# Patient Record
Sex: Male | Born: 1960
Health system: Southern US, Community
[De-identification: ages and names within clinical notes are randomized; demographics above are authoritative.]

## PROBLEM LIST (undated history)

## (undated) ENCOUNTER — Emergency Department (HOSPITAL_BASED_OUTPATIENT_CLINIC_OR_DEPARTMENT_OTHER): Disposition: A | Discharge status: Home / Self Care | Payer: No Typology Code available for payment source

## (undated) DIAGNOSIS — E119 Type 2 diabetes mellitus without complications: Secondary | ICD-10-CM

## (undated) DIAGNOSIS — M1990 Unspecified osteoarthritis, unspecified site: Secondary | ICD-10-CM

## (undated) DIAGNOSIS — K0889 Other specified disorders of teeth and supporting structures: Secondary | ICD-10-CM

## (undated) DIAGNOSIS — R51 Headache: Secondary | ICD-10-CM

## (undated) DIAGNOSIS — K219 Gastro-esophageal reflux disease without esophagitis: Secondary | ICD-10-CM

## (undated) DIAGNOSIS — I639 Cerebral infarction, unspecified: Secondary | ICD-10-CM

## (undated) DIAGNOSIS — D689 Coagulation defect, unspecified: Secondary | ICD-10-CM

## (undated) DIAGNOSIS — R519 Headache, unspecified: Secondary | ICD-10-CM

## (undated) DIAGNOSIS — I10 Essential (primary) hypertension: Secondary | ICD-10-CM

## (undated) HISTORY — DX: Type 2 diabetes mellitus without complications: E11.9

## (undated) HISTORY — DX: Coagulation defect, unspecified: D68.9

---

## 2012-11-11 DIAGNOSIS — I639 Cerebral infarction, unspecified: Secondary | ICD-10-CM

## 2012-11-11 HISTORY — DX: Cerebral infarction, unspecified: I63.9

## 2012-12-03 ENCOUNTER — Encounter (HOSPITAL_COMMUNITY): Payer: Self-pay | Admitting: Critical Care Medicine

## 2012-12-03 ENCOUNTER — Encounter (HOSPITAL_COMMUNITY): Payer: Self-pay | Admitting: *Deleted

## 2012-12-03 ENCOUNTER — Inpatient Hospital Stay (HOSPITAL_COMMUNITY): Payer: No Typology Code available for payment source

## 2012-12-03 ENCOUNTER — Inpatient Hospital Stay (HOSPITAL_COMMUNITY)
Admission: EM | Admit: 2012-12-03 | Discharge: 2012-12-17 | DRG: 020 | Disposition: A | Discharge status: Rehabilitation Facility | Payer: No Typology Code available for payment source | Attending: Neurosurgery | Admitting: Neurosurgery

## 2012-12-03 ENCOUNTER — Encounter (HOSPITAL_COMMUNITY)
Admission: EM | Disposition: A | Discharge status: Rehabilitation Facility | Payer: Self-pay | Source: Home / Self Care | Attending: Neurosurgery

## 2012-12-03 ENCOUNTER — Encounter (HOSPITAL_COMMUNITY): Payer: Self-pay | Admitting: Anesthesiology

## 2012-12-03 ENCOUNTER — Emergency Department (HOSPITAL_COMMUNITY): Payer: No Typology Code available for payment source

## 2012-12-03 ENCOUNTER — Inpatient Hospital Stay (HOSPITAL_COMMUNITY): Payer: No Typology Code available for payment source | Admitting: Critical Care Medicine

## 2012-12-03 DIAGNOSIS — J9600 Acute respiratory failure, unspecified whether with hypoxia or hypercapnia: Secondary | ICD-10-CM | POA: Diagnosis present

## 2012-12-03 DIAGNOSIS — J95821 Acute postprocedural respiratory failure: Secondary | ICD-10-CM | POA: Diagnosis not present

## 2012-12-03 DIAGNOSIS — E876 Hypokalemia: Secondary | ICD-10-CM | POA: Diagnosis not present

## 2012-12-03 DIAGNOSIS — F17200 Nicotine dependence, unspecified, uncomplicated: Secondary | ICD-10-CM | POA: Diagnosis present

## 2012-12-03 DIAGNOSIS — I619 Nontraumatic intracerebral hemorrhage, unspecified: Secondary | ICD-10-CM | POA: Diagnosis present

## 2012-12-03 DIAGNOSIS — D696 Thrombocytopenia, unspecified: Secondary | ICD-10-CM | POA: Diagnosis not present

## 2012-12-03 DIAGNOSIS — I10 Essential (primary) hypertension: Secondary | ICD-10-CM | POA: Diagnosis present

## 2012-12-03 DIAGNOSIS — I671 Cerebral aneurysm, nonruptured: Secondary | ICD-10-CM | POA: Diagnosis present

## 2012-12-03 DIAGNOSIS — R739 Hyperglycemia, unspecified: Secondary | ICD-10-CM | POA: Diagnosis not present

## 2012-12-03 DIAGNOSIS — I602 Nontraumatic subarachnoid hemorrhage from anterior communicating artery: Secondary | ICD-10-CM | POA: Diagnosis present

## 2012-12-03 DIAGNOSIS — I609 Nontraumatic subarachnoid hemorrhage, unspecified: Principal | ICD-10-CM | POA: Diagnosis present

## 2012-12-03 DIAGNOSIS — R4182 Altered mental status, unspecified: Secondary | ICD-10-CM | POA: Diagnosis not present

## 2012-12-03 DIAGNOSIS — R7309 Other abnormal glucose: Secondary | ICD-10-CM | POA: Diagnosis not present

## 2012-12-03 DIAGNOSIS — G910 Communicating hydrocephalus: Secondary | ICD-10-CM | POA: Diagnosis present

## 2012-12-03 DIAGNOSIS — E871 Hypo-osmolality and hyponatremia: Secondary | ICD-10-CM | POA: Diagnosis not present

## 2012-12-03 HISTORY — PX: RADIOLOGY WITH ANESTHESIA: SHX6223

## 2012-12-03 HISTORY — DX: Essential (primary) hypertension: I10

## 2012-12-03 LAB — COMPREHENSIVE METABOLIC PANEL
ALT: 18 U/L (ref 0–53)
AST: 21 U/L (ref 0–37)
Albumin: 3.9 g/dL (ref 3.5–5.2)
Alkaline Phosphatase: 91 U/L (ref 39–117)
BUN: 23 mg/dL (ref 6–23)
CO2: 23 meq/L (ref 19–32)
Calcium: 9.2 mg/dL (ref 8.4–10.5)
Chloride: 106 meq/L (ref 96–112)
Creatinine, Ser: 0.68 mg/dL (ref 0.50–1.35)
GFR calc Af Amer: >90 mL/min (ref >90)
GFR calc non Af Amer: >90 mL/min (ref >90)
Glucose, Bld: 157 mg/dL — ABNORMAL HIGH (ref 70–99)
Potassium: 3.9 meq/L (ref 3.5–5.1)
Sodium: 142 mEq/L (ref 135–145)
Total Bilirubin: 0.7 mg/dL (ref 0.3–1.2)
Total Protein: 6.8 g/dL (ref 6.0–8.3)

## 2012-12-03 LAB — CBC WITH DIFFERENTIAL/PLATELET
Basophils Absolute: 0.1 10*3/uL (ref 0.0–0.1)
Basophils Relative: 1 % (ref 0–1)
Eosinophils Absolute: 0.3 10*3/uL (ref 0.0–0.7)
Eosinophils Relative: 4 % (ref 0–5)
HCT: 53.6 % — ABNORMAL HIGH (ref 39.0–52.0)
Hemoglobin: 19.1 g/dL — ABNORMAL HIGH (ref 13.0–17.0)
Lymphocytes Relative: 26 % (ref 12–46)
Lymphs Abs: 2.4 10*3/uL (ref 0.7–4.0)
MCH: 33.3 pg (ref 26.0–34.0)
MCHC: 35.6 g/dL (ref 30.0–36.0)
MCV: 93.5 fL (ref 78.0–100.0)
Monocytes Absolute: 0.9 10*3/uL (ref 0.1–1.0)
Monocytes Relative: 10 % (ref 3–12)
Neutro Abs: 5.5 10*3/uL (ref 1.7–7.7)
Neutrophils Relative %: 59 % (ref 43–77)
Platelets: 164 10*3/uL (ref 150–400)
RBC: 5.73 MIL/uL (ref 4.22–5.81)
RDW: 13 % (ref 11.5–15.5)
WBC: 9.2 10*3/uL (ref 4.0–10.5)

## 2012-12-03 LAB — BLOOD GAS, ARTERIAL
Acid-Base Excess: 0.8 mmol/L (ref 0.0–2.0)
Bicarbonate: 26.9 mEq/L — ABNORMAL HIGH (ref 20.0–24.0)
Drawn by: 331001
FIO2: 1 %
MECHVT: 640 mL
O2 Saturation: 100 %
PEEP: 5 cmH2O
Patient temperature: 95
RATE: 14 {breaths}/min
TCO2: 28.7 mmol/L (ref 0–100)
pCO2 arterial: 54.4 mm[Hg] — ABNORMAL HIGH (ref 35.0–45.0)
pH, Arterial: 7.302 — ABNORMAL LOW (ref 7.350–7.450)
pO2, Arterial: 455 mm[Hg] — ABNORMAL HIGH (ref 80.0–100.0)

## 2012-12-03 LAB — PROTIME-INR
INR: 0.97 (ref 0.00–1.49)
Prothrombin Time: 12.8 s (ref 11.6–15.2)

## 2012-12-03 LAB — RAPID URINE DRUG SCREEN, HOSP PERFORMED
Amphetamines: NOT DETECTED
Barbiturates: NOT DETECTED
Benzodiazepines: NOT DETECTED
Cocaine: NOT DETECTED
Opiates: POSITIVE — AB
Tetrahydrocannabinol: NOT DETECTED

## 2012-12-03 LAB — URINE MICROSCOPIC-ADD ON

## 2012-12-03 LAB — POCT I-STAT, CHEM 8
BUN: 23 mg/dL (ref 6–23)
Calcium, Ion: 1.11 mmol/L — ABNORMAL LOW (ref 1.12–1.23)
Chloride: 108 mEq/L (ref 96–112)
Creatinine, Ser: 0.9 mg/dL (ref 0.50–1.35)
Glucose, Bld: 146 mg/dL — ABNORMAL HIGH (ref 70–99)
HCT: 57 % — ABNORMAL HIGH (ref 39.0–52.0)
Hemoglobin: 19.4 g/dL — ABNORMAL HIGH (ref 13.0–17.0)
Potassium: 3.9 meq/L (ref 3.5–5.1)
Sodium: 142 meq/L (ref 135–145)
TCO2: 29 mmol/L (ref 0–100)

## 2012-12-03 LAB — URINALYSIS, ROUTINE W REFLEX MICROSCOPIC
Glucose, UA: NEGATIVE mg/dL
Hgb urine dipstick: NEGATIVE
Ketones, ur: 15 mg/dL — AB
Nitrite: NEGATIVE
Protein, ur: 100 mg/dL — AB
Specific Gravity, Urine: 1.028 (ref 1.005–1.030)
Urobilinogen, UA: 1 mg/dL (ref 0.0–1.0)
pH: 7 (ref 5.0–8.0)

## 2012-12-03 LAB — MRSA PCR SCREENING: MRSA by PCR: NEGATIVE

## 2012-12-03 LAB — POCT I-STAT TROPONIN I: Troponin i, poc: 0 ng/mL (ref 0.00–0.08)

## 2012-12-03 LAB — APTT: aPTT: 25 seconds (ref 24–37)

## 2012-12-03 LAB — ETHANOL: Alcohol, Ethyl (B): <11 mg/dL (ref 0–11)

## 2012-12-03 LAB — GLUCOSE, CAPILLARY: Glucose-Capillary: 167 mg/dL — ABNORMAL HIGH (ref 70–99)

## 2012-12-03 SURGERY — RADIOLOGY WITH ANESTHESIA
Anesthesia: General

## 2012-12-03 MED ORDER — MIDAZOLAM HCL 2 MG/2ML IJ SOLN
INTRAMUSCULAR | Status: AC
  Filled 2012-12-03: qty 2

## 2012-12-03 MED ORDER — LABETALOL HCL 5 MG/ML IV SOLN
10.0000 mg | INTRAVENOUS | Status: DC | PRN
  Administered 2012-12-04 – 2012-12-06 (×3): 20 mg via INTRAVENOUS
  Filled 2012-12-03 (×3): qty 4

## 2012-12-03 MED ORDER — ACETAMINOPHEN 325 MG PO TABS: 650.0000 mg | ORAL_TABLET | ORAL | Status: DC | PRN

## 2012-12-03 MED ORDER — FENTANYL BOLUS VIA INFUSION
25.0000 ug | Freq: Four times a day (QID) | INTRAVENOUS | Status: DC | PRN
  Filled 2012-12-03: qty 100

## 2012-12-03 MED ORDER — ACETAMINOPHEN 650 MG RE SUPP
650.0000 mg | RECTAL | Status: DC | PRN
  Filled 2012-12-03: qty 1

## 2012-12-03 MED ORDER — ONDANSETRON HCL 4 MG/2ML IJ SOLN: 4.0000 mg | Freq: Four times a day (QID) | INTRAMUSCULAR | Status: DC | PRN

## 2012-12-03 MED ORDER — MORPHINE SULFATE 2 MG/ML IJ SOLN
1.0000 mg | INTRAMUSCULAR | Status: DC | PRN
  Administered 2012-12-04 – 2012-12-05 (×2): 2 mg via INTRAVENOUS
  Administered 2012-12-05: 4 mg via INTRAVENOUS
  Administered 2012-12-05: 2 mg via INTRAVENOUS
  Administered 2012-12-05 (×2): 4 mg via INTRAVENOUS
  Administered 2012-12-05: 2 mg via INTRAVENOUS
  Administered 2012-12-05: 4 mg via INTRAVENOUS
  Administered 2012-12-06 (×5): 2 mg via INTRAVENOUS
  Administered 2012-12-06: 4 mg via INTRAVENOUS
  Administered 2012-12-06: 2 mg via INTRAVENOUS
  Administered 2012-12-06: 4 mg via INTRAVENOUS
  Administered 2012-12-07 (×3): 2 mg via INTRAVENOUS
  Administered 2012-12-07: 4 mg via INTRAVENOUS
  Administered 2012-12-07: 2 mg via INTRAVENOUS
  Administered 2012-12-07: 4 mg via INTRAVENOUS
  Administered 2012-12-07 – 2012-12-08 (×3): 2 mg via INTRAVENOUS
  Administered 2012-12-08 (×2): 4 mg via INTRAVENOUS
  Administered 2012-12-09: 2 mg via INTRAVENOUS
  Administered 2012-12-09 (×2): 4 mg via INTRAVENOUS
  Administered 2012-12-09: 2 mg via INTRAVENOUS
  Filled 2012-12-03: qty 1
  Filled 2012-12-03: qty 2
  Filled 2012-12-03: qty 1
  Filled 2012-12-03: qty 2
  Filled 2012-12-03: qty 1
  Filled 2012-12-03 (×3): qty 2
  Filled 2012-12-03: qty 1
  Filled 2012-12-03: qty 2
  Filled 2012-12-03 (×10): qty 1
  Filled 2012-12-03: qty 2
  Filled 2012-12-03: qty 1
  Filled 2012-12-03: qty 2
  Filled 2012-12-03 (×2): qty 1
  Filled 2012-12-03 (×3): qty 2
  Filled 2012-12-03 (×2): qty 1
  Filled 2012-12-03 (×2): qty 2
  Filled 2012-12-03: qty 1

## 2012-12-03 MED ORDER — FENTANYL CITRATE 0.05 MG/ML IJ SOLN
INTRAMUSCULAR | Status: AC
  Administered 2012-12-03: 100 ug
  Filled 2012-12-03: qty 2

## 2012-12-03 MED ORDER — CHLORHEXIDINE GLUCONATE 0.12 % MT SOLN
15.0000 mL | Freq: Two times a day (BID) | OROMUCOSAL | Status: DC
  Administered 2012-12-03 – 2012-12-05 (×5): 15 mL via OROMUCOSAL
  Filled 2012-12-03 (×5): qty 15

## 2012-12-03 MED ORDER — SODIUM CHLORIDE 0.9 % IV SOLN
INTRAVENOUS | Status: DC | PRN
  Administered 2012-12-03 (×2): via INTRAVENOUS

## 2012-12-03 MED ORDER — LIDOCAINE HCL (CARDIAC) 20 MG/ML IV SOLN
INTRAVENOUS | Status: AC
  Filled 2012-12-03: qty 5

## 2012-12-03 MED ORDER — SODIUM CHLORIDE 0.9 % IV SOLN: INTRAVENOUS | Status: DC

## 2012-12-03 MED ORDER — ONDANSETRON HCL 4 MG/2ML IJ SOLN
4.0000 mg | Freq: Once | INTRAMUSCULAR | Status: AC
  Administered 2012-12-03: 4 mg via INTRAVENOUS
  Filled 2012-12-03: qty 2

## 2012-12-03 MED ORDER — LABETALOL HCL 5 MG/ML IV SOLN: 10.0000 mg | INTRAVENOUS | Status: DC | PRN

## 2012-12-03 MED ORDER — HEPARIN SODIUM (PORCINE) 1000 UNIT/ML IJ SOLN
INTRAMUSCULAR | Status: DC | PRN
  Administered 2012-12-03 (×2): 1000 [IU] via INTRAVENOUS

## 2012-12-03 MED ORDER — NIMODIPINE 30 MG PO CAPS
60.0000 mg | ORAL_CAPSULE | ORAL | Status: DC
  Administered 2012-12-04: 60 mg via ORAL
  Filled 2012-12-03 (×11): qty 2

## 2012-12-03 MED ORDER — MORPHINE SULFATE 4 MG/ML IJ SOLN
4.0000 mg | Freq: Once | INTRAMUSCULAR | Status: AC
  Administered 2012-12-03: 4 mg via INTRAVENOUS
  Filled 2012-12-03: qty 1

## 2012-12-03 MED ORDER — NITROGLYCERIN 1 MG/10 ML FOR IR/CATH LAB
INTRA_ARTERIAL | Status: AC
  Filled 2012-12-03: qty 10

## 2012-12-03 MED ORDER — ACETAMINOPHEN 650 MG RE SUPP: 650.0000 mg | Freq: Four times a day (QID) | RECTAL | Status: DC | PRN

## 2012-12-03 MED ORDER — PROPOFOL 10 MG/ML IV EMUL
5.0000 ug/kg/min | INTRAVENOUS | Status: DC
  Filled 2012-12-03: qty 100

## 2012-12-03 MED ORDER — SODIUM CHLORIDE 0.9 % IV SOLN
25.0000 ug/h | INTRAVENOUS | Status: DC
  Administered 2012-12-03: 25 ug/h via INTRAVENOUS
  Filled 2012-12-03: qty 50

## 2012-12-03 MED ORDER — PANTOPRAZOLE SODIUM 40 MG IV SOLR: 40.0000 mg | Freq: Every day | INTRAVENOUS | Status: DC

## 2012-12-03 MED ORDER — ACETAMINOPHEN 650 MG RE SUPP: 650.0000 mg | RECTAL | Status: DC | PRN

## 2012-12-03 MED ORDER — PROPOFOL 10 MG/ML IV EMUL
5.0000 ug/kg/min | INTRAVENOUS | Status: DC
  Administered 2012-12-03 – 2012-12-04 (×4): 35 ug/kg/min via INTRAVENOUS
  Filled 2012-12-03 (×4): qty 100

## 2012-12-03 MED ORDER — ACETAMINOPHEN 500 MG PO TABS: 1000.0000 mg | ORAL_TABLET | Freq: Four times a day (QID) | ORAL | Status: DC | PRN

## 2012-12-03 MED ORDER — SODIUM CHLORIDE 0.9 % IV SOLN
INTRAVENOUS | Status: DC
  Administered 2012-12-04: 20:00:00 via INTRAVENOUS
  Administered 2012-12-05: 1000 mL via INTRAVENOUS
  Administered 2012-12-05 – 2012-12-06 (×3): via INTRAVENOUS
  Administered 2012-12-06: 1000 mL via INTRAVENOUS
  Administered 2012-12-06 – 2012-12-11 (×8): via INTRAVENOUS
  Administered 2012-12-11: 1000 mL via INTRAVENOUS

## 2012-12-03 MED ORDER — SUCCINYLCHOLINE CHLORIDE 20 MG/ML IJ SOLN
INTRAMUSCULAR | Status: AC
  Administered 2012-12-03: 100 mg
  Filled 2012-12-03: qty 1

## 2012-12-03 MED ORDER — PROPOFOL 10 MG/ML IV EMUL
INTRAVENOUS | Status: AC
  Administered 2012-12-03: 10 ug/kg/min via INTRAVENOUS
  Filled 2012-12-03: qty 100

## 2012-12-03 MED ORDER — NITROGLYCERIN IN D5W 200-5 MCG/ML-% IV SOLN
INTRAVENOUS | Status: DC | PRN
  Administered 2012-12-03: 15 ug/min via INTRAVENOUS

## 2012-12-03 MED ORDER — NICARDIPINE HCL IN NACL 20-0.86 MG/200ML-% IV SOLN
5.0000 mg/h | INTRAVENOUS | Status: DC
  Administered 2012-12-03: 5 mg/h via INTRAVENOUS
  Administered 2012-12-03 (×2): 7.5 mg/h via INTRAVENOUS
  Administered 2012-12-04 (×3): 6 mg/h via INTRAVENOUS
  Administered 2012-12-04 (×3): 15 mg/h via INTRAVENOUS
  Filled 2012-12-03 (×10): qty 200

## 2012-12-03 MED ORDER — ROCURONIUM BROMIDE 50 MG/5ML IV SOLN
INTRAVENOUS | Status: AC
  Filled 2012-12-03: qty 2

## 2012-12-03 MED ORDER — PHENYLEPHRINE HCL 10 MG/ML IJ SOLN
INTRAMUSCULAR | Status: DC | PRN
  Administered 2012-12-03: 80 ug via INTRAVENOUS

## 2012-12-03 MED ORDER — SENNOSIDES-DOCUSATE SODIUM 8.6-50 MG PO TABS
1.0000 | ORAL_TABLET | Freq: Two times a day (BID) | ORAL | Status: DC
  Filled 2012-12-03: qty 1

## 2012-12-03 MED ORDER — NIMODIPINE 30 MG/ML ORAL SOLUTION
60.0000 mg | ORAL | Status: DC
  Administered 2012-12-03 – 2012-12-04 (×4): 60 mg
  Filled 2012-12-03 (×11): qty 2

## 2012-12-03 MED ORDER — ROCURONIUM BROMIDE 100 MG/10ML IV SOLN
INTRAVENOUS | Status: DC | PRN
  Administered 2012-12-03: 20 mg via INTRAVENOUS
  Administered 2012-12-03 (×2): 30 mg via INTRAVENOUS
  Administered 2012-12-03: 20 mg via INTRAVENOUS

## 2012-12-03 MED ORDER — BIOTENE DRY MOUTH MT LIQD
15.0000 mL | Freq: Four times a day (QID) | OROMUCOSAL | Status: DC
  Administered 2012-12-03 – 2012-12-06 (×10): 15 mL via OROMUCOSAL

## 2012-12-03 MED ORDER — PANTOPRAZOLE SODIUM 40 MG IV SOLR
40.0000 mg | Freq: Every day | INTRAVENOUS | Status: DC
  Administered 2012-12-03 – 2012-12-04 (×2): 40 mg via INTRAVENOUS
  Filled 2012-12-03 (×3): qty 40

## 2012-12-03 MED ORDER — MANNITOL 25 % IV SOLN
12.5000 g | INTRAVENOUS | Status: AC
  Filled 2012-12-03 (×2): qty 100

## 2012-12-03 MED ORDER — DEXAMETHASONE SODIUM PHOSPHATE 4 MG/ML IJ SOLN
4.0000 mg | Freq: Four times a day (QID) | INTRAMUSCULAR | Status: AC
  Administered 2012-12-03 – 2012-12-04 (×4): 4 mg via INTRAVENOUS
  Filled 2012-12-03 (×4): qty 1

## 2012-12-03 MED ORDER — SODIUM CHLORIDE 0.9 % IV SOLN
500.0000 mg | Freq: Two times a day (BID) | INTRAVENOUS | Status: DC
  Administered 2012-12-03 – 2012-12-07 (×9): 500 mg via INTRAVENOUS
  Filled 2012-12-03 (×10): qty 5

## 2012-12-03 MED ORDER — SODIUM CHLORIDE 0.9 % IV SOLN: 25.0000 ug/h | INTRAVENOUS | Status: DC

## 2012-12-03 MED ORDER — FENTANYL CITRATE 0.05 MG/ML IJ SOLN
INTRAMUSCULAR | Status: AC
  Filled 2012-12-03: qty 2

## 2012-12-03 MED ORDER — SENNOSIDES-DOCUSATE SODIUM 8.6-50 MG PO TABS
1.0000 | ORAL_TABLET | Freq: Two times a day (BID) | ORAL | Status: DC
  Administered 2012-12-03 – 2012-12-17 (×18): 1 via ORAL
  Filled 2012-12-03 (×29): qty 1

## 2012-12-03 MED ORDER — FENTANYL CITRATE 0.05 MG/ML IJ SOLN
INTRAMUSCULAR | Status: DC | PRN
  Administered 2012-12-03 (×3): 50 ug via INTRAVENOUS
  Administered 2012-12-03: 150 ug via INTRAVENOUS
  Administered 2012-12-03: 100 ug via INTRAVENOUS
  Administered 2012-12-03 (×2): 50 ug via INTRAVENOUS

## 2012-12-03 MED ORDER — ETOMIDATE 2 MG/ML IV SOLN
INTRAVENOUS | Status: AC
  Administered 2012-12-03: 20 mg
  Filled 2012-12-03: qty 20

## 2012-12-03 MED ORDER — HYDROMORPHONE HCL PF 1 MG/ML IJ SOLN: 0.2500 mg | INTRAMUSCULAR | Status: DC | PRN

## 2012-12-03 MED ORDER — ONDANSETRON HCL 4 MG/2ML IJ SOLN
4.0000 mg | Freq: Four times a day (QID) | INTRAMUSCULAR | Status: DC | PRN
  Administered 2012-12-04 – 2012-12-05 (×2): 4 mg via INTRAVENOUS
  Filled 2012-12-03 (×2): qty 2

## 2012-12-03 MED ORDER — IOHEXOL 300 MG/ML  SOLN
150.0000 mL | Freq: Once | INTRAMUSCULAR | Status: AC | PRN
  Administered 2012-12-03: 180 mL via INTRA_ARTERIAL

## 2012-12-03 NOTE — Progress Notes (Addendum)
VASCULAR LAB PRELIMINARY  PRELIMINARY  PRELIMINARY  PRELIMINARY  Transcranial Doppler  Date POD PCO2 HCT BP  MCA ACA PCA OPHT SIPH VERT Basilar  5-23- 14 SB     Right  Left   49  47   -64  -47   31  31   20  19   15  25    -23  --     --    5-26- 14 SB     Right  Left   74  87   -96  -84   57/45  35   32  23   32  42   -21  -33     -60         Right  Left                                             Right  Left                                             Right  Left                                            Right  Left                                            Right  Left                                        MCA = Middle Cerebral Artery      OPHT = Opthalmic Artery     BASILAR = Basilar Artery   ACA = Anterior Cerebral Artery     SIPH = Carotid Siphon PCA = Posterior Cerebral Artery   VERT = Verterbral Artery                   Normal MCA = 62+\-12 ACA = 50+\-12 PCA = 42+\-23         Camaya [employer], RVT 12/03/2012, 3:56 PM

## 2012-12-03 NOTE — Progress Notes (Signed)
Nursing 1730 Dr. Marchelle Gearing made aware that patient's OG tube is coiled.  Order to remove and replace.  OG tube pulled out and replaced.  RN auscultated OG tube and ordered KUB to confirm per Dr. Marchelle Gearing.  Will wait for results.

## 2012-12-03 NOTE — ED Notes (Signed)
2nd call to IR- per RN awaiting input from neuro doc before pt arrival to IR.  They will call me when ready.

## 2012-12-03 NOTE — Progress Notes (Signed)
Unit CM UR Completed by [facility] ED CM  W. Kristiann Noyce RN  

## 2012-12-03 NOTE — ED Notes (Signed)
Patient presents by EMS for syncopal episode.  Was very diaphoretic with c/o right leg numbness at the scene but none at this time.  BS 132

## 2012-12-03 NOTE — ED Notes (Signed)
Arrive IR.

## 2012-12-03 NOTE — Progress Notes (Signed)
Nursing 1700 Dr. Franky Macho made aware of patient's current neuro assessment, order to keep patient sedated overnight.

## 2012-12-03 NOTE — Progress Notes (Signed)
eLink Physician-Brief Progress Note Patient Name: Dennis Wyatt DOB: May 26, 1961 MRN: [id number]  Date of Service  12/03/2012   HPI/Events of Note   kub shows ng tube in fundus of stomach  eICU Interventions  Ok to use new ng tube   Intervention Category Minor Interventions: Other:  Erikah Thumm 12/03/2012, 7:39 PM

## 2012-12-03 NOTE — ED Notes (Signed)
Pt brother is Giovany Cosby- [phone].  This rn advised brother that pt was in hospital.  Brother stated he was inbound from [facility] [place].

## 2012-12-03 NOTE — H&P (Signed)
Referring Physician:Dr. Marikay Alar HPI: Dennis Wyatt is an 52 y.o. male who was at work today with sudden onset of headache. Details of the history are limited as he is now intubated and unresponsive. He was brought to ER by EMS and no other witnesses or family are present. CT finds SAH with concern for ruptured aneurysm Neurosurgery has seen pt and has asked IR to perform urgent cerebral angiogram and possible intervention. PMHx notable for uncontrolled HTN with BP on arrival of 204/123   Past Medical History:  Past Medical History  Diagnosis Date  . Hypertension     Past Surgical History: History reviewed. No pertinent past surgical history.  Family History: History reviewed. No pertinent family history.  Social History:  reports that he has been smoking.  He does not have any smokeless tobacco history on file. He reports that  drinks alcohol. He reports that he does not use illicit drugs.  Allergies: No Known Allergies  Medications: Unknown  Please HPI for pertinent positives, otherwise complete 10 system ROS negative.  Physical Exam: BP 170/88  Pulse 70  Temp(Src) 98.4 F (36.9 C) (Oral)  Resp 20  Ht 6\' 1"  (1.854 m)  Wt 300 lb (136.079 kg)  BMI 39.59 kg/m2  SpO2 94% Body mass index is 39.59 kg/(m^2).   General Appearance:  Alert, cooperative, no distress, appears stated age  Head:  Normocephalic, without obvious abnormality, atraumatic  ENT: Unremarkable  Neck: Supple, symmetrical, trachea midline  Lungs:   Clear to auscultation bilaterally, diminished at bases  Chest Wall:  No tenderness or deformity  Heart:  Bradycardic but regular  Pulses: 2+ and symmetric femoral and pedal  Neurologic: Unresponsive on vent   Results for orders placed during the hospital encounter of 12/03/12 (from the past 48 hour(s))  CBC WITH DIFFERENTIAL     Status: Abnormal   Collection Time    12/03/12  7:10 AM      Result Value Range   WBC 9.2  4.0 - 10.5 K/uL   RBC 5.73  4.22 -  5.81 MIL/uL   Hemoglobin 19.1 (*) 13.0 - 17.0 g/dL   HCT 16.1 (*) 09.6 - 04.5 %   MCV 93.5  78.0 - 100.0 fL   MCH 33.3  26.0 - 34.0 pg   MCHC 35.6  30.0 - 36.0 g/dL   RDW 40.9  81.1 - 91.4 %   Platelets 164  150 - 400 K/uL   Neutrophils Relative % 59  43 - 77 %   Neutro Abs 5.5  1.7 - 7.7 K/uL   Lymphocytes Relative 26  12 - 46 %   Lymphs Abs 2.4  0.7 - 4.0 K/uL   Monocytes Relative 10  3 - 12 %   Monocytes Absolute 0.9  0.1 - 1.0 K/uL   Eosinophils Relative 4  0 - 5 %   Eosinophils Absolute 0.3  0.0 - 0.7 K/uL   Basophils Relative 1  0 - 1 %   Basophils Absolute 0.1  0.0 - 0.1 K/uL  COMPREHENSIVE METABOLIC PANEL     Status: Abnormal   Collection Time    12/03/12  7:10 AM      Result Value Range   Sodium 142  135 - 145 mEq/L   Potassium 3.9  3.5 - 5.1 mEq/L   Chloride 106  96 - 112 mEq/L   CO2 23  19 - 32 mEq/L   Glucose, Bld 157 (*) 70 - 99 mg/dL   BUN 23  6 -  23 mg/dL   Creatinine, Ser 1.47  0.50 - 1.35 mg/dL   Calcium 9.2  8.4 - 82.9 mg/dL   Total Protein 6.8  6.0 - 8.3 g/dL   Albumin 3.9  3.5 - 5.2 g/dL   AST 21  0 - 37 U/L   ALT 18  0 - 53 U/L   Alkaline Phosphatase 91  39 - 117 U/L   Total Bilirubin 0.7  0.3 - 1.2 mg/dL   GFR calc non Af Amer >90  >90 mL/min   GFR calc Af Amer >90  >90 mL/min   Comment:            The eGFR has been calculated     using the CKD EPI equation.     This calculation has not been     validated in all clinical     situations.     eGFR's persistently     <90 mL/min signify     possible Chronic Kidney Disease.  ETHANOL     Status: None   Collection Time    12/03/12  7:10 AM      Result Value Range   Alcohol, Ethyl (B) <11  0 - 11 mg/dL   Comment:            LOWEST DETECTABLE LIMIT FOR     SERUM ALCOHOL IS 11 mg/dL     FOR MEDICAL PURPOSES ONLY  POCT I-STAT TROPONIN I     Status: None   Collection Time    12/03/12  7:32 AM      Result Value Range   Troponin i, poc 0.00  0.00 - 0.08 ng/mL   Comment 3            Comment: Due to  the release kinetics of cTnI,     a negative result within the first hours     of the onset of symptoms does not rule out     myocardial infarction with certainty.     If myocardial infarction is still suspected,     repeat the test at appropriate intervals.  POCT I-STAT, CHEM 8     Status: Abnormal   Collection Time    12/03/12  7:33 AM      Result Value Range   Sodium 142  135 - 145 mEq/L   Potassium 3.9  3.5 - 5.1 mEq/L   Chloride 108  96 - 112 mEq/L   BUN 23  6 - 23 mg/dL   Creatinine, Ser 5.62  0.50 - 1.35 mg/dL   Glucose, Bld 130 (*) 70 - 99 mg/dL   Calcium, Ion 8.65 (*) 1.12 - 1.23 mmol/L   TCO2 29  0 - 100 mmol/L   Hemoglobin 19.4 (*) 13.0 - 17.0 g/dL   HCT 78.4 (*) 69.6 - 29.5 %   Ct Head Wo Contrast  12/03/2012   *RADIOLOGY REPORT*  Clinical Data: Severe headache.  Hypertension.  CT HEAD WITHOUT CONTRAST  Technique:  Contiguous axial images were obtained from the base of the skull through the vertex without contrast.  Comparison: None.  Findings: Large subarachnoid hemorrhage.  There is extensive subarachnoid hemorrhage around the basilar cisterns and into the sylvian fissure bilaterally.  There is subarachnoid blood over the convexity.  There is a large clot in the anterior septum pellucidum extending into the right inferior frontal lobe. There is also intraventricular hemorrhage.  There is  developing hydrocephalus with mild enlargement the ventricles and effacement of the subarachnoid space.  Fourth  ventricle appears compressed due to increased intracranial pressure.  No acute infarct identified.  IMPRESSION: Large subarachnoid hemorrhage.  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 There is a parenchymal component to the hemorrhage in the right inferior frontal lobe.  There is blood in the anterior interventricular septum with extension into the ventricles.  There is developing hydrocephalus and increased intracranial pressure.   Critical Value/emergent results were called by telephone at the time of interpretation on 12/03/2012 at 0735 hours to Dr. Renae Gloss, who verbally acknowledged these results.   Original Report Authenticated By: Janeece Riggers, M.D.    Assessment/Plan Acute SAH Possible ruptured aneursym To IR for angiogram and possible coil embo vs need for OR clipping Coags pending No family or contact info available, needs emergent Angiogram, Consent signed as emergent by myself Reporting to Dr. Harlene Ramus, [facility] PA-C 12/03/2012, 8:39 AM

## 2012-12-03 NOTE — ED Notes (Signed)
Dr Franky Macho at bedside to place ventric. CRNA at bedside to assumed care of pt

## 2012-12-03 NOTE — Anesthesia Preprocedure Evaluation (Deleted)
Anesthesia Evaluation  Patient identified by MRN, date of birth, ID band  Reviewed: Unable to perform ROS - Chart review only  Airway       Dental   Pulmonary    - wheezing (Pt already intubated on arrival.)      Cardiovascular     Neuro/Psych    GI/Hepatic   Endo/Other    Renal/GU      Musculoskeletal   Abdominal   Peds  Hematology   Anesthesia Other Findings   Reproductive/Obstetrics                           Anesthesia Physical Anesthesia Plan  ASA: III and emergent  Anesthesia Plan: General   Post-op Pain Management:    Induction: Intravenous  Airway Management Planned: Oral ETT  Additional Equipment: Arterial line  Intra-op Plan:   Post-operative Plan: Possible Post-op intubation/ventilation  Informed Consent:   Plan Discussed with: CRNA, Surgeon and Anesthesiologist  Anesthesia Plan Comments:         Anesthesia Quick Evaluation

## 2012-12-03 NOTE — Anesthesia Preprocedure Evaluation (Signed)
Anesthesia Evaluation  Patient identified by MRN, date of birth, ID band  Airway Mallampati: II      Dental   Pulmonary          Cardiovascular     Neuro/Psych    GI/Hepatic   Endo/Other    Renal/GU      Musculoskeletal   Abdominal   Peds  Hematology   Anesthesia Other Findings   Reproductive/Obstetrics                           Anesthesia Physical Anesthesia Plan  ASA: IV  Anesthesia Plan:    Post-op Pain Management:    Induction:   Airway Management Planned:   Additional Equipment:   Intra-op Plan:   Post-operative Plan:   Informed Consent:   Plan Discussed with:   Anesthesia Plan Comments:         Anesthesia Quick Evaluation

## 2012-12-03 NOTE — ED Notes (Signed)
While talking with the patient he became very diaphoretic, did not respond or follow directions.

## 2012-12-03 NOTE — ED Notes (Signed)
Brought to trauma A pt aa and states his head hurts 0813 pt given 20mg   Etomidate, 0814 100 succ 0816  Pt intubated w/ # 8 et tube , 24 at the lip, dr Bernette Mayers and resp 98 % sat 0816 sat 98 % good color change has good bilateral BS cxray called for  Pt dropped heart rate to 36 given 1/2 amp atropine per v/o dr Bernette Mayers, propofal drip started at 5 after bolus of 50,

## 2012-12-03 NOTE — Procedures (Signed)
S/P 4 vessel cerebral arteriogram. RT CFA approach  Findings 1.Approx 11mm x 7 mm irregular RT ACA A1- Acom aneurysm. S/P primary coiling .

## 2012-12-03 NOTE — ED Notes (Signed)
Pt intubated and sedated. Propofol infusing at 30.3 mcg/kg/minute and Fentanyl infusing at 142mcg/hr.

## 2012-12-03 NOTE — ED Provider Notes (Signed)
History     CSN: [id number]  Arrival date & time 12/03/12  7829   None     Chief Complaint  Patient presents with  . Loss of Consciousness    (Consider location/radiation/quality/duration/timing/severity/associated sxs/prior treatment) HPI Comments: Pt with h/o HTN, not on meds for several years. 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 States he was doing precheck and felt dizzy. Unsure if he hit his head when he fell. Does not remember many details surrounding the incident. Supervisor called EMS. He was sweating heavily and had numbness in R leg that is now resolved. Also c/o HA. Denies CP, shortness of breath, weakness in arms/legs. Hypertensive on EMS arrival. CBG was 132. Level 5 caveat due to altered LOC upon arrival. H/o alcohol use, smoking.  Patient is a 52 y.o. male presenting with syncope. The history is provided by the patient.  Loss of Consciousness Associated symptoms: diaphoresis and headaches   Associated symptoms: no weakness     Past Medical History  Diagnosis Date  . Hypertension     History reviewed. No pertinent past surgical history.  History reviewed. No pertinent family history.  History  Substance Use Topics  . Smoking status: Current Every Day Smoker -- 1.50 packs/day  . Smokeless tobacco: Not on file  . Alcohol Use: Yes      Review of Systems  Unable to perform ROS: Mental status change  Constitutional: Positive for diaphoresis.  Cardiovascular: Positive for syncope.  Neurological: Positive for syncope, light-headedness, numbness and headaches. Negative for weakness.    Allergies  Review of patient's allergies indicates no known allergies.  Home Medications  No current outpatient prescriptions on file.  BP 204/123  Pulse 82  Temp(Src) 98.4 F (36.9 C) (Oral)  Resp 14  Ht 6\' 1"  (1.854 m)  Wt 300 lb (136.079 kg)  BMI 39.59 kg/m2  SpO2 98%  Physical Exam  Nursing note and vitals reviewed. Constitutional: He is  oriented to person, place, and time. He appears well-developed and well-nourished.  HENT:  Head: Normocephalic and atraumatic.  Right Ear: Tympanic membrane, external ear and ear canal normal.  Left Ear: Tympanic membrane, external ear and ear canal normal.  Nose: Nose normal.  Mouth/Throat: Uvula is midline, oropharynx is clear and moist and mucous membranes are normal.  Eyes: Conjunctivae, EOM and lids are normal. Pupils are equal, round, and reactive to light.  Neck: Normal range of motion. Neck supple.  Cardiovascular: Normal rate and regular rhythm.   Occasional ectopy  Pulmonary/Chest: Effort normal and breath sounds normal.  Abdominal: Soft. Bowel sounds are normal. There is no tenderness. There is no rebound and no guarding.  Musculoskeletal: Normal range of motion.       Cervical back: He exhibits normal range of motion, no tenderness and no bony tenderness.  Neurological: He is alert and oriented to person, place, and time. He has normal strength. No cranial nerve deficit or sensory deficit. He exhibits normal muscle tone. GCS eye subscore is 4. GCS verbal subscore is 5. GCS motor subscore is 6.  AAOx3 when baseline. CN III-XII grossly intact. Moves extremities purposefully when asked.   Skin: Skin is warm and dry.  Psychiatric: He has a normal mood and affect.    ED Course  Procedures (including critical care time)  Labs Reviewed  CBC WITH DIFFERENTIAL - Abnormal; Notable for the following:    Hemoglobin 19.1 (*)    HCT 53.6 (*)    All other components within normal limits  COMPREHENSIVE METABOLIC PANEL - Abnormal; Notable for the following:    Glucose, Bld 157 (*)    All other components within normal limits  POCT I-STAT, CHEM 8 - Abnormal; Notable for the following:    Glucose, Bld 146 (*)    Calcium, Ion 1.11 (*)    Hemoglobin 19.4 (*)    HCT 57.0 (*)    All other components within normal limits  ETHANOL  URINALYSIS, ROUTINE W REFLEX MICROSCOPIC  URINE RAPID DRUG  SCREEN (HOSP PERFORMED)  PROTIME-INR  APTT  POCT I-STAT TROPONIN I   Ct Head Wo Contrast  12/03/2012   *RADIOLOGY REPORT*  Clinical Data: Severe headache.  Hypertension.  CT HEAD WITHOUT CONTRAST  Technique:  Contiguous axial images were obtained from the base of the skull through the vertex without contrast.  Comparison: None.  Findings: Large subarachnoid hemorrhage.  There is extensive subarachnoid hemorrhage around the basilar cisterns and into the sylvian fissure bilaterally.  There is subarachnoid blood over the convexity.  There is a large clot in the anterior septum pellucidum extending into the right inferior frontal lobe. There is also intraventricular hemorrhage.  There is  developing hydrocephalus with mild enlargement the ventricles and effacement of the subarachnoid space.  Fourth ventricle appears compressed due to increased intracranial pressure.  No acute infarct identified.  IMPRESSION: Large subarachnoid hemorrhage.  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 There is a parenchymal component to the hemorrhage in the right inferior frontal lobe.  There is blood in the anterior interventricular septum with extension into the ventricles.  There is developing hydrocephalus and increased intracranial pressure.  Critical Value/emergent results were called by telephone at the time of interpretation on 12/03/2012 at 0735 hours to Dr. Renae Gloss, who verbally acknowledged these results.   Original Report Authenticated By: Janeece Riggers, M.D.     1. Subarachnoid hemorrhage     7:02 AM Was called to room by nurse. Patient totally responsive on arrival and became diaphoretic and minimally responsive for approx 5 minutes. Then back to baseline. Only complaint is headache. Patient seen and examined. Work-up initiated. Medications ordered.   Vital signs reviewed and are as follows: Filed Vitals:   12/03/12 0659  BP: 204/123  Pulse: 82  Temp:  98.4 F (36.9 C)  Resp: 14    Date: 12/03/2012  Rate: 90  Rhythm: normal sinus rhythm  QRS Axis: right  Intervals: normal  ST/T Wave abnormalities: normal  Conduction Disutrbances:right bundle branch block and left anterior fascicular block  Narrative Interpretation:   Old EKG Reviewed: none available  BP down from 204/123 to 170/88. Will hold antihypertensives at this time.   7:17 AM Exam stable on recheck. Asked nurse to expedite head CT. D/w Dr. Bernette Mayers.   7:20 AM Patient to CT.   7:43 AM Exam stable. Patient seen with Dr. Bernette Mayers. I have spoken with Dr. Yetta Barre of neurosurgery who will see in ED.   7:56 AM Neurosurgery at bedside.    BP 170/88  Pulse 70  Temp(Src) 98.4 F (36.9 C) (Oral)  Resp 20  Ht 6\' 1"  (1.854 m)  Wt 300 lb (136.079 kg)  BMI 39.59 kg/m2  SpO2 94%  CRITICAL CARE Performed by: Carolee Rota Total critical care time: 50 Critical care time was exclusive of separately billable procedures and treating other patients. Critical care was necessary to treat or prevent imminent or life-threatening deterioration. Critical care was time spent personally by me on the following activities: development of  treatment plan with patient and/or surrogate as well as nursing, discussions with consultants, evaluation of patient's response to treatment, examination of patient, obtaining history from patient or surrogate, ordering and performing treatments and interventions, ordering and review of laboratory studies, ordering and review of radiographic studies, pulse oximetry and re-evaluation of patient's condition.     MDM  Subarachnoid hemorrhage.         Renne Crigler, PA-C 12/03/12 0813  Renne Crigler, PA-C 12/03/12 4098

## 2012-12-03 NOTE — H&P (Signed)
Reason for Consult:SAH Referring Physician: EDP  Dennis Wyatt is an 52 y.o. male.   HPI:  52 year old gentleman who was at work today with sudden onset of headache. Details of the history are sketchy as he is awake but unable to remember the details of the history. He was brought to the emergency department where head CT showed a large subarachnoid hemorrhage with some intercerebral hemorrhage and neurosurgical evaluation was requested. Complains of headache. He denies double vision. He's had no nausea vomiting. He has been awake but is starting to repeat some of this questions according to nursing.  Past Medical History  Diagnosis Date  . Hypertension     History reviewed. No pertinent past surgical history.  No Known Allergies  History  Substance Use Topics  . Smoking status: Current Every Day Smoker -- 1.50 packs/day  . Smokeless tobacco: Not on file  . Alcohol Use: Yes    History reviewed. No pertinent family history.   Review of Systems  Positive ROS: Unable to obtain  All other systems have been reviewed and were otherwise negative with the exception of those mentioned in the HPI and as above.  Objective: Vital signs in last 24 hours: Temp:  [98.4 F (36.9 C)] 98.4 F (36.9 C) (05/23 0659) Pulse Rate:  [70-82] 70 (05/23 0700) Resp:  [14-20] 20 (05/23 0700) BP: (170-204)/(88-123) 170/88 mmHg (05/23 0700) SpO2:  [94 %-98 %] 94 % (05/23 0700) Weight:  [136.079 kg (300 lb)] 136.079 kg (300 lb) (05/23 0659)  General Appearance: Mildly lethargic, opens eyes to voice and follows commands, appears stated age Head: Normocephalic, without obvious abnormality, atraumatic Eyes: PERRL, conjunctiva/corneas mildly injected, EOM's intact     Neck: Somewhat rigid, trachea midline Lungs:respirations unlabored Heart: Regular rate and rhythm Abdomen: Soft Extremities: Extremities normal, atraumatic, no cyanosis or edema Pulses: 2+ and symmetric all extremities Skin: Skin color,  texture, turgor normal, no rashes or lesions  NEUROLOGIC:   Mental status: A&O x4, no aphasia, fair attention span, Memory and fund of knowledge somewhat limited Motor Exam - grossly normal, normal tone and bulk Sensory Exam - grossly normal Reflexes: symmetric, no pathologic reflexes, No Hoffman's, No clonus Coordination - unable to test adequately Gait - unable to test Balance - unable to determine Cranial Nerves: I: smell Not tested  II: visual acuity  OS: na    OD: na  II: visual fields Full to confrontation  II: pupils Equal, round, reactive to light  III,VII: ptosis None  III,IV,VI: extraocular muscles  Full ROM  V: mastication Normal  V: facial light touch sensation  Normal  V,VII: corneal reflex  Present  VII: facial muscle function - upper  Normal  VII: facial muscle function - lower Normal  VIII: hearing Not tested  IX: soft palate elevation  Normal  IX,X: gag reflex Present  XI: trapezius strength  5/5  XI: sternocleidomastoid strength 5/5  XI: neck flexion strength  5/5  XII: tongue strength  Normal    Data Review Lab Results  Component Value Date   WBC 9.2 12/03/2012   HGB 19.4* 12/03/2012   HCT 57.0* 12/03/2012   MCV 93.5 12/03/2012   PLT 164 12/03/2012   Lab Results  Component Value Date   NA 142 12/03/2012   K 3.9 12/03/2012   CL 108 12/03/2012   CO2 23 12/03/2012   BUN 23 12/03/2012   CREATININE 0.90 12/03/2012   GLUCOSE 146* 12/03/2012   No results found for this basename: INR, PROTIME  Radiology: Ct Head Wo Contrast  12/03/2012   *RADIOLOGY REPORT*  Clinical Data: Severe headache.  Hypertension.  CT HEAD WITHOUT CONTRAST  Technique:  Contiguous axial images were obtained from the base of the skull through the vertex without contrast.  Comparison: None.  Findings: Large subarachnoid hemorrhage.  There is extensive subarachnoid hemorrhage around the basilar cisterns and into the sylvian fissure bilaterally.  There is subarachnoid blood over the convexity.   There is a large clot in the anterior septum pellucidum extending into the right inferior frontal lobe. There is also intraventricular hemorrhage.  There is  developing hydrocephalus with mild enlargement the ventricles and effacement of the subarachnoid space.  Fourth ventricle appears compressed due to increased intracranial pressure.  No acute infarct identified.  IMPRESSION: Large subarachnoid hemorrhage.  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 There is a parenchymal component to the hemorrhage in the right inferior frontal lobe.  There is blood in the anterior interventricular septum with extension into the ventricles.  There is developing hydrocephalus and increased intracranial pressure.  Critical Value/emergent results were called by telephone at the time of interpretation on 12/03/2012 at 0735 hours to Dr. Renae Gloss, who verbally acknowledged these results.   Original Report Authenticated By: Janeece Riggers, M.D.   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 He will go to interventional radiology for four-vessel cerebral angiogram. We'll need to be intubated prior to this because this is either going to need to be coiled or clipped. I have spoken to our neurovascular surgeon, Dr. Franky Macho, and he is agreed to take care of this patient. He has already seen the films. He will discuss coiling versus clipping once the angiogram is performed. I have tried to discuss this with the patient but I'm not sure that he understands all of the implications involved with this decision making.    Dennis Wyatt S 12/03/2012 8:00 AM

## 2012-12-03 NOTE — Transfer of Care (Signed)
Immediate Anesthesia Transfer of Care Note  Patient: Dennis Wyatt  Procedure(s) Performed: * No procedures listed *  Patient Location: ICU  Anesthesia Type:General  Level of Consciousness: Patient remains intubated per anesthesia plan  Airway & Oxygen Therapy: Patient remains intubated per anesthesia plan and Patient placed on Ventilator (see vital sign flow sheet for setting)  Post-op Assessment: Report given to PACU RN and Post -op Vital signs reviewed and stable  Post vital signs: Reviewed and stable  Complications: No apparent anesthesia complications

## 2012-12-03 NOTE — Consult Note (Signed)
PULMONARY  / CRITICAL CARE MEDICINE  Name: Dennis Wyatt MRN: [id number] DOB: 09/08/1960    ADMISSION DATE:  12/03/2012 CONSULTATION DATE:  12/03/2012  REFERRING MD :  Dr. Yetta Barre PRIMARY SERVICE: Neuro surg  CHIEF COMPLAINT:  VDRF  BRIEF PATIENT DESCRIPTION: 52 year old male who was at work when noticed headache and co-workers noted change in his mental status.  Patient was taken to the ED where he was noted to have an aneurysm with SAH.  Patient was taken to IR, intubated and aneurysm was coiled.  Patient was brought to the ICU post coiling intubated and PCCM was asked to see on consultation.  SIGNIFICANT EVENTS / STUDIES:  5/23 S/P coiling of aneurysm.  LINES / TUBES: PIV ET tube 5/23>>>  CULTURES: None  ANTIBIOTICS: Surgical prophylaxis only.  PAST MEDICAL HISTORY :  Past Medical History  Diagnosis Date  . Hypertension    History reviewed. No pertinent past surgical history. Prior to Admission medications   Medication Sig Start Date End Date Taking? Authorizing Provider  PRESCRIPTION MEDICATION Take 1 tablet by mouth daily. Blood Pressure   Yes Historical Provider, MD   No Known Allergies  FAMILY HISTORY:  History reviewed. No pertinent family history. SOCIAL HISTORY:  reports that he has been smoking.  He does not have any smokeless tobacco history on file. He reports that  drinks alcohol. He reports that he does not use illicit drugs.  REVIEW OF SYSTEMS:  Unattainable, patient is sedated and intubated.  SUBJECTIVE: None, sedated and intubated.  VITAL SIGNS: Temp:  [93 F (33.9 C)-98.4 F (36.9 C)] 95 F (35 C) (05/23 1500) Pulse Rate:  [45-82] 56 (05/23 1500) Resp:  [13-20] 14 (05/23 1500) BP: (94-211)/(42-123) 94/42 mmHg (05/23 1500) SpO2:  [94 %-100 %] 100 % (05/23 1500) Arterial Line BP: (129)/(60) 129/60 mmHg (05/23 1500) FiO2 (%):  [100 %] 100 % (05/23 1500) Weight:  [115.1 kg (253 lb 12 oz)-136.079 kg (300 lb)] 115.1 kg (253 lb 12 oz) (05/23  1500) HEMODYNAMICS:   VENTILATOR SETTINGS: Vent Mode:  [-] PRVC FiO2 (%):  [100 %] 100 % Set Rate:  [14 bmp] 14 bmp Vt Set:  [640 mL] 640 mL PEEP:  [5 cmH20] 5 cmH20 Plateau Pressure:  [16 cmH20] 16 cmH20 INTAKE / OUTPUT: Intake/Output     05/22 0701 - 05/23 0700 05/23 0701 - 05/24 0700   I.V. (mL/kg)  1320.8 (11.5)   Total Intake(mL/kg)  1320.8 (11.5)   Urine (mL/kg/hr)  520 (0.5)   Blood  100 (0.1)   Total Output   620   Net   +700.8          PHYSICAL EXAMINATION: General:  Well appearing, sedated and vented. Neuro:  Sedated but withdraws all ext to pain. HEENT:  [place]/AT, PERRL, EOM-I and -LAN/Thyromegally. Cardiovascular:  RRR, Nl S1/S2, -M/R/G. Lungs:  CTA bilaterally. Abdomen:  Soft, NT, ND and +BS. Musculoskeletal:  -edema and -tenderness. Skin:  Intact.  LABS:  Recent Labs Lab 12/03/12 0710 12/03/12 0733  HGB 19.1* 19.4*  WBC 9.2  --   PLT 164  --   NA 142 142  K 3.9 3.9  CL 106 108  CO2 23  --   GLUCOSE 157* 146*  BUN 23 23  CREATININE 0.68 0.90  CALCIUM 9.2  --   AST 21  --   ALT 18  --   ALKPHOS 91  --   BILITOT 0.7  --   PROT 6.8  --   ALBUMIN 3.9  --  APTT 25  --   INR 0.97  --     Recent Labs Lab 12/03/12 0901  GLUCAP 167*    CXR: Pending  ASSESSMENT / PLAN:  PULMONARY A: VDRF post SAH and aneurysm coiling. P:   - CXR and ABG now. - Adjust vent pending results. - AM CXR and ABG. - Wean once stable by NS.  CARDIOVASCULAR A: HTN history, BP varies widely at this time. P:  - Nimodipine oral solution as ordered. - Propofol. - If remains hypertensive may need cardene drip for BP control.  RENAL A:  No active issues. P:   - BMET in AM and replace electrolytes as needed.  GASTROINTESTINAL A:  No active issues. P:   - If remains intubated by AM will consult nutrition for TF.  HEMATOLOGIC A:  Elevated Hg, likely related to dehydration.  No history of polycythemia. P:  - IV hydration. - Recheck in  AM.  INFECTIOUS A:  No active issues. P:   - Surgical prophylaxis only.  ENDOCRINE A:  No diabetes history.   P:   - TF in AM if remains intubated. - Monitor.  NEUROLOGIC A:  SAH and aneurysmal coiling. P:   - Dexamethasone. - Keppra. - Propofol/fentanyl drips for sedation. - Further recs per neurosurg.  I have personally obtained a history, examined the patient, evaluated laboratory and imaging results, formulated the assessment and plan and placed orders.  CRITICAL CARE: The patient is critically ill with multiple organ systems failure and requires high complexity decision making for assessment and support, frequent evaluation and titration of therapies, application of advanced monitoring technologies and extensive interpretation of multiple databases. Critical Care Time devoted to patient care services described in this note is 45 minutes.   Alyson Reedy, M.D. Pulmonary and Critical Care Medicine [facility] Pager: [phone]  12/03/2012, 3:57 PM

## 2012-12-03 NOTE — Progress Notes (Signed)
eLink Physician-Brief Progress Note Patient Name: Dennis Wyatt DOB: Apr 20, 1961 MRN: [id number]  Date of Service  12/03/2012   HPI/Events of Note   cxr shows colikled ng tube in esophagus  eICU Interventions  Instructed RN to pull it out and reinsert a new one   Intervention Category Minor Interventions: Other:  Avelyn Touch 12/03/2012, 5:39 PM

## 2012-12-03 NOTE — Progress Notes (Signed)
INITIAL NUTRITION ASSESSMENT  DOCUMENTATION CODES Per approved criteria  -Obesity Unspecified   INTERVENTION: If pt remains intubated recommend: Initiate Promote @ 15 ml/hr via OG tube.  60 ml Prostat TID.  At goal rate, tube feeding regimen will provide 960 kcal, 129 grams of protein, and 302 ml of H2O.   At current Propofol rate, TF regimen and Propofol will provide 2037 kcal (24 kcal/kg IBW), this would be inadequate in protein while on high rate of Propofol.   NUTRITION DIAGNOSIS: Inadequate oral intake related to inability to eat as evidenced by NPO status.  Goal: Enteral nutrition to provide 60-70% of estimated calorie needs (22-25 kcals/kg ideal body weight) and 100% of estimated protein needs, based on ASPEN guidelines for permissive underfeeding in critically ill obese individuals.  Monitor:  Vent status, TF initiation, plan of care, weight trend, labs  Reason for Assessment: Ventilator  52 y.o. male  Admitting Dx: Large SAH  ASSESSMENT: Pt admitted this am with sudden onset of headache. CT shows large SAH with ICH. Pt s/p coiling. Pt just admitted to 3100. Patient is currently intubated on ventilator support.  MV: 9 Temp:Temp (24hrs), Avg:95.5 F (35.3 C), Min:93 F (33.9 C), Max:98.4 F (36.9 C)  Propofol: 40.8 ml/hr providing 1077 kcal from lipid   Height: Ht Readings from Last 1 Encounters:  12/03/12 6\' 1"  (1.854 m)    Weight: Wt Readings from Last 1 Encounters:  12/03/12 300 lb (136.079 kg)    Ideal Body Weight: 83.6 kg  % Ideal Body Weight: 163%  Wt Readings from Last 10 Encounters:  12/03/12 300 lb (136.079 kg)  12/03/12 300 lb (136.079 kg)    Usual Body Weight: unknown  % Usual Body Weight: -  BMI:  Body mass index is 39.59 kg/(m^2). Obesity Class II  Estimated Nutritional Needs: Kcal: 2400 Protein: >167 grams Fluid: > 2 L/day  Skin: no issues noted  Diet Order:   NPO  EDUCATION NEEDS: -No education needs identified at this  time   Intake/Output Summary (Last 24 hours) at 12/03/12 1503 Last data filed at 12/03/12 1340  Gross per 24 hour  Intake   1000 ml  Output    350 ml  Net    650 ml    Last BM: PTA   Labs:   Recent Labs Lab 12/03/12 0710 12/03/12 0733  NA 142 142  K 3.9 3.9  CL 106 108  CO2 23  --   BUN 23 23  CREATININE 0.68 0.90  CALCIUM 9.2  --   GLUCOSE 157* 146*    CBG (last 3)   Recent Labs  12/03/12 0901  GLUCAP 167*   No results found for this basename: HGBA1C    Scheduled Meds: . fentaNYL      . lidocaine (cardiac) 100 mg/64ml      . mannitol  12.5-25 g Intravenous to XRAY  . nitroGLYCERIN      . rocuronium        Continuous Infusions: . fentaNYL infusion INTRAVENOUS 25 mcg/hr (12/03/12 0841)  . propofol 50 mcg/kg/min (12/03/12 1357)    Past Medical History  Diagnosis Date  . Hypertension     History reviewed. No pertinent past surgical history.  Kendell Bane RD, LDN, CNSC [phone] Pager [phone] After Hours Pager

## 2012-12-03 NOTE — ED Provider Notes (Signed)
Medical screening examination/treatment/procedure(s) were conducted as a shared visit with non-physician practitioner(s) and myself.  I personally evaluated the patient during the encounter  Pt with syncopal episode, then in the ED an episode of transient mental status change and HTN. CT shows SAH, likely aneurysmal. Drs. Yetta Barre and [place] at the bedside to evaluate and plan IR then OR. They asked that the patient be intubated prior to leaving the ED for airway protection. Pt informed of this plan and gives verbal consent.   INTUBATION Performed by: Pollyann Savoy  Required items: required blood products, implants, devices, and special equipment available Patient identity confirmed: provided demographic data and hospital-assigned identification number Time out: Immediately prior to procedure a "time out" was called to verify the correct patient, procedure, equipment, support staff and site/side marked as required.  Indications: airway protection, SAH  Intubation method: Glidescope Laryngoscopy   Preoxygenation: BVM  Sedatives: Etomidate Paralytic: Succinylcholine  Tube Size: 8.0 cuffed  Post-procedure assessment: chest rise and ETCO2 monitor Breath sounds: equal and absent over the epigastrium Tube secured with: ETT holder Chest x-ray interpreted by radiologist and me.  Chest x-ray findings: endotracheal tube in appropriate position  Patient tolerated the procedure well with no immediate complications however he became bradycardic about post intubation. Given 0.5mg  atropine with some improvement. Dr. Yetta Barre informed of this episode. IR is evaluating the patient now to be taken over for angiogram. Pt given Propofol and Fentanyl for sedation. BP unchanged during and after intubation at about 175/90. Head of the bed to 30 degrees.   CRITICAL CARE Performed by: Pollyann Savoy Total critical care time: 45 Critical care time was exclusive of separately billable procedures and  treating other patients. Critical care was necessary to treat or prevent imminent or life-threatening deterioration. Critical care was time spent personally by me on the following activities: development of treatment plan with patient and/or surrogate as well as nursing, discussions with consultants, evaluation of patient's response to treatment, examination of patient, obtaining history from patient or surrogate, ordering and performing treatments and interventions, ordering and review of laboratory studies, ordering and review of radiographic studies, pulse oximetry and re-evaluation of patient's condition.      Charles B. Bernette Mayers, MD 12/03/12 1136

## 2012-12-03 NOTE — Progress Notes (Signed)
Nursing 1600 RN requested orders for the IVC level from Dr. Franky Macho, order to place at 10cm of H2O. Will continue to assess.

## 2012-12-03 NOTE — Progress Notes (Signed)
Nursing 1600 Dr. Molli Knock made aware that patient needs sedation/analgesia reordered and that patient is currently on Fentanyl and Propofol gtts that were continued s/p interventional.  MD also made aware of patient's temp, will continue to use warm blankets at this time.

## 2012-12-04 ENCOUNTER — Inpatient Hospital Stay (HOSPITAL_COMMUNITY): Payer: No Typology Code available for payment source

## 2012-12-04 LAB — BLOOD GAS, ARTERIAL
Acid-Base Excess: 0.1 mmol/L (ref 0.0–2.0)
Bicarbonate: 24.5 meq/L — ABNORMAL HIGH (ref 20.0–24.0)
FIO2: 0.4 %
MECHVT: 640 mL
O2 Saturation: 96.3 %
PEEP: 5 cmH2O
Patient temperature: 98.6
RATE: 16 resp/min
TCO2: 25.8 mmol/L (ref 0–100)
pCO2 arterial: 42.4 mmHg (ref 35.0–45.0)
pH, Arterial: 7.381 (ref 7.350–7.450)
pO2, Arterial: 81.8 mmHg (ref 80.0–100.0)

## 2012-12-04 LAB — BASIC METABOLIC PANEL
BUN: 17 mg/dL (ref 6–23)
CO2: 22 meq/L (ref 19–32)
Calcium: 8.1 mg/dL — ABNORMAL LOW (ref 8.4–10.5)
Chloride: 110 meq/L (ref 96–112)
Creatinine, Ser: 0.66 mg/dL (ref 0.50–1.35)
GFR calc Af Amer: >90 mL/min (ref >90)
GFR calc non Af Amer: >90 mL/min (ref >90)
Glucose, Bld: 153 mg/dL — ABNORMAL HIGH (ref 70–99)
Potassium: 4.1 meq/L (ref 3.5–5.1)
Sodium: 142 meq/L (ref 135–145)

## 2012-12-04 LAB — CBC WITH DIFFERENTIAL/PLATELET
Basophils Absolute: 0 10*3/uL (ref 0.0–0.1)
Basophils Relative: 0 % (ref 0–1)
Eosinophils Absolute: 0 10*3/uL (ref 0.0–0.7)
Eosinophils Relative: 0 % (ref 0–5)
HCT: 48 % (ref 39.0–52.0)
Hemoglobin: 16.8 g/dL (ref 13.0–17.0)
Lymphocytes Relative: 4 % — ABNORMAL LOW (ref 12–46)
Lymphs Abs: 0.5 10*3/uL — ABNORMAL LOW (ref 0.7–4.0)
MCH: 33.1 pg (ref 26.0–34.0)
MCHC: 35 g/dL (ref 30.0–36.0)
MCV: 94.5 fL (ref 78.0–100.0)
Monocytes Absolute: 0.4 10*3/uL (ref 0.1–1.0)
Monocytes Relative: 4 % (ref 3–12)
Neutro Abs: 9.8 10*3/uL — ABNORMAL HIGH (ref 1.7–7.7)
Neutrophils Relative %: 92 % — ABNORMAL HIGH (ref 43–77)
Platelets: 137 10*3/uL — ABNORMAL LOW (ref 150–400)
RBC: 5.08 MIL/uL (ref 4.22–5.81)
RDW: 12.9 % (ref 11.5–15.5)
WBC: 10.6 10*3/uL — ABNORMAL HIGH (ref 4.0–10.5)

## 2012-12-04 LAB — MAGNESIUM: Magnesium: 2.1 mg/dL (ref 1.5–2.5)

## 2012-12-04 LAB — GLUCOSE, CAPILLARY: Glucose-Capillary: 134 mg/dL — ABNORMAL HIGH (ref 70–99)

## 2012-12-04 LAB — PHOSPHORUS: Phosphorus: 2.7 mg/dL (ref 2.3–4.6)

## 2012-12-04 MED ORDER — NIMODIPINE 30 MG PO CAPS
30.0000 mg | ORAL_CAPSULE | ORAL | Status: DC
  Administered 2012-12-04 – 2012-12-07 (×34): 30 mg via ORAL
  Filled 2012-12-04 (×44): qty 1

## 2012-12-04 MED ORDER — HYDRALAZINE HCL 20 MG/ML IJ SOLN
20.0000 mg | Freq: Four times a day (QID) | INTRAMUSCULAR | Status: DC
  Administered 2012-12-04 – 2012-12-05 (×3): 20 mg via INTRAVENOUS
  Filled 2012-12-04 (×6): qty 1

## 2012-12-04 MED ORDER — NIMODIPINE 60 MG/20ML PO SOLN
60.0000 mg | ORAL | Status: DC
  Administered 2012-12-04: 60 mg via ORAL
  Filled 2012-12-04 (×5): qty 20

## 2012-12-04 MED ORDER — HYDRALAZINE HCL 20 MG/ML IJ SOLN
20.0000 mg | Freq: Four times a day (QID) | INTRAMUSCULAR | Status: DC
  Filled 2012-12-04: qty 1

## 2012-12-04 MED ORDER — ACETAMINOPHEN 160 MG/5ML PO SOLN
650.0000 mg | ORAL | Status: DC | PRN
  Administered 2012-12-04: 650 mg via ORAL
  Filled 2012-12-04: qty 20.3

## 2012-12-04 MED ORDER — NIMODIPINE 30 MG PO CAPS
60.0000 mg | ORAL_CAPSULE | ORAL | Status: DC
  Filled 2012-12-04 (×5): qty 2

## 2012-12-04 MED ORDER — NIMODIPINE 60 MG/20ML PO SOLN
30.0000 mg | ORAL | Status: DC
  Filled 2012-12-04 (×19): qty 10

## 2012-12-04 NOTE — Progress Notes (Signed)
While performing mouthcare, patient's tooth was notably loose. Upon OG/ET tube movement, tooth became dislodged. Tooth was placed in a biohazard bag and put with the patient's belongings.

## 2012-12-04 NOTE — Progress Notes (Signed)
Following 1200 meds, pt vomited. Pt stated he wasn't nauseated and did not vomit again until Nimotop was given at 1745. 4 mg Zofran given. Will continue to monitor pt and update as needed.

## 2012-12-04 NOTE — Evaluation (Addendum)
Physical Therapy Evaluation Patient Details Name: Dennis Wyatt MRN: [id number] DOB: 05-Jul-1961 Today's Date: 12/04/2012 Time: 6578-4696 PT Time Calculation (min): 18 min  PT Assessment / Plan / Recommendation Clinical Impression    Pt admitted with rt. SAH due to aneurysm. Underwent coiling of aneurysm. Pt currently with functional limitations due to the deficits listed below (PT Problem List). Pt will benefit from skilled PT to increase their independence and safety with mobility to allow discharge to CIR.      PT Assessment  Patient needs continued PT services    Follow Up Recommendations  CIR    Does the patient have the potential to tolerate intense rehabilitation      Barriers to Discharge Decreased caregiver support      Equipment Recommendations  Rolling walker with 5" wheels    Recommendations for Other Services Rehab consult   Frequency Min 3X/week    Precautions / Restrictions Precautions Precautions: Fall Precaution Comments: Ventriculostomy drain must be clamped with mobility   Pertinent Vitals/Pain VSS      Mobility  Bed Mobility Bed Mobility: Supine to Sit;Sitting - Scoot to [place] of Bed;Sit to Supine Supine to Sit: 1: +2 Total assist;HOB elevated Supine to Sit: Patient Percentage: 70% Sitting - Scoot to Edge of Bed: 4: Min assist Sit to Supine: 1: +2 Total assist Sit to Supine: Patient Percentage: 80% Details for Bed Mobility Assistance: Assist to bring trunk up and to manage lines/tubes Transfers Transfers: Sit to Stand;Stand to Sit Sit to Stand: 1: +2 Total assist;With upper extremity assist;From bed Sit to Stand: Patient Percentage: 60% Stand to Sit: 1: +2 Total assist;With upper extremity assist;To bed Stand to Sit: Patient Percentage: 60% Details for Transfer Assistance: Assist to bring hips up. Ambulation/Gait Ambulation/Gait Assistance: 1: +2 Total assist Ambulation Distance (Feet): 2 Feet (steps laterally toward [facility]) Assistive device: 2  person hand held assist    Exercises     PT Diagnosis: Difficulty walking;Generalized weakness;Altered mental status  PT Problem List: Decreased strength;Decreased activity tolerance;Decreased balance;Decreased mobility;Decreased cognition;Decreased knowledge of precautions;Decreased safety awareness;Decreased knowledge of use of DME PT Treatment Interventions:     PT Goals Acute Rehab PT Goals PT Goal Formulation: Patient unable to participate in goal setting Time For Goal Achievement: 12/11/12 Potential to Achieve Goals: Good Pt will go Supine/Side to Sit: with supervision PT Goal: Supine/Side to Sit - Progress: Goal set today Pt will go Sit to Supine/Side: with supervision PT Goal: Sit to Supine/Side - Progress: Goal set today Pt will go Sit to Stand: with min assist PT Goal: Sit to Stand - Progress: Goal set today Pt will go Stand to Sit: with min assist PT Goal: Stand to Sit - Progress: Goal set today Pt will Ambulate: 51 - 150 feet;with min assist PT Goal: Ambulate - Progress: Goal set today  Visit Information  Last PT Received On: 12/04/12 Assistance Needed: +2    Subjective Data  Subjective: Pt requesting to pee.  Explained he has catheter in. 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    Copywriter, advertising Arousal/Alertness: Awake/alert Behavior During Therapy: Impulsive Overall Cognitive Status: Impaired/Different from baseline Area of Impairment:  Orientation;Attention;Memory;Safety/judgement;Problem solving Orientation Level:  Place;Time;Situation Current Attention Level: Sustained Memory: Decreased short-term memory Safety/Judgement: Decreased awareness of safety;Decreased awareness of deficits Problem Solving: Slow processing    Extremity/Trunk Assessment Right Lower Extremity Assessment RLE ROM/Strength/Tone: Deficits RLE ROM/Strength/Tone Deficits: ankle 3+/5, knee 4/5, hip 4/5 Left Lower Extremity Assessment LLE ROM/Strength/Tone: Deficits LLE ROM/Strength/Tone Deficits: grossly 4/5   Balance Balance Balance Assessed: Yes Static Sitting Balance Static Sitting - Balance Support: Bilateral upper extremity supported Static Sitting - Level of Assistance: 5: Stand by assistance Static Sitting - Comment/# of Minutes: Sat EOB x 5 minutes Static Standing Balance Static Standing - Balance Support: Left upper extremity supported Static Standing - Level of Assistance: 1: +2 Total assist (Pt = 70%)  End of Session PT - End of Session Equipment Utilized During Treatment: Oxygen Activity Tolerance: Patient tolerated treatment well Patient left: in bed;with call bell/phone within reach;with nursing in room Nurse Communication: Mobility status  GP     [facility] 12/04/2012, 2:27 PM  [employer] PT [phone]

## 2012-12-04 NOTE — Progress Notes (Signed)
Per conversation with Dr. Yetta Barre, changed Nimodipine to 30 mg q2 hours to see how patient tolerates lower dose. If patient still has nausea and vomiting at lower dose, hold medication and notify physician.

## 2012-12-04 NOTE — Consult Note (Signed)
PULMONARY  / CRITICAL CARE MEDICINE  Name: Dennis Wyatt MRN: [id number] DOB: [birth year]    ADMISSION DATE:  12/03/2012 CONSULTATION DATE:  12/03/2012  REFERRING MD :  Dr. Yetta Barre PRIMARY SERVICE: Neuro surg  CHIEF COMPLAINT:  VDRF  BRIEF PATIENT DESCRIPTION: 52 year old male who was at work when noticed headache and co-workers noted change in his mental status.  Patient was taken to the ED where he was noted to have an aneurysm with SAH.  Patient was taken to IR, intubated and aneurysm was coiled.  Patient was brought to the ICU post coiling intubated and PCCM was asked to see on consultation.  SIGNIFICANT EVENTS / STUDIES:  5/23 S/P coiling of aneurysm.  LINES / TUBES: PIV ET tube 5/23>>>5-24  CULTURES: None  ANTIBIOTICS: Surgical prophylaxis only.   SUBJECTIVE:  Alert after extubation  VITAL SIGNS: Temp:  [93 F (33.9 C)-99.9 F (37.7 C)] 99.9 F (37.7 C) (05/24 0700) Pulse Rate:  [45-89] 89 (05/24 0802) Resp:  [8-19] 15 (05/24 0802) BP: (94-211)/(42-93) 175/76 mmHg (05/24 0802) SpO2:  [95 %-100 %] 97 % (05/24 0738) Arterial Line BP: (114-158)/(52-68) 124/56 mmHg (05/24 0700) FiO2 (%):  [40 %-100 %] 40 % (05/24 0802) Weight:  [115.1 kg (253 lb 12 oz)] 115.1 kg (253 lb 12 oz) (05/23 1500) HEMODYNAMICS:   VENTILATOR SETTINGS: Vent Mode:  [-] CPAP FiO2 (%):  [40 %-100 %] 40 % Set Rate:  [14 bmp-16 bmp] 16 bmp Vt Set:  [640 mL] 640 mL PEEP:  [5 cmH20] 5 cmH20 Pressure Support:  [5 cmH20] 5 cmH20 Plateau Pressure:  [16 cmH20-18 cmH20] 18 cmH20 INTAKE / OUTPUT: Intake/Output     05/23 0701 - 05/24 0700 05/24 0701 - 05/25 0700   I.V. (mL/kg) 3499.7 (30.4) 13.2 (0.1)   IV Piggyback 210    Total Intake(mL/kg) 3709.7 (32.2) 13.2 (0.1)   Urine (mL/kg/hr) 1603 (0.6)    Drains 164 (0.1)    Blood 100 (0)    Total Output 1867     Net +1842.7 +13.2          PHYSICAL EXAMINATION: General:  Well appearing, awake on vent Neuro:  Follows commands and maex 4 HEENT:   [place]/AT, PERRL, EOM-I and -LAN/Thyromegally. Cardiovascular:  RRR, Nl S1/S2, -M/R/G. Lungs:  CTA bilaterally. Abdomen:  Soft, NT, ND and +BS. Musculoskeletal:  -edema and -tenderness. Skin:  Intact.  LABS:  Recent Labs Lab 12/03/12 0710 12/03/12 0733 12/03/12 1619 12/04/12 0400 12/04/12 0500  HGB 19.1* 19.4*  --  16.8  --   WBC 9.2  --   --  10.6*  --   PLT 164  --   --  137*  --   NA 142 142  --  142  --   K 3.9 3.9  --  4.1  --   CL 106 108  --  110  --   CO2 23  --   --  22  --   GLUCOSE 157* 146*  --  153*  --   BUN 23 23  --  17  --   CREATININE 0.68 0.90  --  0.66  --   CALCIUM 9.2  --   --  8.1*  --   MG  --   --   --  2.1  --   PHOS  --   --   --  2.7  --   AST 21  --   --   --   --   ALT 18  --   --   --   --  ALKPHOS 91  --   --   --   --   BILITOT 0.7  --   --   --   --   PROT 6.8  --   --   --   --   ALBUMIN 3.9  --   --   --   --   APTT 25  --   --   --   --   INR 0.97  --   --   --   --   PHART  --   --  7.302*  --  7.381  PCO2ART  --   --  54.4*  --  42.4  PO2ART  --   --  455.0*  --  81.8    Recent Labs Lab 12/03/12 0901 12/04/12 0753  GLUCAP 167* 134*    Imaging: Ct Head Wo Contrast  12/03/2012   *RADIOLOGY REPORT*  Clinical Data: Post aneurysm coiling for subarachnoid hemorrhage  CT HEAD WITHOUT CONTRAST  Technique:  Contiguous axial images were obtained from the base of the skull through the vertex without contrast.  Comparison: CT 12/03/2012  Findings: Interval coiling of anterior communicating artery aneurysm.  Large coil pack in satisfactory position and configuration.  Interval placement of ventricular catheter from   right frontal approach with the tip in the third ventricle.  Interval decompression of ventricles.  Severe subarachnoid hemorrhage.   There is increase in intraventricular blood compared with earlier CT.  There remains blood in the interventricular septum, extending into the inferior frontal lobe on the right which is similar.   IMPRESSION: Interval coiling of aneurysm in the region of the anterior communicating artery with a satisfactory appearance.  Interval placement of ventricular catheter with decompression of the ventricles.  Severe diffuse subarachnoid hemorrhage.  There is increase in ventricular blood which may be related to re bleeding since the prior CT.   Original Report Authenticated By: Janeece Riggers, M.D.   Ct Head Wo Contrast  12/03/2012   *RADIOLOGY REPORT*  Clinical Data: Severe headache.  Hypertension.  CT HEAD WITHOUT CONTRAST  Technique:  Contiguous axial images were obtained from the base of the skull through the vertex without contrast.  Comparison: None.  Findings: Large subarachnoid hemorrhage.  There is extensive subarachnoid hemorrhage around the basilar cisterns and into the sylvian fissure bilaterally.  There is subarachnoid blood over the convexity.  There is a large clot in the anterior septum pellucidum extending into the right inferior frontal lobe. There is also intraventricular hemorrhage.  There is  developing hydrocephalus with mild enlargement the ventricles and effacement of the subarachnoid space.  Fourth ventricle appears compressed due to increased intracranial pressure.  No acute infarct identified.  IMPRESSION: Large subarachnoid hemorrhage.  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 There is a parenchymal component to the hemorrhage in the right inferior frontal lobe.  There is blood in the anterior interventricular septum with extension into the ventricles.  There is developing hydrocephalus and increased intracranial pressure.  Critical Value/emergent results were called by telephone at the time of interpretation on 12/03/2012 at 0735 hours to Dr. Renae Gloss, who verbally acknowledged these results.   Original Report Authenticated By: Janeece Riggers, M.D.   Dg Chest Port 1 View  12/04/2012   *RADIOLOGY REPORT*  Clinical Data: Ventilator  dependent respiratory failure.  Follow up basilar atelectasis.  PORTABLE CHEST - 1 VIEW 12/04/2012 0550 hours:  Comparison: Portable chest x-ray yesterday.  Findings: Endotracheal tube tip  in satisfactory position projecting approximately 6 cm above the carina.  Nasogastric courses below the diaphragm into the stomach.  Cardiac silhouette upper normal in size for technique.  Suboptimal inspiration with mild atelectasis in the bases, though aeration has improved slightly since yesterday.  Mild pulmonary venous hypertension without overt edema. No new pulmonary parenchymal abnormalities.  IMPRESSION: Support apparatus satisfactory.  Suboptimal inspiration accounts for mild bibasilar atelectasis, though aeration has improved slightly since yesterday.  No new abnormalities.   Original Report Authenticated By: Hulan Saas, M.D.   Dg Chest Port 1 View  12/03/2012   *RADIOLOGY REPORT*  Clinical Data: Endotracheal tube placement  PORTABLE CHEST - 1 VIEW  Comparison: 12/03/2012  Findings: Endotracheal tube is appropriately positioned. Nasogastric tube is coiled upon itself with tip not well visualized by approximately oriented back upon itself over the distal esophagus.  Heart size is upper limits of normal with central vascular congestion and overall hypoaeration.  No new focal pulmonary opacity.  IMPRESSION: Interval placement of nasogastric tube, coiled upon itself, tip oriented over the approximate location of the distal esophagus. Repositioning is recommended.  Low lung volumes with curvilinear bibasilar probable atelectasis.   Original Report Authenticated By: Christiana Pellant, M.D.   Dg Chest Portable 1 View  12/03/2012   *RADIOLOGY REPORT*  Clinical Data: GI bleed.  Intubation.  PORTABLE CHEST - 1 VIEW  Comparison: None.  Findings: 0835 hours.  Endotracheal tube tip is in the mid trachea. The heart size and mediastinal contours are normal.  There are low lung volumes with patchy bibasilar opacities, probably  atelectasis. No consolidation, pleural effusion or pneumothorax is identified. Osseous structures appear unremarkable.  IMPRESSION: Satisfactorily positioned endotracheal tube.  Patchy bibasilar opacities, likely atelectasis secondary to suboptimal inspiration.   Original Report Authenticated By: Carey Bullocks, M.D.   Dg Abd Portable 1v  12/03/2012   *RADIOLOGY REPORT*  Clinical Data: NG tube placement  PORTABLE ABDOMEN - 1 VIEW  Comparison: None.  Findings: The tip of the NG tube is in the fundus of the stomach. No disproportionate dilatation of bowel.  No obvious free intraperitoneal gas.  IMPRESSION: NG tube tip is in the fundus of the stomach.   Original Report Authenticated By: Jolaine Click, M.D.     ASSESSMENT / PLAN:  PULMONARY A: VDRF post SAH and aneurysm coiling. P:    - Extubated 5-24 -titrate oxygen  CARDIOVASCULAR A: HTN history, BP varies widely at this time. Bp goal 120-140 P:  - Nimodipine  -cardene drip for BP control.max out Will add hydralazine  RENAL A:  No active issues. P:   - BMET in AM and replace electrolytes as needed.  GASTROINTESTINAL A:  No active issues. P:   - NPO now that pt is extub  HEMATOLOGIC  Recent Labs  12/03/12 0733 12/04/12 0400  HGB 19.4* 16.8    A:  Elevated Hg, likely related to dehydration.  No history of polycythemia Resolved 5/24. P:  - IV hydration. - Resolved 5-24  INFECTIOUS A:  No active issues. P:   - Surgical prophylaxis only.  ENDOCRINE A:  No diabetes history.   P:   - - Monitor.  NEUROLOGIC A:  SAH and aneurysmal coiling. P:   - Dexamethasone. - Keppra. - Further recs per neurosurg.  CC Dorcas Carrow Beeper  [phone]  Cell  [phone]  If no response or cell goes to voicemail, call beeper [phone]   12/04/2012, 8:08 AM

## 2012-12-04 NOTE — Progress Notes (Signed)
1 Day Post-Op  Subjective: A COM Aneurysm coiling 5/23 On vent Responding some to voice  Objective: Vital signs in last 24 hours: Temp:  [95 F (35 C)-99.9 F (37.7 C)] 99.5 F (37.5 C) (05/24 0900) Pulse Rate:  [50-89] 76 (05/24 0900) Resp:  [8-19] 17 (05/24 0900) BP: (94-175)/(42-76) 135/58 mmHg (05/24 0900) SpO2:  [95 %-100 %] 98 % (05/24 0900) Arterial Line BP: (114-170)/(52-82) 136/59 mmHg (05/24 0900) FiO2 (%):  [39.6 %-100 %] 40.6 % (05/24 0900) Weight:  [253 lb 12 oz (115.1 kg)] 253 lb 12 oz (115.1 kg) (05/23 1500)    Intake/Output from previous day: 05/23 0701 - 05/24 0700 In: 3709.7 [I.V.:3499.7; IV Piggyback:210] Out: 1867 [Urine:1603; Drains:164; Blood:100] Intake/Output this shift: Total I/O In: 280.6 [I.V.:280.6] Out: 200 [Urine:170; Drains:30]  PE:  Low grade temp BP: 150s to 130s On Vent Moves left side well to command - can squeeze and move foot approp Rt hand some movement to command None on Rt leg/foot Rt froin NT no bleeding; no hematoma Rt foot 2+ pulses  Lab Results:   Recent Labs  12/03/12 0710 12/03/12 0733 12/04/12 0400  WBC 9.2  --  10.6*  HGB 19.1* 19.4* 16.8  HCT 53.6* 57.0* 48.0  PLT 164  --  137*   BMET  Recent Labs  12/03/12 0710 12/03/12 0733 12/04/12 0400  NA 142 142 142  K 3.9 3.9 4.1  CL 106 108 110  CO2 23  --  22  GLUCOSE 157* 146* 153*  BUN 23 23 17   CREATININE 0.68 0.90 0.66  CALCIUM 9.2  --  8.1*   PT/INR  Recent Labs  12/03/12 0710  LABPROT 12.8  INR 0.97   ABG  Recent Labs  12/03/12 1619 12/04/12 0500  PHART 7.302* 7.381  HCO3 26.9* 24.5*    Studies/Results: Ct Head Wo Contrast  12/03/2012   *RADIOLOGY REPORT*  Clinical Data: Post aneurysm coiling for subarachnoid hemorrhage  CT HEAD WITHOUT CONTRAST  Technique:  Contiguous axial images were obtained from the base of the skull through the vertex without contrast.  Comparison: CT 12/03/2012  Findings: Interval coiling of anterior  communicating artery aneurysm.  Large coil pack in satisfactory position and configuration.  Interval placement of ventricular catheter from   right frontal approach with the tip in the third ventricle.  Interval decompression of ventricles.  Severe subarachnoid hemorrhage.   There is increase in intraventricular blood compared with earlier CT.  There remains blood in the interventricular septum, extending into the inferior frontal lobe on the right which is similar.  IMPRESSION: Interval coiling of aneurysm in the region of the anterior communicating artery with a satisfactory appearance.  Interval placement of ventricular catheter with decompression of the ventricles.  Severe diffuse subarachnoid hemorrhage.  There is increase in ventricular blood which may be related to re bleeding since the prior CT.   Original Report Authenticated By: Janeece Riggers, M.D.   Ct Head Wo Contrast  12/03/2012   *RADIOLOGY REPORT*  Clinical Data: Severe headache.  Hypertension.  CT HEAD WITHOUT CONTRAST  Technique:  Contiguous axial images were obtained from the base of the skull through the vertex without contrast.  Comparison: None.  Findings: Large subarachnoid hemorrhage.  There is extensive subarachnoid hemorrhage around the basilar cisterns and into the sylvian fissure bilaterally.  There is subarachnoid blood over the convexity.  There is a large clot in the anterior septum pellucidum extending into the right inferior frontal lobe. There is also intraventricular hemorrhage.  There is  developing hydrocephalus with mild enlargement the ventricles and effacement of the subarachnoid space.  Fourth ventricle appears compressed due to increased intracranial pressure.  No acute infarct identified.  IMPRESSION: Large subarachnoid hemorrhage.  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 There is a parenchymal component to the hemorrhage in the right inferior frontal lobe.  There is  blood in the anterior interventricular septum with extension into the ventricles.  There is developing hydrocephalus and increased intracranial pressure.  Critical Value/emergent results were called by telephone at the time of interpretation on 12/03/2012 at 0735 hours to Dr. Renae Gloss, who verbally acknowledged these results.   Original Report Authenticated By: Janeece Riggers, M.D.   Dg Chest Port 1 View  12/04/2012   *RADIOLOGY REPORT*  Clinical Data: Ventilator dependent respiratory failure.  Follow up basilar atelectasis.  PORTABLE CHEST - 1 VIEW 12/04/2012 0550 hours:  Comparison: Portable chest x-ray yesterday.  Findings: Endotracheal tube tip in satisfactory position projecting approximately 6 cm above the carina.  Nasogastric courses below the diaphragm into the stomach.  Cardiac silhouette upper normal in size for technique.  Suboptimal inspiration with mild atelectasis in the bases, though aeration has improved slightly since yesterday.  Mild pulmonary venous hypertension without overt edema. No new pulmonary parenchymal abnormalities.  IMPRESSION: Support apparatus satisfactory.  Suboptimal inspiration accounts for mild bibasilar atelectasis, though aeration has improved slightly since yesterday.  No new abnormalities.   Original Report Authenticated By: Hulan Saas, M.D.   Dg Chest Port 1 View  12/03/2012   *RADIOLOGY REPORT*  Clinical Data: Endotracheal tube placement  PORTABLE CHEST - 1 VIEW  Comparison: 12/03/2012  Findings: Endotracheal tube is appropriately positioned. Nasogastric tube is coiled upon itself with tip not well visualized by approximately oriented back upon itself over the distal esophagus.  Heart size is upper limits of normal with central vascular congestion and overall hypoaeration.  No new focal pulmonary opacity.  IMPRESSION: Interval placement of nasogastric tube, coiled upon itself, tip oriented over the approximate location of the distal esophagus. Repositioning is  recommended.  Low lung volumes with curvilinear bibasilar probable atelectasis.   Original Report Authenticated By: Christiana Pellant, M.D.   Dg Chest Portable 1 View  12/03/2012   *RADIOLOGY REPORT*  Clinical Data: GI bleed.  Intubation.  PORTABLE CHEST - 1 VIEW  Comparison: None.  Findings: 0835 hours.  Endotracheal tube tip is in the mid trachea. The heart size and mediastinal contours are normal.  There are low lung volumes with patchy bibasilar opacities, probably atelectasis. No consolidation, pleural effusion or pneumothorax is identified. Osseous structures appear unremarkable.  IMPRESSION: Satisfactorily positioned endotracheal tube.  Patchy bibasilar opacities, likely atelectasis secondary to suboptimal inspiration.   Original Report Authenticated By: Carey Bullocks, M.D.   Dg Abd Portable 1v  12/03/2012   *RADIOLOGY REPORT*  Clinical Data: NG tube placement  PORTABLE ABDOMEN - 1 VIEW  Comparison: None.  Findings: The tip of the NG tube is in the fundus of the stomach. No disproportionate dilatation of bowel.  No obvious free intraperitoneal gas.  IMPRESSION: NG tube tip is in the fundus of the stomach.   Original Report Authenticated By: Jolaine Click, M.D.    Anti-infectives: Anti-infectives   None      Assessment/Plan: s/p Procedure(s): RADIOLOGY WITH ANESTHESIA (N/A)  A comm aneurysm coil 5/23 On vent Will follow Report yo Dr Corliss Skains  LOS: 1 day    Paris Hohn A 12/04/2012

## 2012-12-04 NOTE — Progress Notes (Addendum)
RT was called at 1002 while on 3300 finishing a treatment by RN to state Brett Canales Minor, NP had given the verbal to extubate. Upon walking in unit doors to extubate, vent alarms were going off and RN had just walked into room and pt had self-extubated with family at bedside and mitt on right hand.pt placed on 4L [place] and has strong adequate cough. Brett Canales Minor, NP made aware at 1020.  RT will continue to monitor.

## 2012-12-04 NOTE — Progress Notes (Signed)
Patient ID: Dennis Wyatt, male   DOB: October 23, 1960, 52 y.o.   MRN: [id number] Subjective: Patient is sedated and intubated.  Objective: Vital signs in last 24 hours: Temp:  [95 F (35 C)-99.9 F (37.7 C)] 99.9 F (37.7 C) (05/24 0800) Pulse Rate:  [50-89] 89 (05/24 0802) Resp:  [8-19] 15 (05/24 0802) BP: (94-175)/(42-76) 175/76 mmHg (05/24 0802) SpO2:  [95 %-100 %] 100 % (05/24 0800) Arterial Line BP: (114-170)/(52-82) 170/82 mmHg (05/24 0800) FiO2 (%):  [39.6 %-100 %] 40 % (05/24 0802) Weight:  [115.1 kg (253 lb 12 oz)] 115.1 kg (253 lb 12 oz) (05/23 1500)  Intake/Output from previous day: 05/23 0701 - 05/24 0700 In: 3709.7 [I.V.:3499.7; IV Piggyback:210] Out: 4098 [Urine:1603; Drains:164; Blood:100] Intake/Output this shift: Total I/O In: 111.1 [I.V.:111.1] Out: 187 [Urine:170; Drains:17]  Patient sedated and intubated and therefore there is no exam. His pupils are pinpoint. His ventriculostomy is patent with bloody CSF.  Lab Results: Lab Results  Component Value Date   WBC 10.6* 12/04/2012   HGB 16.8 12/04/2012   HCT 48.0 12/04/2012   MCV 94.5 12/04/2012   PLT 137* 12/04/2012   Lab Results  Component Value Date   INR 0.97 12/03/2012   BMET Lab Results  Component Value Date   NA 142 12/04/2012   K 4.1 12/04/2012   CL 110 12/04/2012   CO2 22 12/04/2012   GLUCOSE 153* 12/04/2012   BUN 17 12/04/2012   CREATININE 0.66 12/04/2012   CALCIUM 8.1* 12/04/2012    Studies/Results: Ct Head Wo Contrast  12/03/2012   *RADIOLOGY REPORT*  Clinical Data: Post aneurysm coiling for subarachnoid hemorrhage  CT HEAD WITHOUT CONTRAST  Technique:  Contiguous axial images were obtained from the base of the skull through the vertex without contrast.  Comparison: CT 12/03/2012  Findings: Interval coiling of anterior communicating artery aneurysm.  Large coil pack in satisfactory position and configuration.  Interval placement of ventricular catheter from   right frontal approach with the tip in the  third ventricle.  Interval decompression of ventricles.  Severe subarachnoid hemorrhage.   There is increase in intraventricular blood compared with earlier CT.  There remains blood in the interventricular septum, extending into the inferior frontal lobe on the right which is similar.  IMPRESSION: Interval coiling of aneurysm in the region of the anterior communicating artery with a satisfactory appearance.  Interval placement of ventricular catheter with decompression of the ventricles.  Severe diffuse subarachnoid hemorrhage.  There is increase in ventricular blood which may be related to re bleeding since the prior CT.   Original Report Authenticated By: Janeece Riggers, M.D.   Ct Head Wo Contrast  12/03/2012   *RADIOLOGY REPORT*  Clinical Data: Severe headache.  Hypertension.  CT HEAD WITHOUT CONTRAST  Technique:  Contiguous axial images were obtained from the base of the skull through the vertex without contrast.  Comparison: None.  Findings: Large subarachnoid hemorrhage.  There is extensive subarachnoid hemorrhage around the basilar cisterns and into the sylvian fissure bilaterally.  There is subarachnoid blood over the convexity.  There is a large clot in the anterior septum pellucidum extending into the right inferior frontal lobe. There is also intraventricular hemorrhage.  There is  developing hydrocephalus with mild enlargement the ventricles and effacement of the subarachnoid space.  Fourth ventricle appears compressed due to increased intracranial pressure.  No acute infarct identified.  IMPRESSION: Large subarachnoid hemorrhage.  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 There is a parenchymal  component to the hemorrhage in the right inferior frontal lobe.  There is blood in the anterior interventricular septum with extension into the ventricles.  There is developing hydrocephalus and increased intracranial pressure.  Critical Value/emergent results  were called by telephone at the time of interpretation on 12/03/2012 at 0735 hours to Dr. Renae Gloss, who verbally acknowledged these results.   Original Report Authenticated By: Janeece Riggers, M.D.   Dg Chest Port 1 View  12/04/2012   *RADIOLOGY REPORT*  Clinical Data: Ventilator dependent respiratory failure.  Follow up basilar atelectasis.  PORTABLE CHEST - 1 VIEW 12/04/2012 0550 hours:  Comparison: Portable chest x-ray yesterday.  Findings: Endotracheal tube tip in satisfactory position projecting approximately 6 cm above the carina.  Nasogastric courses below the diaphragm into the stomach.  Cardiac silhouette upper normal in size for technique.  Suboptimal inspiration with mild atelectasis in the bases, though aeration has improved slightly since yesterday.  Mild pulmonary venous hypertension without overt edema. No new pulmonary parenchymal abnormalities.  IMPRESSION: Support apparatus satisfactory.  Suboptimal inspiration accounts for mild bibasilar atelectasis, though aeration has improved slightly since yesterday.  No new abnormalities.   Original Report Authenticated By: Hulan Saas, M.D.   Dg Chest Port 1 View  12/03/2012   *RADIOLOGY REPORT*  Clinical Data: Endotracheal tube placement  PORTABLE CHEST - 1 VIEW  Comparison: 12/03/2012  Findings: Endotracheal tube is appropriately positioned. Nasogastric tube is coiled upon itself with tip not well visualized by approximately oriented back upon itself over the distal esophagus.  Heart size is upper limits of normal with central vascular congestion and overall hypoaeration.  No new focal pulmonary opacity.  IMPRESSION: Interval placement of nasogastric tube, coiled upon itself, tip oriented over the approximate location of the distal esophagus. Repositioning is recommended.  Low lung volumes with curvilinear bibasilar probable atelectasis.   Original Report Authenticated By: Christiana Pellant, M.D.   Dg Chest Portable 1 View  12/03/2012   *RADIOLOGY  REPORT*  Clinical Data: GI bleed.  Intubation.  PORTABLE CHEST - 1 VIEW  Comparison: None.  Findings: 0835 hours.  Endotracheal tube tip is in the mid trachea. The heart size and mediastinal contours are normal.  There are low lung volumes with patchy bibasilar opacities, probably atelectasis. No consolidation, pleural effusion or pneumothorax is identified. Osseous structures appear unremarkable.  IMPRESSION: Satisfactorily positioned endotracheal tube.  Patchy bibasilar opacities, likely atelectasis secondary to suboptimal inspiration.   Original Report Authenticated By: Carey Bullocks, M.D.   Dg Abd Portable 1v  12/03/2012   *RADIOLOGY REPORT*  Clinical Data: NG tube placement  PORTABLE ABDOMEN - 1 VIEW  Comparison: None.  Findings: The tip of the NG tube is in the fundus of the stomach. No disproportionate dilatation of bowel.  No obvious free intraperitoneal gas.  IMPRESSION: NG tube tip is in the fundus of the stomach.   Original Report Authenticated By: Jolaine Click, M.D.    Assessment/Plan: Patient sedated intubated status post coiling of a Acomm aneurysm. Continue medical management with [facility].   LOS: 1 day    Elyna Pangilinan S 12/04/2012, 9:18 AM

## 2012-12-05 ENCOUNTER — Inpatient Hospital Stay (HOSPITAL_COMMUNITY): Payer: No Typology Code available for payment source

## 2012-12-05 DIAGNOSIS — I602 Nontraumatic subarachnoid hemorrhage from anterior communicating artery: Secondary | ICD-10-CM | POA: Diagnosis present

## 2012-12-05 DIAGNOSIS — I671 Cerebral aneurysm, nonruptured: Secondary | ICD-10-CM | POA: Diagnosis present

## 2012-12-05 LAB — BASIC METABOLIC PANEL
BUN: 23 mg/dL (ref 6–23)
CO2: 25 meq/L (ref 19–32)
Calcium: 8.4 mg/dL (ref 8.4–10.5)
Chloride: 109 meq/L (ref 96–112)
Creatinine, Ser: 0.49 mg/dL — ABNORMAL LOW (ref 0.50–1.35)
GFR calc Af Amer: >90 mL/min (ref >90)
GFR calc non Af Amer: >90 mL/min (ref >90)
Glucose, Bld: 137 mg/dL — ABNORMAL HIGH (ref 70–99)
Potassium: 3.8 mEq/L (ref 3.5–5.1)
Sodium: 140 mEq/L (ref 135–145)

## 2012-12-05 LAB — CBC
HCT: 45.4 % (ref 39.0–52.0)
Hemoglobin: 15.9 g/dL (ref 13.0–17.0)
MCH: 33.1 pg (ref 26.0–34.0)
MCHC: 35 g/dL (ref 30.0–36.0)
MCV: 94.4 fL (ref 78.0–100.0)
Platelets: 134 10*3/uL — ABNORMAL LOW (ref 150–400)
RBC: 4.81 MIL/uL (ref 4.22–5.81)
RDW: 12.8 % (ref 11.5–15.5)
WBC: 17.4 10*3/uL — ABNORMAL HIGH (ref 4.0–10.5)

## 2012-12-05 MED ORDER — HYDRALAZINE HCL 25 MG PO TABS
25.0000 mg | ORAL_TABLET | Freq: Four times a day (QID) | ORAL | Status: DC
  Administered 2012-12-05 – 2012-12-07 (×8): 25 mg via ORAL
  Filled 2012-12-05 (×13): qty 1

## 2012-12-05 MED ORDER — PANTOPRAZOLE SODIUM 40 MG PO TBEC
40.0000 mg | DELAYED_RELEASE_TABLET | Freq: Every day | ORAL | Status: DC
  Administered 2012-12-05 – 2012-12-14 (×9): 40 mg via ORAL
  Filled 2012-12-05 (×9): qty 1

## 2012-12-05 NOTE — Progress Notes (Signed)
Patient ID: Dennis Wyatt, male   DOB: [birth year], 52 y.o.   MRN: [id number] Subjective:  The patient is alert and pleasant. He has no complaints.  Objective: Vital signs in last 24 hours: Temp:  [97.4 F (36.3 C)-99.9 F (37.7 C)] 97.4 F (36.3 C) (05/25 0351) Pulse Rate:  [56-99] 65 (05/25 0500) Resp:  [11-22] 16 (05/25 0500) BP: (100-175)/(46-93) 128/80 mmHg (05/25 0500) SpO2:  [93 %-100 %] 97 % (05/25 0500) Arterial Line BP: (104-170)/(48-82) 143/59 mmHg (05/25 0500) FiO2 (%):  [40 %-40.6 %] 40.5 % (05/24 1000)  Intake/Output from previous day: 05/24 0701 - 05/25 0700 In: 3014.6 [P.O.:260; I.V.:2544.6; IV Piggyback:210] Out: 2017 [Urine:1755; Drains:262] Intake/Output this shift:    Physical exam the patient is alert and oriented x2 (person in a hospital) his pupils are equal. He is moving all 4 extremities well with a no obvious weakness his speech is normal. His ventriculostomy is patent.  Lab Results:  Recent Labs  12/04/12 0400 12/05/12 0300  WBC 10.6* 17.4*  HGB 16.8 15.9  HCT 48.0 45.4  PLT 137* 134*   BMET  Recent Labs  12/04/12 0400 12/05/12 0300  NA 142 140  K 4.1 3.8  CL 110 109  CO2 22 25  GLUCOSE 153* 137*  BUN 17 23  CREATININE 0.66 0.49*  CALCIUM 8.1* 8.4    Studies/Results: Ct Head Wo Contrast  12/03/2012   *RADIOLOGY REPORT*  Clinical Data: Post aneurysm coiling for subarachnoid hemorrhage  CT HEAD WITHOUT CONTRAST  Technique:  Contiguous axial images were obtained from the base of the skull through the vertex without contrast.  Comparison: CT 12/03/2012  Findings: Interval coiling of anterior communicating artery aneurysm.  Large coil pack in satisfactory position and configuration.  Interval placement of ventricular catheter from   right frontal approach with the tip in the third ventricle.  Interval decompression of ventricles.  Severe subarachnoid hemorrhage.   There is increase in intraventricular blood compared with earlier CT.  There  remains blood in the interventricular septum, extending into the inferior frontal lobe on the right which is similar.  IMPRESSION: Interval coiling of aneurysm in the region of the anterior communicating artery with a satisfactory appearance.  Interval placement of ventricular catheter with decompression of the ventricles.  Severe diffuse subarachnoid hemorrhage.  There is increase in ventricular blood which may be related to re bleeding since the prior CT.   Original Report Authenticated By: Janeece Riggers, M.D.   Dg Chest Port 1 View  12/04/2012   *RADIOLOGY REPORT*  Clinical Data: Ventilator dependent respiratory failure.  Follow up basilar atelectasis.  PORTABLE CHEST - 1 VIEW 12/04/2012 0550 hours:  Comparison: Portable chest x-ray yesterday.  Findings: Endotracheal tube tip in satisfactory position projecting approximately 6 cm above the carina.  Nasogastric courses below the diaphragm into the stomach.  Cardiac silhouette upper normal in size for technique.  Suboptimal inspiration with mild atelectasis in the bases, though aeration has improved slightly since yesterday.  Mild pulmonary venous hypertension without overt edema. No new pulmonary parenchymal abnormalities.  IMPRESSION: Support apparatus satisfactory.  Suboptimal inspiration accounts for mild bibasilar atelectasis, though aeration has improved slightly since yesterday.  No new abnormalities.   Original Report Authenticated By: Hulan Saas, M.D.   Dg Chest Port 1 View  12/03/2012   *RADIOLOGY REPORT*  Clinical Data: Endotracheal tube placement  PORTABLE CHEST - 1 VIEW  Comparison: 12/03/2012  Findings: Endotracheal tube is appropriately positioned. Nasogastric tube is coiled upon itself with tip not well  visualized by approximately oriented back upon itself over the distal esophagus.  Heart size is upper limits of normal with central vascular congestion and overall hypoaeration.  No new focal pulmonary opacity.  IMPRESSION: Interval  placement of nasogastric tube, coiled upon itself, tip oriented over the approximate location of the distal esophagus. Repositioning is recommended.  Low lung volumes with curvilinear bibasilar probable atelectasis.   Original Report Authenticated By: Christiana Pellant, M.D.   Dg Chest Portable 1 View  12/03/2012   *RADIOLOGY REPORT*  Clinical Data: GI bleed.  Intubation.  PORTABLE CHEST - 1 VIEW  Comparison: None.  Findings: 0835 hours.  Endotracheal tube tip is in the mid trachea. The heart size and mediastinal contours are normal.  There are low lung volumes with patchy bibasilar opacities, probably atelectasis. No consolidation, pleural effusion or pneumothorax is identified. Osseous structures appear unremarkable.  IMPRESSION: Satisfactorily positioned endotracheal tube.  Patchy bibasilar opacities, likely atelectasis secondary to suboptimal inspiration.   Original Report Authenticated By: Carey Bullocks, M.D.   Dg Abd Portable 1v  12/03/2012   *RADIOLOGY REPORT*  Clinical Data: NG tube placement  PORTABLE ABDOMEN - 1 VIEW  Comparison: None.  Findings: The tip of the NG tube is in the fundus of the stomach. No disproportionate dilatation of bowel.  No obvious free intraperitoneal gas.  IMPRESSION: NG tube tip is in the fundus of the stomach.   Original Report Authenticated By: Jolaine Click, M.D.    Assessment/Plan: Status post coiling of aneurysm: The patient is doing well clinically. There is no clinical signs of vasospasm. I will increase his IV fluid a bit.  LOS: 2 days     Shaindy Reader D 12/05/2012, 7:56 AM

## 2012-12-05 NOTE — Evaluation (Signed)
Speech Language Pathology Evaluation Patient Details Name: Dennis Wyatt MRN: [id number] DOB: August 28, 1960 Today's Date: 12/05/2012 Time: 1300-1320 SLP Time Calculation (min): 20 min  Problem List:  Patient Active Problem List   Diagnosis Date Noted  . Subarachnoid hemorrhage from anterior communicating artery aneurysm 12/05/2012  . Anterior communicating artery aneurysm 12/05/2012  . Acute respiratory failure 12/03/2012  . Malignant hypertension 12/03/2012  . Altered mental status 12/03/2012   Past Medical History:  Past Medical History  Diagnosis Date  . Hypertension    Past Surgical History: History reviewed. No pertinent past surgical history. HPI:  Dennis Wyatt is an 52 y.o. male who was at work today with sudden onset of headache.  He was brought to ER by EMS and no other witnesses or family are present.  CT finds SAH with concern for ruptured aneurysm  Neurosurgery has seen pt and has asked IR to perform urgent cerebral angiogram and possible intervention.  PMHx notable for uncontrolled HTN with BP on arrival of 204/123.  Cognitive Linguistic Evaluation indicated.    Assessment / Plan / Recommendation Clinical Impression  Moderate to Severe cognitive deficits in areas of problem solving, attention, safety awareness, delayed recall of new information,  sequencing, and basic orientation.  ST to follow in acute care setting to address cognitive deficits to improve safety.  Recommend CIR consult .     SLP Assessment  Patient needs continued Speech Lanaguage Pathology Services    Follow Up Recommendations  Inpatient Rehab    Frequency and Duration min 2x/week  2 weeks      SLP Goals  SLP Goals Potential to Achieve Goals: Good Potential Considerations: Previous level of function;Cooperation/participation level;Family/community support Progress/Goals/Alternative treatment plan discussed with pt/caregiver and they: Agree SLP Goal #1: Maintain sustained attention to  complete 3-4 step basic ADL with min verbal cues to initiate task to completion SLP Goal #2: Improve intellectual  and emergent awarenes by correctly stating current physical deficits and possible problems that could happen during completion of ADL's in current environment SLP Goal #3: Improve orientation to time, place, and situation with min assist to refer to memory aids SLP Goal #4: Complete target problem solving tasks with moderate assist (i.e. use of call light, remote, phone) SLP Goal #5: Recall 5/5 am events with max verbal cues  SLP Evaluation Prior Functioning  Cognitive/Linguistic Baseline: Within functional limits Type of Home: House Lives With: Alone Available Help at Discharge: Family;Available 24 hours/day Education: Highschool education Vocation: Full time employment   Cognition  Overall Cognitive Status: Impaired/Different from baseline Arousal/Alertness: Awake/alert Orientation Level: Oriented to person Attention: Focused;Sustained Focused Attention: Impaired Focused Attention Impairment: Verbal basic;Functional basic Sustained Attention: Impaired Sustained Attention Impairment: Verbal basic;Functional basic Memory: Impaired Memory Impairment: Storage deficit;Retrieval deficit;Decreased recall of new information;Decreased long term memory;Decreased short term memory Decreased Long Term Memory: Verbal basic;Verbal complex;Functional basic Decreased Short Term Memory: Verbal basic;Verbal complex;Functional basic Awareness: Impaired Awareness Impairment: Intellectual impairment;Emergent impairment;Anticipatory impairment Problem Solving: Impaired Problem Solving Impairment: Verbal basic;Functional basic Executive Function: Reasoning;Sequencing;Organizing;Decision Making;Self Monitoring;Initiating Reasoning: Impaired Reasoning Impairment: Verbal basic;Functional basic Sequencing: Impaired Sequencing Impairment: Verbal basic;Functional basic Organizing:  Impaired Organizing Impairment: Verbal basic;Functional basic Decision Making: Impaired Decision Making Impairment: Verbal basic;Functional basic Initiating: Impaired Initiating Impairment: Verbal basic;Functional basic Self Monitoring: Appears intact Behaviors: Impulsive Safety/Judgment: Impaired    Comprehension  Auditory Comprehension Overall Auditory Comprehension: Appears within functional limits for tasks assessed Yes/No Questions: Within Functional Limits Commands: Within Functional Limits Visual Recognition/Discrimination Discrimination: Not tested Reading Comprehension Reading Status: Impaired  Sentence Level: Impaired Paragraph Level: Impaired Functional Environmental (signs, name badge): Impaired Interfering Components: Eye glasses not available    Expression Expression Primary Mode of Expression: Verbal Verbal Expression Overall Verbal Expression: Appears within functional limits for tasks assessed Written Expression Dominant Hand: Right Written Expression: Not tested   Oral / Motor Oral Motor/Sensory Function Overall Oral Motor/Sensory Function: Appears within functional limits for tasks assessed Motor Speech Overall Motor Speech: Appears within functional limits for tasks assessed   GO    Moreen Fowler MS, CCC-SLP [phone] [facility] 12/05/2012, 1:58 PM

## 2012-12-05 NOTE — Progress Notes (Signed)
2 Days Post-Op  Subjective: A comm artery aneurysm coiling 5/23 Doing well  Objective: Vital signs in last 24 hours: Temp:  [97.4 F (36.3 C)-98.8 F (37.1 C)] 97.6 F (36.4 C) (05/25 0800) Pulse Rate:  [56-99] 58 (05/25 1000) Resp:  [11-22] 18 (05/25 1000) BP: (100-149)/(46-80) 116/55 mmHg (05/25 1000) SpO2:  [93 %-100 %] 97 % (05/25 1000) Arterial Line BP: (104-157)/(48-70) 137/59 mmHg (05/25 1000)    Intake/Output from previous day: 05/24 0701 - 05/25 0700 In: 3114.6 [P.O.:260; I.V.:2644.6; IV Piggyback:210] Out: 2027 [Urine:1755; Drains:272] Intake/Output this shift: Total I/O In: 470 [P.O.:220; I.V.:250] Out: 199 [Urine:175; Drains:24]  PE:  afeb VSS Appropriate; A/O Face sym; tongue midline Puffs cheeks = UOP good Off vent Moving all 4s; Rt foot slower Rt groin no hematoma Rt foot 2+ pulses   Lab Results:   Recent Labs  12/04/12 0400 12/05/12 0300  WBC 10.6* 17.4*  HGB 16.8 15.9  HCT 48.0 45.4  PLT 137* 134*   BMET  Recent Labs  12/04/12 0400 12/05/12 0300  NA 142 140  K 4.1 3.8  CL 110 109  CO2 22 25  GLUCOSE 153* 137*  BUN 17 23  CREATININE 0.66 0.49*  CALCIUM 8.1* 8.4   PT/INR  Recent Labs  12/03/12 0710  LABPROT 12.8  INR 0.97   ABG  Recent Labs  12/03/12 1619 12/04/12 0500  PHART 7.302* 7.381  HCO3 26.9* 24.5*    Studies/Results: Ct Head Wo Contrast  12/03/2012   *RADIOLOGY REPORT*  Clinical Data: Post aneurysm coiling for subarachnoid hemorrhage  CT HEAD WITHOUT CONTRAST  Technique:  Contiguous axial images were obtained from the base of the skull through the vertex without contrast.  Comparison: CT 12/03/2012  Findings: Interval coiling of anterior communicating artery aneurysm.  Large coil pack in satisfactory position and configuration.  Interval placement of ventricular catheter from   right frontal approach with the tip in the third ventricle.  Interval decompression of ventricles.  Severe subarachnoid hemorrhage.    There is increase in intraventricular blood compared with earlier CT.  There remains blood in the interventricular septum, extending into the inferior frontal lobe on the right which is similar.  IMPRESSION: Interval coiling of aneurysm in the region of the anterior communicating artery with a satisfactory appearance.  Interval placement of ventricular catheter with decompression of the ventricles.  Severe diffuse subarachnoid hemorrhage.  There is increase in ventricular blood which may be related to re bleeding since the prior CT.   Original Report Authenticated By: Janeece Riggers, M.D.   Dg Chest Port 1 View  12/04/2012   *RADIOLOGY REPORT*  Clinical Data: Ventilator dependent respiratory failure.  Follow up basilar atelectasis.  PORTABLE CHEST - 1 VIEW 12/04/2012 0550 hours:  Comparison: Portable chest x-ray yesterday.  Findings: Endotracheal tube tip in satisfactory position projecting approximately 6 cm above the carina.  Nasogastric courses below the diaphragm into the stomach.  Cardiac silhouette upper normal in size for technique.  Suboptimal inspiration with mild atelectasis in the bases, though aeration has improved slightly since yesterday.  Mild pulmonary venous hypertension without overt edema. No new pulmonary parenchymal abnormalities.  IMPRESSION: Support apparatus satisfactory.  Suboptimal inspiration accounts for mild bibasilar atelectasis, though aeration has improved slightly since yesterday.  No new abnormalities.   Original Report Authenticated By: Hulan Saas, M.D.   Dg Chest Port 1 View  12/03/2012   *RADIOLOGY REPORT*  Clinical Data: Endotracheal tube placement  PORTABLE CHEST - 1 VIEW  Comparison: 12/03/2012  Findings: Endotracheal tube is appropriately positioned. Nasogastric tube is coiled upon itself with tip not well visualized by approximately oriented back upon itself over the distal esophagus.  Heart size is upper limits of normal with central vascular congestion and overall  hypoaeration.  No new focal pulmonary opacity.  IMPRESSION: Interval placement of nasogastric tube, coiled upon itself, tip oriented over the approximate location of the distal esophagus. Repositioning is recommended.  Low lung volumes with curvilinear bibasilar probable atelectasis.   Original Report Authenticated By: Christiana Pellant, M.D.   Dg Abd Portable 1v  12/03/2012   *RADIOLOGY REPORT*  Clinical Data: NG tube placement  PORTABLE ABDOMEN - 1 VIEW  Comparison: None.  Findings: The tip of the NG tube is in the fundus of the stomach. No disproportionate dilatation of bowel.  No obvious free intraperitoneal gas.  IMPRESSION: NG tube tip is in the fundus of the stomach.   Original Report Authenticated By: Jolaine Click, M.D.    Anti-infectives: Anti-infectives   None      Assessment/Plan: s/p Procedure(s): RADIOLOGY WITH ANESTHESIA (N/A)  A com aneurysm coiling 5/23 Will follow Report to Der [facility]esh[facility]   LOS: 2 days    Dennis Wyatt A 12/05/2012

## 2012-12-05 NOTE — Progress Notes (Signed)
PULMONARY  / CRITICAL CARE MEDICINE  Name: Dennis Wyatt MRN: [id number] DOB: 1961/07/01    ADMISSION DATE:  12/03/2012 CONSULTATION DATE:  12/03/2012  REFERRING MD :  Dr. Yetta Barre PRIMARY SERVICE: Neuro surg  CHIEF COMPLAINT:  VDRF  BRIEF PATIENT DESCRIPTION: 52 year old male who was at work when noticed headache and co-workers noted change in his mental status.  Patient was taken to the ED where he was noted to have an aneurysm with SAH.  Patient was taken to IR, intubated and aneurysm was coiled.  Patient was brought to the ICU post coiling intubated and PCCM was asked to see on consultation.  SIGNIFICANT EVENTS / STUDIES:  5/23 S/P coiling of aneurysm.  LINES / TUBES: PIV ET tube 5/23>>>5-24  CULTURES: None  ANTIBIOTICS: Surgical prophylaxis only.   SUBJECTIVE:  Alert after extubation  VITAL SIGNS: Temp:  [97.4 F (36.3 C)-99.9 F (37.7 C)] 97.4 F (36.3 C) (05/25 0351) Pulse Rate:  [56-99] 65 (05/25 0500) Resp:  [11-22] 16 (05/25 0500) BP: (100-175)/(46-93) 128/80 mmHg (05/25 0500) SpO2:  [93 %-100 %] 97 % (05/25 0500) Arterial Line BP: (104-170)/(48-82) 143/59 mmHg (05/25 0500) FiO2 (%):  [39.6 %-40.9 %] 40.5 % (05/24 1000) HEMODYNAMICS:   VENTILATOR SETTINGS: Vent Mode:  [-] CPAP FiO2 (%):  [39.6 %-40.9 %] 40.5 % Set Rate:  [16 bmp] 16 bmp Vt Set:  [640 mL] 640 mL PEEP:  [5 cmH20] 5 cmH20 Pressure Support:  [5 cmH20] 5 cmH20 Plateau Pressure:  [18 cmH20] 18 cmH20 INTAKE / OUTPUT: Intake/Output     05/24 0701 - 05/25 0700 05/25 0701 - 05/26 0700   P.O. 260    I.V. (mL/kg) 2544.6 (22.1)    IV Piggyback 210    Total Intake(mL/kg) 3014.6 (26.2)    Urine (mL/kg/hr) 1755 (0.6)    Drains 262 (0.1)    Blood     Total Output 2017     Net +997.6          Emesis Occurrence 2 x      PHYSICAL EXAMINATION: General:  Well appearing, awake , follows commands Neuro:  Follows commands and maex 4. Mild confusion HEENT:  [place]/AT, PERRL, EOM-I and  -LAN/Thyromegally.IVC drain in place Cardiovascular:  RRR, Nl S1/S2, -M/R/G. Lungs:  CTA bilaterally. Abdomen:  Soft, NT, ND and +BS. Musculoskeletal:  -edema and -tenderness. Skin:  Intact.  LABS:  Recent Labs Lab 12/03/12 0710 12/03/12 0733 12/03/12 1619 12/04/12 0400 12/04/12 0500 12/05/12 0300  HGB 19.1* 19.4*  --  16.8  --  15.9  WBC 9.2  --   --  10.6*  --  17.4*  PLT 164  --   --  137*  --  134*  NA 142 142  --  142  --  140  K 3.9 3.9  --  4.1  --  3.8  CL 106 108  --  110  --  109  CO2 23  --   --  22  --  25  GLUCOSE 157* 146*  --  153*  --  137*  BUN 23 23  --  17  --  23  CREATININE 0.68 0.90  --  0.66  --  0.49*  CALCIUM 9.2  --   --  8.1*  --  8.4  MG  --   --   --  2.1  --   --   PHOS  --   --   --  2.7  --   --  AST 21  --   --   --   --   --   ALT 18  --   --   --   --   --   ALKPHOS 91  --   --   --   --   --   BILITOT 0.7  --   --   --   --   --   PROT 6.8  --   --   --   --   --   ALBUMIN 3.9  --   --   --   --   --   APTT 25  --   --   --   --   --   INR 0.97  --   --   --   --   --   PHART  --   --  7.302*  --  7.381  --   PCO2ART  --   --  54.4*  --  42.4  --   PO2ART  --   --  455.0*  --  81.8  --     Recent Labs Lab 12/03/12 0901 12/04/12 0753  GLUCAP 167* 134*    Imaging: All imaging studies reviewed   ASSESSMENT / PLAN:  PULMONARY A: VDRF post SAH and aneurysm coiling. P:    - Extubated 5-24 -titrate oxygen  CARDIOVASCULAR A: HTN history, BP varies widely at this time. Bp goal 120-140 P:  - Nimodipine  -cardene drip off 5-25 -added hydralazine 5-24 and change to po 5-25. Add ARB when off Nimotop. Gradual control of bp with ultimate goal <140 systolic and <90 diasytolic  RENAL A:  No active issues. P:   - BMET in AM and replace electrolytes as needed.  GASTROINTESTINAL A:  No active issues. P:   - Low na diet now extubated  HEMATOLOGIC  Recent Labs  12/04/12 0400 12/05/12 0300  HGB 16.8 15.9    A:   Elevated Hg, likely related to dehydration.  No history of polycythemia Resolved 5/24. P:  - IV hydration. - Resolved 5-24  INFECTIOUS A:  No active issues. P:   - Surgical prophylaxis only.  ENDOCRINE A:  No diabetes history.   P:   - - Monitor.  NEUROLOGIC A:  SAH and aneurysmal coiling of ACOM 5/23 by IR IVC drain still bloody P:   - Dexamethasone. - Keppra. - Further recs per neurosurg. -cont ivc drain  CC30 Dorcas Carrow Beeper  [phone]  Cell  [phone]  If no response or cell goes to voicemail, call beeper [phone]   12/05/2012, 7:25 AM

## 2012-12-06 DIAGNOSIS — I671 Cerebral aneurysm, nonruptured: Secondary | ICD-10-CM

## 2012-12-06 DIAGNOSIS — R739 Hyperglycemia, unspecified: Secondary | ICD-10-CM | POA: Diagnosis not present

## 2012-12-06 DIAGNOSIS — R7309 Other abnormal glucose: Secondary | ICD-10-CM

## 2012-12-06 LAB — BASIC METABOLIC PANEL
BUN: 25 mg/dL — ABNORMAL HIGH (ref 6–23)
CO2: 21 meq/L (ref 19–32)
Calcium: 8.2 mg/dL — ABNORMAL LOW (ref 8.4–10.5)
Chloride: 112 meq/L (ref 96–112)
Creatinine, Ser: 0.54 mg/dL (ref 0.50–1.35)
GFR calc Af Amer: >90 mL/min (ref >90)
GFR calc non Af Amer: >90 mL/min (ref >90)
Glucose, Bld: 119 mg/dL — ABNORMAL HIGH (ref 70–99)
Potassium: 3.5 mEq/L (ref 3.5–5.1)
Sodium: 143 mEq/L (ref 135–145)

## 2012-12-06 LAB — CBC
HCT: 46.7 % (ref 39.0–52.0)
Hemoglobin: 15.5 g/dL (ref 13.0–17.0)
MCH: 32 pg (ref 26.0–34.0)
MCHC: 33.2 g/dL (ref 30.0–36.0)
MCV: 96.3 fL (ref 78.0–100.0)
Platelets: 126 10*3/uL — ABNORMAL LOW (ref 150–400)
RBC: 4.85 MIL/uL (ref 4.22–5.81)
RDW: 13.5 % (ref 11.5–15.5)
WBC: 15.2 10*3/uL — ABNORMAL HIGH (ref 4.0–10.5)

## 2012-12-06 LAB — TRIGLYCERIDES: Triglycerides: 143 mg/dL (ref <150)

## 2012-12-06 MED ORDER — OXYCODONE HCL 5 MG PO TABS
5.0000 mg | ORAL_TABLET | ORAL | Status: DC | PRN
  Administered 2012-12-06 – 2012-12-08 (×3): 5 mg via ORAL
  Filled 2012-12-06 (×3): qty 1

## 2012-12-06 MED ORDER — POTASSIUM CHLORIDE CRYS ER 20 MEQ PO TBCR
20.0000 meq | EXTENDED_RELEASE_TABLET | Freq: Once | ORAL | Status: AC
  Administered 2012-12-06: 20 meq via ORAL
  Filled 2012-12-06: qty 1

## 2012-12-06 NOTE — Progress Notes (Signed)
Physical Therapy Treatment Patient Details Name: Dennis Wyatt MRN: [id number] DOB: 06/22/1961 Today's Date: 12/06/2012 Time: 1914-7829 PT Time Calculation (min): 28 min  PT Assessment / Plan / Recommendation Comments on Treatment Session  Patient progressing to tolerate out of bed activity today.  Still very confused and unable to recall short term re-orientation efforts.  Feel will need continued rehab at [facility] when ready to transition out of acute care.  Daughter present, supportive during treatment and will be able to provide 24 hour care upon d/c.    Follow Up Recommendations  CIR     Does the patient have the potential to tolerate intense rehabilitation   Yes  Barriers to Discharge  None      Equipment Recommendations  Rolling walker with 5" wheels       Frequency Min 4X/week   Plan Discharge plan remains appropriate;Frequency needs to be updated    Precautions / Restrictions Precautions Precautions: Fall Precaution Comments: Ventriculostomy drain must be clamped with mobility; wrist restraints and mitts   Pertinent Vitals/Pain C/o mild headache after up to chair    Mobility  Bed Mobility Supine to Sit: 4: Min assist Sitting - Scoot to Edge of Bed: 5: Supervision Transfers Sit to Stand: 1: +2 Total assist;From bed Sit to Stand: Patient Percentage: 70% Stand to Sit: To chair/3-in-1;1: +2 Total assist Stand to Sit: Patient Percentage: 70% Details for Transfer Assistance: cues for safety, assist and cues to lower into chair due to pt stating  "I'm just gonna flop down right here." Ambulation/Gait Ambulation/Gait Assistance: 1: +2 Total assist Ambulation/Gait: Patient Percentage: 70% Ambulation Distance (Feet): 1 Feet Assistive device: 2 person hand held assist Ambulation/Gait Assistance Details: pivoting steps to chair     PT Goals Acute Rehab PT Goals Pt will go Supine/Side to Sit: with supervision PT Goal: Supine/Side to Sit - Progress: Progressing toward  goal Pt will go Sit to Stand: with min assist PT Goal: Sit to Stand - Progress: Progressing toward goal Pt will go Stand to Sit: with min assist PT Goal: Stand to Sit - Progress: Progressing toward goal Pt will Ambulate: 51 - 150 feet;with min assist PT Goal: Ambulate - Progress: Progressing toward goal  Visit Information  Last PT Received On: 12/06/12 Assistance Needed: +2    Subjective Data  Subjective: I feel like ya'll got me drunk last night   Cognition  Cognition Arousal/Alertness: Awake/alert Behavior During Therapy: Impulsive Overall Cognitive Status: Impaired/Different from baseline Area of Impairment: Orientation;Attention;Memory;Safety/judgement;Problem solving Orientation Level: Place;Time;Situation Current Attention Level: Sustained Memory: Decreased short-term memory Safety/Judgement: Decreased awareness of safety;Decreased awareness of deficits Problem Solving: Slow processing    Balance  Static Sitting Balance Static Sitting - Balance Support: Feet supported;No upper extremity supported;Bilateral upper extremity supported Static Sitting - Level of Assistance: 5: Stand by assistance Static Sitting - Comment/# of Minutes: sat edge of bed 10 minutes; initially c/o dizziness sitting at edge of bed with needing UE support, then improved to be able to use hand for gestures during conversation  End of Session PT - End of Session Equipment Utilized During Treatment: Gait belt Activity Tolerance: Patient tolerated treatment well Patient left: in chair;with family/visitor present;with restraints reapplied;with nursing in room   GP     [facility] 12/06/2012, 10:58 AM Sheran Lawless, PT [phone] 12/06/2012

## 2012-12-06 NOTE — Progress Notes (Signed)
Physical Therapy Treatment Patient Details Name: Dennis Wyatt MRN: [id number] DOB: 1960-12-02 Today's Date: 12/06/2012 Time: 1030-1040 PT Time Calculation (min): 10 min  PT Assessment / Plan / Recommendation Comments on Treatment Session  Assisted nursing to get pt back to bed due to c/o headache with vetntricular drain clamped.  Will need more help to mobilize until free of critical lines.    Follow Up Recommendations  CIR     Does the patient have the potential to tolerate intense rehabilitation   Yes  Barriers to Discharge  None      Equipment Recommendations  Rolling walker with 5" wheels       Frequency Min 4X/week   Plan Discharge plan remains appropriate;Frequency remains appropriate    Precautions / Restrictions Precautions Precautions: Fall Precaution Comments: Ventriculostomy drain must be clamped with mobility; wrist restraints and mitts   Pertinent Vitals/Pain Increased c/o headache RN aware    Mobility  Bed Mobility SSit to Supine: 4: Min assist Details for Bed Mobility Assistance: extra hands in room to help keep arterial line and ventricular drain safe Transfers Sit to Stand: 1: +2 Total assist;From chair/3-in-1 Sit to Stand: Patient Percentage: 70% Stand to Sit: To bed;1: +2 Total assist Stand to Sit: Patient Percentage: 70% Details for Transfer Assistance: for safety, needed two attempts to stand to clear hips from chair due to low surface, pushes up from chair with hands on armrest Ambulation/Gait Ambulation/Gait Assistance: 1: +2 Total assist Ambulation/Gait: Patient Percentage: 70% Ambulation Distance (Feet): 2 Feet Assistive device: 2 person hand held assist Ambulation/Gait Assistance Details: from chair to bed     PT Goals Acute Rehab PT Goals Pt will go Sit to Supine/Side: with supervision PT Goal: Sit to Supine/Side - Progress: Progressing toward goal Pt will go Sit to Stand: with min assist PT Goal: Sit to Stand - Progress: Progressing  toward goal Pt will go Stand to Sit: with min assist PT Goal: Stand to Sit - Progress: Progressing toward goal Pt will Ambulate: 51 - 150 feet;with min assist PT Goal: Ambulate - Progress: Progressing toward goal  Visit Information  Last PT Received On: 12/06/12 Assistance Needed: +2    Subjective Data  Subjective: I feel like ya'll got me drunk last night   Cognition  Cognition Arousal/Alertness: Awake/alert Behavior During Therapy: Impulsive Overall Cognitive Status: Impaired/Different from baseline Area of Impairment: Orientation;Attention;Memory;Safety/judgement;Problem solving Orientation Level: Place;Time;Situation Current Attention Level: Sustained Memory: Decreased short-term memory Safety/Judgement: Decreased awareness of safety;Decreased awareness of deficits Problem Solving: Slow processing    Balance  Static Sitting Balance Static Sitting - Balance Support: Feet supported;No upper extremity supported;Bilateral upper extremity supported Static Sitting - Level of Assistance: 5: Stand by assistance Static Sitting - Comment/# of Minutes: sat edge of bed 10 minutes; initially c/o dizziness sitting at edge of bed with needing UE support, then improved to be able to use hand for gestures during conversation  End of Session PT - End of Session Equipment Utilized During Treatment: Gait belt Activity Tolerance: Patient limited by pain Patient left: in bed;with call bell/phone within reach;with nursing in room;with family/visitor present Nurse Communication: Patient requests pain meds   GP     [facility] 12/06/2012, 11:05 AM Sheran Lawless, PT [phone] 12/06/2012

## 2012-12-06 NOTE — Progress Notes (Signed)
Rehab Admissions Coordinator Note:  Patient was screened by Trish Mage for appropriateness for an Inpatient Acute Rehab Consult.  At this time, we are recommending Inpatient Rehab consult.  Dennis Wyatt M 12/06/2012, 8:24 AM  I can be reached at [phone].

## 2012-12-06 NOTE — Progress Notes (Signed)
Patient ID: Lindberg Zenon, male   DOB: Feb 17, 1961, 52 y.o.   MRN: [id number] Subjective:  The patient is alert and pleasant. He was confused last night but seems better this morning. He has no complaints.  Objective: Vital signs in last 24 hours: Temp:  [97.4 F (36.3 C)-98.7 F (37.1 C)] 98.7 F (37.1 C) (05/26 0400) Pulse Rate:  [51-101] 54 (05/26 0600) Resp:  [11-20] 13 (05/26 0600) BP: (109-145)/(38-106) 126/53 mmHg (05/26 0600) SpO2:  [91 %-98 %] 98 % (05/26 0600) Arterial Line BP: (129-166)/(51-69) 163/65 mmHg (05/26 0600)  Intake/Output from previous day: 05/25 0701 - 05/26 0700 In: 5310 [P.O.:1700; I.V.:3400; IV Piggyback:210] Out: 2319 [Urine:2015; Drains:304] Intake/Output this shift: Total I/O In: -  Out: 200 [Urine:200]  Physical exam the patient is alert and oriented x2 (person, a hospital) he is moving all 4 extremities well. His speech is normal. His ventriculostomy is patent and draining.  Lab Results:  Recent Labs  12/05/12 0300 12/06/12 0610  WBC 17.4* 15.2*  HGB 15.9 15.5  HCT 45.4 46.7  PLT 134* 126*   BMET  Recent Labs  12/05/12 0300 12/06/12 0610  NA 140 143  K 3.8 3.5  CL 109 112  CO2 25 21  GLUCOSE 137* 119*  BUN 23 25*  CREATININE 0.49* 0.54  CALCIUM 8.4 8.2*    Studies/Results: No results found.  Assessment/Plan: Aneurysmal subarachnoid hemorrhage/hydrocephalus: The patient is doing well neurologically. We will continue his ventriculostomy.  LOS: 3 days     Eduard Penkala D 12/06/2012, 7:50 AM

## 2012-12-06 NOTE — Progress Notes (Signed)
PULMONARY  / CRITICAL CARE MEDICINE  Name: Dennis Wyatt MRN: [id number] DOB: December 31, 1960    ADMISSION DATE:  12/03/2012 CONSULTATION DATE:  12/03/2012  REFERRING MD :  Dr. Yetta Barre PRIMARY SERVICE: Neuro surg  CHIEF COMPLAINT:  VDRF  BRIEF PATIENT DESCRIPTION:  52 yo male smoker admitted with headache and mental status change 2nd to [facility] with aneurysm.  Had neuro-IR intervention, and remained on vent post-procedure.  PCCM consulted for vent/medical management.  SIGNIFICANT EVENTS:  5/23 S/P coiling of aneurysm, IVC drain  STUDIES: 5/23 CT head >> Large SAH  LINES / TUBES: ET tube 5/23>>>5/24 IVC drain 5/23 >> Lt radial aline 5/23 >> 5/26  SUBJECTIVE:  C/o headache.  Denies blurred vision, nausea, chest pain, dyspnea, abd pain.  VITAL SIGNS: Temp:  [97.4 F (36.3 C)-98.7 F (37.1 C)] 98.7 F (37.1 C) (05/26 0400) Pulse Rate:  [51-101] 56 (05/26 0800) Resp:  [11-20] 16 (05/26 0800) BP: (109-145)/(38-106) 136/58 mmHg (05/26 0800) SpO2:  [91 %-98 %] 97 % (05/26 0800) Arterial Line BP: (118-166)/(51-111) 118/111 mmHg (05/26 0800) 2 liter [place]  INTAKE / OUTPUT: Intake/Output     05/25 0701 - 05/26 0700 05/26 0701 - 05/27 0700   P.O. 1700    I.V. (mL/kg) 3400 (29.5) 300 (2.6)   IV Piggyback 210    Total Intake(mL/kg) 5310 (46.1) 300 (2.6)   Urine (mL/kg/hr) 2015 (0.7) 200 (1.3)   Drains 304 (0.1) 6 (0)   Total Output 2319 206   Net +2991 +94        Urine Occurrence 1 x      PHYSICAL EXAMINATION: General: No distress Neuro: Alert, follows commands, oriented to person/place, slight weakness noted Rt lower extremity >> moves all other extremities HEENT: Pupils reactive, IVC in place Cardiovascular: regular, no murmur Lungs: no wheeze Abdomen:  Soft, non tender Musculoskeletal: no edema Skin: no rashes  LABS:  Recent Labs Lab 12/03/12 0710 12/03/12 0733 12/03/12 1619 12/04/12 0400 12/04/12 0500 12/05/12 0300 12/06/12 0610  HGB 19.1* 19.4*  --  16.8  --  15.9  15.5  WBC 9.2  --   --  10.6*  --  17.4* 15.2*  PLT 164  --   --  137*  --  134* 126*  NA 142 142  --  142  --  140 143  K 3.9 3.9  --  4.1  --  3.8 3.5  CL 106 108  --  110  --  109 112  CO2 23  --   --  22  --  25 21  GLUCOSE 157* 146*  --  153*  --  137* 119*  BUN 23 23  --  17  --  23 25*  CREATININE 0.68 0.90  --  0.66  --  0.49* 0.54  CALCIUM 9.2  --   --  8.1*  --  8.4 8.2*  MG  --   --   --  2.1  --   --   --   PHOS  --   --   --  2.7  --   --   --   AST 21  --   --   --   --   --   --   ALT 18  --   --   --   --   --   --   ALKPHOS 91  --   --   --   --   --   --   BILITOT 0.7  --   --   --   --   --   --  PROT 6.8  --   --   --   --   --   --   ALBUMIN 3.9  --   --   --   --   --   --   APTT 25  --   --   --   --   --   --   INR 0.97  --   --   --   --   --   --   PHART  --   --  7.302*  --  7.381  --   --   PCO2ART  --   --  54.4*  --  42.4  --   --   PO2ART  --   --  455.0*  --  81.8  --   --     Recent Labs Lab 12/03/12 0901 12/04/12 0753  GLUCAP 167* 134*    Imaging: No results found.    ASSESSMENT / PLAN:   NEUROLOGIC A: SAH 2nd to ACOM aneurysm s/p coiling 5/23 by IR, and IVC drain. P:   -continue keppra per neurosurgery -IVC drain per neurosurgery -continue nimotop -continue HHH therapy >> BP goal 120 to 140 -prn tylenol, oxycodone, morphine for headache  CARDIOVASCULAR A: Hx of HTN. P:  -d/c aline -continue hydralazine -prn labetalol  GASTROINTESTINAL A: Nutrition. P:   -heart healthy diet  HEMATOLOGIC A: Thrombocytopenia. P:  -f/u CBC  ENDOCRINE A: Hyperglycemia >> no hx of DM. P:   -check HbA1C -monitor blood sugar on BMET  Resolved problems >> Post-procedure VDRF, 2nd polycythemia.  Brett Canales Minor ACNP [facility] PCCM Pager [phone] till 3 pm If no answer page [phone] 12/06/2012, 8:22 AM  Dennis Helling, MD [facility] Pulmonary/Critical Care 12/06/2012, 1:46 PM Pager:  [phone] After 3pm call: [phone]

## 2012-12-06 NOTE — Evaluation (Signed)
Occupational Therapy Evaluation Patient Details Name: Dennis Wyatt MRN: [id number] DOB: 1960-10-20 Today's Date: 12/06/2012 Time: 0272-5366 OT Time Calculation (min): 27 min  OT Assessment / Plan / Recommendation Clinical Impression  Pt admitted with [facility] from anterior communicating artery aneurysm, hydrocephalus with ventriculostomy in place.  Evaluation limited by multiple lines.  Pt presents with significant cognitive impairment and HA.  Requires +2 assist for OOB mobility.  Will follow acutely.    OT Assessment  Patient needs continued OT Services    Follow Up Recommendations  CIR    Barriers to Discharge      Equipment Recommendations   (TBD)    Recommendations for Other Services Rehab consult  Frequency  Min 3X/week    Precautions / Restrictions Precautions Precautions: Fall Precaution Comments: Ventriculostomy drain must be clamped with mobility; wrist restraints and mitts   Pertinent Vitals/Pain HA, RN notified, repositioned    ADL  Eating/Feeding: Set up Where Assessed - Eating/Feeding: Chair Upper Body Dressing: Maximal assistance Where Assessed - Upper Body Dressing: Unsupported sitting Transfers/Ambulation Related to ADLs: +2 total assist pt 70% bed <> chair ADL Comments: Evaluation limited due to multiple line and ventriculostomy.  Focus of session on early mobility.    OT Diagnosis: Generalized weakness;Cognitive deficits;Acute pain  OT Problem List: Decreased strength;Decreased activity tolerance;Impaired balance (sitting and/or standing);Decreased cognition;Decreased safety awareness;Decreased knowledge of use of DME or AE;Obesity;Pain OT Treatment Interventions: Self-care/ADL training;DME and/or AE instruction;Therapeutic activities;Cognitive remediation/compensation;Patient/family education;Balance training   OT Goals Acute Rehab OT Goals OT Goal Formulation: With patient Time For Goal Achievement: 12/20/12 Potential to Achieve Goals: Good ADL  Goals Pt Will Perform Grooming: with supervision;Standing at sink ADL Goal: Grooming - Progress: Goal set today Pt Will Perform Upper Body Bathing: with supervision;Sitting at sink ADL Goal: Upper Body Bathing - Progress: Goal set today Pt Will Perform Lower Body Bathing: with min assist;Sit to stand from chair;Sitting at sink ADL Goal: Lower Body Bathing - Progress: Goal set today Pt Will Perform Upper Body Dressing: with supervision;Sitting, bed ADL Goal: Upper Body Dressing - Progress: Goal set today Pt Will Perform Lower Body Dressing: with min assist;Sit to stand from bed ADL Goal: Lower Body Dressing - Progress: Goal set today Pt Will Transfer to Toilet: with supervision;Ambulation ADL Goal: Toilet Transfer - Progress: Goal set today Pt Will Perform Toileting - Clothing Manipulation: with supervision;Standing ADL Goal: Toileting - Clothing Manipulation - Progress: Goal set today Pt Will Perform Toileting - Hygiene: with supervision;Sit to stand from 3-in-1/toilet ADL Goal: Toileting - Hygiene - Progress: Goal set today Miscellaneous OT Goals Miscellaneous OT Goal #1: Pt will use environmental cues for orientation to place and time. OT Goal: Miscellaneous Goal #1 - Progress: Goal set today Miscellaneous OT Goal #2: Pt will request assist for OOB/OOC mobility with minimal reminders. 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, prison and probation services Communication: No difficulties Dominant Hand: Right         Vision/Perception Vision - History Baseline Vision:  Wears glasses all the time Patient Visual Report: No change from baseline   Cognition  Cognition Arousal/Alertness: Awake/alert Behavior During Therapy: Impulsive Overall Cognitive Status: Impaired/Different from baseline Area of Impairment: Orientation;Attention;Memory;Safety/judgement;Problem solving Orientation Level: Place;Time;Situation Current Attention Level: Sustained Memory: Decreased short-term memory Safety/Judgement: Decreased awareness of safety;Decreased awareness of deficits Problem Solving: Slow processing    Extremity/Trunk Assessment Right Upper Extremity Assessment RUE ROM/Strength/Tone: WFL for tasks assessed;Unable to fully assess (due to lines) Left Upper Extremity Assessment LUE ROM/Strength/Tone: [facility] for tasks assessed;Unable to fully assess (due to lines) Trunk Assessment Trunk Assessment: Normal     Mobility Bed Mobility Supine to Sit: 4: Min assist Sitting - Scoot to Edge of Bed: 5: Supervision Sit to Supine: 4: Min assist Details for Bed Mobility Assistance: extra hands in room to help keep arterial line and ventricular drain safe Transfers Sit to Stand: 1: +2 Total assist;From chair/3-in-1 Sit to Stand: Patient Percentage: 70% Stand to Sit: To bed;1: +2 Total assist Stand to Sit: Patient Percentage: 70% Details for Transfer Assistance: for safety, needed two attempts to stand to clear hips from chair due to low surface, pushes up from chair with hands on armrest     Exercise     Balance Static Sitting Balance Static Sitting - Balance Support: Feet supported;No upper extremity supported;Bilateral upper extremity supported Static Sitting - Level of Assistance: 5: Stand by assistance Static Sitting - Comment/# of Minutes: sat edge of bed 10 minutes; initially c/o dizziness  sitting at edge of bed with needing UE support, then improved to be able to use hand for gestures during conversation   End of Session OT - End of Session Activity Tolerance: Patient limited by pain Patient left: in chair;with call bell/phone within reach;with family/visitor present Nurse Communication: Mobility status;Precautions  GO     Evern Bio 12/06/2012, 11:40 AM [phone]

## 2012-12-07 LAB — BASIC METABOLIC PANEL
BUN: 18 mg/dL (ref 6–23)
CO2: 23 mEq/L (ref 19–32)
Calcium: 8.4 mg/dL (ref 8.4–10.5)
Chloride: 110 meq/L (ref 96–112)
Creatinine, Ser: 0.51 mg/dL (ref 0.50–1.35)
GFR calc Af Amer: >90 mL/min (ref >90)
GFR calc non Af Amer: >90 mL/min (ref >90)
Glucose, Bld: 105 mg/dL — ABNORMAL HIGH (ref 70–99)
Potassium: 4.7 meq/L (ref 3.5–5.1)
Sodium: 142 mEq/L (ref 135–145)

## 2012-12-07 LAB — CBC
HCT: 46.3 % (ref 39.0–52.0)
Hemoglobin: 15.8 g/dL (ref 13.0–17.0)
MCH: 32.4 pg (ref 26.0–34.0)
MCHC: 34.1 g/dL (ref 30.0–36.0)
MCV: 94.9 fL (ref 78.0–100.0)
Platelets: 131 10*3/uL — ABNORMAL LOW (ref 150–400)
RBC: 4.88 MIL/uL (ref 4.22–5.81)
RDW: 13.6 % (ref 11.5–15.5)
WBC: 11.3 10*3/uL — ABNORMAL HIGH (ref 4.0–10.5)

## 2012-12-07 LAB — MAGNESIUM: Magnesium: 2 mg/dL (ref 1.5–2.5)

## 2012-12-07 LAB — HEMOGLOBIN A1C
Hgb A1c MFr Bld: 5.4 % (ref <5.7)
Mean Plasma Glucose: 108 mg/dL (ref <117)

## 2012-12-07 LAB — PHOSPHORUS: Phosphorus: 2.4 mg/dL (ref 2.3–4.6)

## 2012-12-07 MED ORDER — NIMODIPINE 30 MG PO CAPS
60.0000 mg | ORAL_CAPSULE | ORAL | Status: DC
  Administered 2012-12-07 – 2012-12-13 (×35): 60 mg via ORAL
  Filled 2012-12-07 (×40): qty 2

## 2012-12-07 MED ORDER — ENALAPRILAT 1.25 MG/ML IV SOLN
0.6250 mg | Freq: Four times a day (QID) | INTRAVENOUS | Status: DC | PRN
  Filled 2012-12-07: qty 0.5

## 2012-12-07 MED FILL — Medication: Qty: 1 | Status: AC

## 2012-12-07 NOTE — Progress Notes (Signed)
4 Days Post-Op  Subjective: A com aneurysm coiled 5/23 Pt better daily headache  Objective: Vital signs in last 24 hours: Temp:  [97.5 F (36.4 C)-98 F (36.7 C)] 98 F (36.7 C) (05/27 0735) Pulse Rate:  [48-65] 54 (05/27 0700) Resp:  [11-18] 16 (05/27 0700) BP: (123-159)/(48-76) 156/75 mmHg (05/27 0700) SpO2:  [93 %-96 %] 96 % (05/27 0700) Arterial Line BP: (117-168)/(57-112) 134/88 mmHg (05/26 1400) Weight:  [274 lb 0.5 oz (124.3 kg)] 274 lb 0.5 oz (124.3 kg) (05/27 0400)    Intake/Output from previous day: 05/26 0701 - 05/27 0700 In: 5340 [P.O.:1380; I.V.:3750; IV Piggyback:210] Out: 3409 [Urine:3120; Drains:289] Intake/Output this shift: Total I/O In: 150 [I.V.:150] Out: 10 [Drains:10]  PE: afeb; vss A/O Face sym; tongue midline Smile = Appropriate Moves all 4s; Rt leg/foot weaker IVC in place; bloody but lighter  Lab Results:   Recent Labs  12/06/12 0610 12/07/12 0540  WBC 15.2* 11.3*  HGB 15.5 15.8  HCT 46.7 46.3  PLT 126* 131*   BMET  Recent Labs  12/06/12 0610 12/07/12 0540  NA 143 142  K 3.5 4.7  CL 112 110  CO2 21 23  GLUCOSE 119* 105*  BUN 25* 18  CREATININE 0.54 0.51  CALCIUM 8.2* 8.4   PT/INR No results found for this basename: LABPROT, INR,  in the last 72 hours ABG No results found for this basename: PHART, PCO2, PO2, HCO3,  in the last 72 hours  Studies/Results: No results found.  Anti-infectives: Anti-infectives   None      Assessment/Plan: s/p Procedure(s): RADIOLOGY WITH ANESTHESIA (N/A)  A COM aneurysm coiling 5/23 Better daily  LOS: 4 days    Dennis Wyatt A 12/07/2012

## 2012-12-07 NOTE — Progress Notes (Signed)
Patient ID: Dennis Wyatt, male   DOB: [birth year], 52 y.o.   MRN: [id number] BP 172/57  Pulse 56  Temp(Src) 98.4 F (36.9 C) (Oral)  Resp 14  Ht 6\' 1"  (1.854 m)  Wt 124.3 kg (274 lb 0.5 oz)  BMI 36.16 kg/m2  SpO2 95% Alert and oriented to person, confused, does not know place, time, situation Following commands, perrl, fulleom Moving all extremities Will discontinue blood pressure parameters as they are currently. Would like bp to be higher (<180 systolic) to prevent vasospasm. Aneurysm is now controlled.

## 2012-12-07 NOTE — Progress Notes (Signed)
PULMONARY  / CRITICAL CARE MEDICINE  Name: Roddy Bellamy MRN: [id number] DOB: 1960-11-30    ADMISSION DATE:  12/03/2012 CONSULTATION DATE:  12/03/2012  REFERRING MD :  Dr. Yetta Barre PRIMARY SERVICE: Neuro surg  CHIEF COMPLAINT:  VDRF  BRIEF PATIENT DESCRIPTION:  52 yo male smoker admitted with headache and mental status change 2nd to [facility] with aneurysm.  Had neuro-IR intervention, and remained on vent post-procedure.  PCCM consulted for vent/medical management.  SIGNIFICANT EVENTS:  5/23 S/P coiling of aneurysm, IVC drain  STUDIES: 5/23 CT head >> Large SAH  LINES / TUBES: ET tube 5/23>>>5/24 IVC drain 5/23 >> Lt radial aline 5/23 >> 5/26  SUBJECTIVE:  C/o HA, interacting appropriately  VITAL SIGNS: Temp:  [97.5 F (36.4 C)-98 F (36.7 C)] 98 F (36.7 C) (05/27 0735) Pulse Rate:  [48-65] 53 (05/27 0859) Resp:  [11-18] 14 (05/27 0859) BP: (123-169)/(48-76) 152/71 mmHg (05/27 0859) SpO2:  [93 %-96 %] 96 % (05/27 0859) Arterial Line BP: (117-168)/(57-109) 134/88 mmHg (05/26 1400) Weight:  [124.3 kg (274 lb 0.5 oz)] 124.3 kg (274 lb 0.5 oz) (05/27 0400) 2 liter [place]  INTAKE / OUTPUT: Intake/Output     05/26 0701 - 05/27 0700 05/27 0701 - 05/28 0700   P.O. 1380    I.V. (mL/kg) 3750 (30.2) 300 (2.4)   IV Piggyback 210    Total Intake(mL/kg) 5340 (43) 300 (2.4)   Urine (mL/kg/hr) 3120 (1) 200 (0.7)   Drains 289 (0.1) 25 (0.1)   Total Output 3409 225   Net +1931 +75          PHYSICAL EXAMINATION: General: No distress Neuro: Alert, follows commands, oriented to person/place, slight weakness noted Rt lower extremity >> moves all other extremities HEENT: Pupils reactive, IVC in place Cardiovascular: regular, no murmur Lungs: no wheeze Abdomen:  Soft, non tender Musculoskeletal: no edema Skin: no rashes  LABS:  Recent Labs Lab 12/03/12 0710 12/03/12 0733 12/03/12 1619 12/04/12 0400 12/04/12 0500 12/05/12 0300 12/06/12 0610 12/07/12 0540  HGB 19.1* 19.4*  --   16.8  --  15.9 15.5 15.8  WBC 9.2  --   --  10.6*  --  17.4* 15.2* 11.3*  PLT 164  --   --  137*  --  134* 126* 131*  NA 142 142  --  142  --  140 143 142  K 3.9 3.9  --  4.1  --  3.8 3.5 4.7  CL 106 108  --  110  --  109 112 110  CO2 23  --   --  22  --  25 21 23   GLUCOSE 157* 146*  --  153*  --  137* 119* 105*  BUN 23 23  --  17  --  23 25* 18  CREATININE 0.68 0.90  --  0.66  --  0.49* 0.54 0.51  CALCIUM 9.2  --   --  8.1*  --  8.4 8.2* 8.4  MG  --   --   --  2.1  --   --   --  2.0  PHOS  --   --   --  2.7  --   --   --  2.4  AST 21  --   --   --   --   --   --   --   ALT 18  --   --   --   --   --   --   --   [facility]  91  --   --   --   --   --   --   --   BILITOT 0.7  --   --   --   --   --   --   --   PROT 6.8  --   --   --   --   --   --   --   ALBUMIN 3.9  --   --   --   --   --   --   --   APTT 25  --   --   --   --   --   --   --   INR 0.97  --   --   --   --   --   --   --   PHART  --   --  7.302*  --  7.381  --   --   --   PCO2ART  --   --  54.4*  --  42.4  --   --   --   PO2ART  --   --  455.0*  --  81.8  --   --   --     Recent Labs Lab 12/03/12 0901 12/04/12 0753  GLUCAP 167* 134*    Imaging: No results found.    ASSESSMENT / PLAN:   NEUROLOGIC A: SAH 2nd to ACOM aneurysm s/p coiling 5/23 by IR, and IVC drain. P:   -continue keppra per neurosurgery -IVC drain per neurosurgery, not in a position to remove at this time -continue nimotop -continue HHH therapy >> BP goal 120 to 140 -prn tylenol, oxycodone, morphine for headache  CARDIOVASCULAR A: Hx of HTN. P:  -continue hydralazine -prn labetalol  GASTROINTESTINAL A: Nutrition. P:   -heart healthy diet  HEMATOLOGIC A: Thrombocytopenia. P:  -f/u CBC  ENDOCRINE A: Hyperglycemia >> no hx of DM. P:   -check HbA1C -monitor blood sugar on BMET  Resolved problems >> Post-procedure VDRF, 2nd polycythemia.  Levy Pupa, MD, PhD 12/07/2012, 9:30 AM [facility] [phone] or if no answer [phone]

## 2012-12-07 NOTE — Progress Notes (Signed)
PT Cancellation Note  Patient Details Name: Dennis Wyatt MRN: [id number] DOB: 1961/03/26   Cancelled Treatment:    Reason Eval/Treat Not Completed: Medical issues which prohibited therapy; pt with uncontrolled head ache today.  RN advised to hold therapy for today.  Will try again tomorrow.   WYNN,CYNDI 12/07/2012, 4:35 PM

## 2012-12-08 ENCOUNTER — Encounter (HOSPITAL_COMMUNITY): Payer: Self-pay | Admitting: Interventional Radiology

## 2012-12-08 ENCOUNTER — Inpatient Hospital Stay (HOSPITAL_COMMUNITY): Payer: No Typology Code available for payment source

## 2012-12-08 DIAGNOSIS — I609 Nontraumatic subarachnoid hemorrhage, unspecified: Secondary | ICD-10-CM

## 2012-12-08 LAB — CBC
HCT: 47.2 % (ref 39.0–52.0)
Hemoglobin: 16.2 g/dL (ref 13.0–17.0)
MCH: 32.5 pg (ref 26.0–34.0)
MCHC: 34.3 g/dL (ref 30.0–36.0)
MCV: 94.8 fL (ref 78.0–100.0)
Platelets: 148 10*3/uL — ABNORMAL LOW (ref 150–400)
RBC: 4.98 MIL/uL (ref 4.22–5.81)
RDW: 13 % (ref 11.5–15.5)
WBC: 13.1 10*3/uL — ABNORMAL HIGH (ref 4.0–10.5)

## 2012-12-08 LAB — BASIC METABOLIC PANEL
BUN: 15 mg/dL (ref 6–23)
CO2: 28 mEq/L (ref 19–32)
Calcium: 8.9 mg/dL (ref 8.4–10.5)
Chloride: 105 meq/L (ref 96–112)
Creatinine, Ser: 0.73 mg/dL (ref 0.50–1.35)
GFR calc Af Amer: >90 mL/min (ref >90)
GFR calc non Af Amer: >90 mL/min (ref >90)
Glucose, Bld: 101 mg/dL — ABNORMAL HIGH (ref 70–99)
Potassium: 4.7 meq/L (ref 3.5–5.1)
Sodium: 142 meq/L (ref 135–145)

## 2012-12-08 LAB — PHOSPHORUS: Phosphorus: 2.8 mg/dL (ref 2.3–4.6)

## 2012-12-08 LAB — MAGNESIUM: Magnesium: 2.1 mg/dL (ref 1.5–2.5)

## 2012-12-08 MED ORDER — CEFAZOLIN SODIUM 1-5 GM-% IV SOLN
1.0000 g | Freq: Three times a day (TID) | INTRAVENOUS | Status: DC
  Administered 2012-12-08 – 2012-12-12 (×11): 1 g via INTRAVENOUS
  Filled 2012-12-08 (×12): qty 50

## 2012-12-08 MED ORDER — MIDAZOLAM HCL 2 MG/2ML IJ SOLN
INTRAMUSCULAR | Status: AC
  Administered 2012-12-08: 3 mg
  Filled 2012-12-08: qty 4

## 2012-12-08 MED ORDER — OXYCODONE HCL 5 MG PO TABS
10.0000 mg | ORAL_TABLET | ORAL | Status: DC | PRN
  Administered 2012-12-08 – 2012-12-17 (×24): 10 mg via ORAL
  Filled 2012-12-08: qty 2
  Filled 2012-12-08: qty 1
  Filled 2012-12-08 (×18): qty 2
  Filled 2012-12-08: qty 1
  Filled 2012-12-08 (×2): qty 2
  Filled 2012-12-08: qty 1
  Filled 2012-12-08 (×2): qty 2

## 2012-12-08 NOTE — Progress Notes (Signed)
Pt. Pulled out his IVC drain. MD notified. Stat CT ordered.

## 2012-12-08 NOTE — Consult Note (Signed)
Physical Medicine and Rehabilitation Consult Reason for Consult: [facility] Referring Physician: Dr. Mikal Plane   HPI: Dennis Wyatt is a 52 y.o. right-handed male with history of hypertension. Admitted 12/03/2012 with headache and dizziness. Blood pressure noted to be 204/123. Cranial CT scan showed subarachnoid hemorrhage with concern for ruptured aneurysm. Four-vessel cerebral arteriogram showed 11 mm x 7 mm irregular right ACA A1 aneurysm. Interventional radiology consulted and patient underwent coiling of aneurysm, IVC drain. Patient did remain intubated for a time followed by critical care medicine. Close monitoring of blood pressure as patient remained on nimotop. He is tolerating a regular consistency diet. Physical and occupational therapy evaluations completed with noted bouts of confusion. Recommendations have been made for physical medicine rehabilitation consult to consider inpatient rehabilitation services   Review of Systems  Eyes: Positive for photophobia.  Neurological: Positive for dizziness and headaches.  All other systems reviewed and are negative.   Past Medical History  Diagnosis Date  . Hypertension    History reviewed. No pertinent past surgical history. History reviewed. No pertinent family history. Social History:  reports that he has been smoking.  He does not have any smokeless tobacco history on file. He reports that  drinks alcohol. He reports that he does not use illicit drugs. Allergies: No Known Allergies Medications Prior to Admission  Medication Sig Dispense Refill  . PRESCRIPTION MEDICATION Take 1 tablet by mouth daily. 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 Functional Status:  Mobility: Bed Mobility Bed Mobility: Supine to Sit;Sitting - Scoot to [place] of Bed;Sit to Supine Supine to Sit: 4: Min assist Supine to Sit: Patient Percentage: 70% Sitting - Scoot to Edge of Bed: 5: Supervision Sit to Supine: 4: Min assist Sit to Supine: Patient Percentage: 80% Transfers Transfers: Sit to Stand;Stand to Sit Sit to Stand: 1: +2 Total assist;From chair/3-in-1 Sit to Stand: Patient Percentage: 70% Stand to Sit: To bed;1: +2 Total assist Stand to Sit: Patient Percentage: 70% Ambulation/Gait Ambulation/Gait Assistance: 1: +2 Total assist Ambulation/Gait: Patient Percentage: 70% Ambulation Distance (Feet): 2 Feet Assistive device: 2 person hand held assist Ambulation/Gait Assistance Details: from chair to bed    ADL: ADL Eating/Feeding: Set up Where Assessed - Eating/Feeding: Chair Upper Body Dressing: Maximal assistance Where Assessed - Upper Body Dressing: Unsupported sitting Transfers/Ambulation Related to ADLs: +2 total assist pt 70% bed <> chair ADL Comments: Evaluation limited due to multiple line and ventriculostomy.  Focus of session on early mobility.  Cognition: Cognition Overall Cognitive Status: Impaired/Different from baseline Arousal/Alertness: Awake/alert Orientation Level: Oriented to person;Oriented to time Attention: Focused;Sustained Focused Attention: Impaired Focused Attention Impairment: Verbal basic;Functional basic Sustained Attention: Impaired Sustained Attention Impairment: Verbal basic;Functional basic Memory: Impaired Memory Impairment: Storage deficit;Retrieval deficit;Decreased recall of new information;Decreased long term memory;Decreased short term memory Decreased Long Term Memory: Verbal basic;Verbal complex;Functional basic Decreased Short Term Memory: Verbal basic;Verbal complex;Functional basic Awareness: Impaired Awareness Impairment:  Intellectual impairment;Emergent impairment;Anticipatory impairment Problem Solving: Impaired Problem Solving Impairment: Verbal basic;Functional basic Executive Function: Reasoning;Sequencing;Organizing;Decision Making;Self Monitoring;Initiating Reasoning: Impaired Reasoning Impairment: Verbal basic;Functional basic Sequencing: Impaired Sequencing Impairment: Verbal basic;Functional basic Organizing: Impaired Organizing Impairment: Verbal basic;Functional basic Decision Making: Impaired Decision Making Impairment: Verbal basic;Functional  basic Initiating: Impaired Initiating Impairment: Verbal basic;Functional basic Self Monitoring: Appears intact Behaviors: Impulsive Safety/Judgment: Impaired Cognition Arousal/Alertness: Awake/alert Behavior During Therapy: Impulsive Overall Cognitive Status: Impaired/Different from baseline Area of Impairment: Orientation;Attention;Memory;Safety/judgement;Problem solving Orientation Level: Place;Time;Situation Current Attention Level: Sustained Memory: Decreased short-term memory Safety/Judgement: Decreased awareness of safety;Decreased awareness of deficits Problem Solving: Slow processing  Blood pressure 142/60, pulse 54, temperature 98.2 F (36.8 C), temperature source Oral, resp. rate 14, height 6\' 1"  (1.854 m), weight 124.3 kg (274 lb 0.5 oz), SpO2 92.00%. Physical Exam  Vitals reviewed. Constitutional: He appears well-developed.  HENT:  Drain remains in right cranium  Eyes: EOM are normal.  Neck: Neck supple. No JVD present. No tracheal deviation present. No thyromegaly present.  Cardiovascular: Normal rate and regular rhythm.   Pulmonary/Chest: Breath sounds normal. No respiratory distress.  Abdominal: Soft. Bowel sounds are normal. He exhibits no distension. There is no tenderness.  Musculoskeletal: He exhibits no edema.  Lymphadenopathy:    He has no cervical adenopathy.  Neurological: He is alert.  Patient was able to provide  his name and date of birth. He stated he was [place]. Patient with very limited awareness of his deficits. He did follow simple commands. Moved all 4's with 4+ to 5/5 strength. No gross sensory deficits. A little impulsive. No gross limb ataxia. Senses pain and LT in all 4.  Skin:  Ventriculostomy drain in place  Psychiatric:  Basic insight and awareness.    No results found for this or any previous visit (from the past 24 hour(s)). No results found.  Assessment/Plan: Diagnosis: right SAH due to ACOM aneurysm 1. Does the need for close, 24 hr/day medical supervision in concert with the patient's rehab needs make it unreasonable for this patient to be served in a less intensive setting? Potentially 2. Co-Morbidities requiring supervision/potential complications: htn 3. Due to bladder management, bowel management, safety, skin/wound care, disease management, medication administration, pain management and patient education, does the patient require 24 hr/day rehab nursing? Potentially 4. Does the patient require coordinated care of a physician, rehab nurse, PT (1-2 hrs/day, 5 days/week), OT (1-2 hrs/day, 5 days/week) and SLP (? hrs/day, ? days/week) to address physical and functional deficits in the context of the above medical diagnosis(es)? Potentially Addressing deficits in the following areas: balance, endurance, locomotion, strength, transferring, bowel/bladder control, bathing, dressing, feeding, grooming, toileting, cognition and psychosocial support 5. Can the patient actively participate in an intensive therapy program of at least 3 hrs of therapy per day at least 5 days per week? Yes 6. The potential for patient to make measurable gains while on inpatient rehab is fair 7. Anticipated functional outcomes upon discharge from inpatient rehab are ?supervision with PT, ?supervision with OT, mod I to supervision with SLP. 8. Estimated rehab length of stay to reach the above functional goals is:  TBD 9. Does the patient have adequate social supports to accommodate these discharge functional goals? Yes 10. Anticipated D/C setting: Home 11. Anticipated post D/C treatments: HH therapy 12. Overall Rehab/Functional Prognosis: excellent  RECOMMENDATIONS: This patient's condition is appropriate for continued rehabilitative care in the following setting: HH vs CIR Patient has agreed to participate in recommended program. Potentially Note that insurance prior authorization may be required for reimbursement for recommended care.  Comment: The patient appears to have made quite a bit of neurological progress. Will follow for further activity with therapy (last session 5/26)---Wants to go home. Family wants him to have "what he needs."  Ranelle Oyster, MD, [facility] Physical Medicine & Rehabilitation     12/08/2012

## 2012-12-08 NOTE — Progress Notes (Signed)
Speech Language Pathology Treatment Patient Details Name: Dennis Wyatt MRN: [id number] DOB: [birth year] Today's Date: 12/08/2012 Time: 4098-1191 SLP Time Calculation (min): 13 min  Assessment / Plan / Recommendation Clinical Impression  Patient continues to present with moderate-severe cognitive deficits.  Max verbal and tactile cueing provided for sustained attention to basic conversation with clinician. Oriented to self only independently. Max cueing for orientation to place, time, and situation, with answers fluctuating each time asked. Very poor carryover of taught information noted.  Max assist for basic problem solving and intellectual awareness. SLP will continue to f/u.     SLP Plan  Continue with current plan of care    Pertinent Vitals/Pain None reported  SLP Goals  SLP Goals Potential to Achieve Goals: Good Progress/Goals/Alternative treatment plan discussed with pt/caregiver and they: Agree SLP Goal #1: Maintain sustained attention to complete 3-4 step basic ADL with min verbal cues to initiate task to completion SLP Goal #1 - Progress: Progressing toward goal SLP Goal #2: Improve intellectual  and emergent awarenes by correctly stating current physical deficits and possible problems that could happen during completion of ADL's in current environment SLP Goal #2 - Progress: Progressing toward goal SLP Goal #3: Improve orientation to time, place, and situation with min assist to refer to memory aids SLP Goal #3 - Progress: Progressing toward goal SLP Goal #4: Complete target problem solving tasks with moderate assist (i.e. use of call light, remote, phone) SLP Goal #4 - Progress: Progressing toward goal SLP Goal #5: Recall 5/5 am events with max verbal cues SLP Goal #5 - Progress: Progressing toward goal         Treatment Treatment focused on: Cognition Skilled Treatment: see clinical impression statement   GO    Ferdinand Lango MA, CCC-SLP [phone]  Ferdinand Lango  Meryl 12/08/2012, 4:10 PM

## 2012-12-08 NOTE — Progress Notes (Signed)
Patient ID: Dennis Wyatt, male   DOB: Apr 02, 1961, 52 y.o.   MRN: [id number] BP 156/51  Pulse 63  Temp(Src) 97.7 F (36.5 C) (Oral)  Resp 19  Ht 6\' 1"  (1.854 m)  Wt 124.3 kg (274 lb 0.5 oz)  BMI 36.16 kg/m2  SpO2 95% Patient alert, oriented only to person. Speech is clear, fluent, nonsensical. Follows commands Perrl, full eom Symmetric facies, tongue and uvula midline Moving all extremities [name] pulled his ventricular catheter out this afternoon. Repeat head CT showed small ventricles, and a great deal of blood. I therefore chose to replace the ventricular catheter. This was done without difficulty.

## 2012-12-08 NOTE — Progress Notes (Addendum)
VASCULAR LAB PRELIMINARY  PRELIMINARY  PRELIMINARY  PRELIMINARY  Transcranial Doppler  Date POD PCO2 HCT BP  MCA ACA PCA OPHT SIPH VERT Basilar  5-23- 14 SB     Right  Left   49  47   -64  -47   31  31   20  19   15  25    -23  --     --    5-26- 14 SB     Right  Left   74  87   -96  -84   57/45  35   32  23   32  42   -21  -33     -60    12-08-12 VS     Right  Left   54 33     -83  -25   34  63   25  26        -27  -60     -60          Right  Left                                             Right  Left                                            Right  Left                                            Right  Left                                        MCA = Middle Cerebral Artery      OPHT = Opthalmic Artery     BASILAR = Basilar Artery   ACA = Anterior Cerebral Artery     SIPH = Carotid Siphon PCA = Posterior Cerebral Artery   VERT = Verterbral Artery                   Normal MCA = 62+\-12 ACA = 50+\-12 PCA = 42+\-23   Technically difficult 12/08/2012 due to movement and respiratory interference from snoring and vocal interference from talking       SLAUGHTER, [place], RVS 12/08/2012, 3:08 PM

## 2012-12-08 NOTE — Op Note (Signed)
12/03/2012  5:47 PM  PATIENT:  Dennis Wyatt  52 y.o. male with communicating hydrocephalus secondary to a SAH from a ruptures Anterior communicating artery aneurysm. He pulled his catheter today inadvertently and I have chosen to replace it due to the large amount of intraventricular blood.   PRE-OPERATIVE DIAGNOSIS: Communicating hydrocephalus  POST-OPERATIVE DIAGNOSIS: Communicating hydrocephalus  PROCEDURE:  Procedure(s):Placement right frontal intraventricular catheter, via existing burr hole.   SURGEON:  Coletta Memos ASSISTANTS:none  ANESTHESIA:   local   DRAINS: Ventriculostomy Drain in the right lateral ventricle   SPECIMEN:  No Specimen  DICTATION: [name] head was prepped and draped in a sterile manner. I removed the sutures from the burr hole site. I placed the ventricular catheter through the burr hole and into the lateral ventricle. I had good spontaneous drainage from the catheter. I tunneled the catheter caudally from the entry site subcutaneously and secured it to the exit site with a suture. I closed the frontal incision with nylon sutures. I applied a sterile dressing. The ventricular catheter was attached to the drainage system.   PLAN OF CARE: inpatient  PATIENT DISPOSITION:  PACU - hemodynamically stable.   Delay start of Pharmacological VTE agent (>24hrs) due to surgical blood loss or risk of bleeding:  yes

## 2012-12-08 NOTE — Progress Notes (Signed)
NUTRITION FOLLOW UP  Intervention:   None at this time.   Nutrition Dx:   Inadequate oral intake related to inability to eat as evidenced by NPO status; resolved.  Goal:   Enteral nutrition to provide 60-70% of estimated calorie needs (22-25 kcals/kg ideal body weight) and 100% of estimated protein needs, based on ASPEN guidelines for permissive underfeeding in critically ill obese individuals; NA  New Goal: Pt to meet >/= 90% of their estimated nutrition needs; met.   Monitor:   PO intake, weight trend  Assessment:   Pt admitted this am with sudden onset of headache. CT shows large SAH with ICH. Pt s/p coiling. Pt extubated 5/24. Pt now on Heart Healthy diet eating 100%. Pt reports good appetite.   Height: Ht Readings from Last 1 Encounters:  12/03/12 6\' 1"  (1.854 m)    Weight Status:   Wt Readings from Last 1 Encounters:  12/07/12 274 lb 0.5 oz (124.3 kg)  Admission weight: 253 lb - 300 lb   Re-estimated needs:  Kcal: 2200-2400 Protein: 100-120 grams Fluid: > 2.2 L/day  Skin: incision  Diet Order: Cardiac Meal Completion: 100%    Intake/Output Summary (Last 24 hours) at 12/08/12 1030 Last data filed at 12/08/12 1000  Gross per 24 hour  Intake   1905 ml  Output   3317 ml  Net  -1412 ml    Last BM: not documented   Labs:   Recent Labs Lab 12/04/12 0400  12/06/12 0610 12/07/12 0540 12/08/12 0448  NA 142  < > 143 142 142  K 4.1  < > 3.5 4.7 4.7  CL 110  < > 112 110 105  CO2 22  < > 21 23 28   BUN 17  < > 25* 18 15  CREATININE 0.66  < > 0.54 0.51 0.73  CALCIUM 8.1*  < > 8.2* 8.4 8.9  MG 2.1  --   --  2.0 2.1  PHOS 2.7  --   --  2.4 2.8  GLUCOSE 153*  < > 119* 105* 101*  < > = values in this interval not displayed.  CBG (last 3)  No results found for this basename: GLUCAP,  in the last 72 hours  Scheduled Meds: . niMODipine  60 mg Oral Q4H  . pantoprazole  40 mg Oral Daily  . senna-docusate  1 tablet Oral BID    Continuous Infusions: .  sodium chloride 75 mL/hr at 12/08/12 1000    Kendell Bane RD, LDN, CNSC [phone] Pager [phone] After Hours Pager

## 2012-12-08 NOTE — Progress Notes (Signed)
5 Days Post-Op  Subjective: Large SAH s/p A com aneurysm coiled 5/23 Pt better daily Mild headache  Objective: Vital signs in last 24 hours: Temp:  [97.7 F (36.5 C)-98.4 F (36.9 C)] 97.7 F (36.5 C) (05/28 0754) Pulse Rate:  [48-64] 56 (05/28 1000) Resp:  [11-22] 15 (05/28 1000) BP: (142-183)/(57-95) 149/75 mmHg (05/28 1000) SpO2:  [92 %-97 %] 96 % (05/28 1000)    PE: afeb; vss A/O Face sym; tongue midline Moves all 4s; Rt leg/foot weaker IVC in place; still bloody but thinner Rt groin soft, no hematoma Pulses 2+ pedal Bilat  Lab Results:   Recent Labs  12/07/12 0540 12/08/12 0448  WBC 11.3* 13.1*  HGB 15.8 16.2  HCT 46.3 47.2  PLT 131* 148*   BMET  Recent Labs  12/07/12 0540 12/08/12 0448  NA 142 142  K 4.7 4.7  CL 110 105  CO2 23 28  GLUCOSE 105* 101*  BUN 18 15  CREATININE 0.51 0.73  CALCIUM 8.4 8.9   PT/INR No results found for this basename: LABPROT, INR,  in the last 72 hours ABG No results found for this basename: PHART, PCO2, PO2, HCO3,  in the last 72 hours  Studies/Results: No results found.  Anti-infectives: Anti-infectives   None      Assessment/Plan: SAH s/p A COM aneurysm coiling 5/23 Better daily   LOS: 5 days    Brayton El 12/08/2012

## 2012-12-08 NOTE — Progress Notes (Signed)
PT Cancellation Note  Patient Details Name: Dennis Wyatt MRN: [id number] DOB: 15-Jul-1960   Cancelled Treatment:    Reason Eval/Treat Not Completed: Medical issues which prohibited therapy, pt's drain not sutured and RN reports that it appears to be slightly unstable, hold PT, with RN in agreement, until Dr. Franky Macho verifies if activity is safe currently.    Nadav Swindell, [place] 12/08/2012, 3:34 PM

## 2012-12-08 NOTE — Progress Notes (Signed)
OT Cancellation Note  Patient Details Name: Dennis Wyatt MRN: [id number] DOB: August 07, 1960   Cancelled Treatment:    Reason Eval/Treat Not Completed: Medical issues which prohibited therapy; pt's drain not sutured and RN reports that it appears to be slightly unstable. Will hold OT session today and re-attempt as appropriate.  12/08/2012 Cipriano Mile OTR/L Pager [phone] Office [phone]

## 2012-12-08 NOTE — Progress Notes (Signed)
PULMONARY  / CRITICAL CARE MEDICINE  Name: Dennis Wyatt MRN: [id number] DOB: 10/10/1960    ADMISSION DATE:  12/03/2012 CONSULTATION DATE:  12/03/2012  REFERRING MD :  Dr. Yetta Barre PRIMARY SERVICE: Neuro surg  CHIEF COMPLAINT:  VDRF  BRIEF PATIENT DESCRIPTION:  52 yo male smoker admitted with headache and mental status change 2nd to [facility] with aneurysm.  Had neuro-IR intervention, and remained on vent post-procedure.  PCCM consulted for vent/medical management.  SIGNIFICANT EVENTS:  5/23 S/P coiling of aneurysm, IVC drain  STUDIES: 5/23 CT head >> Large SAH  LINES / TUBES: ET tube 5/23>>>5/24 IVC drain 5/23 >> Lt radial aline 5/23 >> 5/26  SUBJECTIVE:  C/o HA, interacting but confused Note BP parameters liberalized  VITAL SIGNS: Temp:  [97.7 F (36.5 C)-98.4 F (36.9 C)] 97.7 F (36.5 C) (05/28 0754) Pulse Rate:  [48-64] 56 (05/28 1000) Resp:  [11-22] 15 (05/28 1000) BP: (142-183)/(57-95) 149/75 mmHg (05/28 1000) SpO2:  [92 %-97 %] 96 % (05/28 1000) 2 liter [place]  INTAKE / OUTPUT: Intake/Output     05/27 0701 - 05/28 0700 05/28 0701 - 05/29 0700   P.O.     I.V. (mL/kg) 2025 (16.3) 225 (1.8)   IV Piggyback 105    Total Intake(mL/kg) 2130 (17.1) 225 (1.8)   Urine (mL/kg/hr) 3250 (1.1)    Drains 275 (0.1) 30 (0.1)   Total Output 3525 30   Net -1395 +195        Urine Occurrence  1 x     PHYSICAL EXAMINATION: General: No distress Neuro: Alert, follows commands, oriented to person/place, slight weakness noted Rt lower extremity >> moves all other extremities HEENT: Pupils reactive, IVC in place Cardiovascular: regular, no murmur Lungs: no wheeze Abdomen:  Soft, non tender Musculoskeletal: no edema Skin: no rashes  LABS:  Recent Labs Lab 12/03/12 0710  12/03/12 1619 12/04/12 0400 12/04/12 0500  12/06/12 0610 12/07/12 0540 12/08/12 0448  HGB 19.1*  < >  --  16.8  --   < > 15.5 15.8 16.2  WBC 9.2  --   --  10.6*  --   < > 15.2* 11.3* 13.1*  PLT 164  --   --   137*  --   < > 126* 131* 148*  NA 142  < >  --  142  --   < > 143 142 142  K 3.9  < >  --  4.1  --   < > 3.5 4.7 4.7  CL 106  < >  --  110  --   < > 112 110 105  CO2 23  --   --  22  --   < > 21 23 28   GLUCOSE 157*  < >  --  153*  --   < > 119* 105* 101*  BUN 23  < >  --  17  --   < > 25* 18 15  CREATININE 0.68  < >  --  0.66  --   < > 0.54 0.51 0.73  CALCIUM 9.2  --   --  8.1*  --   < > 8.2* 8.4 8.9  MG  --   --   --  2.1  --   --   --  2.0 2.1  PHOS  --   --   --  2.7  --   --   --  2.4 2.8  AST 21  --   --   --   --   --   --   --   --  ALT 18  --   --   --   --   --   --   --   --   ALKPHOS 91  --   --   --   --   --   --   --   --   BILITOT 0.7  --   --   --   --   --   --   --   --   PROT 6.8  --   --   --   --   --   --   --   --   ALBUMIN 3.9  --   --   --   --   --   --   --   --   APTT 25  --   --   --   --   --   --   --   --   INR 0.97  --   --   --   --   --   --   --   --   PHART  --   --  7.302*  --  7.381  --   --   --   --   PCO2ART  --   --  54.4*  --  42.4  --   --   --   --   PO2ART  --   --  455.0*  --  81.8  --   --   --   --   < > = values in this interval not displayed.  Recent Labs Lab 12/03/12 0901 12/04/12 0753  GLUCAP 167* 134*    Imaging: No results found.    ASSESSMENT / PLAN:   NEUROLOGIC A: SAH 2nd to [facility] aneurysm s/p coiling 5/23 by IR, and IVC drain. P:   -continue keppra per neurosurgery -IVC drain per neurosurgery, not in a position to remove at this time -continue nimotop -continue HHH therapy >> BP goal liberalized to 180 to prevent vasospasm 5/28 -prn tylenol, oxycodone, morphine for headache > increase 5/28  CARDIOVASCULAR A: Hx of HTN. P:  -continue hydralazine -prn labetalol  GASTROINTESTINAL A: Nutrition. P:   -heart healthy diet  HEMATOLOGIC A: Thrombocytopenia. P:  -f/u CBC  ENDOCRINE A: Hyperglycemia >> no hx of DM. P:   -check HbA1C -monitor blood sugar on BMET  Resolved problems >> Post-procedure  VDRF, 2nd polycythemia.  Levy Pupa, MD, PhD 12/08/2012, 10:37 AM [place] [facility] [facility] [phone] or if no answer [phone]

## 2012-12-08 NOTE — Progress Notes (Signed)
UR completed 

## 2012-12-09 MED ORDER — ACETAMINOPHEN 325 MG PO TABS
650.0000 mg | ORAL_TABLET | ORAL | Status: DC | PRN
  Administered 2012-12-09: 650 mg via ORAL
  Filled 2012-12-09 (×2): qty 2

## 2012-12-09 NOTE — Progress Notes (Signed)
PULMONARY  / CRITICAL CARE MEDICINE  Name: Dennis Wyatt MRN: [id number] DOB: 09/12/1960    ADMISSION DATE:  12/03/2012 CONSULTATION DATE:  12/03/2012  REFERRING MD :  Dr. Yetta Barre PRIMARY SERVICE: Neuro surg  CHIEF COMPLAINT:  VDRF  BRIEF PATIENT DESCRIPTION:  52 yo male smoker admitted with headache and mental status change 2nd to [facility] with aneurysm.  Had neuro-IR intervention, and remained on vent post-procedure.  PCCM consulted for vent/medical management.  SIGNIFICANT EVENTS:  5/23 S/P coiling of aneurysm, IVC drain  STUDIES: 5/23 CT head >> Large [facility] 5/28 CT Head >> stable diffuse SAH, decreased IVH, ventric out, improved mass effect  LINES / TUBES: ET tube 5/23>>>5/24 IVC drain 5/23 >> 5/28 IVC drain 5/28 >>  Lt radial aline 5/23 >> 5/26  SUBJECTIVE:  Pulled his ventric yesterday pm > replaced by Dr Franky Macho C/o HA, interacting but confused  VITAL SIGNS: Temp:  [97.7 F (36.5 C)-98 F (36.7 C)] 97.7 F (36.5 C) (05/29 0807) Pulse Rate:  [51-63] 52 (05/29 0700) Resp:  [9-19] 12 (05/29 0700) BP: (143-170)/(46-91) 145/73 mmHg (05/29 0700) SpO2:  [90 %-98 %] 96 % (05/29 0700) 2 liter   INTAKE / OUTPUT: Intake/Output     05/28 0701 - 05/29 0700 05/29 0701 - 05/30 0700   P.O. 270    I.V. (mL/kg) 1800 (14.5) 75 (0.6)   IV Piggyback 100    Total Intake(mL/kg) 2170 (17.5) 75 (0.6)   Urine (mL/kg/hr) 4400 (1.5)    Drains 252 (0.1) 14 (0.1)   Total Output 4652 14   Net -2482 +61        Urine Occurrence 4 x      PHYSICAL EXAMINATION: General: No distress Neuro: Alert, follows commands, oriented to person/place, slight weakness noted Rt lower extremity >> moves all other extremities HEENT: Pupils reactive, IVC in place Cardiovascular: regular, no murmur Lungs: no wheeze Abdomen:  Soft, non tender Musculoskeletal: no edema Skin: no rashes  LABS:  Recent Labs Lab 12/03/12 0710  12/03/12 1619 12/04/12 0400 12/04/12 0500  12/06/12 0610 12/07/12 0540  12/08/12 0448  HGB 19.1*  < >  --  16.8  --   < > 15.5 15.8 16.2  WBC 9.2  --   --  10.6*  --   < > 15.2* 11.3* 13.1*  PLT 164  --   --  137*  --   < > 126* 131* 148*  NA 142  < >  --  142  --   < > 143 142 142  K 3.9  < >  --  4.1  --   < > 3.5 4.7 4.7  CL 106  < >  --  110  --   < > 112 110 105  CO2 23  --   --  22  --   < > 21 23 28   GLUCOSE 157*  < >  --  153*  --   < > 119* 105* 101*  BUN 23  < >  --  17  --   < > 25* 18 15  CREATININE 0.68  < >  --  0.66  --   < > 0.54 0.51 0.73  CALCIUM 9.2  --   --  8.1*  --   < > 8.2* 8.4 8.9  MG  --   --   --  2.1  --   --   --  2.0 2.1  PHOS  --   --   --  2.7  --   --   --  2.4 2.8  AST 21  --   --   --   --   --   --   --   --   ALT 18  --   --   --   --   --   --   --   --   ALKPHOS 91  --   --   --   --   --   --   --   --   BILITOT 0.7  --   --   --   --   --   --   --   --   PROT 6.8  --   --   --   --   --   --   --   --   ALBUMIN 3.9  --   --   --   --   --   --   --   --   APTT 25  --   --   --   --   --   --   --   --   INR 0.97  --   --   --   --   --   --   --   --   PHART  --   --  7.302*  --  7.381  --   --   --   --   PCO2ART  --   --  54.4*  --  42.4  --   --   --   --   PO2ART  --   --  455.0*  --  81.8  --   --   --   --   < > = values in this interval not displayed.  Recent Labs Lab 12/03/12 0901 12/04/12 0753  GLUCAP 167* 134*    Imaging: Ct Head Wo Contrast  12/08/2012   *RADIOLOGY REPORT*  Clinical Data: Intracranial hemorrhage.  Status post coil embolization of an anterior communicating artery aneurysm.  The patient pulled as ventriculostomy drain out today.  CT HEAD WITHOUT CONTRAST  Technique:  Contiguous axial images were obtained from the base of the skull through the vertex without contrast.  Comparison: CT head without contrast 12/03/2012.  Findings: The right frontal ventriculostomy catheter is no longer present.  Extensive intraventricular hemorrhage persists.  The ventricles have decreased in size since  the prior exam. Ventricular hemorrhage has decreased as well.  Diffuse subarachnoid hemorrhage is again noted without definite areas of new hemorrhage. Right frontal parenchymal hemorrhage is again noted.  There is been some retraction of the clot with expected surrounding edema.  The basil cisterns are better visualized on today's exam, suggesting slight decrease in intraventricular mass effect.  The coil mass is not significantly changed.  The paranasal sinuses and mastoid air cells are clear.  The right frontal burr hole is present.  The osseous skull is otherwise intact.  IMPRESSION:  1.  Interval decrease in size of intraventricular hemorrhage and ventricles. 2.  Interval removal of the right frontal ventriculostomy drain. 3.  Persistent diffuse subarachnoid hemorrhage without definite areas of new hemorrhage. 4.  Improved visualization of the basal cisterns suggests decreased intracranial mass effect.   Original Report Authenticated By: Marin Roberts, M.D.     ASSESSMENT / PLAN:   NEUROLOGIC A: SAH 2nd to [facility] aneurysm s/p coiling 5/23 by IR, and IVC drain (replaced 5/28 after pt pulled) P:   -  continue keppra per neurosurgery -IVC drain per neurosurgery, not in a position to remove at this time - mass effect better but still w significant residual hemorrhage -continue nimotop -continue HHH therapy >> BP goal liberalized to 180 to prevent vasospasm 5/28 -prn tylenol, oxycodone, morphine for headache > increase 5/28 -prophylactic cefazolin post-ventric replacement 5/28  CARDIOVASCULAR A: Hx of HTN. P:  -continue hydralazine -prn labetalol  GASTROINTESTINAL A: Nutrition. P:   -heart healthy diet  HEMATOLOGIC A: Thrombocytopenia. P:  -f/u CBC  ENDOCRINE A: Hyperglycemia >> no hx of DM. P:   -check HbA1C -monitor blood sugar on BMET  Resolved problems >> Post-procedure VDRF, 2nd polycythemia.  Levy Pupa, MD, PhD 12/09/2012, 8:29 AM [place] [facility] [phone] or if no answer [phone]

## 2012-12-09 NOTE — Progress Notes (Signed)
PT Cancellation Note  Patient Details Name: Kristan Votta MRN: [id number] DOB: 27-Jun-1961   Cancelled Treatment:    Reason Eval/Treat Not Completed: Other (comment); patient remains on bedrest following replacement of IVC.  Will attempt when cleared for edge of bed vs out of bed activity.   Sakiya Stepka,CYNDI 12/09/2012, 10:16 AM

## 2012-12-09 NOTE — Progress Notes (Signed)
Rehab admissions - Evaluated for possible admission.  I will need updated PT and OT progress before I can approach insurance carrier for possible inpatient rehab admission.  Will await PT and OT updates.  Call me for questions.  #[phone]

## 2012-12-09 NOTE — Progress Notes (Signed)
OT Cancellation Note  Patient Details Name: Dennis Wyatt MRN: [id number] DOB: 03-02-1961   Cancelled Treatment:    Reason Eval/Treat Not Completed: Other (comment) patient remains on bedrest following replacement of IVC. Will attempt when cleared for edge of bed vs out of bed activity.  12/09/2012 Cipriano Mile OTR/L Pager [phone] Office [phone]

## 2012-12-09 NOTE — Progress Notes (Signed)
Speech Language Pathology Treatment Patient Details Name: Dennis Wyatt MRN: [id number] DOB: 31-Dec-1960 Today's Date: 12/09/2012 Time: 1555-1610 SLP Time Calculation (min): 15 min  Assessment / Plan / Recommendation Clinical Impression  Patient continues to present with moderate-severe cognitive deficits. Max verbal and tactile cueing provided for sustained attention to basic conversation with clinician. Pt was verbose and tangential throughout the session, however, demonstrated increased topic maintenance. Pt independently oriented to self and required Max question, visual and contextual cueing for orientation to place, time, and situation, with answers fluctuating each time asked with poor carryover. Pt also required total A for intellectual awareness of situation and deficits. SLP will continue to f/u.     SLP Plan  Continue with current plan of care    Pertinent Vitals/Pain No/Denies Pain  SLP Goals  SLP Goals Potential to Achieve Goals: Good Potential Considerations: Previous level of function;Cooperation/participation level;Family/community support Progress/Goals/Alternative treatment plan discussed with pt/caregiver and they: Agree SLP Goal #1 - Progress: Progressing toward goal SLP Goal #2 - Progress: Progressing toward goal SLP Goal #3 - Progress: Progressing toward goal SLP Goal #4 - Progress: Progressing toward goal SLP Goal #5 - Progress: Progressing toward goal  General Temperature Spikes Noted: No Respiratory Status: Room air Behavior/Cognition: Alert;Cooperative;Pleasant mood Oral Cavity - Dentition: Adequate natural dentition Patient Positioning: Partially reclined  GO     Mike Hamre 12/09/2012, 4:21 PM

## 2012-12-09 NOTE — Progress Notes (Signed)
Patient ID: Dennis Wyatt, male   DOB: [birth year], 52 y.o.   MRN: [id number] BP 170/86  Pulse 57  Temp(Src) 97.5 F (36.4 C) (Oral)  Resp 15  Ht 6\' 1"  (1.854 m)  Wt 124.3 kg (274 lb 0.5 oz)  BMI 36.16 kg/m2  SpO2 97% Alert and oriented to person only. Believes he was in [facility]. Follows commands. Moving all extremities well Perrl, full eom Ventricular catheter is draining well Stable exam.  TCD's show mild velocity increase. No clinical evidence of vasospasm.

## 2012-12-10 NOTE — Progress Notes (Signed)
Patient ID: Dennis Wyatt, male   DOB: Nov 05, 1960, 52 y.o.   MRN: [id number] BP 165/72  Pulse 58  Temp(Src) 97.8 F (36.6 C) (Oral)  Resp 15  Ht 6\' 1"  (1.854 m)  Wt 124.3 kg (274 lb 0.5 oz)  BMI 36.16 kg/m2  SpO2 96% Alert and oriented x 1, confused Following commands, moving all extremities well Perrl, full eom Tongue and uvula midline  Intake/Output Summary (Last 24 hours) at 12/10/12 1450 Last data filed at 12/10/12 1400  Gross per 24 hour  Intake   2765 ml  Output   3079 ml  Net   -314 ml   Stable exam. Continue with ventricular drainage, remains bloody.

## 2012-12-10 NOTE — Progress Notes (Signed)
The patient agitated, not following commends, pulling on lines   Restrains ordered

## 2012-12-10 NOTE — Progress Notes (Signed)
OT Cancellation Note  Patient Details Name: Dennis Wyatt MRN: [id number] DOB: Nov 12, 1960   Cancelled Treatment:    Per RN and activity orders, pt continues to be on bed rest.  Will continue to check when pt is appropriate to see.  12/10/2012 Cipriano Mile OTR/L Pager [phone] Office [phone]

## 2012-12-10 NOTE — Progress Notes (Signed)
PULMONARY  / CRITICAL CARE MEDICINE  Name: Dennis Wyatt MRN: [id number] DOB: December 31, 1960    ADMISSION DATE:  12/03/2012 CONSULTATION DATE:  12/03/2012  REFERRING MD :  Dr. Yetta Barre PRIMARY SERVICE: Neuro surg  CHIEF COMPLAINT:  VDRF  BRIEF PATIENT DESCRIPTION:  52 yo male smoker admitted with headache and mental status change 2nd to [facility] with aneurysm.  Had neuro-IR intervention, and remained on vent post-procedure.  PCCM consulted for vent/medical management.  SIGNIFICANT EVENTS:  5/23 S/P coiling of aneurysm, IVC drain  STUDIES: 5/23 CT head >> Large [facility] 5/28 CT Head >> stable diffuse SAH, decreased IVH, ventric out, improved mass effect  LINES / TUBES: ET tube 5/23>>>5/24 IVC drain 5/23 >> 5/28 IVC drain 5/28 >>  Lt radial aline 5/23 >> 5/26  SUBJECTIVE:  Remains impulsive at times HA is better  VITAL SIGNS: Temp:  [97.5 F (36.4 C)-98.8 F (37.1 C)] 98.8 F (37.1 C) (05/30 0800) Pulse Rate:  [49-59] 49 (05/30 0900) Resp:  [10-19] 10 (05/30 0900) BP: (149-186)/(60-90) 154/73 mmHg (05/30 0900) SpO2:  [93 %-98 %] 96 % (05/30 0900)  INTAKE / OUTPUT: Intake/Output     05/29 0701 - 05/30 0700 05/30 0701 - 05/31 0700   P.O. 480    I.V. (mL/kg) 2200 (17.7) 200 (1.6)   IV Piggyback 150    Total Intake(mL/kg) 2830 (22.8) 200 (1.6)   Urine (mL/kg/hr) 3685 (1.2)    Drains 295 (0.1) 23 (0.1)   Total Output 3980 23   Net -1150 +177        Urine Occurrence 2 x 1 x   Stool Occurrence 1 x      PHYSICAL EXAMINATION: General: No distress, sleeping Neuro: sleeping and wakes easily, follows commands, oriented to person/place, slight weakness noted Rt lower extremity >> moves all other extremities HEENT: Pupils reactive, IVC in place Cardiovascular: regular, no murmur Lungs: no wheeze Abdomen:  Soft, non tender Musculoskeletal: no edema Skin: no rashes  LABS:  Recent Labs Lab 12/03/12 1619 12/04/12 0400 12/04/12 0500  12/06/12 0610 12/07/12 0540 12/08/12 0448   HGB  --  16.8  --   < > 15.5 15.8 16.2  WBC  --  10.6*  --   < > 15.2* 11.3* 13.1*  PLT  --  137*  --   < > 126* 131* 148*  NA  --  142  --   < > 143 142 142  K  --  4.1  --   < > 3.5 4.7 4.7  CL  --  110  --   < > 112 110 105  CO2  --  22  --   < > 21 23 28   GLUCOSE  --  153*  --   < > 119* 105* 101*  BUN  --  17  --   < > 25* 18 15  CREATININE  --  0.66  --   < > 0.54 0.51 0.73  CALCIUM  --  8.1*  --   < > 8.2* 8.4 8.9  MG  --  2.1  --   --   --  2.0 2.1  PHOS  --  2.7  --   --   --  2.4 2.8  PHART 7.302*  --  7.381  --   --   --   --   PCO2ART 54.4*  --  42.4  --   --   --   --   PO2ART 455.0*  --  81.8  --   --   --   --   < > =  values in this interval not displayed.  Recent Labs Lab 12/04/12 0753  GLUCAP 134*    Imaging: Ct Head Wo Contrast  12/08/2012   *RADIOLOGY REPORT*  Clinical Data: Intracranial hemorrhage.  Status post coil embolization of an anterior communicating artery aneurysm.  The patient pulled as ventriculostomy drain out today.  CT HEAD WITHOUT CONTRAST  Technique:  Contiguous axial images were obtained from the base of the skull through the vertex without contrast.  Comparison: CT head without contrast 12/03/2012.  Findings: The right frontal ventriculostomy catheter is no longer present.  Extensive intraventricular hemorrhage persists.  The ventricles have decreased in size since the prior exam. Ventricular hemorrhage has decreased as well.  Diffuse subarachnoid hemorrhage is again noted without definite areas of new hemorrhage. Right frontal parenchymal hemorrhage is again noted.  There is been some retraction of the clot with expected surrounding edema.  The basil cisterns are better visualized on today's exam, suggesting slight decrease in intraventricular mass effect.  The coil mass is not significantly changed.  The paranasal sinuses and mastoid air cells are clear.  The right frontal burr hole is present.  The osseous skull is otherwise intact.  IMPRESSION:   1.  Interval decrease in size of intraventricular hemorrhage and ventricles. 2.  Interval removal of the right frontal ventriculostomy drain. 3.  Persistent diffuse subarachnoid hemorrhage without definite areas of new hemorrhage. 4.  Improved visualization of the basal cisterns suggests decreased intracranial mass effect.   Original Report Authenticated By: Marin Roberts, M.D.     ASSESSMENT / PLAN:   NEUROLOGIC A: SAH 2nd to [facility] aneurysm s/p coiling 5/23 by IR, and IVC drain (replaced 5/28 after pt pulled) TCD 5/28 mildly elevated velocities R anterior cerebral, L vertebral, basilar arteries.  P:   -continue keppra  -IVC drain per neurosurgery, not in a position to remove at this time - mass effect better but still w significant residual hemorrhage on CT 5/28 -continue nimotop -continue HHH therapy >> BP goal liberalized to 180 to prevent vasospasm 5/28; TCD's 5/28 with some suggestion vasospasm but no clinical evidence to support this; will continue to watch closely -recheck TCD's based on NSGY plans -prn tylenol, oxycodone, morphine for headache > increased 5/28 -prophylactic cefazolin post-ventric replacement 5/28  CARDIOVASCULAR A: Hx of HTN. P:  -continue hydralazine -prn labetalol  GASTROINTESTINAL A: Nutrition. P:   -heart healthy diet  HEMATOLOGIC A: Thrombocytopenia. P:  -f/u CBC  ENDOCRINE A: Hyperglycemia >> no hx of DM, HgA1c > 5.4 P:   -monitor blood sugar on BMET  Resolved problems >> Post-procedure VDRF, 2nd polycythemia.  PCCM will be available over the weekend, please call if we can help you. We will see him on Monday  Levy Pupa, MD, PhD 12/10/2012, 9:20 AM [place] [facility] [phone] or if no answer [phone]

## 2012-12-10 NOTE — Progress Notes (Addendum)
VASCULAR LAB PRELIMINARY  PRELIMINARY  PRELIMINARY  PRELIMINARY  Transcranial Doppler  Date POD PCO2 HCT BP  MCA ACA PCA OPHT SIPH VERT Basilar  5-23- 14 SB     Right  Left   49  47   -64  -47   31  31   20  19   15  25    -23  --     --    5-26- 14 SB     Right  Left   74  87   -96  -84   57/45  35   32  23   32  42   -21  -33     -60    12-08-12 VS     Right  Left   54 33     -83  -25   34  63   25  26        -27  -60     -60    12/10/12 MS      Right  Left   35  63   -34  -18   25  39   18  25   61  *   -26  -52     -41    12-13-12 SB      Right  Left   59  46   -46  -37   35  27   18  22    36  45   --  -18     -52    12-15-12 SB     Right  Left   57  62   -49  -54   25  27   20  30    40  29   -20  -24     -42         Right  Left                                        MCA = Middle Cerebral Artery      OPHT = Opthalmic Artery     BASILAR = Basilar Artery   ACA = Anterior Cerebral Artery     SIPH = Carotid Siphon PCA = Posterior Cerebral Artery   VERT = Verterbral Artery                   Normal MCA = 62+\-12 ACA = 50+\-12 PCA = 42+\-23   *-Unable to insonate Technically difficult 12/08/2012 due to movement and respiratory interference from snoring and vocal interference from talking    12/10/2012 10:39 AM Gertie Fey, RDMS, RDCS

## 2012-12-10 NOTE — Progress Notes (Signed)
PT Cancellation Note  Patient Details Name: Dennis Wyatt MRN: [id number] DOB: [birth year]   Cancelled Treatment:    Reason Eval/Treat Not Completed: Medical issues which prohibited therapy.  Patient remains on bedrest.  Will continue to monitor for appropriateness for PT.   Vena Austria 12/10/2012, 12:02 PM Durenda Hurt Renaldo Fiddler, [facility] Pager [phone]

## 2012-12-11 ENCOUNTER — Inpatient Hospital Stay (HOSPITAL_COMMUNITY): Payer: No Typology Code available for payment source

## 2012-12-11 LAB — BASIC METABOLIC PANEL
BUN: 16 mg/dL (ref 6–23)
CO2: 23 meq/L (ref 19–32)
Calcium: 8.9 mg/dL (ref 8.4–10.5)
Chloride: 89 meq/L — ABNORMAL LOW (ref 96–112)
Creatinine, Ser: 0.4 mg/dL — ABNORMAL LOW (ref 0.50–1.35)
GFR calc Af Amer: >90 mL/min (ref >90)
GFR calc non Af Amer: >90 mL/min (ref >90)
Glucose, Bld: 117 mg/dL — ABNORMAL HIGH (ref 70–99)
Potassium: 4 meq/L (ref 3.5–5.1)
Sodium: 124 meq/L — ABNORMAL LOW (ref 135–145)

## 2012-12-11 LAB — CBC
HCT: 44.7 % (ref 39.0–52.0)
Hemoglobin: 16.8 g/dL (ref 13.0–17.0)
MCH: 32.7 pg (ref 26.0–34.0)
MCHC: 37.6 g/dL — ABNORMAL HIGH (ref 30.0–36.0)
MCV: 87.1 fL (ref 78.0–100.0)
Platelets: 199 10*3/uL (ref 150–400)
RBC: 5.13 MIL/uL (ref 4.22–5.81)
RDW: 11.9 % (ref 11.5–15.5)
WBC: 15.9 10*3/uL — ABNORMAL HIGH (ref 4.0–10.5)

## 2012-12-11 MED ORDER — SODIUM CHLORIDE 3 % IV SOLN
INTRAVENOUS | Status: DC
  Administered 2012-12-11: 09:00:00 via INTRAVENOUS
  Filled 2012-12-11 (×2): qty 500

## 2012-12-11 NOTE — Progress Notes (Signed)
Subjective: Patient resting in bed, without complaints.  Called by nursing staff early this morning because IVC stopped draining, and there was leakage around the catheter. There's been no further IVC drainage. CT scan of brain this morning shows small ventricles (reading by radiologist pending).  Objective: Vital signs in last 24 hours: Filed Vitals:   12/11/12 0400 12/11/12 0500 12/11/12 0600 12/11/12 0700  BP: 175/71 157/69 176/76 173/63  Pulse: 52 50 53 54  Temp: 97.8 F (36.6 C)     TempSrc: Oral     Resp: 12 11 15 15   Height:      Weight:      SpO2: 96% 96% 96% 96%    Intake/Output from previous day: 05/30 0701 - 05/31 0700 In: 2990 [P.O.:680; I.V.:2260; IV Piggyback:50] Out: 2524 [Urine:2325; Drains:199] Intake/Output this shift:    Physical Exam:  Awake, oriented only to name. Following commands with all 4 extremities. EOMI.  CBC  Recent Labs  12/11/12 0545  WBC 15.9*  HGB 16.8  HCT 44.7  PLT 199   BMET  Recent Labs  12/11/12 0545  NA 124*  K 4.0  CL 89*  CO2 23  GLUCOSE 117*  BUN 16  CREATININE 0.40*  CALCIUM 8.9    Assessment/Plan: IVC no longer working, CT scan shows no hydrocephalus. Therefore IVC DC'd, exit site stapled closed after Betadine prep. Exit site dressed with sterile dressing.  Hyponatremia is new, and may account for some of his altered mental status. We'll decrease normal saline from 100 cc per hour to 50 cc per hour, and it 3% saline at 10 cc per hour, and recheck BMET in a.m.   Hewitt Shorts, MD 12/11/2012, 8:19 AM

## 2012-12-11 NOTE — Progress Notes (Signed)
Dr Jule Ser made aware that EVD is not draining. Dsg/HOB however is moist. Oder for AM CT obtained.

## 2012-12-11 NOTE — Progress Notes (Addendum)
Physical Therapy Treatment Patient Details Name: Dennis Wyatt MRN: [id number] DOB: 18-Sep-1960 Today's Date: 12/11/2012 Time: 6962-9528 PT Time Calculation (min): 23 min  PT Assessment / Plan / Recommendation Comments on Treatment Session  Patient finally off bedrest with IVC d/c today.  Now presenting with impulsivity, ataxic gait, general weakness from bedrest.  Still feel appropriate for CIR level therapies when stable to d/c.  Goals due this date, updated duration and goals remain appropriate.  Will continue to follow acutely.    Follow Up Recommendations  CIR     Does the patient have the potential to tolerate intense rehabilitation   Yes     Equipment Recommendations  Rolling walker with 5" wheels    Recommendations for Other Services  Occupational Therapy  Frequency Min 4X/week   Plan Discharge plan remains appropriate    Precautions / Restrictions Precautions Precautions: Fall Precaution Comments: Impulsive, wrist restraints   Pertinent Vitals/Pain Min c/o headache with mobility    Mobility  Bed Mobility Bed Mobility: Rolling Right;Right Sidelying to Sit Rolling Right: 4: Min assist Right Sidelying to Sit: 3: Mod assist;With rails Sitting - Scoot to Edge of Bed: 5: Supervision Sit to Supine: 5: Supervision Details for Bed Mobility Assistance: cues for technique and for initiation at first, then needed cues and assist for safety due to impulsivity Transfers Sit to Stand: 1: +2 Total assist;From bed Sit to Stand: Patient Percentage: 70% Stand to Sit: 1: +2 Total assist;To bed Stand to Sit: Patient Percentage: 80% Details for Transfer Assistance: assist for lifting and for safety due to pt impulsive and not up out of bed in 5 days Ambulation/Gait Ambulation/Gait Assistance: 1: +2 Total assist Ambulation/Gait: Patient Percentage: 70% Ambulation Distance (Feet): 80 Feet Assistive device: Rolling walker Ambulation/Gait Assistance Details: pt needed cues and assist  to keep walker close, trying to push over walker my foot when held in place for safety.  Needs redirection due to trying to enter med room to use bathroom and stopping at nurses station to gain balance and reaching over counter for items. Gait Pattern: Ataxic;Trunk flexed;Narrow base of support;Step-through pattern;Shuffle      PT Goals Acute Rehab PT Goals Time For Goal Achievement: 12/25/12 Potential to Achieve Goals: Good Pt will go Supine/Side to Sit: with supervision PT Goal: Supine/Side to Sit - Progress: Progressing toward goal Pt will go Sit to Supine/Side: with supervision PT Goal: Sit to Supine/Side - Progress: Progressing toward goal Pt will go Sit to Stand: with min assist PT Goal: Sit to Stand - Progress: Progressing toward goal Pt will go Stand to Sit: with min assist PT Goal: Stand to Sit - Progress: Progressing toward goal Pt will Ambulate: 51 - 150 feet;with min assist;with least restrictive assistive device PT Goal: Ambulate - Progress: Progressing toward goal  Visit Information  Last PT Received On: 12/11/12 Assistance Needed: +2    Subjective Data  Subjective: Yea, lets go out and get [place] some.  Looking for a [employer].   Cognition  Cognition Arousal/Alertness: Awake/alert Behavior During Therapy: Impulsive Overall Cognitive Status: Impaired/Different from baseline Area of Impairment: Orientation;Attention;Memory;Safety/judgement;Problem solving Orientation Level: Place;Time;Situation Current Attention Level: Focused Memory: Decreased short-term memory Safety/Judgement: Decreased awareness of safety;Decreased awareness of deficits Problem Solving: Requires verbal cues;Requires tactile cues    Balance  Static Standing Balance Static Standing - Balance Support: No upper extremity supported Static Standing - Level of Assistance: 4: Min assist Static Standing - Comment/# of Minutes: after ambulation with walker too far forward and anterior bias with  impulsivity,  took hands off walker in room to adjust IV and ECG lines and pt able to stand with minguard assist.  End of Session PT - End of Session Equipment Utilized During Treatment: Gait belt Activity Tolerance: Patient limited by fatigue Patient left: in bed;with restraints reapplied;with bed alarm set   GP     [facility] 12/11/2012, 4:45 PM [place], [place] [phone] 12/11/2012

## 2012-12-11 NOTE — Progress Notes (Signed)
PULMONARY  / CRITICAL CARE MEDICINE  Name: Dennis Wyatt MRN: [id number] DOB: [birth year]    ADMISSION DATE:  12/03/2012 CONSULTATION DATE:  12/03/2012  REFERRING MD :  Dr. Yetta Barre PRIMARY SERVICE: Neuro surg  CHIEF COMPLAINT:  VDRF  BRIEF PATIENT DESCRIPTION:  52 yo male smoker admitted with headache and mental status change 2nd to [facility] with aneurysm.  Had neuro-IR intervention, and remained on vent post-procedure.  PCCM consulted for vent/medical management.  SIGNIFICANT EVENTS:  5/23 S/P coiling of aneurysm, IVC drain  STUDIES: 5/23 CT head >> Large [facility] 5/28 CT Head >> stable diffuse SAH, decreased IVH, ventric out, improved mass effect  LINES / TUBES: ET tube 5/23>>>5/24 IVC drain 5/23 >> 5/28 IVC drain 5/28 >>  Lt radial aline 5/23 >> 5/26  SUBJECTIVE:  No new complaints.  VITAL SIGNS: Temp:  [97.3 F (36.3 C)-99.2 F (37.3 C)] 98.1 F (36.7 C) (05/31 1139) Pulse Rate:  [49-58] 49 (05/31 1139) Resp:  [11-19] 14 (05/31 1200) BP: (103-177)/(63-92) 167/84 mmHg (05/31 1200) SpO2:  [94 %-100 %] 99 % (05/31 1200)  INTAKE / OUTPUT: Intake/Output     05/30 0701 - 05/31 0700 05/31 0701 - 06/01 0700   P.O. 680 450   I.V. (mL/kg) 2260 (18.2) 350.8 (2.8)   IV Piggyback 50    Total Intake(mL/kg) 2990 (24.1) 800.8 (6.4)   Urine (mL/kg/hr) 2325 (0.8) 375 (0.5)   Drains 199 (0.1)    Total Output 2524 375   Net +466 +425.8        Urine Occurrence 5 x 2 x   Stool Occurrence 3 x 1 x     PHYSICAL EXAMINATION: General: No distress, awake, eating and interactive. Neuro: follows commands, oriented to person/place, slight weakness noted Rt lower extremity >> moves all other extremities HEENT: Pupils reactive, IVC in place Cardiovascular: regular, no murmur Lungs: no wheeze Abdomen:  Soft, non tender Musculoskeletal: no edema Skin: no rashes  LABS:  Recent Labs Lab 12/07/12 0540 12/08/12 0448 12/11/12 0545  HGB 15.8 16.2 16.8  WBC 11.3* 13.1* 15.9*  PLT 131* 148* 199   NA 142 142 124*  K 4.7 4.7 4.0  CL 110 105 89*  CO2 23 28 23   GLUCOSE 105* 101* 117*  BUN 18 15 16   CREATININE 0.51 0.73 0.40*  CALCIUM 8.4 8.9 8.9  MG 2.0 2.1  --   PHOS 2.4 2.8  --    No results found for this basename: GLUCAP,  in the last 168 hours  Imaging: Ct Head Wo Contrast  12/11/2012   *RADIOLOGY REPORT*  Clinical Data: The patient with 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  Status post ventricular drain placement and aneurysm clipping/coiling.  CT HEAD WITHOUT CONTRAST  Technique:  Contiguous axial images were obtained from the base of the skull through the vertex without contrast.  Comparison: Head CT, 12/08/2012  Findings: Since the prior exam, a right frontal ventriculostomy catheter has been placed.  The tip lies along the inferior aspect of the lateral ventricles adjacent to the third ventricle and foramen of Monro.  The ventricles are normal in size and overall configuration.  Interventricular hemorrhage persists in the dependent lateral ventricles, but is decreased from the prior exam.  Hemorrhage along the inferior right frontal lobe with associated mass effect and surrounding vasogenic edema is stable. Subarachnoid hemorrhage extending within sulci bilaterally is slightly less apparent, particularly along the left sylvian fissure.  There is no new intracranial hemorrhage.  The coil mass from the exclusion of the anterior communicating artery aneurysm is stable.  Visualization of the basilar cisterns is similar to the prior study.  IMPRESSION: Status post placement of a right frontal ventriculostomy catheter, as detailed above.  Decreased interventricular hemorrhage since the prior study. Slight decrease in subarachnoid hemorrhage.  Persistent right frontal parenchymal hemorrhage.  Overall mass effect appears stable.  No new intracranial hemorrhage.   Original Report Authenticated By: Amie Portland,  M.D.     ASSESSMENT / PLAN:   NEUROLOGIC A: SAH 2nd to [facility] aneurysm s/p coiling 5/23 by IR, and IVC drain (replaced 5/28 after pt pulled) TCD 5/28 mildly elevated velocities R anterior cerebral, L vertebral, basilar arteries. .  WBC rising but afebrile. P:   - Continue keppra  -IVC drain per neurosurgery, not in a position to remove at this time - mass effect better but still w significant residual hemorrhage on CT 5/28 -continue nimotop -continue HHH therapy >> BP goal liberalized to 180 to prevent vasospasm 5/28; TCD's 5/28 with some suggestion vasospasm but no clinical evidence to support this; will continue to watch closely -recheck TCD's based on NSGY plans -prn tylenol, oxycodone, morphine for headache > increased 5/28 -prophylactic cefazolin post-ventric replacement 5/28  CARDIOVASCULAR A: Hx of HTN. P:  -continue hydralazine -prn labetalol  GASTROINTESTINAL A: Nutrition. P:   -heart healthy diet  HEMATOLOGIC A: Thrombocytopenia. P:  -f/u CBC  ENDOCRINE A: Hyperglycemia >> no hx of DM, HgA1c > 5.4 P:   -monitor blood sugar on BMET  Resolved problems >> Post-procedure VDRF, 2nd polycythemia.  PCCM will see again on Monday.  Alyson Reedy, M.D. [facility] Pulmonary/Critical Care Medicine. Pager: [phone]. After hours pager: [phone].

## 2012-12-12 LAB — BASIC METABOLIC PANEL
BUN: 14 mg/dL (ref 6–23)
CO2: 21 mEq/L (ref 19–32)
Calcium: 8.8 mg/dL (ref 8.4–10.5)
Chloride: 81 mEq/L — ABNORMAL LOW (ref 96–112)
Creatinine, Ser: 0.3 mg/dL — ABNORMAL LOW (ref 0.50–1.35)
GFR calc Af Amer: >90 mL/min (ref >90)
GFR calc non Af Amer: >90 mL/min (ref >90)
Glucose, Bld: 111 mg/dL — ABNORMAL HIGH (ref 70–99)
Potassium: 5.1 mEq/L (ref 3.5–5.1)
Sodium: 115 mEq/L — CL (ref 135–145)

## 2012-12-12 LAB — SODIUM, URINE, RANDOM: Sodium, Ur: 114 mEq/L

## 2012-12-12 MED ORDER — SODIUM CHLORIDE 0.9 % IV SOLN: INTRAVENOUS | Status: DC

## 2012-12-12 MED ORDER — DEMECLOCYCLINE HCL 150 MG PO TABS
300.0000 mg | ORAL_TABLET | Freq: Four times a day (QID) | ORAL | Status: DC
  Administered 2012-12-12 – 2012-12-17 (×20): 300 mg via ORAL
  Filled 2012-12-12 (×28): qty 2

## 2012-12-12 NOTE — Progress Notes (Signed)
Subjective: Nursing staff reports continued confusion, and some lethargy.  Objective: Vital signs in last 24 hours: Filed Vitals:   12/12/12 0417 12/12/12 0500 12/12/12 0600 12/12/12 0700  BP: 145/57 164/68 167/74 159/77  Pulse: 51 51 51 50  Temp:      TempSrc:      Resp: 15 13 16 15   Height:      Weight:      SpO2: 97% 98% 97% 98%    Intake/Output from previous day: 05/31 0701 - 06/01 0700 In: 3574.6 [P.O.:890; I.V.:2534.6; IV Piggyback:150] Out: 915 [Urine:915] Intake/Output this shift:    Physical Exam:  Opening eyes, following commands. Moving all 4 extremities well.  CBC  Recent Labs  12/11/12 0545  WBC 15.9*  HGB 16.8  HCT 44.7  PLT 199   BMET  Recent Labs  12/11/12 0545 12/12/12 0441  NA 124* 115*  K 4.0 5.1  CL 89* 81*  CO2 23 21  GLUCOSE 117* 111*  BUN 16 14  CREATININE 0.40* 0.30*  CALCIUM 8.9 8.8    Assessment/Plan: Patient is developed worsening SIADH. IVF decreased and 3% saline at 10 cc per hour added yesterday. Despite this sodium has decreased from 124 to 115. Have decreased IVF to 25 cc per hour, continuing 3% saline at 10 cc per hour, but have added Demeclocycline 300 mg by mouth every 6 hours. We'll check urine sodium by in and out catheterization. Will check BMET in a.m.  We'll check ventricular size with CT of brain without contrast in a.m.  Have encouraged nursing staff to allow added salt and salty foods to diet (potato chips etc.).  Hewitt Shorts, MD 12/12/2012, 7:51 AM

## 2012-12-12 NOTE — Progress Notes (Signed)
CRITICAL VALUE ALERT  Critical value received:  Sodium 115  Date of notification: 12/12/2012  Time of notification: 0625  Critical value read back:yes  Nurse who received alert: Jonelle Sidle  MD notified (1st page):  Newell Coral  Time of first page:  0626  MD notified (2nd page):  Time of second page:  Responding MD:  Newell Coral  Time MD responded:  [phone]

## 2012-12-13 ENCOUNTER — Encounter (HOSPITAL_COMMUNITY): Payer: Self-pay | Admitting: Radiology

## 2012-12-13 ENCOUNTER — Inpatient Hospital Stay (HOSPITAL_COMMUNITY): Payer: No Typology Code available for payment source

## 2012-12-13 DIAGNOSIS — E871 Hypo-osmolality and hyponatremia: Secondary | ICD-10-CM | POA: Diagnosis not present

## 2012-12-13 LAB — BASIC METABOLIC PANEL
BUN: 19 mg/dL (ref 6–23)
BUN: 15 mg/dL (ref 6–23)
CO2: 20 mEq/L (ref 19–32)
CO2: 27 mEq/L (ref 19–32)
Calcium: 9.1 mg/dL (ref 8.4–10.5)
Calcium: 9.2 mg/dL (ref 8.4–10.5)
Chloride: 84 mEq/L — ABNORMAL LOW (ref 96–112)
Chloride: 88 mEq/L — ABNORMAL LOW (ref 96–112)
Creatinine, Ser: 0.41 mg/dL — ABNORMAL LOW (ref 0.50–1.35)
Creatinine, Ser: 0.48 mg/dL — ABNORMAL LOW (ref 0.50–1.35)
GFR calc Af Amer: >90 mL/min (ref >90)
GFR calc Af Amer: >90 mL/min (ref >90)
GFR calc non Af Amer: >90 mL/min (ref >90)
GFR calc non Af Amer: >90 mL/min (ref >90)
Glucose, Bld: 93 mg/dL (ref 70–99)
Glucose, Bld: 100 mg/dL — ABNORMAL HIGH (ref 70–99)
Potassium: 4.7 mEq/L (ref 3.5–5.1)
Potassium: 3.6 meq/L (ref 3.5–5.1)
Sodium: 117 mEq/L — CL (ref 135–145)
Sodium: 124 mEq/L — ABNORMAL LOW (ref 135–145)

## 2012-12-13 LAB — SODIUM: Sodium: 121 mEq/L — ABNORMAL LOW (ref 135–145)

## 2012-12-13 LAB — SODIUM, URINE, RANDOM: Sodium, Ur: 41 meq/L

## 2012-12-13 LAB — OSMOLALITY, URINE: Osmolality, Ur: 360 mOsm/kg — ABNORMAL LOW (ref 390–1090)

## 2012-12-13 LAB — OSMOLALITY: Osmolality: 253 mOsm/kg — ABNORMAL LOW (ref 275–300)

## 2012-12-13 MED ORDER — SODIUM CHLORIDE 3 % IV SOLN
INTRAVENOUS | Status: DC
  Administered 2012-12-13 (×3): via INTRAVENOUS
  Filled 2012-12-13 (×3): qty 500

## 2012-12-13 MED ORDER — NIMODIPINE 60 MG/20ML PO SOLN
60.0000 mg | ORAL | Status: DC
  Administered 2012-12-13 – 2012-12-17 (×22): 60 mg via ORAL
  Filled 2012-12-13 (×28): qty 20

## 2012-12-13 NOTE — Progress Notes (Signed)
PULMONARY  / CRITICAL CARE MEDICINE  Name: Dennis Wyatt MRN: [id number] DOB: 10/23/1960    ADMISSION DATE:  12/03/2012 CONSULTATION DATE:  12/03/2012  REFERRING MD :  Dr. Yetta Barre PRIMARY SERVICE: Neuro surg  CHIEF COMPLAINT:  VDRF  BRIEF PATIENT DESCRIPTION:  52 yo male smoker admitted 5/23 with headache and mental status change 2nd to [facility] with aneurysm.  Had neuro-IR intervention, and remained on vent post-procedure.  PCCM consulted for vent/medical management. Course complicated by rapid drop in Na from 142 on 5/28 to 124 on 5/21 & 115 on 6/1  SIGNIFICANT EVENTS:  5/23 S/P coiling of aneurysm, IVC drain  STUDIES: 5/23 CT head >> Large [facility] 5/28 CT Head >> stable diffuse SAH, decreased IVH, ventric out, improved mass effect 6/2 head CT >> Stable to slight increased size of the lateral ventricles. Elsewhere the ventricular system  is stable.  Stable to mildly decreased volume of intracranial hemorrhage (IVH, SAH, and right ACA parenchymal hemorrhage).    LINES / TUBES: ET tube 5/23>>>5/24 IVC drain 5/23 >> 5/28 IVC drain 5/28 >>  Lt radial aline 5/23 >> 5/26  SUBJECTIVE:  Appears confused with mittens on afebrile  VITAL SIGNS: Temp:  [97.4 F (36.3 C)-98.4 F (36.9 C)] 97.6 F (36.4 C) (06/02 0700) Pulse Rate:  [48-57] 52 (06/02 0700) Resp:  [10-18] 14 (06/02 0700) BP: (126-174)/(63-134) 126/84 mmHg (06/02 0700) SpO2:  [96 %-99 %] 96 % (06/02 0700)  INTAKE / OUTPUT: Intake/Output     06/01 0701 - 06/02 0700 06/02 0701 - 06/03 0700   P.O.     I.V. (mL/kg) 815 (6.6)    IV Piggyback     Total Intake(mL/kg) 815 (6.6)    Urine (mL/kg/hr) 450 (0.2)    Total Output 450     Net +365          Urine Occurrence 13 x    Stool Occurrence 3 x      PHYSICAL EXAMINATION: General: No distress, awake and interactive. Neuro: follows commands, oriented x1  to self, slight weakness noted Rt lower extremity >> moves all other extremities HEENT: Pupils reactive, IVC  out Cardiovascular: regular, no murmur Lungs: no wheeze Abdomen:  Soft, non tender Musculoskeletal: no edema Skin: no rashes  LABS:  Recent Labs Lab 12/07/12 0540 12/08/12 0448 12/11/12 0545 12/12/12 0441 12/13/12 0430  HGB 15.8 16.2 16.8  --   --   WBC 11.3* 13.1* 15.9*  --   --   PLT 131* 148* 199  --   --   NA 142 142 124* 115* 117*  K 4.7 4.7 4.0 5.1 4.7  CL 110 105 89* 81* 84*  CO2 23 28 23 21 20   GLUCOSE 105* 101* 117* 111* 93  BUN 18 15 16 14 19   CREATININE 0.51 0.73 0.40* 0.30* 0.41*  CALCIUM 8.4 8.9 8.9 8.8 9.1  MG 2.0 2.1  --   --   --   PHOS 2.4 2.8  --   --   --    No results found for this basename: GLUCAP,  in the last 168 hours  Imaging: Ct Head Wo Contrast  12/13/2012   *RADIOLOGY REPORT*  Clinical Data: 52 year old male status post subarachnoid hemorrhage and large anterior communicating artery aneurysm endovascular coiling.  Ventriculomegaly.  CT HEAD WITHOUT CONTRAST  Technique:  Contiguous axial images were obtained from the base of the skull through the vertex without contrast.  Comparison: 12/11/2012 and earlier.  Findings: Study is intermittently degraded by motion artifact despite  repeated imaging attempts.  Stable visualized osseous structures.  Stable paranasal sinuses and mastoids.  External ventricular drain is been removed.  Postoperative changes to the right superior scalp soft tissues.  Lateral ventricle size is stable to mildly increased. Intraventricular gas has decreased.  Intraventricular hemorrhage primarily in the occipital horns has mildly decreased. Third and fourth ventricle size is stable.  Artifact from the day and eight, coil pack.  Parenchymal hemorrhage/edema in the right ACA territory inferiorly is stable. Small volume of subarachnoid hemorrhage is stable.  Basilar cisterns are stable.  No new intracranial hemorrhage. Stable gray- white matter differentiation throughout the brain.  No new acute cortically based infarct. No midline shift.   IMPRESSION: 1.  External ventricular drain removed.  Stable to slight increased size of the lateral ventricles.  Elsewhere the ventricular system is stable. 2.  Stable to mildly decreased volume of intracranial hemorrhage (IVH, SAH, and right ACA parenchymal hemorrhage).  Stable basilar cisterns. 3.  No new intracranial abnormality.   Original Report Authenticated By: Erskine Speed, M.D.     ASSESSMENT / PLAN:   NEUROLOGIC A: SAH 2nd to [facility] aneurysm s/p coiling 5/23 by IR, and IVC drain (replaced 5/28 after pt pulled) TCD 5/28 mildly elevated velocities R anterior cerebral, L vertebral, basilar arteries. .  WBC rising but afebrile. P:   - keppra off -IVC drain out per neurosurgery- mass effect better but still w significant residual hemorrhage on CT 5/28 -continue nimotop -TCD's 5/28 with some suggestion vasospasm but no clinical evidence to support this -recheck TCD's based on NSGY plans -prn tylenol, oxycodone, morphine for headache > increased 5/28  RENAL - Hyponatremia High urine Na s/o SIADH, appears euvolemic- dry P: chk U na & U osm, se osm Agree with 3% saline @ 50/h - Since dropped acutely , can raise upto 8-10/24h - rechk every 8h after starting  Place CVL demeclocycline  CARDIOVASCULAR A: Hx of HTN. P:  -continue hydralazine -prn labetalol  GASTROINTESTINAL A: Nutrition. P:   -heart healthy diet  HEMATOLOGIC A: Thrombocytopenia- resolved P:  -f/u CBC  ENDOCRINE A: Hyperglycemia >> no hx of DM, HgA1c > 5.4 P:   -monitor blood sugar on BMET  D/w Dr Danielle Dess Care during the described time interval was provided by me and/or other providers on the critical care team.  I have reviewed this patient's available data, including medical history, events of note, physical examination and test results as part of my evaluation  CC time x 13m  Cyril Mourning MD. FCCP. [place] [facility] Pager [phone] If no response call [phone]

## 2012-12-13 NOTE — Procedures (Signed)
Central Venous Catheter Insertion Procedure Note Roddy Bellamy [id number] 19-Jun-1961  Procedure: Insertion of Central Venous Catheter Indications: Drug and/or fluid administration  Procedure Details Consent: Risks of procedure as well as the alternatives and risks of each were explained to the (patient/caregiver).  Consent for procedure obtained. Time Out: Verified patient identification, verified procedure, site/side was marked, verified correct patient position, special equipment/implants available, medications/allergies/relevent history reviewed, required imaging and test results available.  Performed Real time [place] used to ID and cannulate the vessel  Maximum sterile technique was used including antiseptics, cap, gloves, gown, hand hygiene, mask and sheet. Skin prep: Chlorhexidine; local anesthetic administered A antimicrobial bonded/coated triple lumen catheter was placed in the right internal jugular vein using the Seldinger technique.  Evaluation Blood flow good Complications: No apparent complications Patient did tolerate procedure well. Chest X-ray ordered to verify placement.  CXR: pending.  Ultrasound used for site verification, live visualisation of needle entry & guidewire prior to dilation Supervised by me Prince Rome 12/13/2012, 9:18 AM

## 2012-12-13 NOTE — Progress Notes (Signed)
Physical Therapy Treatment Patient Details Name: Dennis Wyatt MRN: [id number] DOB: [birth year] Today's Date: 12/13/2012 Time: 1411-1440 PT Time Calculation (min): 29 min  PT Assessment / Plan / Recommendation Comments on Treatment Session  Patient tolerating increased distance with gait with improved upright posture with hand held assist as opposed to walker.  Left side held back and right foot farther out to the side with neurologic and possible orthopedic issues as well with pt c/o pain.  Still needing extensive assist for safety due to impaired safety and deficit awareness.  Falls asleep quickly after walking.    Follow Up Recommendations  CIR     Does the patient have the potential to tolerate intense rehabilitation   Yes  Barriers to Discharge        Equipment Recommendations  Rolling walker with 5" wheels       Frequency Min 4X/week   Plan Discharge plan remains appropriate    Precautions / Restrictions Precautions Precautions: Fall Precaution Comments: Impulsive, wrist restraints, mitt restraints   Pertinent Vitals/Pain C/o left hip and knee pain with ambulation; unrated    Mobility  Bed Mobility Supine to Sit: 1: +2 Total assist Supine to Sit: Patient Percentage: 40% Sitting - Scoot to Edge of Bed: 1: +1 Total assist Details for Bed Mobility Assistance: slow, stiff, resistant due to sleepy Transfers Sit to Stand: 1: +2 Total assist Sit to Stand: Patient Percentage: 60% Stand to Sit: 1: +2 Total assist Stand to Sit: Patient Percentage: 70% Details for Transfer Assistance: increased time with stiff quality and antalgic left hip and knee Ambulation/Gait Ambulation/Gait Assistance: 1: +2 Total assist (+1 following with chair and 1 with IV pole) Ambulation/Gait: Patient Percentage: 70% Ambulation Distance (Feet): 90 Feet (and 70' seated rest in between) Assistive device: 2 person hand held assist Ambulation/Gait Assistance Details: leans to left and assist +2 for  balance  Gait Pattern: Ataxic;Trunk rotated posteriorly on left;Decreased hip/knee flexion - left;Antalgic      PT Goals Acute Rehab PT Goals Pt will go Supine/Side to Sit: with supervision PT Goal: Supine/Side to Sit - Progress: Progressing toward goal Pt will go Sit to Stand: with min assist PT Goal: Sit to Stand - Progress: Progressing toward goal Pt will go Stand to Sit: with min assist PT Goal: Stand to Sit - Progress: Progressing toward goal Pt will Ambulate: 51 - 150 feet;with min assist;with least restrictive assistive device PT Goal: Ambulate - Progress: Progressing toward goal  Visit Information  Last PT Received On: 12/13/12 Assistance Needed: +2    Subjective Data  Subjective: Whoa, just hold on a minute!   Cognition  Cognition Arousal/Alertness: Awake/alert Behavior During Therapy: Impulsive Overall Cognitive Status: Impaired/Different from baseline Area of Impairment: Orientation;Attention;Memory;Safety/judgement;Problem solving Orientation Level: Place;Time;Situation Current Attention Level: Focused Memory: Decreased short-term memory Safety/Judgement: Decreased awareness of safety;Decreased awareness of deficits Problem Solving: Requires verbal cues;Requires tactile cues    Balance  Static Sitting Balance Static Sitting - Level of Assistance: 3: Mod assist;4: Min assist Static Sitting - Comment/# of Minutes: edge of bed x about 4 minutes due to slow to arouse and resistant to out of bed Static Standing Balance Static Standing - Level of Assistance: 1: +2 Total assist  End of Session PT - End of Session Equipment Utilized During Treatment: Gait belt Activity Tolerance: Patient limited by fatigue Patient left: with call bell/phone within reach;with nursing in room (kept restraints off sitter in room to let pt eat)   GP     [facility] 12/13/2012,  3:23 PM [place], [place] [phone] 12/13/2012

## 2012-12-13 NOTE — Progress Notes (Signed)
Patient ID: Dennis Wyatt, male   DOB: Oct 25, 1960, 52 y.o.   MRN: [id number] BP 131/71  Pulse 54  Temp(Src) 97.4 F (36.3 C) (Oral)  Resp 16  Ht 6\' 1"  (1.854 m)  Wt 124.3 kg (274 lb 0.5 oz)  BMI 36.16 kg/m2  SpO2 95% Lethargic, perrl, full eom Moving all extremities Head CT shows small ventricles, intraventricular blood Believe lethargy due to sodium.Has increased with dual therapy of 3%saline, and the demeclocycline.

## 2012-12-13 NOTE — Progress Notes (Signed)
Patient ID: Dennis Wyatt, male   DOB: [birth year], 52 y.o.   MRN: [id number] : Outpatient sodium persisting at 117 despite 3% saline at 10 cc per hour. Patient remains confused. Have advised to increase 3% saline to 50 cc per hour for 12 hours recheck be met later dissection. Patient may need central line for hypertonic saline.

## 2012-12-13 NOTE — Progress Notes (Signed)
12/13/12 0700  Pharmacy called to state that it is contraindicated to run 50cc of hypertonic saline through a PIV. Pharmacy recommends CVC placement.   Elisha Headland RN

## 2012-12-13 NOTE — Progress Notes (Signed)
SLP Cancellation Note  Patient Details Name: Dennis Wyatt MRN: [id number] DOB: 03-26-1961   Cancelled treatment:       Reason Eval/Treat Not Completed: Other (comment) (Currently receiving nursing care. Will f/u. )  Ferdinand Lango MA, CCC-SLP [phone]  Dennis Wyatt 12/13/2012, 11:13 AM

## 2012-12-13 NOTE — Progress Notes (Signed)
Pt. OOB with PT, walked around the unit with 2 assist and a safety belt.  Pt. Sat in chair for about an hour and a half and ate his lunch while sitting up.

## 2012-12-13 NOTE — Consult Note (Signed)
Rehab admissions - Noted ongoing medical issues.  Patient did participate with PT on 05/31.  OT has not seen patient since patient was on bedrest recently.  Will wait for updated therapy notes and medical stability before pursuing any rehab.  Call me for questions.  #[phone]

## 2012-12-13 NOTE — Progress Notes (Signed)
CRITICAL VALUE ALERT  Critical value received:  Na - 117  Date of notification:  12/13/12  Time of notification:  0530  Critical value read back: YES  Nurse who received alert:  Elisha Headland RN   MD notified (1st page):  Elsner  Time of first page:  0630 *MD that was on call was in [facility] OR at this time in surgery   Responding MD:  Danielle Dess  Time MD responded:  0630  I:  -Increase Hypertonic Saline to 50cc -D/c NS @ 25cc -Contact CCM for CVC catheter placement during AM shift - eLink MD made aware.   Elisha Headland RN

## 2012-12-14 LAB — BASIC METABOLIC PANEL
BUN: 14 mg/dL (ref 6–23)
CO2: 26 meq/L (ref 19–32)
Calcium: 9 mg/dL (ref 8.4–10.5)
Chloride: 98 mEq/L (ref 96–112)
Creatinine, Ser: 0.47 mg/dL — ABNORMAL LOW (ref 0.50–1.35)
GFR calc Af Amer: >90 mL/min (ref >90)
GFR calc non Af Amer: >90 mL/min (ref >90)
Glucose, Bld: 109 mg/dL — ABNORMAL HIGH (ref 70–99)
Potassium: 3.3 mEq/L — ABNORMAL LOW (ref 3.5–5.1)
Sodium: 133 mEq/L — ABNORMAL LOW (ref 135–145)

## 2012-12-14 LAB — CBC
HCT: 45 % (ref 39.0–52.0)
Hemoglobin: 17.2 g/dL — ABNORMAL HIGH (ref 13.0–17.0)
MCH: 32.8 pg (ref 26.0–34.0)
MCHC: 38.2 g/dL — ABNORMAL HIGH (ref 30.0–36.0)
MCV: 85.7 fL (ref 78.0–100.0)
Platelets: 183 10*3/uL (ref 150–400)
RBC: 5.25 MIL/uL (ref 4.22–5.81)
RDW: 11.9 % (ref 11.5–15.5)
WBC: 11.8 10*3/uL — ABNORMAL HIGH (ref 4.0–10.5)

## 2012-12-14 LAB — SODIUM
Sodium: 130 mEq/L — ABNORMAL LOW (ref 135–145)
Sodium: 136 mEq/L (ref 135–145)
Sodium: 136 meq/L (ref 135–145)

## 2012-12-14 MED ORDER — POTASSIUM CHLORIDE CRYS ER 20 MEQ PO TBCR
40.0000 meq | EXTENDED_RELEASE_TABLET | Freq: Once | ORAL | Status: AC
  Administered 2012-12-14: 40 meq via ORAL
  Filled 2012-12-14: qty 2

## 2012-12-14 MED ORDER — POTASSIUM CHLORIDE CRYS ER 20 MEQ PO TBCR: 40.0000 meq | EXTENDED_RELEASE_TABLET | Freq: Once | ORAL | Status: AC

## 2012-12-14 NOTE — Progress Notes (Signed)
Occupational Therapy Treatment Patient Details Name: Dennis Wyatt MRN: [id number] DOB: [birth year] Today's Date: 12/14/2012 Time: 8295-6213 OT Time Calculation (min): 23 min  OT Assessment / Plan / Recommendation Comments on Treatment Session co treat with SLP    Follow Up Recommendations  CIR                Plan Discharge plan remains appropriate    Precautions / Restrictions Precautions Precautions: Fall Restrictions Weight Bearing Restrictions: No       ADL  Grooming: Performed;Wash/dry hands;Wash/dry face;Minimal assistance;Other (comment) (max VC to stay on task) Where Assessed - Grooming: Unsupported sitting Lower Body Dressing: Performed;Maximal assistance Where Assessed - Lower Body Dressing: Supported sit to stand      OT Goals ADL Goals ADL Goal: Grooming - Progress: Progressing toward goals ADL Goal: Lower Body Bathing - Progress: Progressing toward goals  Visit Information  Last OT Received On: 12/14/12 Assistance Needed: +2    Subjective Data  Subjective: I dont know why i am so sore      Cognition  Cognition Arousal/Alertness: Awake/alert Behavior During Therapy: Impulsive Overall Cognitive Status: Impaired/Different from baseline Area of Impairment: Orientation;Attention;Memory;Safety/judgement;Problem solving Orientation Level: Place;Time;Situation Current Attention Level: Focused Memory: Decreased short-term memory Safety/Judgement: Decreased awareness of safety;Decreased awareness of deficits Problem Solving: Requires verbal cues;Requires tactile cues    Mobility  Bed Mobility Supine to Sit: 1: +2 Total assist Supine to Sit: Patient Percentage: 60% Sitting - Scoot to Edge of Bed: 1: +1 Total assist Details for Bed Mobility Assistance: slow, stiff Transfers Transfers: Stand to Sit Sit to Stand: 1: +2 Total assist Sit to Stand: Patient Percentage: 60% Stand to Sit: 1: +2 Total assist Stand to Sit: Patient Percentage: 70% Details for  Transfer Assistance: increased time with stiff quality and antalgic left hip and knee          End of Session OT - End of Session Activity Tolerance: Patient tolerated treatment well Patient left: in chair Nurse Communication: Mobility status  GO     Alba Cory 12/14/2012, 3:27 PM

## 2012-12-14 NOTE — Progress Notes (Signed)
eLink Physician Progress Note and Electrolyte Replacement  Patient Name: Dennis Wyatt DOB: 1961/02/05 MRN: [id number]  Date of Service  12/14/2012   HPI/Events of Note    Recent Labs Lab 12/07/12 0540 12/08/12 0448 12/11/12 0545 12/12/12 0441 12/13/12 0430 12/13/12 1235 12/13/12 1800  NA 142 142 124* 115* 117* 121* 124*  K 4.7 4.7 4.0 5.1 4.7  --  3.6  CL 110 105 89* 81* 84*  --  88*  CO2 23 28 23 21 20   --  27  GLUCOSE 105* 101* 117* 111* 93  --  100*  BUN 18 15 16 14 19   --  15  CREATININE 0.51 0.73 0.40* 0.30* 0.41*  --  0.48*  CALCIUM 8.4 8.9 8.9 8.8 9.1  --  9.2  MG 2.0 2.1  --   --   --   --   --   PHOS 2.4 2.8  --   --   --   --   --     Estimated Creatinine Clearance: 147.6 ml/min (by C-G formula based on Cr of 0.48).  Intake/Output     06/02 0701 - 06/03 0700   I.V. (mL/kg) 680 (5.5)   Total Intake(mL/kg) 680 (5.5)   Net +680       Urine Occurrence 5 x   Stool Occurrence 1 x    - I/O DETAILED x 24h    Total I/O In: 300 [I.V.:300] Out: -  - I/O THIS SHIFT    ASSESSMENT Na rising well  eICURN Interventions  Reduce 3% saline rate from 50cc to 25cc/h   ASSESSMENT: MAJOR ELECTROLYTE      Dr. Kalman Shan, M.D., [facility].C.P Pulmonary and Critical Care Medicine Staff Physician  System [place] [facility] Pager: [phone], If no answer or between  15:00h - 7:00h: call [phone]  12/14/2012 12:15 AM

## 2012-12-14 NOTE — Progress Notes (Signed)
Hypertonic Saline gtt stopped per order written 06:32 by Susa Raring.

## 2012-12-14 NOTE — Progress Notes (Signed)
Speech Language Pathology Treatment Patient Details Name: Dennis Wyatt MRN: [id number] DOB: [birth year] Today's Date: 12/14/2012 Time: 4098-1191 SLP Time Calculation (min): 25 min  Assessment / Plan / Recommendation Clinical Impression  Treatment focused on cognitive recovery. Patient making steady progress with functional goals, able to sustain attention to basic functional ADL with moderate cueing. Moderate-max cues additionally provided for topic maintenance. Patient oriented to self and place, required max cues for orientation to situation and for intellectual awareness of deficits. Safety continues to be a concern given poor carryover of information. SLP will continue to f/u.     SLP Plan  Continue with current plan of care    Pertinent Vitals/Pain Head ache, RN aware  SLP Goals  SLP Goals Potential to Achieve Goals: Good Progress/Goals/Alternative treatment plan discussed with pt/caregiver and they: Agree SLP Goal #1: Maintain sustained attention to complete 3-4 step basic ADL with min verbal cues to initiate task to completion SLP Goal #1 - Progress: Progressing toward goal SLP Goal #2: Improve intellectual  and emergent awarenes by correctly stating current physical deficits and possible problems that could happen during completion of ADL's in current environment SLP Goal #2 - Progress: Progressing toward goal SLP Goal #3: Improve orientation to time, place, and situation with min assist to refer to memory aids SLP Goal #3 - Progress: Progressing toward goal       Treatment Treatment focused on: Cognition Skilled Treatment: Treatment focused on cognitive recovery. Patient making steady progress with functional goals, able to sustain attention to basic functional ADL with moderate cueing. Moderate-max cues additionally provided for topic maintenance. Patient oriented to self and place, required max cues for orientation to situation and for intellectual awareness of deficits.  Safety continues to be a concern given poor carryover of information. SLP will continue to f/u.    GO   Ferdinand Lango MA, CCC-SLP [phone]   Ferdinand Lango Meryl 12/14/2012, 3:23 PM

## 2012-12-14 NOTE — Progress Notes (Signed)
PULMONARY  / CRITICAL CARE MEDICINE  Name: Dennis Wyatt MRN: [id number] DOB: 10/08/1960    ADMISSION DATE:  12/03/2012 CONSULTATION DATE:  12/03/2012  REFERRING MD :  Dr. Yetta Barre PRIMARY SERVICE: Neuro surg  CHIEF COMPLAINT:  VDRF  BRIEF PATIENT DESCRIPTION:  52 yo male smoker admitted 5/23 with headache and mental status change 2nd to [facility] with aneurysm.  Had neuro-IR intervention, and remained on vent post-procedure.  PCCM consulted for vent/medical management. Course complicated by rapid drop in Na from 142 on 5/28 to 124 on 5/21 & 115 on 6/1  SIGNIFICANT EVENTS:  5/23 S/P coiling of aneurysm, IVC drain 6/02 Severe hyponatremia. CVL placed for 3% saline   STUDIES: 5/23 CT head >> Large [facility] 5/28 CT Head >> stable diffuse SAH, decreased IVH, ventric out, improved mass effect 6/2 head CT >> Stable to slight increased size of the lateral ventricles. Elsewhere the ventricular system  is stable.  Stable to mildly decreased volume of intracranial hemorrhage (IVH, SAH, and right ACA parenchymal hemorrhage).    LINES / TUBES: ET tube 5/23>>>5/24 IVC drain 5/23 >> 5/28 IVC drain 5/28 >> out Lt radial aline 5/23 >> 5/26 R IJ CVL 6/02 >>   SUBJECTIVE:  Apparently much improved. Off hypertonic saline. A little confused but oriented  VITAL SIGNS: Temp:  [97.3 F (36.3 C)-97.7 F (36.5 C)] 97.7 F (36.5 C) (06/03 0732) Pulse Rate:  [49-63] 57 (06/03 1300) Resp:  [8-19] 11 (06/03 1300) BP: (97-158)/(45-98) 131/55 mmHg (06/03 1300) SpO2:  [93 %-97 %] 94 % (06/03 1300)  INTAKE / OUTPUT: Intake/Output     06/02 0701 - 06/03 0700 06/03 0701 - 06/04 0700   P.O.  240   I.V. (mL/kg) 867.5 (7)    Total Intake(mL/kg) 867.5 (7) 240 (1.9)   Urine (mL/kg/hr)  400 (0.5)   Total Output   400   Net +867.5 -160        Urine Occurrence 8 x 3 x   Stool Occurrence 1 x 1 x     PHYSICAL EXAMINATION: General: No distress, awake and interactive. Neuro: MAEs, calm, + F/C HEENT:  WNL Cardiovascular: RRR s M Lungs: Clear Abdomen:  Soft, non tender Ext: no edema   LABS: BMET    Component Value Date/Time   NA 136 12/14/2012 1200   K 3.3* 12/14/2012 0500   CL 98 12/14/2012 0500   CO2 26 12/14/2012 0500   GLUCOSE 109* 12/14/2012 0500   BUN 14 12/14/2012 0500   CREATININE 0.47* 12/14/2012 0500   CALCIUM 9.0 12/14/2012 0500   GFRNONAA >90 12/14/2012 0500   GFRAA >90 12/14/2012 0500    CBC    Component Value Date/Time   WBC 11.8* 12/14/2012 0500   RBC 5.25 12/14/2012 0500   HGB 17.2* 12/14/2012 0500   HCT 45.0 12/14/2012 0500   PLT 183 12/14/2012 0500   MCV 85.7 12/14/2012 0500   MCH 32.8 12/14/2012 0500   MCHC 38.2* 12/14/2012 0500   RDW 11.9 12/14/2012 0500   LYMPHSABS 0.5* 12/04/2012 0400   MONOABS 0.4 12/04/2012 0400   EOSABS 0.0 12/04/2012 0400   BASOSABS 0.0 12/04/2012 0400     No results found for this basename: GLUCAP,  in the last 168 hours  CXR: no new    ASSESSMENT / PLAN:   NEUROLOGIC A: SAH 2nd to ACOM aneurysm s/p coiling 5/23 AMS 6/02 due to hyponatremia - much improved  P:   - Mgmt per NS  Renal  A: Hyponatremia - improved wih  3% saline 6/02  Cerebral salt wasting vs SIADH Hypokalemia P: Monitor off hypertonic saline Cont demeclocycline   CARDIOVASCULAR A: Hx of HTN. P:  -continue current rx   GASTROINTESTINAL A: Nutrition. P:   -Tolerating PO diet  HEMATOLOGIC A: Thrombocytopenia- resolved P:  -f/u CBC as needed  ENDOCRINE A: Hyperglycemia, resolved P:   -monitor blood sugar on BMET     Cyril Mourning MD. FCCP. [place] [facility] Pager [phone] If no response call [phone]

## 2012-12-14 NOTE — Progress Notes (Signed)
Patient ID: Dennis Wyatt, male   DOB: Nov 09, 1960, 52 y.o.   MRN: [id number] BP 134/70  Pulse 59  Temp(Src) 97.5 F (36.4 C) (Oral)  Resp 9  Ht 6\' 1"  (1.854 m)  Wt 124.3 kg (274 lb 0.5 oz)  BMI 36.16 kg/m2  SpO2 94% Alert , oriented to person, follows some orders. Speech is clear, fluent Perrl, full eom Symmetric facies, tongue and uvula midline Moving all extremities well Just pulled his CVC out. Na is now 136, can do without the 3%Na at this time. Will continue with the demeclocycline.  Improved neuro status.

## 2012-12-14 NOTE — Progress Notes (Signed)
Pt noted to have pulled out his central venous catheter at 1845. E Link and Dr. Franky Macho notified.

## 2012-12-14 NOTE — Progress Notes (Signed)
[facility] ADULT ICU REPLACEMENT PROTOCOL FOR AM LAB REPLACEMENT ONLY  The patient does not apply for the [facility] Adult ICU Electrolyte Replacment Protocol based on the criteria listed below:    Is urine output >/= 0.5 ml/kg/hr for the last 6 hours? no Patient's UOP is 0 ml/kg/hr  Abnormal electrolyte(s): K3.3   If a panic level lab has been reported, has the CCM MD in charge been notified? yes.   Physician:  Dr Bea Laura Deterding  Dennis Wyatt 12/14/2012 6:40 AM

## 2012-12-15 LAB — BASIC METABOLIC PANEL
BUN: 14 mg/dL (ref 6–23)
CO2: 25 meq/L (ref 19–32)
Calcium: 9.7 mg/dL (ref 8.4–10.5)
Chloride: 104 mEq/L (ref 96–112)
Creatinine, Ser: 0.44 mg/dL — ABNORMAL LOW (ref 0.50–1.35)
GFR calc Af Amer: >90 mL/min (ref >90)
GFR calc non Af Amer: >90 mL/min (ref >90)
Glucose, Bld: 105 mg/dL — ABNORMAL HIGH (ref 70–99)
Potassium: 4.2 mEq/L (ref 3.5–5.1)
Sodium: 137 meq/L (ref 135–145)

## 2012-12-15 LAB — SODIUM
Sodium: 135 mEq/L (ref 135–145)
Sodium: 135 meq/L (ref 135–145)
Sodium: 139 mEq/L (ref 135–145)

## 2012-12-15 MED ORDER — SODIUM CHLORIDE 1 G PO TABS
1.0000 g | ORAL_TABLET | Freq: Two times a day (BID) | ORAL | Status: DC
  Administered 2012-12-16 – 2012-12-17 (×3): 1 g via ORAL
  Filled 2012-12-15 (×5): qty 1

## 2012-12-15 MED ORDER — BOOST / RESOURCE BREEZE PO LIQD
1.0000 | Freq: Three times a day (TID) | ORAL | Status: DC
  Administered 2012-12-15 – 2012-12-17 (×7): 1 via ORAL

## 2012-12-15 NOTE — Progress Notes (Signed)
Rehab admissions - I met with patient and his niece this morning.  Niece and her aunt plan to provide care after discharge for patient.  Therapies are still recommending inpatient rehab.  Niece says she prefers inpatient rehab here inside hospital.  I have faxed information to [facility] requesting inpatient rehab.  If we do not get approval, may need to consider SNF upon discharge.  Call me for questions.  #[phone]

## 2012-12-15 NOTE — Progress Notes (Signed)
Patient ID: Dennis Wyatt, male   DOB: [birth year], 52 y.o.   MRN: [id number] BP 144/63  Pulse 57  Temp(Src) 97.5 F (36.4 C) (Oral)  Resp 11  Ht 6\' 1"  (1.854 m)  Wt 124.3 kg (274 lb 0.5 oz)  BMI 36.16 kg/m2  SpO2 96% Alert, oriented to person only.  Perrl,  Speech is clear and fluent Confused, but following commands. Moving all extremities well Improving slowly. Will add na to diet.

## 2012-12-15 NOTE — Progress Notes (Signed)
Hyponatremia resolved. He removed his CVL this AM. There are no other PCCM issues that are not already addressed by primary team.  PCCM will sign off. Please call if we can be of further assistance  Billy Fischer, MD ; [facility] [phone].  After 5:30 PM or weekends, call [phone]

## 2012-12-15 NOTE — Progress Notes (Signed)
NUTRITION FOLLOW UP  Intervention:   Resource Breeze po TID, each supplement provides 250 kcal and 9 grams of protein.  Nutrition Dx:   Inadequate oral intake related to decreased appetite as evidenced by meal completion < 50%.  Goal:   Pt to meet >/= 90% of their estimated nutrition needs; not met.   Monitor:   PO intake, weight trend  Assessment:   Pt admitted this am with sudden onset of headache. CT shows large SAH with ICH. Pt s/p coiling. Pt extubated 5/24.  Pt and family member report decreased appetite, consuming < 50% of meals currently. Pt discussed during ICU rounds and with RN.   Height: Ht Readings from Last 1 Encounters:  12/03/12 6\' 1"  (1.854 m)    Weight Status:   Wt Readings from Last 1 Encounters:  12/07/12 274 lb 0.5 oz (124.3 kg)  Admission weight: 253 lb - 300 lb   Re-estimated needs:  Kcal: 2200-2400 Protein: 100-120 grams Fluid: > 2.2 L/day  Skin: incision  Diet Order: General Meal Completion: <50%    Intake/Output Summary (Last 24 hours) at 12/15/12 1128 Last data filed at 12/15/12 0000  Gross per 24 hour  Intake    960 ml  Output      0 ml  Net    960 ml    Last BM: 6/3   Labs:   Recent Labs Lab 12/13/12 1800 12/14/12 0500  12/14/12 1800 12/15/12 12/15/12 0520  NA 124* 133*  < > 136 139 137  K 3.6 3.3*  --   --   --  4.2  CL 88* 98  --   --   --  104  CO2 27 26  --   --   --  25  BUN 15 14  --   --   --  14  CREATININE 0.48* 0.47*  --   --   --  0.44*  CALCIUM 9.2 9.0  --   --   --  9.7  GLUCOSE 100* 109*  --   --   --  105*  < > = values in this interval not displayed.  CBG (last 3)  No results found for this basename: GLUCAP,  in the last 72 hours  Scheduled Meds: . demeclocycline  300 mg Oral Q6H  . NiMODipine  60 mg Oral Q4H  . senna-docusate  1 tablet Oral BID    Continuous Infusions:    Kendell Bane RD, LDN, CNSC [phone] Pager [phone] After Hours Pager

## 2012-12-16 LAB — SODIUM
Sodium: 134 mEq/L — ABNORMAL LOW (ref 135–145)
Sodium: 135 mEq/L (ref 135–145)
Sodium: 135 meq/L (ref 135–145)
Sodium: 135 meq/L (ref 135–145)

## 2012-12-16 NOTE — Progress Notes (Signed)
Rehab admissions - I have not heard back from [facility] regarding possible inpatient rehab admission.  I will follow up once I hear back from insurance carrier.  Call me for questions.  #[phone]

## 2012-12-16 NOTE — Progress Notes (Signed)
Physical Therapy Treatment Patient Details Name: Dennis Wyatt MRN: [id number] DOB: 05-22-1961 Today's Date: 12/16/2012 Time: 7829-5621 PT Time Calculation (min): 36 min  PT Assessment / Plan / Recommendation Comments on Treatment Session  Patient with decreased standing ability and with seeming percepual awareness deficit new since last visit.  Protective reactions at edge of bed leaning posteriorly as if feels he is falling.  Pt unable to ambulate today with LE's buckling in standing.  Reported changes to RN.  Will follow acutely.    Follow Up Recommendations  CIR     Does the patient have the potential to tolerate intense rehabilitation   Yes  Barriers to Discharge  None      Equipment Recommendations  Rolling walker with 5" wheels       Frequency Min 4X/week   Plan Discharge plan remains appropriate    Precautions / Restrictions Precautions Precaution Comments: Impulsive, confused   Pertinent Vitals/Pain C/o left hip pain with mobility    Mobility  Bed Mobility Rolling Right: 4: Min assist;With rail Right Sidelying to Sit: 3: Mod assist;With rails Sit to Supine: 4: Min assist Details for Bed Mobility Assistance: increased time, cues for technique Transfers Sit to Stand: 1: +2 Total assist;From chair/3-in-1;From bed Sit to Stand: Patient Percentage: 50% Stand to Sit: 1: +2 Total assist;To chair/3-in-1;To bed Stand to Sit: Patient Percentage: 50% Details for Transfer Assistance: raised height of bed to allow improved success due to 2-3 trials standing and right leg buckling Ambulation/Gait Ambulation/Gait Assistance: 1: +2 Total assist Ambulation/Gait: Patient Percentage: 60% Ambulation Distance (Feet): 4 Feet Assistive device: 2 person hand held assist Ambulation/Gait Assistance Details: in room only due to LE weakness and giving way with increasd UE support needed.  Pt transferred sit>stand x 4, then stand turn to [facility], then took several steps after standing from [facility]  to move away then back towards bed due to LE weakness. Gait Pattern: Shuffle;Ataxic;Narrow base of support;Right flexed knee in stance;Left flexed knee in stance Modified Rankin (Stroke Patients Only) Pre-Morbid Rankin Score: No symptoms Modified Rankin: Moderately severe disability     PT Goals Acute Rehab PT Goals Pt will go Supine/Side to Sit: with supervision PT Goal: Supine/Side to Sit - Progress: Progressing toward goal Pt will go Sit to Supine/Side: with supervision PT Goal: Sit to Supine/Side - Progress: Progressing toward goal Pt will go Sit to Stand: with min assist PT Goal: Sit to Stand - Progress: Not progressing Pt will go Stand to Sit: with min assist PT Goal: Stand to Sit - Progress: Not progressing Pt will Ambulate: 51 - 150 feet;with min assist;with least restrictive assistive device PT Goal: Ambulate - Progress: Not progressing  Visit Information  Last PT Received On: 12/16/12    Subjective Data  Subjective: Think there's a cooking show comes on [employer] at 2:00.   Cognition  Cognition Arousal/Alertness: Awake/alert Behavior During Therapy: Impulsive Overall Cognitive Status: Impaired/Different from baseline Area of Impairment: Orientation;Attention;Memory;Safety/judgement;Problem solving Orientation Level: Place;Time;Situation Memory: Decreased short-term memory    Balance  Static Sitting Balance Static Sitting - Balance Support: Feet supported;Bilateral upper extremity supported Static Sitting - Level of Assistance: 4: Min assist;3: Mod assist Static Sitting - Comment/# of Minutes: Pt fearful at edge of bed as if feels he is falling,  Cues for reassurance and minguard assist throughout, but pt leans posterior when feels he is falling and needs mod support to return to upright Static Standing Balance Static Standing - Balance Support: Bilateral upper extremity supported Static Standing -  Level of Assistance: 1: +2 Total assist  End of Session PT - End of  Session Equipment Utilized During Treatment: Gait belt Activity Tolerance: Patient limited by fatigue Patient left: in bed;with call bell/phone within reach;with bed alarm set;with restraints reapplied   GP     [facility] 12/16/2012, 2:39 PM Sheran Lawless, PT [phone] 12/16/2012

## 2012-12-16 NOTE — Progress Notes (Signed)
Speech Language Pathology Treatment Patient Details Name: Dennis Wyatt MRN: [id number] DOB: [birth year] Today's Date: 12/16/2012 Time: 0905-0920 SLP Time Calculation (min): 15 min  Assessment / Plan / Recommendation Clinical Impression  Treatment focused on cognitive recovery. Patient making steady progress with functional goals, able to sustain attention to basic functional ADL with minimal cues. Increased cueing (moderate) required for alternating attention in a distracting environment. Patient oriented to self independently with borderline awareness of place, time, and situation. Independently seeking out information on external aids however required moderate cueing for carryout and full use to locate accurate information. Safety continues to be a concern given poor carryover of information and decreased intellectual awareness. SLP will continue to f/u.     SLP Plan  Continue with current plan of care    Pertinent Vitals/Pain C/o headache, RN made aware  SLP Goals  SLP Goals Potential to Achieve Goals: Good Progress/Goals/Alternative treatment plan discussed with pt/caregiver and they: Agree SLP Goal #1: Maintain sustained attention to complete 3-4 step basic ADL with min verbal cues to initiate task to completion SLP Goal #1 - Progress: Progressing toward goal SLP Goal #2: Improve intellectual  and emergent awarenes by correctly stating current physical deficits and possible problems that could happen during completion of ADL's in current environment SLP Goal #2 - Progress: Progressing toward goal SLP Goal #3: Improve orientation to time, place, and situation with min assist to refer to memory aids SLP Goal #3 - Progress: Progressing toward goal  General        Treatment Treatment focused on: Cognition Skilled Treatment: Treatment focused on cognitive recovery. Patient making steady progress with functional goals, able to sustain attention to basic functional ADL with minimal cues.  Increased cueing (moderate) required for alternating attention in a distracting environment. Patient oriented to self independently with borderline awareness of place, time, and situation. Independently seeking out information on external aids however required moderate cueing for carryout and full use to locate accurate information. Safety continues to be a concern given poor carryover of information and decreased intellectual awareness. SLP will continue to f/u.    GO   Ferdinand Lango MA, CCC-SLP [phone]   Dennis Wyatt 12/16/2012, 9:29 AM

## 2012-12-16 NOTE — Progress Notes (Signed)
Patient ID: Dennis Wyatt, male   DOB: [birth year], 52 y.o.   MRN: [id number] BP 132/101  Pulse 69  Temp(Src) 98.3 F (36.8 C) (Oral)  Resp 19  Ht 6\' 1"  (1.854 m)  Wt 124.3 kg (274 lb 0.5 oz)  BMI 36.16 kg/m2  SpO2 95% Alert and oriented x 2 to person and hospital, does not know the name of the hospital Follows commands, still confused. Pleasant Moving all extremities well Possible rehab transfer.

## 2012-12-16 NOTE — H&P (Signed)
Physical Medicine and Rehabilitation Admission H&P    Chief Complaint  Patient presents with  . Loss of Consciousness  : HPI: Dennis Wyatt is a 52 y.o. right-handed male with history of hypertension. Admitted 12/03/2012 with headache and dizziness. Blood pressure noted to be 204/123. Cranial CT scan showed subarachnoid hemorrhage with concern for ruptured aneurysm. Four-vessel cerebral arteriogram showed 11 mm x 7 mm irregular right ACA A1 aneurysm. Interventional radiology consulted and patient underwent coiling of aneurysm, IVC drain. Patient did remain intubated for a time followed by critical care medicine. Latest followup cranial CT scan 12/13/2012 stable and external ventricular drain has been removed. Close monitoring of blood pressure as patient remained on nimodipine x21 day protocol and await plan to taper . He is tolerating a regular consistency diet. Patient with bouts of hyponatremia 115 and placed on Declomycin as well as sodium chloride tablets with latest followup sodium level 135 on 12/16/2012. Physical and occupational therapy evaluations completed with noted bouts of confusion. Recommendations have been made for physical medicine rehabilitation consult to consider inpatient rehabilitation services. Patient was felt to be a good candidate for inpatient rehabilitation services and was admitted for a comprehensive rehabilitation program  Review of Systems  Eyes: Positive for photophobia.  Neurological: Positive for dizziness and headaches.  All other systems reviewed and are negative   Past Medical History  Diagnosis Date  . Hypertension    Past Surgical History  Procedure Laterality Date  . Radiology with anesthesia N/A 12/03/2012    Procedure: RADIOLOGY WITH ANESTHESIA;  Surgeon: Sanjeev K Deveshwar, MD;  Location: [facility] OR;  Service: Radiology;  Laterality: N/A;   History reviewed. No pertinent family history. Social History:  reports that he has been smoking.  He does not  have any smokeless tobacco history on file. He reports that  drinks alcohol. He reports that he does not use illicit drugs. Allergies: No Known Allergies Medications Prior to Admission  Medication Sig Dispense Refill  . PRESCRIPTION MEDICATION Take 1 tablet by mouth daily. 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  Functional Status:  Mobility: Bed Mobility Bed Mobility: Rolling Right;Right Sidelying to Sit Rolling Right: 4: Min assist Right Sidelying to Sit: 3: Mod assist;With rails Supine to Sit: 1: +2 Total assist Supine to Sit: Patient Percentage: 60% Sitting - Scoot to Edge of Bed: 1: +1 Total assist Sit to Supine: 5: Supervision Sit to Supine: Patient Percentage: 80% Transfers Transfers: Sit to Stand;Stand to Sit Sit to Stand: 1: +2 Total assist Sit to Stand: Patient Percentage: 60% Stand to Sit: 1: +2 Total assist Stand to Sit: Patient Percentage: 70% Ambulation/Gait Ambulation/Gait Assistance: 1: +2 Total assist (+1 following with chair and 1 with IV pole) Ambulation/Gait: Patient Percentage: 70% Ambulation Distance (Feet): 90 Feet (and 70' seated rest in between) Assistive device: 2 person hand held assist Ambulation/Gait Assistance Details: leans to left and assist +2 for balance  Gait Pattern: Ataxic;Trunk rotated posteriorly on left;Decreased hip/knee flexion - left;Antalgic    ADL: ADL Eating/Feeding: Set up Where Assessed - Eating/Feeding: Chair Grooming: Performed;Wash/dry hands;Wash/dry face;Minimal assistance;Other (comment) (max VC to stay on task) Where Assessed - Grooming: Unsupported sitting Upper    Body Dressing: Maximal assistance Where Assessed - Upper Body Dressing: Unsupported sitting Lower Body Dressing: Performed;Maximal assistance Where Assessed - Lower Body Dressing: Supported sit to stand Transfers/Ambulation Related to ADLs: +2 total assist pt 70% bed <> chair ADL Comments: Evaluation limited due to multiple line and ventriculostomy.  Focus of session on early mobility.  Cognition: Cognition Overall Cognitive Status: Impaired/Different from baseline Arousal/Alertness: Awake/alert Orientation Level: Oriented to person;Disoriented to place;Disoriented to situation;Disoriented to time Attention: Focused;Sustained Focused Attention: Impaired Focused Attention Impairment: Verbal basic;Functional basic Sustained Attention: Impaired Sustained Attention Impairment: Verbal basic;Functional basic Memory: Impaired Memory Impairment: Storage deficit;Retrieval deficit;Decreased recall of new information;Decreased long term memory;Decreased short term memory Decreased Long Term Memory: Verbal basic;Verbal complex;Functional basic Decreased Short Term Memory: Verbal basic;Verbal complex;Functional basic Awareness: Impaired Awareness Impairment: Intellectual impairment;Emergent impairment;Anticipatory impairment Problem Solving: Impaired Problem Solving Impairment: Verbal basic;Functional basic Executive Function: Reasoning;Sequencing;Organizing;Decision Making;Self Monitoring;Initiating Reasoning: Impaired Reasoning Impairment: Verbal basic;Functional basic Sequencing: Impaired Sequencing Impairment: Verbal basic;Functional basic Organizing: Impaired Organizing Impairment: Verbal basic;Functional basic Decision Making: Impaired Decision Making Impairment: Verbal basic;Functional basic Initiating: Impaired Initiating Impairment: Verbal basic;Functional basic Self Monitoring: Appears intact Behaviors: Impulsive Safety/Judgment: Impaired Cognition Arousal/Alertness:  Awake/alert Behavior During Therapy: Impulsive Overall Cognitive Status: Impaired/Different from baseline Area of Impairment: Orientation;Attention;Memory;Safety/judgement;Problem solving Orientation Level: Place;Time;Situation Current Attention Level: Focused Memory: Decreased short-term memory Safety/Judgement: Decreased awareness of safety;Decreased awareness of deficits Problem Solving: Requires verbal cues;Requires tactile cues  Physical Exam: Blood pressure 177/77, pulse 58, temperature 97.7 F (36.5 C), temperature source Oral, resp. rate 18, height 6' 1" (1.854 m), weight 124.3 kg (274 lb 0.5 oz), SpO2 97.00%.    Constitutional: He appears well-developed. alert HENT:  Prior drain sites closed/staples. No drainage. Oral mucosa is pink and clear. Eyes: EOM are normal.  Neck: Neck supple. No JVD present. No tracheal deviation present. No thyromegaly present.  Cardiovascular: Normal rate and regular rhythm. No murmurs rubs or galloops Pulmonary/Chest: Breath sounds normal. No respiratory distress. No wheezes or rales or rhonchi Abdominal: Soft. Bowel sounds are normal. He exhibits no distension. There is no tenderness.  Musculoskeletal: He exhibits no edema.  Lymphadenopathy:  He has no cervical adenopathy.  Neurological: He is alert.  Patient was able to provide his name and date of birth. He stated he was in [place]. Patient with limited awareness of his deficits. He did follow simple commands. 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 He needed cues to maintain attention. Moved all 4's with 4 to 4+/5 strength with. No gross sensory deficits. A little impulsive. No gross limb ataxia. Senses pain and LT in all 4 limbs.   Results for orders placed during the hospital encounter of 12/03/12 (from the past 48 hour(s))  SODIUM     Status: None   Collection Time    12/14/12 12:00 PM      Result Value Range   Sodium 136  135 - 145 mEq/L  SODIUM     Status: None   Collection Time     12/14/12  6:00 PM      Result Value Range   Sodium 136  135 - 145 mEq/L  SODIUM     Status: None   Collection Time    12/15/12 12:00 AM      Result Value Range   Sodium 139  135 - 145 mEq/L  BASIC METABOLIC PANEL     Status: Abnormal   Collection Time    12/15/12  5:20 AM      Result Value Range     Sodium 137  135 - 145 mEq/L   Potassium 4.2  3.5 - 5.1 mEq/L   Comment: HEMOLYSIS AT THIS LEVEL MAY AFFECT RESULT     DELTA CHECK NOTED   Chloride 104  96 - 112 mEq/L   CO2 25  19 - 32 mEq/L   Glucose, Bld 105 (*) 70 - 99 mg/dL   BUN 14  6 - 23 mg/dL   Creatinine, Ser 0.44 (*) 0.50 - 1.35 mg/dL   Calcium 9.7  8.4 - 10.5 mg/dL   GFR calc non Af Amer >90  >90 mL/min   GFR calc Af Amer >90  >90 mL/min   Comment:            The eGFR has been calculated     using the CKD EPI equation.     This calculation has not been     validated in all clinical     situations.     eGFR's persistently     <90 mL/min signify     possible Chronic Kidney Disease.  SODIUM     Status: None   Collection Time    12/15/12  3:04 PM      Result Value Range   Sodium 135  135 - 145 mEq/L  SODIUM     Status: None   Collection Time    12/15/12  9:17 PM      Result Value Range   Sodium 135  135 - 145 mEq/L  SODIUM     Status: Abnormal   Collection Time    12/16/12 12:01 AM      Result Value Range   Sodium 134 (*) 135 - 145 mEq/L  SODIUM     Status: None   Collection Time    12/16/12  5:30 AM      Result Value Range   Sodium 135  135 - 145 mEq/L   No results found.  Post Admission Physician Evaluation: 1. Functional deficits secondary  to right SAH due to ACOM aneurysm 2. Patient is admitted to receive collaborative, interdisciplinary care between the physiatrist, rehab nursing staff, and therapy team. 3. Patient's level of medical complexity and substantial therapy needs in context of that medical necessity cannot be provided at a lesser intensity of care such as a SNF. 4. Patient has  experienced substantial functional loss from his/her baseline which was documented above under the "Functional History" and "Functional Status" headings.  Judging by the patient's diagnosis, physical exam, and functional history, the patient has potential for functional progress which will result in measurable gains while on inpatient rehab.  These gains will be of substantial and practical use upon discharge  in facilitating mobility and self-care at the household level. 5. Physiatrist will provide 24 hour management of medical needs as well as oversight of the therapy plan/treatment and provide guidance as appropriate regarding the interaction of the two. 6. 24 hour rehab nursing will assist with bladder management, bowel management, safety, skin/wound care, disease management, medication administration, pain management and patient education  and help integrate therapy concepts, techniques,education, etc. 7. PT will assess and treat for/with: Lower extremity strength, range of motion, stamina, balance, functional mobility, safety, adaptive techniques and equipment, NMR, visual and cognitive perceptual rx.   Goals are: supervision. 8. OT will assess and treat for/with: ADL's, functional mobility, safety, upper extremity strength, adaptive techniques and equipment, NMR, cognitive and visual perceptual rx.   Goals are: supervision. 9. SLP will assess and treat for/with: cognition, communication.  Goals   are: supervision. 10. Case Management and Social Worker will assess and treat for psychological issues and discharge planning. 11. Team conference will be held weekly to assess progress toward goals and to determine barriers to discharge. 12. Patient will receive at least 3 hours of therapy per day at least 5 days per week. 13. ELOS: 2 to 2.5 weeks      Prognosis:  excellent   Medical Problem List and Plan: 1. Right SAH due to ACOM aneurysm. Status post coiling and IVC drain 2. DVT  Prophylaxis/Anticoagulation: SCDs. Monitor for any signs of DVT 3. Pain Management: Oxycodone as needed. Monitor with increased mobility 4. Mood/delirium. Bed alarm for safety. Followup speech therapy for cognition  -showing improvement but has poor judgement and insight.  -is a fall risk----fall safety planning 5. Neuropsych: This patient is capable of making decisions on his/her own behalf. 6. Hyponatremia. Resolved. Taper off Declomycin and patient remains on sodium chloride tablets. Followup chemistries during rehab stay. Wean off Na+ tabs? 7. Hypertension. Discuss plan to taper nimodipine with IR           Cullan Launer T. Ishi Danser, MD, FAAPMR [facility]   12/16/2012 

## 2012-12-17 ENCOUNTER — Inpatient Hospital Stay (HOSPITAL_COMMUNITY)
Admission: RE | Admit: 2012-12-17 | Discharge: 2013-01-01 | DRG: 945 | Disposition: A | Discharge status: Home / Self Care | Payer: No Typology Code available for payment source | Source: Intra-hospital | Attending: Physical Medicine & Rehabilitation | Admitting: Physical Medicine & Rehabilitation

## 2012-12-17 DIAGNOSIS — E871 Hypo-osmolality and hyponatremia: Secondary | ICD-10-CM | POA: Diagnosis present

## 2012-12-17 DIAGNOSIS — I609 Nontraumatic subarachnoid hemorrhage, unspecified: Secondary | ICD-10-CM

## 2012-12-17 DIAGNOSIS — I10 Essential (primary) hypertension: Secondary | ICD-10-CM

## 2012-12-17 DIAGNOSIS — I824Y9 Acute embolism and thrombosis of unspecified deep veins of unspecified proximal lower extremity: Secondary | ICD-10-CM | POA: Diagnosis present

## 2012-12-17 DIAGNOSIS — F05 Delirium due to known physiological condition: Secondary | ICD-10-CM | POA: Diagnosis present

## 2012-12-17 DIAGNOSIS — Z5189 Encounter for other specified aftercare: Principal | ICD-10-CM

## 2012-12-17 LAB — SODIUM
Sodium: 138 meq/L (ref 135–145)
Sodium: 138 mEq/L (ref 135–145)
Sodium: 132 meq/L — ABNORMAL LOW (ref 135–145)

## 2012-12-17 MED ORDER — SODIUM CHLORIDE 1 G PO TABS
1.0000 g | ORAL_TABLET | Freq: Two times a day (BID) | ORAL | Status: DC
  Administered 2012-12-18 – 2012-12-19 (×4): 1 g via ORAL
  Filled 2012-12-17 (×8): qty 1

## 2012-12-17 MED ORDER — ONDANSETRON HCL 4 MG/2ML IJ SOLN: 4.0000 mg | Freq: Four times a day (QID) | INTRAMUSCULAR | Status: DC | PRN

## 2012-12-17 MED ORDER — ACETAMINOPHEN 325 MG PO TABS
325.0000 mg | ORAL_TABLET | ORAL | Status: DC | PRN
  Administered 2012-12-18 – 2012-12-30 (×13): 650 mg via ORAL
  Filled 2012-12-17 (×14): qty 2

## 2012-12-17 MED ORDER — OXYCODONE HCL 5 MG PO TABS
10.0000 mg | ORAL_TABLET | ORAL | Status: DC | PRN
  Administered 2012-12-17 – 2012-12-21 (×8): 10 mg via ORAL
  Filled 2012-12-17 (×9): qty 2

## 2012-12-17 MED ORDER — BOOST / RESOURCE BREEZE PO LIQD
1.0000 | Freq: Three times a day (TID) | ORAL | Status: DC
  Administered 2012-12-17 – 2012-12-19 (×6): 1 via ORAL

## 2012-12-17 MED ORDER — NIMODIPINE 60 MG/20ML PO SOLN
60.0000 mg | ORAL | Status: DC
  Administered 2012-12-17 – 2012-12-23 (×34): 60 mg via ORAL
  Filled 2012-12-17 (×40): qty 20

## 2012-12-17 MED ORDER — DEMECLOCYCLINE HCL 150 MG PO TABS
300.0000 mg | ORAL_TABLET | Freq: Four times a day (QID) | ORAL | Status: DC
  Administered 2012-12-17 – 2012-12-20 (×10): 300 mg via ORAL
  Filled 2012-12-17 (×15): qty 2

## 2012-12-17 MED ORDER — WHITE PETROLATUM GEL
Status: AC
  Administered 2012-12-17: 0.2
  Filled 2012-12-17: qty 5

## 2012-12-17 MED ORDER — ONDANSETRON HCL 4 MG PO TABS: 4.0000 mg | ORAL_TABLET | Freq: Four times a day (QID) | ORAL | Status: DC | PRN

## 2012-12-17 MED ORDER — SORBITOL 70 % SOLN: 30.0000 mL | Freq: Every day | Status: DC | PRN

## 2012-12-17 MED ORDER — SENNOSIDES-DOCUSATE SODIUM 8.6-50 MG PO TABS
1.0000 | ORAL_TABLET | Freq: Two times a day (BID) | ORAL | Status: DC
  Administered 2012-12-17 – 2012-12-18 (×2): 1 via ORAL
  Filled 2012-12-17 (×2): qty 1

## 2012-12-17 NOTE — Progress Notes (Signed)
Rehab admissions - I have approval for acute inpatient rehab admission.  I have talked with his brother and sister today.  All in agreement to inpatient rehab prior to home with 24 hr supervision.  Call me for questions.  #[phone]

## 2012-12-17 NOTE — PMR Pre-admission (Signed)
PMR Admission Coordinator Pre-Admission Assessment  Patient: Dennis Wyatt is an 52 y.o., male MRN: [id number] DOB: 1961-07-05 Height: 6\' 1"  (185.4 cm) Weight: 124.3 kg (274 lb 0.5 oz)              Insurance Information HMO:      PPO:       PCP:       IPA:       80/20:       OTHER:   PRIMARYTedd Sias      Policy#: [id number]      Subscriber: Jeannette How CM Name: Tilford Pillar      Phone#: [phone]     Fax#: [phone] Pre-Cert#: [id number] Update after initial clinicals.  Auth good for 1 week.      Employer:  [employer] Benefits:  Phone #:  [phone]     Name: Jola Baptist. Date:  05/14/12     Deduct: $3500(met)      Out of Pocket Max: $5000(met $740.45)      Life Max: Unlimited CIR: $250 copay then 70% up to 30 days      SNF: $250 copay then 70% up to 100 days Outpatient: 50% up to 20 visits     Co-Pay: 50% Home Health: 70% up to 30 visits      Co-Pay: 30% DME: 70%     Co-Pay: 30% Providers: in network  Emergency Contact Information Contact Information   Name Relation Home Work Mobile   [place] Brother   [phone]   Spiker,Crystal Niece   [phone]   Tracie, Lindbloom   [phone]     Current Medical History  Patient Admitting Diagnosis:  R SAH due to ACOM aneurysm  History of Present Illness:  A 52 y.o. right-handed male with history of hypertension. Admitted 12/03/2012 with headache and dizziness. Blood pressure noted to be 204/123. Cranial CT scan showed subarachnoid hemorrhage with concern for ruptured aneurysm. Four-vessel cerebral arteriogram showed 11 mm x 7 mm irregular right ACA A1 aneurysm. Interventional radiology consulted and patient underwent coiling of aneurysm, IVC drain. Patient did remain intubated for a time followed by critical care medicine. Latest followup cranial CT scan 12/13/2012 stable and external ventricular drain has been removed. Close monitoring of blood pressure as patient remained on nimodipine x21 day  protocol and await plan to taper . He is tolerating a regular consistency diet. Patient with bouts of hyponatremia 115 and placed on Declomycin as well as sodium chloride tablets with latest followup sodium level 135 on 12/16/2012. Physical and occupational therapy evaluations completed with noted bouts of confusion. Recommendations have been made for physical medicine rehabilitation consult to consider inpatient rehabilitation services. Patient was felt to be a good candidate for inpatient rehabilitation services and was admitted for a comprehensive rehabilitation program      Total: 0=NIH  Past Medical History  Past Medical History  Diagnosis Date  . Hypertension     Family History  family history is not on file.  Prior Rehab/Hospitalizations: Had outpatient therapy as a 52 yo after a broken hip.   Current Medications  Current facility-administered medications:acetaminophen (TYLENOL) tablet 650 mg, 650 mg, Oral, Q4H PRN, Leslye Peer, MD, 650 mg at 12/09/12 0900;  demeclocycline (DECLOMYCIN) tablet 300 mg, 300 mg, Oral, Q6H, Hewitt Shorts, MD, 300 mg at 12/17/12 0109;  feeding supplement (RESOURCE BREEZE) liquid 1 Container, 1 Container, Oral, TID BM, Heather Cornelison Pitts, RD, 1 Container at 12/17/12 0947 labetalol (NORMODYNE,TRANDATE) injection 10-40 mg, 10-40  mg, Intravenous, Q10 min PRN, Leslye Peer, MD, 20 mg at 12/06/12 0358;  NiMODipine (NYMALIZE) 60 MG/20ML oral solution 60 mg, 60 mg, Oral, Q4H, Carmela Hurt, MD, 60 mg at 12/17/12 0938;  ondansetron (ZOFRAN) injection 4 mg, 4 mg, Intravenous, Q6H PRN, Carmela Hurt, MD, 4 mg at 12/05/12 1819 oxyCODONE (Oxy IR/ROXICODONE) immediate release tablet 10 mg, 10 mg, Oral, Q4H PRN, Leslye Peer, MD, 10 mg at 12/17/12 0758;  senna-docusate (Senokot-S) tablet 1 tablet, 1 tablet, Oral, BID, Carmela Hurt, MD, 1 tablet at 12/17/12 1610;  sodium chloride tablet 1 g, 1 g, Oral, BID WC, Carmela Hurt, MD, 1 g at 12/17/12  9604  Patients Current Diet: General  Precautions / Restrictions Precautions Precautions: Fall Precaution Comments: Impulsive, confused Restrictions Weight Bearing Restrictions: No   Prior Activity Level Community (5-7x/wk): Went out daily.  worked [employer] as a Naval architect.  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  Current Functional Level Cognition  Arousal/Alertness: Awake/alert Overall Cognitive Status: Impaired/Different from baseline Current Attention Level: Focused Memory: Decreased short-term memory Orientation Level: Oriented to person;Oriented to place;Oriented to time;Oriented to situation Safety/Judgement: Decreased awareness of safety;Decreased awareness of deficits Attention: Focused;Sustained Focused Attention: Impaired Focused Attention Impairment: Verbal basic;Functional basic Sustained Attention: Impaired Sustained Attention Impairment: Verbal basic;Functional basic Memory: Impaired Memory Impairment: Storage deficit;Retrieval deficit;Decreased recall of new information;Decreased long term memory;Decreased short term memory Decreased Long Term Memory: Verbal basic;Verbal complex;Functional basic Decreased Short Term Memory: Verbal basic;Verbal complex;Functional basic Awareness: Impaired Awareness Impairment: Intellectual impairment;Emergent impairment;Anticipatory impairment Problem Solving: Impaired Problem Solving Impairment: Verbal basic;Functional basic Executive Function: Reasoning;Sequencing;Organizing;Decision Making;Self Monitoring;Initiating Reasoning: Impaired Reasoning Impairment: Verbal basic;Functional basic Sequencing: Impaired Sequencing Impairment: Verbal basic;Functional basic Organizing:  Impaired Organizing Impairment: Verbal basic;Functional basic Decision Making: Impaired Decision Making Impairment: Verbal basic;Functional basic Initiating: Impaired Initiating Impairment: Verbal basic;Functional basic Self Monitoring: Appears intact Behaviors: Impulsive Safety/Judgment: Impaired    Extremity Assessment (includes Sensation/Coordination)  RUE ROM/Strength/Tone: WFL for tasks assessed;Unable to fully assess (due to lines)  RLE ROM/Strength/Tone: Deficits RLE ROM/Strength/Tone Deficits: ankle 3+/5, knee 4/5, hip 4/5    ADLs  Eating/Feeding: Set up Where Assessed - Eating/Feeding: Chair Grooming: Performed;Wash/dry hands;Wash/dry face;Minimal assistance;Other (comment) (max VC to stay on task) Where Assessed - Grooming: Unsupported sitting Upper Body Dressing: Maximal assistance Where Assessed - Upper Body Dressing: Unsupported sitting Lower Body Dressing: Performed;Maximal assistance Where Assessed - Lower Body Dressing: Supported sit to stand Transfers/Ambulation Related to ADLs: +2 total assist pt 70% bed <> chair ADL Comments: Evaluation limited due to multiple line and ventriculostomy.  Focus of session on early mobility.    Mobility  Bed Mobility: Rolling Right;Right Sidelying to Sit Rolling Right: 4: Min assist;With rail Right Sidelying to Sit: 3: Mod assist;With rails Supine to Sit: 1: +2 Total assist Supine to Sit: Patient Percentage: 60% Sitting - Scoot to Edge of Bed: 1: +1 Total assist Sit to Supine: 4: Min assist Sit to Supine: Patient Percentage: 80%    Transfers  Transfers: Sit to Stand;Stand to Sit Sit to Stand: 1: +2 Total assist;From chair/3-in-1;From bed Sit to Stand: Patient Percentage: 50% Stand to Sit: 1: +2 Total assist;To chair/3-in-1;To bed Stand to Sit: Patient Percentage: 50%    Ambulation / Gait / Stairs / Wheelchair Mobility  Ambulation/Gait Ambulation/Gait Assistance: 1: +2 Total assist  Ambulation/Gait: Patient Percentage:  60% Ambulation Distance (Feet): 4 Feet Assistive device: 2 person hand held assist Ambulation/Gait Assistance Details: in room only due to LE weakness and giving way with increasd UE support needed.  Pt transferred sit>stand x 4, then stand turn to [facility], then took several steps after standing from [facility] to move away then back towards bed due to LE weakness. Gait Pattern: Shuffle;Ataxic;Narrow base of support;Right flexed knee in stance;Left flexed knee in stance    Posture / Balance Static Sitting Balance Static Sitting - Balance Support: Feet supported;Bilateral upper extremity supported Static Sitting - Level of Assistance: 4: Min assist;3: Mod assist Static Sitting - Comment/# of Minutes: Pt fearful at edge of bed as if feels he is falling,  Cues for reassurance and minguard assist throughout, but pt leans posterior when feels he is falling and needs mod support to return to upright Static Standing Balance Static Standing - Balance Support: Bilateral upper extremity supported Static Standing - Level of Assistance: 1: +2 Total assist Static Standing - Comment/# of Minutes: after ambulation with walker too far forward and anterior bias with impulsivity, took hands off walker in room to adjust IV and ECG lines and pt able to stand with minguard assist.    Special needs/care consideration BiPAP/CPAP No CPM No Continuous Drip IV No Dialysis No        Life Vest No Oxygen No Special Bed No Trach Size No Wound Vac (area) No     Skin NO                              Bowel mgmt: Had loose BM on 12/16/12 Bladder mgmt: Incontinent of urine at time Diabetic mgmt No    Previous Home Environment Living Arrangements: Alone Lives With: Alone Available Help at Discharge: Family;Available 24 hours/day Type of Home: House Home Layout: One level Home Access: Stairs to enter [employer] of Steps: 2 Home Care Services: No  Discharge Living Setting Plans for Discharge Living Setting:  House;Lives with (comment) (Brother plans for patient to come to his house.) Type of Home at Discharge: House Discharge Home Layout: One level Discharge Home Access: Stairs to enter Entrance Stairs-Number of Steps: 5 at front and 4 at back entry Do you have any problems obtaining your medications?: No  Social/Family/Support Systems Patient Roles: Other (Comment) (Has a brother and a sister.) Contact Information: Aj Crunkleton - brother - [phone] Anticipated Caregiver: Orlie Dakin and brother and family Anticipated Caregiver's Contact Information: Jake Fuhrmann - niece [phone] Ability/Limitations of Caregiver: Niece not currently working and can assist. Caregiver Availability: 24/7 Discharge Plan Discussed with Primary Caregiver: Yes Is Caregiver In Agreement with Plan?: Yes Does Caregiver/Family have Issues with Lodging/Transportation while Pt is in Rehab?: No  Goals/Additional Needs Patient/Family Goal for Rehab: PT/OT Supervision goals, ST mod I/S goals Expected length of stay: 1-2 weeks Cultural Considerations: None Dietary Needs: Regular Equipment Needs: TBD Pt/Family Agrees to Admission and willing to participate: Yes Program Orientation Provided & Reviewed with Pt/Caregiver Including Roles  & Responsibilities: Yes   Decrease burden of Care through IP rehab admission:  Not applicable  Possible need for SNF placement upon discharge: Not likely   Patient Condition: This patient's medical and functional status has changed since the consult dated: 12/08/12 in which the Rehabilitation Physician determined and documented that the patient's condition is appropriate for intensive rehabilitative care in an inpatient rehabilitation facility. See "History of Present Illness" (above) for  medical update. Functional changes are: Currently requiring total assist +2 60% amb 4 ft with 2 person HHA.   Patient's medical and functional status update has been discussed with the Rehabilitation  physician and patient remains appropriate for inpatient rehabilitation. Will admit to inpatient rehab today.  Preadmission Screen Completed By:  Trish Mage, 12/17/2012 11:30 AM ______________________________________________________________________   Discussed status with Dr. Riley Kill on 12/17/12 at 1222 and received telephone approval for admission today.  Admission Coordinator:  Trish Mage, time1222/Date06/06/14

## 2012-12-17 NOTE — Discharge Summary (Signed)
Physician Discharge Summary  Patient ID: Dennis Wyatt MRN: [id number] DOB/AGE: 1961-05-24 52 y.o.  Admit date: 12/03/2012 Discharge date: 12/17/2012  Admission Diagnoses:Subarachnoid hemorrhage  Discharge Diagnoses:  Principal Problem:   Subarachnoid hemorrhage from anterior communicating artery aneurysm Active Problems:   Acute respiratory failure   Malignant hypertension   Altered mental status   Anterior communicating artery aneurysm   Hyperglycemia   Hyponatremia   Discharged Condition: good  Hospital Course: Dennis Wyatt was admitted to the hospital due to a subarachnoid hemorrhage. He had a ventricular catheter placed due intraventricular blood. He did have an Anterior communicating artery aneurysm which ruptured. This was treated via an endovasular approach. Post coiling he has done well, though he remains confused. Repeat Ct's do not show hydrocephalus, and his ventricular catheter has been out for a few days. He moves all extremities well, speech is clear and fluent. He is oriented to person, and the fact he is in a hospital. Not oriented to year, or situation  Consults: pulmonary/intensive care and rehabilitation medicine  Significant Diagnostic Studies: angiography: cerebral  Treatments: Endovascular coiling  Discharge Exam: Blood pressure 154/81, pulse 63, temperature 97.9 F (36.6 C), temperature source Oral, resp. rate 22, height 6\' 1"  (1.854 m), weight 124.3 kg (274 lb 0.5 oz), SpO2 96.00%. as above  Disposition: Final discharge disposition not confirmed   Future Appointments Provider Department Dept Phone   12/18/2012 10:00 AM Ophelia Shoulder, CCC-SLP [facility]  [address]  [place] [phone]   12/18/2012 1:00 PM Karrie Meres, PT [facility]  [address]  [place] [phone]   12/18/2012 3:30 PM Karrie Meres, PT [facility]  [address]  REHAB [phone]       Medication List    TAKE these  medications       PRESCRIPTION MEDICATION  Take 1 tablet by mouth daily. Blood Pressure           Follow-up Information   Follow up with Gisell Buehrle L, MD In 1 month.   Contact information:   [address]                         [address] [place] [address] [phone]       Signed: Vinson Tietze L 12/17/2012, 1:23 PM

## 2012-12-17 NOTE — Progress Notes (Signed)
Occupational Therapy Treatment Patient Details Name: Dennis Wyatt MRN: [id number] DOB: Apr 28, 1961 Today's Date: 12/17/2012 Time: 9629-5284 OT Time Calculation (min): 21 min  OT Assessment / Plan / Recommendation Comments on Treatment Session Pt continues to make progress toward goals. Would greatly benefit from CIR.    Follow Up Recommendations  CIR    Barriers to Discharge       Equipment Recommendations  3 in 1 bedside comode    Recommendations for Other Services Rehab consult  Frequency Min 3X/week   Plan Discharge plan remains appropriate    Precautions / Restrictions Precautions Precautions: Fall Precaution Comments: Impulsive, confused   Pertinent Vitals/Pain See vitals    ADL  Grooming: Performed;Wash/dry hands;Set up Where Assessed - Grooming: Supported sitting Toilet Transfer: Chief of Staff: Patient Percentage: 70% Statistician Method: Surveyor, minerals:  (bed<>chair) Toileting - Architect and Hygiene: Performed;+1 Total assistance Where Assessed - Glass blower/designer Manipulation and Hygiene: Rolling right and/or left Equipment Used: Gait belt Transfers/Ambulation Related to ADLs: +2 total assist pt+70% bed<>chair. ADL Comments: Pt on bedpan upon OT/PT arrival.  Rolled L/R multiple times in order for therapist to remove bed pan and assist wtih peri care. Max verbal and visual cueing to use environmental cues for orienting to time/place (calendar, clock).      OT Diagnosis:    OT Problem List:   OT Treatment Interventions:     OT Goals ADL Goals Pt Will Perform Grooming: with supervision;Standing at sink ADL Goal: Grooming - Progress: Progressing toward goals Pt Will Transfer to Toilet: with supervision;Ambulation ADL Goal: Toilet Transfer - Progress: Progressing toward goals Miscellaneous OT Goals Miscellaneous OT Goal #1: Pt will use environmental cues for orientation to place and  time. OT Goal: Miscellaneous Goal #1 - Progress: Progressing toward goals Miscellaneous OT Goal #2: Pt will request assist for OOB/OOC mobility with minimal reminders. OT Goal: Miscellaneous Goal #2 - Progress: Progressing toward goals  Visit Information  Last OT Received On: 12/17/12 Assistance Needed: +2 PT/OT Co-Evaluation/Treatment: Yes    Subjective Data      Prior Functioning       Cognition  Cognition Arousal/Alertness: Awake/alert Behavior During Therapy: Impulsive Overall Cognitive Status: Impaired/Different from baseline Area of Impairment: Orientation;Attention;Memory;Safety/judgement;Problem solving Orientation Level: Time Current Attention Level: Focused Memory: Decreased short-term memory Safety/Judgement: Decreased awareness of safety;Decreased awareness of deficits Problem Solving: Requires verbal cues;Requires tactile cues General Comments: Educated pt on using call button to call RN to assist with mobility but pt confused why he needed help from nursing staff ("The bathroom is right there, I can do it.") despite fact that pt needed +2 help from bed<>chair.    Mobility  Bed Mobility Bed Mobility: Rolling Right;Rolling Left;Right Sidelying to Sit;Sitting - Scoot to [place] of Bed Rolling Right: 4: Min assist Rolling Left: 4: Min assist Right Sidelying to Sit: 3: Mod assist Sitting - Scoot to Edge of Bed: 4: Min assist Details for Bed Mobility Assistance: Assist to elevate trunk OOB to come up into sitting position. Transfers Transfers: Sit to Stand;Stand to Sit Sit to Stand: 1: +2 Total assist;From bed;With upper extremity assist Sit to Stand: Patient Percentage: 70% Stand to Sit: 1: +2 Total assist;To chair/3-in-1;With upper extremity assist;With armrests Stand to Sit: Patient Percentage: 70% Details for Transfer Assistance: Assist for power up from lower surface of  bed. Multimodal cues for safe placement of feet and hands.    Exercises      Balance  End of Session OT - End of Session Equipment Utilized During Treatment: Gait belt Activity Tolerance: Patient tolerated treatment well Patient left: in chair;with call bell/phone within reach;with chair alarm set Nurse Communication: Mobility status  GO    12/17/2012 Cipriano Mile OTR/L Pager [phone] Office [phone]  Cipriano Mile 12/17/2012, 3:14 PM

## 2012-12-17 NOTE — Progress Notes (Signed)
Rehab admissions - I spoke with insurance case Production designer, theatre/television/film, [employer].  We hope to have an answer from the insurance medical director by noon today regarding possible inpatient rehab admission.  I will update all when I hear back from insurance case manager.  Call me for questions.  [phone]

## 2012-12-17 NOTE — H&P (View-Only) (Signed)
Physical Medicine and Rehabilitation Admission H&P    Chief Complaint  Patient presents with  . Loss of Consciousness  : HPI: Dennis Wyatt is a 52 y.o. right-handed male with history of hypertension. Admitted 12/03/2012 with headache and dizziness. Blood pressure noted to be 204/123. Cranial CT scan showed subarachnoid hemorrhage with concern for ruptured aneurysm. Four-vessel cerebral arteriogram showed 11 mm x 7 mm irregular right ACA A1 aneurysm. Interventional radiology consulted and patient underwent coiling of aneurysm, IVC drain. Patient did remain intubated for a time followed by critical care medicine. Latest followup cranial CT scan 12/13/2012 stable and external ventricular drain has been removed. Close monitoring of blood pressure as patient remained on nimodipine x21 day protocol and await plan to taper . He is tolerating a regular consistency diet. Patient with bouts of hyponatremia 115 and placed on Declomycin as well as sodium chloride tablets with latest followup sodium level 135 on 12/16/2012. Physical and occupational therapy evaluations completed with noted bouts of confusion. Recommendations have been made for physical medicine rehabilitation consult to consider inpatient rehabilitation services. Patient was felt to be a good candidate for inpatient rehabilitation services and was admitted for a comprehensive rehabilitation program  Review of Systems  Eyes: Positive for photophobia.  Neurological: Positive for dizziness and headaches.  All other systems reviewed and are negative   Past Medical History  Diagnosis Date  . Hypertension    Past Surgical History  Procedure Laterality Date  . Radiology with anesthesia N/A 12/03/2012    Procedure: RADIOLOGY WITH ANESTHESIA;  Surgeon: Oneal Grout, MD;  Location: [facility] OR;  Service: Radiology;  Laterality: N/A;   History reviewed. No pertinent family history. Social History:  reports that he has been smoking.  He does not  have any smokeless tobacco history on file. He reports that  drinks alcohol. He reports that he does not use illicit drugs. Allergies: No Known Allergies Medications Prior to Admission  Medication Sig Dispense Refill  . PRESCRIPTION MEDICATION Take 1 tablet by mouth daily. 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, commercial Status:  Mobility: Bed Mobility Bed Mobility: Rolling Right;Right Sidelying to Sit Rolling Right: 4: Min assist Right Sidelying to Sit: 3: Mod assist;With rails Supine to Sit: 1: +2 Total assist Supine to Sit: Patient Percentage: 60% Sitting - Scoot to Edge of Bed: 1: +1 Total assist Sit to Supine: 5: Supervision Sit to Supine: Patient Percentage: 80% Transfers Transfers: Sit to Stand;Stand to Sit Sit to Stand: 1: +2 Total assist Sit to Stand: Patient Percentage: 60% Stand to Sit: 1: +2 Total assist Stand to Sit: Patient Percentage: 70% Ambulation/Gait Ambulation/Gait Assistance: 1: +2 Total assist (+1 following with chair and 1 with IV pole) Ambulation/Gait: Patient Percentage: 70% Ambulation Distance (Feet): 90 Feet (and 70' seated rest in between) Assistive device: 2 person hand held assist Ambulation/Gait Assistance Details: leans to left and assist +2 for balance  Gait Pattern: Ataxic;Trunk rotated posteriorly on left;Decreased hip/knee flexion - left;Antalgic    ADL: ADL Eating/Feeding: Set up Where Assessed - Eating/Feeding: Chair Grooming: Performed;Wash/dry hands;Wash/dry face;Minimal assistance;Other (comment) (max VC to stay on task) Where Assessed - Grooming: Unsupported sitting Upper  Body Dressing: Maximal assistance Where Assessed - Upper Body Dressing: Unsupported sitting Lower Body Dressing: Performed;Maximal assistance Where Assessed - Lower Body Dressing: Supported sit to stand Transfers/Ambulation Related to ADLs: +2 total assist pt 70% bed <> chair ADL Comments: Evaluation limited due to multiple line and ventriculostomy.  Focus of session on early mobility.  Cognition: Cognition Overall Cognitive Status: Impaired/Different from baseline Arousal/Alertness: Awake/alert Orientation Level: Oriented to person;Disoriented to place;Disoriented to situation;Disoriented to time Attention: Focused;Sustained Focused Attention: Impaired Focused Attention Impairment: Verbal basic;Functional basic Sustained Attention: Impaired Sustained Attention Impairment: Verbal basic;Functional basic Memory: Impaired Memory Impairment: Storage deficit;Retrieval deficit;Decreased recall of new information;Decreased long term memory;Decreased short term memory Decreased Long Term Memory: Verbal basic;Verbal complex;Functional basic Decreased Short Term Memory: Verbal basic;Verbal complex;Functional basic Awareness: Impaired Awareness Impairment: Intellectual impairment;Emergent impairment;Anticipatory impairment Problem Solving: Impaired Problem Solving Impairment: Verbal basic;Functional basic Executive Function: Reasoning;Sequencing;Organizing;Decision Making;Self Monitoring;Initiating Reasoning: Impaired Reasoning Impairment: Verbal basic;Functional basic Sequencing: Impaired Sequencing Impairment: Verbal basic;Functional basic Organizing: Impaired Organizing Impairment: Verbal basic;Functional basic Decision Making: Impaired Decision Making Impairment: Verbal basic;Functional basic Initiating: Impaired Initiating Impairment: Verbal basic;Functional basic Self Monitoring: Appears intact Behaviors: Impulsive Safety/Judgment: Impaired Cognition Arousal/Alertness:  Awake/alert Behavior During Therapy: Impulsive Overall Cognitive Status: Impaired/Different from baseline Area of Impairment: Orientation;Attention;Memory;Safety/judgement;Problem solving Orientation Level: Place;Time;Situation Current Attention Level: Focused Memory: Decreased short-term memory Safety/Judgement: Decreased awareness of safety;Decreased awareness of deficits Problem Solving: Requires verbal cues;Requires tactile cues  Physical Exam: Blood pressure 177/77, pulse 58, temperature 97.7 F (36.5 C), temperature source Oral, resp. rate 18, height 6\' 1"  (1.854 m), weight 124.3 kg (274 lb 0.5 oz), SpO2 97.00%.    Constitutional: He appears well-developed. alert HENT:  Prior drain sites closed/staples. No drainage. Oral mucosa is pink and clear. Eyes: EOM are normal.  Neck: Neck supple. No JVD present. No tracheal deviation present. No thyromegaly present.  Cardiovascular: Normal rate and regular rhythm. No murmurs rubs or galloops Pulmonary/Chest: Breath sounds normal. No respiratory distress. No wheezes or rales or rhonchi Abdominal: Soft. Bowel sounds are normal. He exhibits no distension. There is no tenderness.  Musculoskeletal: He exhibits no edema.  Lymphadenopathy:  He has no cervical adenopathy.  Neurological: He is alert.  Patient was able to provide his name and date of birth. He stated he was in [place]. Patient with limited awareness of his deficits. He did follow simple commands. 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 He needed cues to maintain attention. Moved all 4's with 4 to 4+/5 strength with. No gross sensory deficits. A little impulsive. No gross limb ataxia. Senses pain and LT in all 4 limbs.   Results for orders placed during the hospital encounter of 12/03/12 (from the past 48 hour(s))  SODIUM     Status: None   Collection Time    12/14/12 12:00 PM      Result Value Range   Sodium 136  135 - 145 mEq/L  SODIUM     Status: None   Collection Time     12/14/12  6:00 PM      Result Value Range   Sodium 136  135 - 145 mEq/L  SODIUM     Status: None   Collection Time    12/15/12 12:00 AM      Result Value Range   Sodium 139  135 - 145 mEq/L  BASIC METABOLIC PANEL     Status: Abnormal   Collection Time    12/15/12  5:20 AM      Result Value Range  Sodium 137  135 - 145 mEq/L   Potassium 4.2  3.5 - 5.1 mEq/L   Comment: HEMOLYSIS AT THIS LEVEL MAY AFFECT RESULT     DELTA CHECK NOTED   Chloride 104  96 - 112 mEq/L   CO2 25  19 - 32 mEq/L   Glucose, Bld 105 (*) 70 - 99 mg/dL   BUN 14  6 - 23 mg/dL   Creatinine, Ser 9.81 (*) 0.50 - 1.35 mg/dL   Calcium 9.7  8.4 - 19.1 mg/dL   GFR calc non Af Amer >90  >90 mL/min   GFR calc Af Amer >90  >90 mL/min   Comment:            The eGFR has been calculated     using the CKD EPI equation.     This calculation has not been     validated in all clinical     situations.     eGFR's persistently     <90 mL/min signify     possible Chronic Kidney Disease.  SODIUM     Status: None   Collection Time    12/15/12  3:04 PM      Result Value Range   Sodium 135  135 - 145 mEq/L  SODIUM     Status: None   Collection Time    12/15/12  9:17 PM      Result Value Range   Sodium 135  135 - 145 mEq/L  SODIUM     Status: Abnormal   Collection Time    12/16/12 12:01 AM      Result Value Range   Sodium 134 (*) 135 - 145 mEq/L  SODIUM     Status: None   Collection Time    12/16/12  5:30 AM      Result Value Range   Sodium 135  135 - 145 mEq/L   No results found.  Post Admission Physician Evaluation: 1. Functional deficits secondary  to right SAH due to [facility] aneurysm 2. Patient is admitted to receive collaborative, interdisciplinary care between the physiatrist, rehab nursing staff, and therapy team. 3. Patient's level of medical complexity and substantial therapy needs in context of that medical necessity cannot be provided at a lesser intensity of care such as a SNF. 4. Patient has  experienced substantial functional loss from his/her baseline which was documented above under the "Functional History" and "Functional Status" headings.  Judging by the patient's diagnosis, physical exam, and functional history, the patient has potential for functional progress which will result in measurable gains while on inpatient rehab.  These gains will be of substantial and practical use upon discharge  in facilitating mobility and self-care at the household level. 5. Physiatrist will provide 24 hour management of medical needs as well as oversight of the therapy plan/treatment and provide guidance as appropriate regarding the interaction of the two. 6. 24 hour rehab nursing will assist with bladder management, bowel management, safety, skin/wound care, disease management, medication administration, pain management and patient education  and help integrate therapy concepts, techniques,education, etc. 7. PT will assess and treat for/with: Lower extremity strength, range of motion, stamina, balance, functional mobility, safety, adaptive techniques and equipment, NMR, visual and cognitive perceptual rx.   Goals are: supervision. 8. OT will assess and treat for/with: ADL's, functional mobility, safety, upper extremity strength, adaptive techniques and equipment, NMR, cognitive and visual perceptual rx.   Goals are: supervision. 9. SLP will assess and treat for/with: cognition, communication.  Goals  are: supervision. 10. Case Management and Social Worker will assess and treat for psychological issues and discharge planning. 11. Team conference will be held weekly to assess progress toward goals and to determine barriers to discharge. 12. Patient will receive at least 3 hours of therapy per day at least 5 days per week. 13. ELOS: 2 to 2.5 weeks      Prognosis:  excellent   Medical Problem List and Plan: 1. Right SAH due to ACOM aneurysm. Status post coiling and IVC drain 2. DVT  Prophylaxis/Anticoagulation: SCDs. Monitor for any signs of DVT 3. Pain Management: Oxycodone as needed. Monitor with increased mobility 4. Mood/delirium. Bed alarm for safety. Followup speech therapy for cognition  -showing improvement but has poor judgement and insight.  -is a fall risk----fall safety planning 5. Neuropsych: This patient is capable of making decisions on his/her own behalf. 6. Hyponatremia. Resolved. Taper off Declomycin and patient remains on sodium chloride tablets. Followup chemistries during rehab stay. Wean off Na+ tabs? 7. Hypertension. Discuss plan to taper nimodipine with IR           Ranelle Oyster, MD, [facility] Physical Medicine & Rehabilitation   12/16/2012

## 2012-12-17 NOTE — Progress Notes (Signed)
Pt arrived to room 4027 from 3100 via W/C. Oriented x2;  Disoriented to time, situation. Oriented to room and rehab process, admit process initiated, however unsure of accuracy responses; reports niece will be in tomorrow. May obtain further info at that time.  Bed alarm on.

## 2012-12-17 NOTE — Progress Notes (Signed)
Physical Therapy Treatment Patient Details Name: Dennis Wyatt MRN: [id number] DOB: May 28, 1961 Today's Date: 12/17/2012 Time: 4098-1191 PT Time Calculation (min): 19 min  PT Assessment / Plan / Recommendation Comments on Treatment Session  52 y.o. male admitted to [facility] for R SAH due to aneurysm.  S/p coiling.  He presents today moving better with increased stability on his feet, but continued decrease awareness of deficits and decreased safety awareness.      Follow Up Recommendations  CIR     Does the patient have the potential to tolerate intense rehabilitation    yes  Barriers to Discharge   none      Equipment Recommendations  Rolling walker with 5" wheels    Recommendations for Other Services   none  Frequency Min 4X/week   Plan Discharge plan remains appropriate    Precautions / Restrictions Precautions Precautions: Fall Precaution Comments: Impulsive, confused   Pertinent Vitals/Pain See vitals flow sheet.     Mobility  Bed Mobility Bed Mobility: Rolling Right;Rolling Left;Right Sidelying to Sit;Sitting - Scoot to [place] of Bed Rolling Right: 4: Min assist Rolling Left: 4: Min assist Right Sidelying to Sit: 3: Mod assist Sitting - Scoot to Edge of Bed: 4: Min assist Details for Bed Mobility Assistance: Assist to elevate trunk OOB to come up into sitting position. Transfers Sit to Stand: 1: +2 Total assist;From bed;With upper extremity assist Sit to Stand: Patient Percentage: 70% Stand to Sit: 1: +2 Total assist;To chair/3-in-1;With upper extremity assist;With armrests Stand to Sit: Patient Percentage: 70% Stand Pivot Transfers: 1: +2 Total assist Stand Pivot Transfers: Patient Percentage: 70% Details for Transfer Assistance: Assist for power up from lower surface of  bed. Multimodal cues for safe placement of feet and hands.  Right knee blocked for safety, but he was able to support his weight over it without buckling while stepping around      PT Goals Acute  Rehab PT Goals PT Goal: Supine/Side to Sit - Progress: Progressing toward goal PT Goal: Sit to Stand - Progress: Progressing toward goal PT Goal: Stand to Sit - Progress: Progressing toward goal  Visit Information  Last PT Received On: 12/17/12 Assistance Needed: +2 PT/OT Co-Evaluation/Treatment: Yes    Subjective Data  Subjective: Pt friendly, but with decreased awareness of his deficits.     Cognition  Cognition Arousal/Alertness: Awake/alert Behavior During Therapy: Impulsive Overall Cognitive Status: Impaired/Different from baseline Area of Impairment: Orientation;Attention;Memory;Safety/judgement;Problem solving Orientation Level: Time Current Attention Level: Focused Memory: Decreased short-term memory Safety/Judgement: Decreased awareness of safety;Decreased awareness of deficits Problem Solving: Requires verbal cues;Requires tactile cues General Comments: Educated pt on using call button to call RN to assist with mobility but pt confused why he needed help from nursing staff ("The bathroom is right there, I can do it.") despite fact that pt needed +2 help from bed<>chair. Chair alarm set and pt getting up multiple times despite education    Balance  Static Sitting Balance Static Sitting - Balance Support: Bilateral upper extremity supported;Feet supported Static Sitting - Level of Assistance: 5: Stand by assistance Static Sitting - Comment/# of Minutes: Supervision more for impulsivity than for balance.   Static Standing Balance Static Standing - Balance Support: Bilateral upper extremity supported Static Standing - Level of Assistance: 1: +2 Total assist;Patient percentage (comment) (70%) Static Standing - Comment/# of Minutes: "Back up, I want to try to do it on my own"  End of Session PT - End of Session Activity Tolerance: Patient tolerated treatment well Patient left:  in chair;with call bell/phone within reach;with chair alarm set Nurse Communication: Mobility  status     Lurena Joiner B. Naomie Crow, PT, DPT [phone]   12/17/2012, 3:56 PM

## 2012-12-17 NOTE — Interval H&P Note (Signed)
Dennis Wyatt was admitted today to Inpatient Rehabilitation with the diagnosis of right SAH d/t ACOM ruptured aneurysm.  The patient's history has been reviewed, patient examined, and there is no change in status.  Patient continues to be appropriate for intensive inpatient rehabilitation.  I have reviewed the patient's chart and labs.  Questions were answered to the patient's satisfaction.  Dennis Wyatt T 12/17/2012, 3:34 PM

## 2012-12-18 ENCOUNTER — Inpatient Hospital Stay (HOSPITAL_COMMUNITY): Payer: No Typology Code available for payment source | Admitting: Speech Pathology

## 2012-12-18 ENCOUNTER — Inpatient Hospital Stay (HOSPITAL_COMMUNITY): Payer: No Typology Code available for payment source | Admitting: Occupational Therapy

## 2012-12-18 ENCOUNTER — Inpatient Hospital Stay (HOSPITAL_COMMUNITY): Payer: No Typology Code available for payment source | Admitting: Physical Therapy

## 2012-12-18 MED ORDER — ALPRAZOLAM 0.5 MG PO TABS
1.0000 mg | ORAL_TABLET | Freq: Once | ORAL | Status: AC
  Administered 2012-12-18: 1 mg via ORAL
  Filled 2012-12-18: qty 2

## 2012-12-18 NOTE — Evaluation (Addendum)
Speech Language Pathology Assessment and Plan  Patient Details  Name: Dennis Wyatt MRN: [id number] Date of Birth: 10/24/1960  SLP Diagnosis: Cognitive Impairments;Speech and Language deficits  Rehab Potential: Good ELOS: 2 weeks   Today's Date: 12/18/2012 Time: 1000-1050 Time Calculation (min): 50 min  Problem List:  Patient Active Problem List   Diagnosis Date Noted  . Hyponatremia 12/13/2012  . Hyperglycemia 12/06/2012  . Subarachnoid hemorrhage from anterior communicating artery aneurysm 12/05/2012  . Anterior communicating artery aneurysm 12/05/2012  . Acute respiratory failure 12/03/2012  . Malignant hypertension 12/03/2012  . Altered mental status 12/03/2012   Past Medical History:  Past Medical History  Diagnosis Date  . Hypertension    Past Surgical History:  Past Surgical History  Procedure Laterality Date  . Radiology with anesthesia N/A 12/03/2012    Procedure: RADIOLOGY WITH ANESTHESIA;  Surgeon: Oneal Grout, MD;  Location: [facility] OR;  Service: Radiology;  Laterality: N/A;    Assessment / Plan / Recommendation Clinical Impression                 Skilled Intervention Dennis Wyatt is a 52 y.o. right-handed male with history of hypertension, admitted 12/03/2012 with headache and dizziness. Blood pressure noted to be 204/123. Cranial CT scan showed subarachnoid hemorrhage with concern for ruptured aneurysm. Four-vessel cerebral arteriogram showed 11 mm x 7 mm irregular right ACA A1 aneurysm. Interventional radiology consulted and patient underwent coiling of aneurysm, IVC drain. Patient did remain intubated for a time and was followed by critical care medicine. Latest follow-up cranial CT scan 12/13/2012 was stable and external ventricular drain has been removed. Close monitoring of blood pressure as patient remained on nimodipine x21 day protocol and await plan to taper. He is tolerating a regular consistency diet. Patient with bouts of hyponatremia  115 and placed on Declomycin as well as sodium chloride tablets with latest follow-up sodium level 135 on 12/16/2012. Physical and occupational therapy evaluations completed with noted bouts of confusion. Recommendations have been made for physical medicine rehabilitation consult to consider inpatient rehabilitation services. Patient was felt to be a good candidate for inpatient rehabilitation services and was admitted 12/17/12.  Cognitive-Linguistic Evaluation completed and revealed moderate cognitive deficits, characterized by impaired sustained attention, recall and awaerness which impact his ability to solve old and new problem related to his self-care as well as follow commands and retrieve words.  As a result, it is recommended that this patient receive skilled SLP services to address these deficits, maximize functional independence and reduce burden of care prior to discharge home with 24/7 supervision and follow-up Outpatient therapy.   SLP initiated skilled treatment and facilitated a 4 step picture sequencing task with Mod assist cues for accurate order and to verbally express what was happening in each picture.  Patient often summarized whole story; however, cues to explain each one were effective at increasing self-monitoring and correcting of sequence errors.      SLP Assessment  Patient will need skilled Speech Lanaguage Pathology Services during CIR admission    Recommendations  Supervision: Patient able to self feed;Intermittent supervision to cue for compensatory strategies (due to cognition) Oral Care Recommendations: Oral care BID Patient destination: Home (needs 24/7 Supervision ) Follow up Recommendations: 24 hour supervision/assistance;Outpatient SLP Equipment Recommended: None recommended by SLP    SLP Frequency 5 out of 7 days   SLP Treatment/Interventions Cognitive remediation/compensation;Cueing hierarchy;Environmental controls;Functional tasks;Internal/external  aids;Patient/family education;Speech/Language facilitation;Therapeutic Activities    Pain Pain Assessment Pain Assessment: 0-10 Pain Score:   5  Pain Type: Acute pain;Chronic pain Pain Location: Head Pain Orientation: Right Pain Descriptors / Indicators: Aching Pain Onset: Progressive Patients Stated Pain Goal: 2 Pain Intervention(s): RN made aware Multiple Pain Sites: No Prior Functioning Cognitive/Linguistic Baseline: Within functional limits Type of Home: House Lives With: Alone  Short Term Goals: Week 1: SLP Short Term Goal 1 (Week 1): Patient will sustain attention to familiar task for 10 minutes with Mod assist vebral cues for re-direction. SLP Short Term Goal 2 (Week 1): Patient will utilize external aids to assist with orientation with Mod assist question cues. SLP Short Term Goal 3 (Week 1): Patient will verbalize 3 deficits that impact his ability to safely complete basic ADLs with Mod question cues. SLP Short Term Goal 4 (Week 1): Patient will demonstrate basic problem solving with Mod assist clinician cues. SLP Short Term Goal 5 (Week 1): Patient will utilize word finding strategies during structured tasks with Mod assist verbal cues.  See FIM for current functional status Refer to Care Plan for Long Term Goals  Recommendations for other services: None  Discharge Criteria: Patient will be discharged from SLP if patient refuses treatment 3 consecutive times without medical reason, if treatment goals not met, if there is a change in medical status, if patient makes no progress towards goals or if patient is discharged from hospital.  The above assessment, treatment plan, treatment alternatives and goals were discussed and mutually agreed upon: by patient  Fae Pippin, M.A., CCC-SLP [phone]  [name] 12/18/2012, 11:28 AM

## 2012-12-18 NOTE — Progress Notes (Signed)
Physical Therapy Session Note  Patient Details  Name: Dennis Wyatt MRN: [id number] Date of Birth: 03-Oct-1960  Today's Date: 12/18/2012 Time: 1500-1530 Time Calculation (min): 30 min  Therapy Documentation Precautions:  Precautions Precautions: Fall Precaution Comments: confused, disoriented, light headed Restrictions Weight Bearing Restrictions: No  Pain: Headache 4/10 pre-tx and 8/10 post tx session (nursing notified)  Gait Training:(15') using RW 3 x 140' with S/min-guard, up/down 4 steps with patient using B handrails for added stability and S/min-guard assist. Therapeutic Exercise:(15') Nu-Step x 5' at level 3 with 2 rest breaks.    Therapy/Group: Individual Therapy  Rex Kras 12/18/2012, 4:37 PM

## 2012-12-18 NOTE — Plan of Care (Addendum)
Overall Plan of Care [facility]) Patient Details Name: Dennis Wyatt MRN: [id number] DOB: Dec 30, 1960  Diagnosis:  SAH  Co-morbidities: HTN  Functional Problem List  Patient demonstrates impairments in the following areas: Balance, Behavior, Bladder, Bowel, Cognition, Endurance, Medication Management, Motor, Pain, Safety and Skin Integrity  Basic ADL's: eating, grooming, bathing, dressing and toileting Advanced ADL's: simple meal preparation and laundry  Transfers:  bed mobility, bed to chair, toilet, tub/shower, car and furniture Locomotion:  ambulation and stairs  Additional Impairments:  Communication  comprehension and expression and Social Cognition   social interaction  Anticipated Outcomes Item Anticipated Outcome  Eating/Swallowing  Intermittent Supervision   Basic self-care  supervision  Tolieting  supervision  Bowel/Bladder  Min assist  Transfers  Supervision/Mod-I  Locomotion  Using LRAD x 200' S/Mod-I  Communication  Supervision   Cognition  Min assist  Pain  Less than or equal to 3  Safety/Judgment  Min assist  Other     Therapy Plan: PT Intensity: Minimum of 1-2 x/day ,45 to 90 minutes PT Frequency: 5 out of 7 days PT Duration Estimated Length of Stay: 10-12 days OT Intensity: Minimum of 1-2 x/day, 45 to 90 minutes OT Frequency: 5 out of 7 days OT Duration/Estimated Length of Stay: 10 days SLP Intensity: Minumum of 1-2 x/day, 30 to 90 minutes SLP Frequency: 5 out of 7 days SLP Duration/Estimated Length of Stay: 2 weeks    Team Interventions: Item RN PT OT SLP SW TR Other  Self Care/Advanced ADL Retraining   x      Neuromuscular Re-Education  x x      Therapeutic Activities  x x   x   UE/LE Strength Training/ROM  x x   x   UE/LE Coordination Activities  x x   x   Visual/Perceptual Remediation/Compensation  x x   x   DME/Adaptive Equipment Instruction  x x   x   Therapeutic Exercise  x x   x   Balance/Vestibular Training  x x   x    Patient/Family Education x x x x  x   Cognitive Remediation/Compensation  x x x  x   Functional Mobility Training  x x   x   Ambulation/Gait Training  x       Clinical research associate      x   Community Reintegration   x      Dysphagia/Aspiration Landscape architect Facilitation    x     Bladder Management   x      Bowel Management   x      Disease Management/Prevention         Pain Management   x      Medication Management         Skin Care/Wound Management         Splinting/Orthotics         Discharge Planning   x x  x   Psychosocial Support   x   x                          Team Discharge Planning: Destination: PT-Home ,OT- Home (if support available) , SLP-Home (needs 24/7 Supervision ) Projected Follow-up: PT-Home health PT, OT-  Outpatient OT, SLP-24 hour supervision/assistance;Outpatient SLP Projected Equipment Needs: PT- ,  OT- Tub/shower seat;3 in 1 bedside comode, SLP-None recommended by SLP Patient/family involved in discharge planning: PT- Patient;Family member/caregiver,  OT-Patient unable/family or caregiver not available;Patient, SLP-Patient  MD ELOS: 10-14 days Medical Rehab Prognosis:  Good Assessment: 52 year-old male limited for subarachnoid hemorrhage now requiring 24 7 rehabilitation RN and M.D. as well as CIR level PT OT and speech therapy. Treatment team will focus on cognition, social appropriateness, balance, safety awareness. Goals are supervision to min assist level for ADLs and mobility.    See Team Conference Notes for weekly updates to the plan of care

## 2012-12-18 NOTE — Progress Notes (Signed)
Patient ID: Dennis Wyatt, male   DOB: 28-Jul-1960, 52 y.o.   MRN: [id number] Subjective/Complaints: 52 y.o. right-handed male with history of hypertension. Admitted 12/03/2012 with headache and dizziness. Blood pressure noted to be 204/123. Cranial CT scan showed subarachnoid hemorrhage with concern for ruptured aneurysm. Four-vessel cerebral arteriogram showed 11 mm x 7 mm irregular right ACA A1 aneurysm. Interventional radiology consulted and patient underwent coiling of aneurysm, IVC drain. Patient did remain intubated for a time followed by critical care medicine. Latest followup cranial CT scan 12/13/2012 stable and external ventricular drain has been removed. Close monitoring of blood pressure as patient remained on nimodipine x21 day protocol and await plan to taper . He is tolerating a regular consistency diet. Patient with bouts of hyponatremia 115 and placed on Declomycin as well as sodium chloride tablets with latest followup sodium level 135 on 12/16/2012  Arouses from sleep answers simple questions by mumbling Review of Systems  Unable to perform ROS: mental acuity    Objective: Vital Signs: Blood pressure 152/78, pulse 59, temperature 98 F (36.7 C), temperature source Oral, resp. rate 18, SpO2 98.00%. No results found. Results for orders placed during the hospital encounter of 12/03/12 (from the past 72 hour(s))  SODIUM     Status: None   Collection Time    12/15/12  3:04 PM      Result Value Range   Sodium 135  135 - 145 mEq/L  SODIUM     Status: None   Collection Time    12/15/12  9:17 PM      Result Value Range   Sodium 135  135 - 145 mEq/L  SODIUM     Status: Abnormal   Collection Time    12/16/12 12:01 AM      Result Value Range   Sodium 134 (*) 135 - 145 mEq/L  SODIUM     Status: None   Collection Time    12/16/12  5:30 AM      Result Value Range   Sodium 135  135 - 145 mEq/L  SODIUM     Status: None   Collection Time    12/16/12 12:15 PM      Result Value  Range   Sodium 135  135 - 145 mEq/L  SODIUM     Status: None   Collection Time    12/16/12  6:00 PM      Result Value Range   Sodium 135  135 - 145 mEq/L  SODIUM     Status: None   Collection Time    12/17/12 12:20 AM      Result Value Range   Sodium 138  135 - 145 mEq/L  SODIUM     Status: None   Collection Time    12/17/12  5:50 AM      Result Value Range   Sodium 138  135 - 145 mEq/L  SODIUM     Status: Abnormal   Collection Time    12/17/12 12:00 PM      Result Value Range   Sodium 132 (*) 135 - 145 mEq/L      Prior drain sites closed/staples. No drainage.Eyes: EOM are normal.  Neck: Neck supple. No JVD present. No tracheal deviation present. No thyromegaly present.  Cardiovascular: Normal rate and regular rhythm. No murmurs rubs or galloops  Pulmonary/Chest: Breath sounds normal. No respiratory distress. No wheezes or rales or rhonchi  Abdominal: Soft. Bowel sounds are normal. He exhibits no distension. There is no tenderness.  Musculoskeletal: He exhibits no edema.  Lymphadenopathy:  He has no cervical adenopathy.  Neurological: He is lethargic  Patient was able to provide his name . Patient with limited awareness of his deficits. He did follow simple commands.  He needed cues to maintain attention. Moved all 4's with 4 to 4+/5 strength with. No gross sensory deficits. A little impulsive. No gross limb ataxia. Senses pain and LT in all 4 limbs.    Assessment/Plan: 1. Functional deficits secondary to [facility] due to ruptured ACOM aneurysm which require 3+ hours per day of interdisciplinary therapy in a comprehensive inpatient rehab setting. Physiatrist is providing close team supervision and 24 hour management of active medical problems listed below. Physiatrist and rehab team continue to assess barriers to discharge/monitor patient progress toward functional and medical goals. FIM:                   Comprehension Comprehension Mode: Auditory Comprehension:  4-Understands basic 75 - 89% of the time/requires cueing 10 - 24% of the time  Expression Expression Mode: Verbal Expression: 5-Expresses basic needs/ideas: With no assist  Social Interaction Social Interaction: 6-Interacts appropriately with others with medication or extra time (anti-anxiety, antidepressant).  Problem Solving Problem Solving: 3-Solves basic 50 - 74% of the time/requires cueing 25 - 49% of the time  Memory Memory: 2-Recognizes or recalls 25 - 49% of the time/requires cueing 51 - 75% of the time Medical Problem List and Plan:  1. Right SAH due to ACOM aneurysm. Status post coiling and IVC drain  2. DVT Prophylaxis/Anticoagulation: SCDs. Monitor for any signs of DVT  3. Pain Management: Oxycodone as needed. Monitor with increased mobility  4. Mood/delirium. Bed alarm for safety. Followup speech therapy for cognition  -showing improvement but has poor judgement and insight.  -is a fall risk----fall safety planning  5. Neuropsych: This patient is not capable of making decisions on his/her own behalf.  6. Hyponatremia. Resolved. Taper off Declomycin and patient remains on sodium chloride tablets. Followup chemistries during rehab stay. Wean off Na+ tabs?  7. Hypertension. Discuss plan to taper nimodipine with IR  LOS (Days) 1 A FACE TO FACE EVALUATION WAS PERFORMED  Adamariz Gillott E 12/18/2012, 8:44 AM

## 2012-12-18 NOTE — Evaluation (Signed)
Physical Therapy Assessment and Plan  Patient Details  Name: Dennis Wyatt MRN: [id number] Date of Birth: 08/11/1960  PT Diagnosis: Abnormality of gait and Impaired cognition Rehab Potential: Good ELOS: 10-12 days   Today's Date: 12/18/2012 Time: 1300-1400 Time Calculation (min): 60 min  Problem List:  Patient Active Problem List   Diagnosis Date Noted  . Hyponatremia 12/13/2012  . Hyperglycemia 12/06/2012  . Subarachnoid hemorrhage from anterior communicating artery aneurysm 12/05/2012  . Anterior communicating artery aneurysm 12/05/2012  . Acute respiratory failure 12/03/2012  . Malignant hypertension 12/03/2012  . Altered mental status 12/03/2012    Past Medical History:  Past Medical History  Diagnosis Date  . Hypertension    Past Surgical History:  Past Surgical History  Procedure Laterality Date  . Radiology with anesthesia N/A 12/03/2012    Procedure: RADIOLOGY WITH ANESTHESIA;  Surgeon: Oneal Grout, MD;  Location: [facility] OR;  Service: Radiology;  Laterality: N/A;    Assessment & Plan Clinical Impression: Dennis Wyatt is a 52 y.o. right-handed male with history of hypertension. Admitted 12/03/2012 with headache and dizziness. Blood pressure noted to be 204/123. Cranial CT scan showed subarachnoid hemorrhage with concern for ruptured aneurysm. Four-vessel cerebral arteriogram showed 11 mm x 7 mm irregular right ACA A1 aneurysm. Interventional radiology consulted and patient underwent coiling of aneurysm, IVC drain. Patient did remain intubated for a time followed by critical care medicine. Latest followup cranial CT scan 12/13/2012 stable and external ventricular drain has been removed. Close monitoring of blood pressure as patient remained on nimodipine x21 day protocol and await plan to taper . He is tolerating a regular consistency diet. Patient with bouts of hyponatremia 115 and placed on Declomycin as well as sodium chloride tablets with latest followup sodium level  135 on 12/16/2012. Physical and occupational therapy evaluations completed with noted bouts of confusion. Recommendations have been made for physical medicine rehabilitation consult to consider inpatient rehabilitation services.   Patient transferred to CIR on 12/17/2012 .   Patient currently requires min with mobility secondary to motor apraxia.  Prior to hospitalization, patient was independent  with mobility and lived with Alone in a House home.  Home access is 2 onto porch, 1 to enter screened in porch, and one small step to enter home.Stairs to enter.  Patient will benefit from skilled PT intervention to maximize safe functional mobility, minimize fall risk and decrease caregiver burden for planned discharge home with 24 hour supervision.  Anticipate patient will benefit from follow up HH at discharge.  PT - End of Session Activity Tolerance: Tolerates 10 - 20 min activity with multiple rests Endurance Deficit: Yes PT Assessment Rehab Potential: Good Barriers to Discharge: None PT Plan PT Intensity: Minimum of 1-2 x/day ,45 to 90 minutes PT Frequency: 5 out of 7 days PT Duration Estimated Length of Stay: 10-12 days PT Treatment/Interventions: Ambulation/gait training;Balance/vestibular training;Cognitive remediation/compensation;DME/adaptive equipment instruction;Functional mobility training;Patient/family education;Stair training;Therapeutic Activities;Therapeutic Exercise;UE/LE Strength taining/ROM;UE/LE Coordination activities PT Recommendation Follow Up Recommendations: Home health PT Patient destination: Home  Skilled Therapeutic Intervention   PT Evaluation Precautions/Restrictions Precautions Precautions: Fall Precaution Comments: confused, disoriented, light headed Restrictions Weight Bearing Restrictions: No General Chart Reviewed: Yes Family/Caregiver Present: Yes (niece (crystal)) Vital SignsTherapy Vitals BP: 160/80 mmHg Pain Pain Assessment Pain Assessment:  0-10 Pain Score:   5 Pain Type: Acute pain Pain Location: Head Pain Orientation: Anterior Pain Descriptors / Indicators: Aching Pain Onset: Progressive Patients Stated Pain Goal: 2 Pain Intervention(s): RN made aware Multiple Pain Sites: No Home Living/Prior Functioning  Home Living Lives With: Alone Available Help at Discharge: Family;Available 24 hours/day (niece) Type of Home: House Home Access: Stairs to enter [employer] of Steps: 2 onto porch, 1 to enter screened in porch, and one small step to enter home. 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) Vision/Perception  Vision - History Baseline Vision: Wears glasses all the time Patient Visual Report: No change from baseline  Cognition Overall Cognitive Status: Impaired/Different from baseline Arousal/Alertness: Lethargic Orientation Level: Oriented to person Attention: Selective;Sustained Focused Attention: Appears intact Focused Attention Impairment: Verbal basic;Functional basic Sustained Attention: Impaired Sustained Attention Impairment: Verbal basic;Functional basic Selective Attention: Impaired Selective Attention Impairment: Verbal basic;Functional basic Memory: Impaired Memory Impairment: Storage deficit;Retrieval deficit;Decreased recall of new information;Decreased short term memory;Decreased long term memory Decreased Long Term Memory: Functional basic Decreased Short Term Memory: Verbal basic;Functional basic Awareness: Impaired Awareness Impairment: Intellectual impairment Problem Solving: Impaired Problem Solving Impairment: Verbal basic;Functional  basic Executive Function:  (all impaired due to lower level deficits) Sensation Sensation Light Touch: Appears Intact Stereognosis: Not tested Hot/Cold: Not tested Proprioception: Not tested Coordination Gross Motor Movements are Fluid and Coordinated: No Fine Motor Movements are Fluid and Coordinated: Yes Finger Nose Finger Test: R index finger to nose with minimal impairment Heel Shin Test: R heel on L shin with minimal impairment Motor  Motor Motor: Within Functional Limits  Mobility Bed Mobility Bed Mobility: Rolling Right;Rolling Left;Sitting - Scoot to Edge of Bed;Sit to Supine;Supine to Sit;Scooting to [facility];Left Sidelying to Sit Rolling Right: 4: Min assist Rolling Left: 4: Min assist Left Sidelying to Sit: 4: Min assist;With rails Supine to Sit: 4: Min assist Sitting - Scoot to Edge of Bed: 4: Min guard;With rail Sit to Supine: 4: Min guard;HOB flat Scooting to HOB: 4: Min assist Transfers Sit to Stand: 4: Min assist;With upper extremity assist;From bed Stand to Sit: 4: Min assist;With upper extremity assist;To bed Stand Pivot Transfers: 4: Min assist Locomotion  Ambulation Ambulation: Yes Ambulation/Gait Assistance: 4: Min assist Ambulation Distance (Feet): 100 Feet Assistive device: Rolling walker Gait Gait: Yes Gait Pattern: Step-through pattern;Decreased step length - right;Decreased step length - left;Wide base of support Stairs / Additional Locomotion Stairs: Yes Stairs Assistance: 4: Min assist Stair Management Technique: Two rails Number of Stairs: 4 Wheelchair Mobility Wheelchair Mobility: No  Trunk/Postural Assessment  Cervical Assessment Cervical Assessment: Within Functional Limits Thoracic Assessment Thoracic Assessment: Within Functional Limits Lumbar Assessment Lumbar Assessment: Within Functional Limits Postural Control Postural Control: Within Functional Limits  Balance Balance Balance Assessed: Yes Dynamic Sitting Balance Dynamic  Sitting - Balance Support: Feet supported Dynamic Sitting - Level of Assistance: 5: Stand by assistance Static Standing Balance Static Standing - Balance Support: No upper extremity supported Static Standing - Level of Assistance: 4: Min assist Dynamic Standing Balance Dynamic Standing - Balance Support: No upper extremity supported Dynamic Standing - Level of Assistance: 3: Mod assist Extremity Assessment  RUE Assessment RUE Assessment: Within Functional Limits (For self care tasks) LUE Assessment LUE Assessment: Within Functional Limits (for self care tasks) RLE Assessment RLE Assessment: Within Functional Limits LLE Assessment LLE Assessment: Within Functional Limits  FIM:  FIM - Banker Devices: Bed rails Bed/Chair Transfer: 4: Supine > Sit: Min A (steadying Pt. > 75%/lift  1 leg);4: Sit > Supine: Min A (steadying pt. > 75%/lift 1 leg);4: Bed > Chair or W/C: Min A (steadying Pt. > 75%);4: Chair or W/C > Bed: Min A (steadying Pt. > 75%) FIM - Locomotion: Wheelchair Locomotion: Wheelchair: 0: Activity did not occur FIM - Locomotion: Ambulation Locomotion: Ambulation Assistive Devices: Designer, industrial/product Ambulation/Gait Assistance: 4: Min assist Locomotion: Ambulation: 2: Travels 50 - 149 ft with minimal assistance (Pt.>75%) FIM - Locomotion: Stairs Locomotion: Building control surveyor: Hand rail - 2 Locomotion: Stairs: 2: Up and Down 4 - 11 stairs with minimal assistance (Pt.>75%)   Refer to Care Plan for Long Term Goals  Recommendations for other services: None  Discharge Criteria: Patient will be discharged from PT if patient refuses treatment 3 consecutive times without medical reason, if treatment goals not met, if there is a change in medical status, if patient makes no progress towards goals or if patient is discharged from hospital.  The above assessment, treatment plan, treatment alternatives and goals were discussed and mutually  agreed upon: by patient and by family  Rex Kras 12/18/2012, 4:14 PM

## 2012-12-18 NOTE — Evaluation (Addendum)
Occupational Therapy Assessment and Plan  Patient Details  Name: Dennis Wyatt MRN: [id number] Date of Birth: Aug 08, 1960  OT Diagnosis: abnormal posture, acute pain, altered mental status, apraxia, cognitive deficits, disturbance of vision, lumbago (low back pain) and muscle weakness (generalized) Rehab Potential:  Good ELOS:   10-12 days  Today's Date: 12/18/2012 Time: 1308-6578    Problem List:  Patient Active Problem List   Diagnosis Date Noted  . Hyponatremia 12/13/2012  . Hyperglycemia 12/06/2012  . Subarachnoid hemorrhage from anterior communicating artery aneurysm 12/05/2012  . Anterior communicating artery aneurysm 12/05/2012  . Acute respiratory failure 12/03/2012  . Malignant hypertension 12/03/2012  . Altered mental status 12/03/2012    Past Medical History:  Past Medical History  Diagnosis Date  . Hypertension    Past Surgical History:  Past Surgical History  Procedure Laterality Date  . Radiology with anesthesia N/A 12/03/2012    Procedure: RADIOLOGY WITH ANESTHESIA;  Surgeon: Oneal Grout, MD;  Location: [facility] OR;  Service: Radiology;  Laterality: N/A;    Assessment & Plan Clinical Impression:  Patient is a 52 y.o. right-handed male with history of hypertension. Admitted 12/03/2012 with headache and dizziness. Blood pressure noted to be 204/123. Cranial CT scan showed subarachnoid hemorrhage with concern for ruptured aneurysm. Four-vessel cerebral arteriogram showed 11 mm x 7 mm irregular right ACA A1 aneurysm. Interventional radiology consulted and patient underwent coiling of aneurysm, IVC drain. Patient did remain intubated for a time followed by critical care medicine. Latest followup cranial CT scan 12/13/2012 stable and external ventricular drain has been removed.  He is tolerating a regular consistency diet. Physical and occupational therapy evaluations completed with noted bouts of confusion.  Patient transferred to CIR on 12/17/2012 .    Patient currently  requires max with basic self-care skills secondary to muscle weakness, motor apraxia, decreased visual acuity and decreased initiation, decreased attention, decreased awareness, decreased problem solving, decreased safety awareness and decreased memory.  Prior to hospitalization, patient could complete basic ADL'S with independent .  Patient will benefit from skilled intervention to decrease level of assist with basic self-care skills prior to discharge home with care partner.  Anticipate patient will require 24 hour supervision and follow up outpatient.      Skilled Therapeutic Intervention Patient seen this am for OT assessment during bathing, dressing, hygiene task.  Patient lethargic, but pleasant throughout this session.  Patient with flexed posture in standing and reporting back and left lower extremity pain.   Patient with decreased stand tolerance, and difficulty initial achieving standing from low surface.  Patient did better with subsequent stands, and indicates mornings are when he feels most stiff.  Patient incontinent of urine and unaware.  Patient with questionable apraxia.  He had much difficulty beginning and terminating tasks, complicated by confusion and decreased attention.  Patient more distracted by auditory than visual stimulation.    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  The above assessment, treatment plan, treatment alternatives and goals were discussed and mutually agreed upon: No family available/patient unable  Collier Salina 12/18/2012, 8:00 AM

## 2012-12-19 ENCOUNTER — Inpatient Hospital Stay (HOSPITAL_COMMUNITY): Payer: No Typology Code available for payment source | Admitting: Physical Therapy

## 2012-12-19 NOTE — Progress Notes (Signed)
Multi attempts to get OOB. Requiring 1:1 at this point.Poor carry over of info. Alert and oriented to self only. Inappropriate comments and touching. Easily redirected and distracted. Complaining of Headache rubbing; forehead, non-specific type of pain. Attempted heat and ice without success. PRN oxy IR given at 1845 without relief per patient. Appears anxious. Paged Dr. Wynn Banker, one time order of Xanax, med given. Dennis Wyatt

## 2012-12-19 NOTE — Progress Notes (Signed)
Physical Therapy Note  Patient Details  Name: Dennis Wyatt MRN: [id number] Date of Birth: Nov 27, 1960 Today's Date: 12/19/2012  1300-1325 (25 minutes) individual (pt missed 20 minutes secondary to decreased arousal) Pain: no distress noted Pt in bed upon arrival incontinent of stool and very lethargic with eyes closed in supine, rolling side to side for hygiene +2 assist (pt 20%); pt unable to participate secondary to lethargy.    Mariam Helbert,JIM 12/19/2012, 1:25 PM

## 2012-12-19 NOTE — Progress Notes (Signed)
Patient has had 3 loose stools today. Stool sent to lab per protocol for rule out c-diff. Patient placed on enteric precautions at this time. Infectious disease control paged. Awaiting return call.

## 2012-12-19 NOTE — Progress Notes (Signed)
Large, loose, incontinent stool this AM. Alfredo Martinez A

## 2012-12-19 NOTE — Progress Notes (Signed)
Restless off and on all night. Throwing covers on floor, removing SCD's, yanking at side rails, etc. Continent of urine X one with assist of placing and emptying urinal. Incontinent of urine X one. Scheduled senna held because of 2 loose stools charted. Patient reports "some" relief with xanax, but PRN oxy still required for continued complaint of Headache. Dennis Wyatt A

## 2012-12-19 NOTE — Progress Notes (Signed)
Patient ID: Dennis Wyatt, male   DOB: 01/18/1961, 52 y.o.   MRN: [id number] Subjective/Complaints: 52 y.o. right-handed male with history of hypertension. Admitted 12/03/2012 with headache and dizziness. Blood pressure noted to be 204/123. Cranial CT scan showed subarachnoid hemorrhage with concern for ruptured aneurysm. Four-vessel cerebral arteriogram showed 11 mm x 7 mm irregular right ACA A1 aneurysm. Interventional radiology consulted and patient underwent coiling of aneurysm, IVC drain. Patient did remain intubated for a time followed by critical care medicine. Latest followup cranial CT scan 12/13/2012 stable and external ventricular drain has been removed. Close monitoring of blood pressure as patient remained on nimodipine x21 day protocol and await plan to taper . He is tolerating a regular consistency diet. Patient with bouts of hyponatremia 115 and placed on Declomycin as well as sodium chloride tablets with latest followup sodium level 135 on 12/16/2012  Up much of the night per RN. Reportedly wanted to go to topless bar. Perseverated on this. Headaches resolved after Xanax. Patient is sleepy this morning, arouses to verbal. States name, incorrectly states place, unable to state day or date Review of Systems  Unable to perform ROS: mental acuity    Objective: Vital Signs: Blood pressure 189/94, pulse 68, temperature 98 F (36.7 C), temperature source Oral, resp. rate 18, SpO2 98.00%. No results found. Results for orders placed during the hospital encounter of 12/03/12 (from the past 72 hour(s))  SODIUM     Status: None   Collection Time    12/16/12 12:15 PM      Result Value Range   Sodium 135  135 - 145 mEq/L  SODIUM     Status: None   Collection Time    12/16/12  6:00 PM      Result Value Range   Sodium 135  135 - 145 mEq/L  SODIUM     Status: None   Collection Time    12/17/12 12:20 AM      Result Value Range   Sodium 138  135 - 145 mEq/L  SODIUM     Status: None    Collection Time    12/17/12  5:50 AM      Result Value Range   Sodium 138  135 - 145 mEq/L  SODIUM     Status: Abnormal   Collection Time    12/17/12 12:00 PM      Result Value Range   Sodium 132 (*) 135 - 145 mEq/L      Prior drain sites closed/staples. No drainage.Eyes: EOM are normal.  Neck: Neck supple. No JVD present. No tracheal deviation present. No thyromegaly present.  Cardiovascular: Normal rate and regular rhythm. No murmurs rubs or galloops  Pulmonary/Chest: Breath sounds normal. No respiratory distress. No wheezes or rales or rhonchi  Abdominal: Soft. Bowel sounds are normal. He exhibits no distension. There is no tenderness.  Musculoskeletal: He exhibits no edema.  Lymphadenopathy:  He has no cervical adenopathy.  Neurological: He is lethargic  Patient was able to provide his name . Patient with limited awareness of his deficits. He did follow simple commands.  He needed cues to maintain arousal. Moved all 4's with 4 to 4+/5 strength with. No gross sensory deficits. A little impulsive. No gross limb ataxia. Senses pain and LT in all 4 limbs.    Assessment/Plan: 1. Functional deficits secondary to [facility] due to ruptured ACOM aneurysm which require 54 years in a comprehensive inpatient rehab setting. Physiatrist is providing close team supervision and  24 hour management of active medical problems listed below. Physiatrist and rehab team continue to assess barriers to discharge/monitor patient progress toward functional and medical goals. FIM: FIM - Bathing Bathing Steps Patient Completed: Chest;Right Arm;Left Arm;Abdomen;Front perineal area;Right upper leg;Left upper leg Bathing: 3: Mod-Patient completes 5-7 25f 10 parts or 50-74%  FIM - Upper Body Dressing/Undressing Upper body dressing/undressing steps patient completed: Thread/unthread right sleeve of pullover shirt/dresss;Thread/unthread left sleeve of pullover shirt/dress;Put head  through opening of pull over shirt/dress;Pull shirt over trunk Upper body dressing/undressing: 5: Set-up assist to: Obtain clothing/put away FIM - Lower Body Dressing/Undressing Lower body dressing/undressing: 1: Total-Patient completed less than 25% of tasks  FIM - Toileting Toileting steps completed by patient: Adjust clothing prior to toileting Toileting: 2: Max-Patient completed 1 of 3 steps  FIM - Diplomatic Services operational officer Devices: Grab bars Toilet Transfers: 3-To toilet/BSC: Mod A (lift or lower assist);3-From toilet/BSC: Mod A (lift or lower assist)  FIM - Bed/Chair Transfer Bed/Chair Transfer Assistive Devices: Bed rails Bed/Chair Transfer: 4: Supine > Sit: Min A (steadying Pt. > 75%/lift 1 leg);4: Sit > Supine: Min A (steadying pt. > 75%/lift 1 leg);4: Bed > Chair or W/C: Min A (steadying Pt. > 75%);4: Chair or W/C > Bed: Min A (steadying Pt. > 75%)  FIM - Locomotion: Wheelchair Locomotion: Wheelchair: 0: Activity did not occur FIM - Locomotion: Ambulation Locomotion: Ambulation Assistive Devices: Designer, industrial/product Ambulation/Gait Assistance: 4: Min assist Locomotion: Ambulation: 2: Travels 50 - 149 ft with minimal assistance (Pt.>75%)  Comprehension Comprehension Mode: Auditory Comprehension: 2-Understands basic 25 - 49% of the time/requires cueing 51 - 75% of the time  Expression Expression Mode: Verbal Expression: 4-Expresses basic 75 - 89% of the time/requires cueing 10 - 24% of the time. Needs helper to occlude trach/needs to repeat words.  Social Interaction Social Interaction: 2-Interacts appropriately 25 - 49% of time - Needs frequent redirection.  Problem Solving Problem Solving: 1-Solves basic less than 25% of the time - needs direction nearly all the time or does not effectively solve problems and may need a restraint for safety  Memory Memory: 1-Recognizes or recalls less than 25% of the time/requires cueing greater than 75% of the  time Medical Problem List and Plan:  1. Right SAH due to ACOM aneurysm. Status post coiling and IVC drain  2. DVT Prophylaxis/Anticoagulation: SCDs. Monitor for any signs of DVT  3. Pain Management: Oxycodone as needed. Monitor with increased mobility  4. Mood/delirium. Bed alarm for safety. Followup speech therapy for cognition  -showing improvement but has poor judgement and insight.  -is a fall risk----fall safety planning  5. Neuropsych: This patient is not capable of making decisions on his/her own behalf.  6. Hyponatremia. Resolved. Taper off Declomycin and patient remains on sodium chloride tablets. Followup chemistries during rehab stay. Wean off Na+ tabs?  7. Hypertension. Discuss plan to taper nimodipine with IR  LOS (Days) 2 A FACE TO FACE EVALUATION WAS PERFORMED  Cheryl Stabenow E 12/19/2012, 10:21 AM

## 2012-12-20 ENCOUNTER — Inpatient Hospital Stay (HOSPITAL_COMMUNITY): Payer: No Typology Code available for payment source

## 2012-12-20 ENCOUNTER — Inpatient Hospital Stay (HOSPITAL_COMMUNITY): Payer: No Typology Code available for payment source | Admitting: Speech Pathology

## 2012-12-20 ENCOUNTER — Inpatient Hospital Stay (HOSPITAL_COMMUNITY): Payer: No Typology Code available for payment source | Admitting: Physical Therapy

## 2012-12-20 DIAGNOSIS — I609 Nontraumatic subarachnoid hemorrhage, unspecified: Secondary | ICD-10-CM

## 2012-12-20 DIAGNOSIS — I10 Essential (primary) hypertension: Secondary | ICD-10-CM

## 2012-12-20 LAB — COMPREHENSIVE METABOLIC PANEL
ALT: 40 U/L (ref 0–53)
AST: 18 U/L (ref 0–37)
Albumin: 3.2 g/dL — ABNORMAL LOW (ref 3.5–5.2)
Alkaline Phosphatase: 141 U/L — ABNORMAL HIGH (ref 39–117)
BUN: 19 mg/dL (ref 6–23)
CO2: 28 mEq/L (ref 19–32)
Calcium: 9.5 mg/dL (ref 8.4–10.5)
Chloride: 101 mEq/L (ref 96–112)
Creatinine, Ser: 0.55 mg/dL (ref 0.50–1.35)
GFR calc Af Amer: >90 mL/min (ref >90)
GFR calc non Af Amer: >90 mL/min (ref >90)
Glucose, Bld: 93 mg/dL (ref 70–99)
Potassium: 3.8 meq/L (ref 3.5–5.1)
Sodium: 138 meq/L (ref 135–145)
Total Bilirubin: 0.7 mg/dL (ref 0.3–1.2)
Total Protein: 7 g/dL (ref 6.0–8.3)

## 2012-12-20 LAB — CBC WITH DIFFERENTIAL/PLATELET
Basophils Absolute: 0 10*3/uL (ref 0.0–0.1)
Basophils Relative: 0 % (ref 0–1)
Eosinophils Absolute: 0 10*3/uL (ref 0.0–0.7)
Eosinophils Relative: 0 % (ref 0–5)
HCT: 48.8 % (ref 39.0–52.0)
Hemoglobin: 17.7 g/dL — ABNORMAL HIGH (ref 13.0–17.0)
Lymphocytes Relative: 12 % (ref 12–46)
Lymphs Abs: 1.6 10*3/uL (ref 0.7–4.0)
MCH: 32.5 pg (ref 26.0–34.0)
MCHC: 36.3 g/dL — ABNORMAL HIGH (ref 30.0–36.0)
MCV: 89.7 fL (ref 78.0–100.0)
Monocytes Absolute: 1.4 10*3/uL — ABNORMAL HIGH (ref 0.1–1.0)
Monocytes Relative: 10 % (ref 3–12)
Neutro Abs: 10.3 10*3/uL — ABNORMAL HIGH (ref 1.7–7.7)
Neutrophils Relative %: 77 % (ref 43–77)
Platelets: 184 10*3/uL (ref 150–400)
RBC: 5.44 MIL/uL (ref 4.22–5.81)
RDW: 12.4 % (ref 11.5–15.5)
WBC: 13.3 10*3/uL — ABNORMAL HIGH (ref 4.0–10.5)

## 2012-12-20 LAB — CLOSTRIDIUM DIFFICILE BY PCR: Toxigenic C. Difficile by PCR: NEGATIVE

## 2012-12-20 MED ORDER — BOOST / RESOURCE BREEZE PO LIQD
1.0000 | Freq: Two times a day (BID) | ORAL | Status: DC
  Administered 2012-12-21 – 2012-12-31 (×17): 1 via ORAL

## 2012-12-20 NOTE — Progress Notes (Signed)
[facility] Individual Statement of Services  Patient Name:  Dennis Wyatt  Date:  12/20/2012  Welcome to the [facility].  Our goal is to provide you with an individualized program based on your diagnosis and situation, designed to meet your specific needs.  With this comprehensive rehabilitation program, you will be expected to participate in at least 3 hours of rehabilitation therapies Monday-Friday, with modified therapy programming on the weekends.  Your rehabilitation program will include the following services:  Physical Therapy (PT), Occupational Therapy (OT), Speech Therapy (ST), 24 hour per day rehabilitation nursing, Therapeutic Recreaction (TR), Neuropsychology, Case Management (Social Worker), Rehabilitation Medicine, Nutrition Services and Pharmacy Services  Weekly team conferences will be held on Tuesdays to discuss your progress.  Your Social Worker will talk with you frequently to get your input and to update you on team discussions.  Team conferences with you and your family in attendance may also be held.  Expected length of stay: 1-2 weeks  Overall anticipated outcome: Supervision  Depending on your progress and recovery, your program may change. Your Social Worker will coordinate services and will keep you informed of any changes. Your Social Worker's name and contact numbers are listed  below.  The following services may also be recommended but are not provided by the [facility]:   Driving Evaluations  Home Health Rehabiltiation Services  Outpatient Rehabilitatation [facility]  Vocational Rehabilitation   Arrangements will be made to provide these services after discharge if needed.  Arrangements include referral to agencies that provide these services.  Your insurance has been verified to be:  Tedd Sias Your primary doctor is:  None  Pertinent information will be shared with your doctor and your insurance  company.  Social Worker:  [place], [place] [phone] or ([phone]  Information discussed with and copy given to patient by: Amada Jupiter, 12/20/2012, 7:00 PM
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
## 2012-12-20 NOTE — Progress Notes (Signed)
Physical Therapy Session Note  Patient Details  Name: Dennis Wyatt MRN: [id number] Date of Birth: 10-25-1960  Today's Date: 12/20/2012 Time: 0730-0830 Time Calculation (min): 60 min   Skilled Therapeutic Interventions/Progress Updates:    Confused this AM, reports going to [facility] last night. 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 Feels he is fine to drive (immediately after he needed max assist due to near fall while walking) demonstrating decreased awareness of deficits and decreased awareness of situation.  Wheelchair propulsion x 150' + direction finding task and short term memory tasks. Ambulation x 25' with RW, min assist progressing to max assist due to Rt. LE ?weakness. Pt began "sinking" with Rt. LE excessively flexing and pt reporting that he could not correct it. Pt began letting go of walker to reach for distant chair requiring max assist and max verbal cues to prevent fall.   NuStep Level 3 progressing to level 5 x 10 min with mod verbal cues for attention to task and keeping feet on foot rests. Standing balance activity working on limits of stability and sustained attention with min assist, mod verbal cues.  Therapy Documentation Precautions:  Precautions Precautions: Fall Precaution Comments: confused, disoriented, light headed Restrictions Weight Bearing Restrictions: No Pain: Pain Assessment Pain Assessment: No/denies pain  See FIM for current functional status  Therapy/Group: Individual Therapy  Wilhemina Bonito 12/20/2012, 12:32 PM

## 2012-12-20 NOTE — Progress Notes (Signed)
Occupational Therapy Session Note  Patient Details  Name: Dennis Wyatt MRN: [id number] Date of Birth: Jun 26, 1961  Today's Date: 12/20/2012 Time: 1000-1100 Time Calculation (min): 60 min  Short Term Goals: Week 1:  OT Short Term Goal 1 (Week 1): Patient will identify need to void, and wait for assistance to arrive OT Short Term Goal 2 (Week 1): Patient will transfer to toilet with close supervision OT Short Term Goal 3 (Week 1): Patient will dress lower body with min assist OT Short Term Goal 4 (Week 1): Patient will complete steps of grooming tasks with only initial verbal directive, or environmental cue  Skilled Therapeutic Interventions/Progress Updates:    Pt seated in w/c at nurses station with niece Hydrographic surveyor).  Pt agreed to bathing at shower level this morning.  Pt's niece waited in Family Room until after shower and joined session at end of dressing tasks.  Pt requested to use toilet and was continent of bowel.  Pt amb from toilet to tub seat in shower with min A/HHA without AD.  Pt completed bathing with assistance only for bathing feet and while standing to bathe buttocks.  Pt exhibited difficultly turning in shower and required max verbal cues for RLE positioning when turning.  Pt completed dressing with sit<>stand from w/c at sink.  Pt required min verbal cues for sequencing and task initiation.  Pt stood at sink to brush teeth with steady A while standing.  Pt exhibited slight impulsive behavior and attempted to stand X 3 without assistance.  Therapy Documentation Precautions:  Precautions Precautions: Fall Precaution Comments: confused, disoriented, light headed Restrictions Weight Bearing Restrictions: No General:   Vital Signs:   Pain: Pain Assessment Pain Assessment: No/denies pain ADL:   Exercises:   Other Treatments:    See FIM for current functional status  Therapy/Group: Individual Therapy  Gannon, Heinzman 12/20/2012, 11:18 AM

## 2012-12-20 NOTE — Progress Notes (Signed)
Speech Language Pathology Daily Session Note  Patient Details  Name: Dennis Wyatt MRN: [id number] Date of Birth: [birth year]  Today's Date: 12/20/2012 Time: 1300-1330 Time Calculation (min): 30 min  Short Term Goals: Week 1: SLP Short Term Goal 1 (Week 1): Patient will sustain attention to familiar task for 10 minutes with Mod assist vebral cues for re-direction. SLP Short Term Goal 2 (Week 1): Patient will utilize external aids to assist with orientation with Mod assist question cues. SLP Short Term Goal 3 (Week 1): Patient will verbalize 3 deficits that impact his ability to safely complete basic ADLs with Mod question cues. SLP Short Term Goal 4 (Week 1): Patient will demonstrate basic problem solving with Mod assist clinician cues. SLP Short Term Goal 5 (Week 1): Patient will utilize word finding strategies during structured tasks with Mod assist verbal cues.  Skilled Therapeutic Interventions: Treatment focus on cognitive goals. SLP facilitated session by providing Max A question and visual cues for orientation to place, time and situation. Pt also required Max A question and semantic cues for intellectual awareness of physical and cognitive impairments and for functional problem solving with basic money management task. Pt's function was impacted by fatigue and lethargy and he required Max A verbal and tactile cues to sustain attention to task for ~1 minute throughout the session.    FIM:  Comprehension Comprehension Mode: Auditory Comprehension: 2-Understands basic 25 - 49% of the time/requires cueing 51 - 75% of the time Expression Expression Mode: Verbal Expression: 3-Expresses basic 50 - 74% of the time/requires cueing 25 - 50% of the time. Needs to repeat parts of sentences. Social Interaction Social Interaction: 2-Interacts appropriately 25 - 49% of time - Needs frequent redirection. Problem Solving Problem Solving: 1-Solves basic less than 25% of the time - needs direction  nearly all the time or does not effectively solve problems and may need a restraint for safety Memory Memory: 1-Recognizes or recalls less than 25% of the time/requires cueing greater than 75% of the time  Pain Pain Assessment Pain Assessment: No/denies pain   Therapy/Group: Individual Therapy  [name] 12/20/2012, 4:27 PM

## 2012-12-20 NOTE — Progress Notes (Signed)
Patient ID: Dennis Wyatt, male   DOB: Jan 25, 1961, 52 y.o.   MRN: [id number] Subjective/Complaints: 52 y.o. right-handed male with history of hypertension. Admitted 12/03/2012 with headache and dizziness. Blood pressure noted to be 204/123. Cranial CT scan showed subarachnoid hemorrhage with concern for ruptured aneurysm. Four-vessel cerebral arteriogram showed 11 mm x 7 mm irregular right ACA A1 aneurysm. Interventional radiology consulted and patient underwent coiling of aneurysm, IVC drain. Patient did remain intubated for a time followed by critical care medicine. Latest followup cranial CT scan 12/13/2012 stable and external ventricular drain has been removed. Close monitoring of blood pressure as patient remained on nimodipine x21 day protocol and await plan to taper . He is tolerating a regular consistency diet. Patient with bouts of hyponatremia 115 and placed on Declomycin as well as sodium chloride tablets with latest followup sodium level 135 on 12/16/2012  Inappropriate with male staff Patient is sleepy this morning, arouses to verbal. States name, incorrectly states place, unable to state day or date Review of Systems  Unable to perform ROS: mental acuity    Objective: Vital Signs: Blood pressure 158/72, pulse 55, temperature 97.7 F (36.5 C), temperature source Oral, resp. rate 18, SpO2 98.00%. No results found. Results for orders placed during the hospital encounter of 12/17/12 (from the past 72 hour(s))  CLOSTRIDIUM DIFFICILE BY PCR     Status: None   Collection Time    12/19/12  6:39 PM      Result Value Range   C difficile by pcr NEGATIVE  NEGATIVE  CBC WITH DIFFERENTIAL     Status: Abnormal   Collection Time    12/20/12  5:30 AM      Result Value Range   WBC 13.3 (*) 4.0 - 10.5 K/uL   RBC 5.44  4.22 - 5.81 MIL/uL   Hemoglobin 17.7 (*) 13.0 - 17.0 g/dL   HCT 40.9  81.1 - 91.4 %   MCV 89.7  78.0 - 100.0 fL   MCH 32.5  26.0 - 34.0 pg   MCHC 36.3 (*) 30.0 - 36.0 g/dL   RDW 78.2  95.6 - 21.3 %   Platelets 184  150 - 400 K/uL   Neutrophils Relative % 77  43 - 77 %   Neutro Abs 10.3 (*) 1.7 - 7.7 K/uL   Lymphocytes Relative 12  12 - 46 %   Lymphs Abs 1.6  0.7 - 4.0 K/uL   Monocytes Relative 10  3 - 12 %   Monocytes Absolute 1.4 (*) 0.1 - 1.0 K/uL   Eosinophils Relative 0  0 - 5 %   Eosinophils Absolute 0.0  0.0 - 0.7 K/uL   Basophils Relative 0  0 - 1 %   Basophils Absolute 0.0  0.0 - 0.1 K/uL  COMPREHENSIVE METABOLIC PANEL     Status: Abnormal   Collection Time    12/20/12  5:30 AM      Result Value Range   Sodium 138  135 - 145 mEq/L   Potassium 3.8  3.5 - 5.1 mEq/L   Chloride 101  96 - 112 mEq/L   CO2 28  19 - 32 mEq/L   Glucose, Bld 93  70 - 99 mg/dL   BUN 19  6 - 23 mg/dL   Creatinine, Ser 0.86  0.50 - 1.35 mg/dL   Calcium 9.5  8.4 - 57.8 mg/dL   Total Protein 7.0  6.0 - 8.3 g/dL   Albumin 3.2 (*) 3.5 - 5.2 g/dL   AST 18  0 - 37 U/L   ALT 40  0 - 53 U/L   Alkaline Phosphatase 141 (*) 39 - 117 U/L   Total Bilirubin 0.7  0.3 - 1.2 mg/dL   GFR calc non Af Amer >90  >90 mL/min   GFR calc Af Amer >90  >90 mL/min   Comment:            The eGFR has been calculated     using the CKD EPI equation.     This calculation has not been     validated in all clinical     situations.     eGFR's persistently     <90 mL/min signify     possible Chronic Kidney Disease.      Prior drain sites closed/staples. No drainage.Eyes: EOM are normal.  Neck: Neck supple. No JVD present. No tracheal deviation present. No thyromegaly present.  Cardiovascular: Normal rate and regular rhythm. No murmurs rubs or galloops  Pulmonary/Chest: Breath sounds normal. No respiratory distress. No wheezes or rales or rhonchi  Abdominal: Soft. Bowel sounds are normal. He exhibits no distension. There is no tenderness.  Musculoskeletal: He exhibits no edema.  Lymphadenopathy:  He has no cervical adenopathy.  Neurological: He is lethargic  Patient was able to provide his  name . Patient with limited awareness of his deficits. He did follow simple commands.  He needed cues to maintain arousal. Moved all 4's with 4 to 4+/5 strength with. No gross sensory deficits. A little impulsive. No gross limb ataxia. Senses pain and LT in all 4 limbs.    Assessment/Plan: 1. Functional deficits secondary to [facility] due to ruptured ACOM aneurysm which require 3+ hours per day of interdisciplinary therapy in a comprehensive inpatient rehab setting. Physiatrist is providing close team supervision and 24 hour management of active medical problems listed below. Physiatrist and rehab team continue to assess barriers to discharge/monitor patient progress toward functional and medical goals. FIM: FIM - Bathing Bathing Steps Patient Completed: Chest;Right Arm;Left Arm;Abdomen;Front perineal area;Right upper leg;Left upper leg Bathing: 3: Mod-Patient completes 5-7 67f 10 parts or 50-74%  FIM - Upper Body Dressing/Undressing Upper body dressing/undressing steps patient completed: Thread/unthread right sleeve of pullover shirt/dresss;Thread/unthread left sleeve of pullover shirt/dress;Put head through opening of pull over shirt/dress;Pull shirt over trunk Upper body dressing/undressing: 5: Set-up assist to: Obtain clothing/put away FIM - Lower Body Dressing/Undressing Lower body dressing/undressing: 1: Total-Patient completed less than 25% of tasks  FIM - Toileting Toileting steps completed by patient: Adjust clothing prior to toileting Toileting: 2: Max-Patient completed 1 of 3 steps  FIM - Diplomatic Services operational officer Devices: Grab bars Toilet Transfers: 3-To toilet/BSC: Mod A (lift or lower assist);3-From toilet/BSC: Mod A (lift or lower assist)  FIM - Bed/Chair Transfer Bed/Chair Transfer Assistive Devices: Bed rails Bed/Chair Transfer: 4: Supine > Sit: Min A (steadying Pt. > 75%/lift 1 leg);4: Sit > Supine: Min A (steadying pt. > 75%/lift 1 leg);4: Bed > Chair or W/C:  Min A (steadying Pt. > 75%);4: Chair or W/C > Bed: Min A (steadying Pt. > 75%)  FIM - Locomotion: Wheelchair Locomotion: Wheelchair: 0: Activity did not occur FIM - Locomotion: Ambulation Locomotion: Ambulation Assistive Devices: Designer, industrial/product Ambulation/Gait Assistance: 4: Min assist Locomotion: Ambulation: 2: Travels 50 - 149 ft with minimal assistance (Pt.>75%)  Comprehension Comprehension Mode: Auditory Comprehension: 2-Understands basic 25 - 49% of the time/requires cueing 51 - 75% of the time  Expression Expression Mode: Verbal Expression: 4-Expresses basic 75 - 89%  of the time/requires cueing 10 - 24% of the time. Needs helper to occlude trach/needs to repeat words.  Social Interaction Social Interaction: 2-Interacts appropriately 25 - 49% of time - Needs frequent redirection.  Problem Solving Problem Solving: 1-Solves basic less than 25% of the time - needs direction nearly all the time or does not effectively solve problems and may need a restraint for safety  Memory Memory: 1-Recognizes or recalls less than 25% of the time/requires cueing greater than 75% of the time Medical Problem List and Plan:  1. Right SAH due to ACOM aneurysm. Status post coiling and IVC drain  2. DVT Prophylaxis/Anticoagulation: SCDs. Monitor for any signs of DVT  3. Pain Management: Oxycodone as needed. Monitor with increased mobility  4. Mood/delirium. Bed alarm for safety. Followup speech therapy for cognition  -showing improvement but has poor judgement and insight.  -is a fall risk----fall safety planning  5. Neuropsych: This patient is not capable of making decisions on his/her own behalf.  6. Hyponatremia. Resolved. Taper off Declomycin and patient remains on sodium chloride tablets. Followup chemistries during rehab stay. Wean off Na+ tabs?  7. Hypertension. Discuss plan to taper nimodipine with IR  LOS (Days) 3 A FACE TO FACE EVALUATION WAS PERFORMED  Elfrieda Espino E 12/20/2012,  9:00 AM

## 2012-12-20 NOTE — Progress Notes (Signed)
Patient information reviewed and entered into [facility] system by Sharla Tankard, RN, CRRN, PPS Coordinator.  Information including medical coding and functional independence measure will be reviewed and updated through discharge.    

## 2012-12-20 NOTE — Progress Notes (Signed)
Physical Therapy Note  Patient Details  Name: Dennis Wyatt MRN: [id number] Date of Birth: 1961-03-25 Today's Date: 12/20/2012  1100-1145 (45 minutes) individual Pain: no reported pain Focus of treatment: therapeutic exercise focused on bilateral LE strengthening/ activity tolerance; wc mobility training/endurance  Treatment: Pt up in wc with max vcs to unlock brakes; wc mobility -min assist 120 feet X 2 with mod/max  vcs to use LT hand for propulsion (steers to right); transfer stand/turn RW  Min assist ; Nustep level 4 X 10 minutes for bilateral LE strengthening/activity tolerance; standing therapeutic exercises X 15 -  Up/down 4 inch step alternating LEs for quad strengthening; stepping up to 12 inch height alternating LEs for hip flexor strengthening;  Burnham Trost,JIM 12/20/2012, 11:20 AM

## 2012-12-21 ENCOUNTER — Inpatient Hospital Stay (HOSPITAL_COMMUNITY): Payer: No Typology Code available for payment source | Admitting: Speech Pathology

## 2012-12-21 ENCOUNTER — Inpatient Hospital Stay (HOSPITAL_COMMUNITY): Payer: No Typology Code available for payment source | Admitting: Physical Therapy

## 2012-12-21 ENCOUNTER — Inpatient Hospital Stay (HOSPITAL_COMMUNITY): Payer: No Typology Code available for payment source

## 2012-12-21 LAB — URINALYSIS, ROUTINE W REFLEX MICROSCOPIC
Bilirubin Urine: NEGATIVE
Glucose, UA: NEGATIVE mg/dL
Hgb urine dipstick: NEGATIVE
Ketones, ur: NEGATIVE mg/dL
Leukocytes, UA: NEGATIVE
Nitrite: NEGATIVE
Protein, ur: NEGATIVE mg/dL
Specific Gravity, Urine: 1.021 (ref 1.005–1.030)
Urobilinogen, UA: 0.2 mg/dL (ref 0.0–1.0)
pH: 5.5 (ref 5.0–8.0)

## 2012-12-21 MED ORDER — QUETIAPINE FUMARATE 25 MG PO TABS
25.0000 mg | ORAL_TABLET | Freq: Every day | ORAL | Status: DC
  Administered 2012-12-22 – 2012-12-31 (×11): 25 mg via ORAL
  Filled 2012-12-21 (×13): qty 1

## 2012-12-21 MED ORDER — OXYCODONE HCL 5 MG PO TABS
5.0000 mg | ORAL_TABLET | ORAL | Status: DC | PRN
  Administered 2012-12-21 – 2012-12-23 (×6): 5 mg via ORAL
  Filled 2012-12-21 (×5): qty 1

## 2012-12-21 MED ORDER — METHYLPHENIDATE HCL 5 MG PO TABS
5.0000 mg | ORAL_TABLET | Freq: Two times a day (BID) | ORAL | Status: DC
  Administered 2012-12-22 – 2012-12-31 (×20): 5 mg via ORAL
  Filled 2012-12-21 (×20): qty 1

## 2012-12-21 NOTE — Progress Notes (Signed)
Physical Therapy Session Note  Patient Details  Name: Dennis Wyatt MRN: [id number] Date of Birth: [birth year]  Today's Date: 12/21/2012 Time: 1035-1140 Time Calculation (min): 65 min  Short Term Goals: Week 1:     Skilled Therapeutic Interventions/Progress Updates:    Pt in w/c at initiation of treatment session with pt's brother and niece present.  Pt reports painscore of 4/10 in hips and LB region states it is from a broken hip as a child, pt also c/o headache approximately 4/10. Pt appeared pleasant and motivated and oriented to person. Pt seen for wheelchair mobility with use of B UEs for 145 feet with verbal cues to improve turns and maneuvering, gait training with RW with mod assist  Extra helper with wheelchair behind for safety with mod to max vcs to improve advancement and placement of AD and to improve upright posture, pt tends to allow walker to get too far in front and displays forward flexed posture. Pt does fatigue and occasionally with need to sit unexpectedly. Pt performed Nu-step x 5 mins at level 3 and 5 mins at level 5 with pt demonstrating some fatigue and requesting brief rest period, standing static/dynamic balance activities with short term memory tasks with pt able to tolerate approximately 3 mins of static standing and demonstrating good recall of memory tasks, intermittent confusion noted with time and place. Pt navigated 14 steps with use of 2 handrails and min assist with pt demonstrating slight LOB with turns and suddenly needing to sit due to fatigue requiring increased vcs to improve safety. Pt performed various transfers including toilet transfer w/c to toilet with min assist and mod vcs for hand and foot placement, total assist with incontinence product and pt able to donn brief and shorts with therapist assist to straighten out.    Therapy Documentation Precautions:  Precautions Precautions: Fall Precaution Comments: confused, disoriented, light  headed Restrictions Weight Bearing Restrictions: No       Pain: Pain Assessment Pain Assessment: No/denies pain Pain Score:   4 Pain Intervention(s): Repositioned    Locomotion : Ambulation Ambulation/Gait Assistance: 3: Mod assist  :           See FIM for current functional status  Therapy/Group: Individual Therapy  [name] 12/21/2012, 12:08 PM

## 2012-12-21 NOTE — Progress Notes (Signed)
Physical Therapy Session Note  Patient Details  Name: Dennis Wyatt MRN: [id number] Date of Birth: 20-Dec-1960  Today's Date: 12/21/2012 Time: 1300-1330 Time Calculation (min): 30 min  Short Term Goals: Week 1:     Skilled Therapeutic Interventions/Progress Updates:    Second session: Upon initiation of treatment pt upright in w/c. Pt performed gait training for 180 feet and 125 feet with w/c behind with +1 min assist with intermittent mod assist with fatigue, improved navigation of walker and improved gait pattern noted this session.  Pt also performed transfers to various surfaces with use of RW with pt demonstrating increased difficulty from lower surfaces and without armrests, requires mod to max vcs to improve foot and hand placement and sequencing.   Therapy Documentation Precautions:  Precautions Precautions: Fall Precaution Comments: confused, disoriented, light headed Restrictions Weight Bearing Restrictions: No General:   Pain: Pain Assessment Pain Assessment: Pt reports 4/10 pain score in B hips and LB.        :           See FIM for current functional status  Therapy/Group: Individual Therapy  [name] 12/21/2012, 4:11 PM

## 2012-12-21 NOTE — Progress Notes (Signed)
Pt c/o increased back pain today, grimaces; h/.o chronic pain.  Oxy IR 10mg  causing increased drowsiness; PAC aware,order obtained for 5 OXY, K-pad obtained.  Monitor.

## 2012-12-21 NOTE — Patient Care Conference (Signed)
Inpatient RehabilitationTeam Conference and Plan of Care Update Date: 12/21/2012   Time: 3:05 PM    Patient Name: Dennis Wyatt      Medical Record Number: [id number]  Date of Birth: [birth year] Sex: Male         Room/Bed: 4027/4027-01 Payor Info: Payor: COVENTRY / Plan: COVENTRY / Product Type: *No Product type* /    Admitting Diagnosis: SAH from ANEURYSM  Admit Date/Time:  12/17/2012  3:32 PM Admission Comments: No comment available   Primary Diagnosis:  SAH (subarachnoid hemorrhage) Principal Problem: SAH (subarachnoid hemorrhage)  Patient Active Problem List   Diagnosis Date Noted  . SAH (subarachnoid hemorrhage) 12/20/2012  . Hyponatremia 12/13/2012  . Hyperglycemia 12/06/2012  . Subarachnoid hemorrhage from anterior communicating artery aneurysm 12/05/2012  . Anterior communicating artery aneurysm 12/05/2012  . Acute respiratory failure 12/03/2012  . Malignant hypertension 12/03/2012  . Altered mental status 12/03/2012    Expected Discharge Date: Expected Discharge Date: 01/01/13  Team Members Present: Physician leading conference: Dr. Claudette Laws Social Worker Present: Amada Jupiter, LCSW Nurse Present: Carlean Purl, RN PT Present: Reggy Eye, PT OT Present: Ardis Rowan, Corky Crafts, OT SLP Present: Feliberto Gottron, SLP Other (Discipline and Name): Tora Duck, PPS Coordinator and Ottie Glazier, RN     Current Status/Progress Goal Weekly Team Focus  Medical   poor sleep, fluctuating awareness  improve alertness and sleep  medication management   Bowel/Bladder   Urgency and incontinence with bowel and bladder; LBM 12/20/12  Manage with briefs and q2hr timed toileting during the day  Q2hour toileting   Swallow/Nutrition/ Hydration             ADL's   min A/mod A overall BADL, impulsive, decreased awareness  supervision overall  incrased awareness, attention, functional ambulation, dynamic standing balance   Mobility   min/mod assist transfers and  ambulation  supervision  Awareness, safety with mobility, standing balance + cognitive challenges, attention, family education   Communication   Mod A  Supervision  utilization of word-finding strategies   Safety/Cognition/ Behavioral Observations  Max-total A  Min A  attention, awareness, working memory with use of compensatory strategies    Pain   Pain in left hip and headache; controlled with tylenol and oxy IR  </=3      Skin   Staple to right head, blanchable redness to buttocks  No new skin breakdown       Rehab Goals Patient on target to meet rehab goals: Yes *See Care Plan and progress notes for long and short-term goals.  Barriers to Discharge: see above, poor safety    Possible Resolutions to Barriers:  see above, train caregiver    Discharge Planning/Teaching Needs:  Plan for pt to d/c to either his own home or home with brother and neice to provide 24/7 assistance.      Team Discussion:  Decreased awareness overall i.e no awareness of incontinence.  Appears very fatigued throughout day.  Not sleeping well at night - MD to make med changes.  Very impulsive and cannot be left without belt.  Anticipate good physical gains with cognition changes to be major factor in caregiver demands.  Revisions to Treatment Plan:  None    Continued Need for Acute Rehabilitation Level of Care: The patient requires daily medical management by a physician with specialized training in physical medicine and rehabilitation for the following conditions: Daily direction of a multidisciplinary physical rehabilitation program to ensure safe treatment while eliciting the highest outcome that is of  practical value to the patient.: Yes Daily medical management of patient stability for increased activity during participation in an intensive rehabilitation regime.: Yes Daily analysis of laboratory values and/or radiology reports with any subsequent need for medication adjustment of medical intervention  for : Neurological problems;Post surgical problems  Dennis Wyatt 12/21/2012, 4:50 PM

## 2012-12-21 NOTE — Progress Notes (Signed)
Speech Language Pathology Daily Session Note  Patient Details  Name: Dennis Wyatt MRN: [id number] Date of Birth: March 26, 1961  Today's Date: 12/21/2012 Time: 0930-1000 Time Calculation (min): 30 min  Short Term Goals: Week 1: SLP Short Term Goal 1 (Week 1): Patient will sustain attention to familiar task for 10 minutes with Mod assist vebral cues for re-direction. SLP Short Term Goal 2 (Week 1): Patient will utilize external aids to assist with orientation with Mod assist question cues. SLP Short Term Goal 3 (Week 1): Patient will verbalize 3 deficits that impact his ability to safely complete basic ADLs with Mod question cues. SLP Short Term Goal 4 (Week 1): Patient will demonstrate basic problem solving with Mod assist clinician cues. SLP Short Term Goal 5 (Week 1): Patient will utilize word finding strategies during structured tasks with Mod assist verbal cues.  Skilled Therapeutic Interventions: Treatment focus on cognitive goals. SLP facilitated session by providing Max A question and visual cues for orientation to place, time and situation. Pt continues to demonstrate generalized confusion with intermittent confabulation and requires total A multimodal cueing to recall previous events of the day. Pt provided visual aid of a timeline of hospital events to increase recall and intellectual awareness of events. Pt also participated in 4 step sequencing picture card task and required Mod A question and visual cues for problem solving with the task.    FIM:  Comprehension Comprehension Mode: Auditory Comprehension: 2-Understands basic 25 - 49% of the time/requires cueing 51 - 75% of the time Expression Expression Mode: Verbal Expression: 3-Expresses basic 50 - 74% of the time/requires cueing 25 - 50% of the time. Needs to repeat parts of sentences. Social Interaction Social Interaction: 2-Interacts appropriately 25 - 49% of time - Needs frequent redirection. Problem Solving Problem  Solving: 1-Solves basic less than 25% of the time - needs direction nearly all the time or does not effectively solve problems and may need a restraint for safety Memory Memory: 1-Recognizes or recalls less than 25% of the time/requires cueing greater than 75% of the time  Pain Pain Assessment Pain Assessment: No/denies pain  Therapy/Group: Individual Therapy  [name] 12/21/2012, 3:52 PM

## 2012-12-21 NOTE — Progress Notes (Signed)
Occupational Therapy Session Note  Patient Details  Name: Dennis Wyatt MRN: [id number] Date of Birth: June 09, 1961  Today's Date: 12/21/2012 Time: 0805-0905 Time Calculation (min): 60 min  Short Term Goals: Week 1:  OT Short Term Goal 1 (Week 1): Patient will identify need to void, and wait for assistance to arrive OT Short Term Goal 2 (Week 1): Patient will transfer to toilet with close supervision OT Short Term Goal 3 (Week 1): Patient will dress lower body with min assist OT Short Term Goal 4 (Week 1): Patient will complete steps of grooming tasks with only initial verbal directive, or environmental cue  Skilled Therapeutic Interventions/Progress Updates:    Pt in bed upon arrival and agreeable to bathing at shower level and dressing with sit<>stand from w/c at sink.  Pt amb with RW to bathroom to use toilet before transfer to shower seat.  Pt incontinent of bowel with no awareness. Pt required steady A while standing and exhibited decreased problem solving in shower.  Pt asked if he was at [facility] while taking shower.  Pt reoriented with one questioning verbal cue.  Pt transferred to w/c before returning to room to complete dressing.  Pt completed all dressing tasks with steady A only while standing.  Focus on task initiation, orientation, attention to task, standing balance, functional ambulation, and safety awareness.  Therapy Documentation Precautions:  Precautions Precautions: Fall Precaution Comments: confused, disoriented, light headed Restrictions Weight Bearing Restrictions: No   Pain: Pain Assessment Pain Assessment: No/denies pain  See FIM for current functional status  Therapy/Group: Individual Therapy  Shihab, States 12/21/2012, 9:59 AM

## 2012-12-21 NOTE — Progress Notes (Signed)
Patient ID: Dennis Wyatt, male   DOB: [birth year], 52 y.o.   MRN: [id number] Subjective/Complaints: 52 y.o. right-handed male with history of hypertension. Admitted 12/03/2012 with headache and dizziness. Blood pressure noted to be 204/123. Cranial CT scan showed subarachnoid hemorrhage with concern for ruptured aneurysm. Four-vessel cerebral arteriogram showed 11 mm x 7 mm irregular right ACA A1 aneurysm. Interventional radiology consulted and patient underwent coiling of aneurysm, IVC drain. Patient did remain intubated for a time followed by critical care medicine. Latest followup cranial CT scan 12/13/2012 stable and external ventricular drain has been removed. Close monitoring of blood pressure as patient remained on nimodipine x21 day protocol and await plan to taper . He is tolerating a regular consistency diet. Patient with bouts of hyponatremia 115 and placed on Declomycin as well as sodium chloride tablets with latest followup sodium level 135 on 12/16/2012  Patient is sleepy this morning, arouses to verbal. States name, incorrectly states place, unable to state day or date Review of Systems  Unable to perform ROS: mental acuity    Objective: Vital Signs: Blood pressure 116/58, pulse 57, temperature 97.6 F (36.4 C), temperature source Oral, resp. rate 19, SpO2 95.00%. No results found. Results for orders placed during the hospital encounter of 12/17/12 (from the past 72 hour(s))  CLOSTRIDIUM DIFFICILE BY PCR     Status: None   Collection Time    12/19/12  6:39 PM      Result Value Range   C difficile by pcr NEGATIVE  NEGATIVE  CBC WITH DIFFERENTIAL     Status: Abnormal   Collection Time    12/20/12  5:30 AM      Result Value Range   WBC 13.3 (*) 4.0 - 10.5 K/uL   RBC 5.44  4.22 - 5.81 MIL/uL   Hemoglobin 17.7 (*) 13.0 - 17.0 g/dL   HCT 44.0  34.7 - 42.5 %   MCV 89.7  78.0 - 100.0 fL   MCH 32.5  26.0 - 34.0 pg   MCHC 36.3 (*) 30.0 - 36.0 g/dL   RDW 95.6  38.7 - 56.4 %   Platelets 184  150 - 400 K/uL   Neutrophils Relative % 77  43 - 77 %   Neutro Abs 10.3 (*) 1.7 - 7.7 K/uL   Lymphocytes Relative 12  12 - 46 %   Lymphs Abs 1.6  0.7 - 4.0 K/uL   Monocytes Relative 10  3 - 12 %   Monocytes Absolute 1.4 (*) 0.1 - 1.0 K/uL   Eosinophils Relative 0  0 - 5 %   Eosinophils Absolute 0.0  0.0 - 0.7 K/uL   Basophils Relative 0  0 - 1 %   Basophils Absolute 0.0  0.0 - 0.1 K/uL  COMPREHENSIVE METABOLIC PANEL     Status: Abnormal   Collection Time    12/20/12  5:30 AM      Result Value Range   Sodium 138  135 - 145 mEq/L   Potassium 3.8  3.5 - 5.1 mEq/L   Chloride 101  96 - 112 mEq/L   CO2 28  19 - 32 mEq/L   Glucose, Bld 93  70 - 99 mg/dL   BUN 19  6 - 23 mg/dL   Creatinine, Ser 3.32  0.50 - 1.35 mg/dL   Calcium 9.5  8.4 - 95.1 mg/dL   Total Protein 7.0  6.0 - 8.3 g/dL   Albumin 3.2 (*) 3.5 - 5.2 g/dL   AST 18  0 - 37  U/L   ALT 40  0 - 53 U/L   Alkaline Phosphatase 141 (*) 39 - 117 U/L   Total Bilirubin 0.7  0.3 - 1.2 mg/dL   GFR calc non Af Amer >90  >90 mL/min   GFR calc Af Amer >90  >90 mL/min   Comment:            The eGFR has been calculated     using the CKD EPI equation.     This calculation has not been     validated in all clinical     situations.     eGFR's persistently     <90 mL/min signify     possible Chronic Kidney Disease.      Prior drain sites closed/staples. No drainage.Eyes: EOM are normal.  Neck: Neck supple. No JVD present. No tracheal deviation present. No thyromegaly present.  Cardiovascular: Normal rate and regular rhythm. No murmurs rubs or galloops  Pulmonary/Chest: Breath sounds normal. No respiratory distress. No wheezes or rales or rhonchi  Abdominal: Soft. Bowel sounds are normal. He exhibits no distension. There is no tenderness.  Musculoskeletal: He exhibits no edema.  Lymphadenopathy:  He has no cervical adenopathy.  Neurological: He is lethargic  Patient was able to provide his name . Patient with limited  awareness of his deficits. He did follow simple commands.  He needed cues to maintain arousal. Moved all 4's with 4 to 4+/5 strength with. No gross sensory deficits. A little impulsive. No gross limb ataxia. Senses pain and LT in all 4 limbs.    Assessment/Plan: 1. Functional deficits secondary to [facility] due to ruptured ACOM aneurysm which require 3+ hours per day of interdisciplinary therapy in a comprehensive inpatient rehab setting. Physiatrist is providing close team supervision and 24 hour management of active medical problems listed below. Physiatrist and rehab team continue to assess barriers to discharge/monitor patient progress toward functional and medical goals. FIM: FIM - Bathing Bathing Steps Patient Completed: Chest;Right Arm;Left Arm;Abdomen;Front perineal area;Buttocks;Right upper leg;Left upper leg Bathing: 4: Min-Patient completes 8-9 57f 10 parts or 75+ percent  FIM - Upper Body Dressing/Undressing Upper body dressing/undressing steps patient completed: Thread/unthread right sleeve of pullover shirt/dresss;Thread/unthread left sleeve of pullover shirt/dress;Put head through opening of pull over shirt/dress;Pull shirt over trunk Upper body dressing/undressing: 5: Set-up assist to: Obtain clothing/put away FIM - Lower Body Dressing/Undressing Lower body dressing/undressing steps patient completed: Thread/unthread right pants leg;Thread/unthread left pants leg;Pull pants up/down;Don/Doff right sock;Don/Doff left sock;Don/Doff left shoe Lower body dressing/undressing: 4: Min-Patient completed 75 plus % of tasks  FIM - Toileting Toileting steps completed by patient: Adjust clothing prior to toileting;Performs perineal hygiene;Adjust clothing after toileting Toileting: 4: Steadying assist  FIM - Diplomatic Services operational officer Devices: Elevated toilet seat Toilet Transfers: 4-To toilet/BSC: Min A (steadying Pt. > 75%);4-From toilet/BSC: Min A (steadying Pt. > 75%)  FIM  - Bed/Chair Transfer Bed/Chair Transfer Assistive Devices: Bed rails Bed/Chair Transfer: 5: Supine > Sit: Supervision (verbal cues/safety issues);4: Bed > Chair or W/C: Min A (steadying Pt. > 75%);4: Chair or W/C > Bed: Min A (steadying Pt. > 75%)  FIM - Locomotion: Wheelchair Locomotion: Wheelchair: 5: Travels 150 ft or more: maneuvers on rugs and over door sills with supervision, cueing or coaxing FIM - Locomotion: Ambulation Locomotion: Ambulation Assistive Devices: Designer, industrial/product Ambulation/Gait Assistance: 2: Max assist Locomotion: Ambulation: 1: Travels less than 50 ft with maximal assistance (Pt: 25 - 49%)  Comprehension Comprehension Mode: Auditory Comprehension: 2-Understands basic 25 -  49% of the time/requires cueing 51 - 75% of the time  Expression Expression Mode: Verbal Expression: 3-Expresses basic 50 - 74% of the time/requires cueing 25 - 50% of the time. Needs to repeat parts of sentences.  Social Interaction Social Interaction: 2-Interacts appropriately 25 - 49% of time - Needs frequent redirection.  Problem Solving Problem Solving: 1-Solves basic less than 25% of the time - needs direction nearly all the time or does not effectively solve problems and may need a restraint for safety  Memory Memory: 1-Recognizes or recalls less than 25% of the time/requires cueing greater than 75% of the time Medical Problem List and Plan:  1. Right SAH due to ACOM aneurysm. Status post coiling and IVC drain  2. DVT Prophylaxis/Anticoagulation: SCDs. Monitor for any signs of DVT  3. Pain Management: Oxycodone as needed. Monitor with increased mobility  4. Mood/delirium. Bed alarm for safety. Followup speech therapy for cognition  -showing improvement but has poor judgement and insight.  -is a fall risk----fall safety planning  5. Neuropsych: This patient is not capable of making decisions on his/her own behalf.  6. Hyponatremia. Resolved. Taper off Declomycin and patient remains  on sodium chloride tablets. Followup chemistries during rehab stay. Wean off Na+ tabs?  7. Hypertension. Discuss plan to taper nimodipine with IR 8.  Leukocytosis without fever or symptoms, will check UA, monitor CBC for trend LOS (Days) 4 A FACE TO FACE EVALUATION WAS PERFORMED  Tracina Beaumont E 12/21/2012, 9:49 AM

## 2012-12-22 ENCOUNTER — Inpatient Hospital Stay (HOSPITAL_COMMUNITY): Payer: No Typology Code available for payment source | Admitting: Physical Therapy

## 2012-12-22 ENCOUNTER — Inpatient Hospital Stay (HOSPITAL_COMMUNITY): Payer: No Typology Code available for payment source | Admitting: Speech Pathology

## 2012-12-22 ENCOUNTER — Inpatient Hospital Stay (HOSPITAL_COMMUNITY): Payer: No Typology Code available for payment source

## 2012-12-22 ENCOUNTER — Encounter (HOSPITAL_COMMUNITY): Payer: No Typology Code available for payment source | Admitting: Occupational Therapy

## 2012-12-22 DIAGNOSIS — I609 Nontraumatic subarachnoid hemorrhage, unspecified: Secondary | ICD-10-CM

## 2012-12-22 DIAGNOSIS — I10 Essential (primary) hypertension: Secondary | ICD-10-CM

## 2012-12-22 LAB — CBC WITH DIFFERENTIAL/PLATELET
Basophils Absolute: 0.1 10*3/uL (ref 0.0–0.1)
Basophils Relative: 1 % (ref 0–1)
Eosinophils Absolute: 0.2 10*3/uL (ref 0.0–0.7)
Eosinophils Relative: 2 % (ref 0–5)
HCT: 52.3 % — ABNORMAL HIGH (ref 39.0–52.0)
Hemoglobin: 17.8 g/dL — ABNORMAL HIGH (ref 13.0–17.0)
Lymphocytes Relative: 27 % (ref 12–46)
Lymphs Abs: 2.5 10*3/uL (ref 0.7–4.0)
MCH: 31.8 pg (ref 26.0–34.0)
MCHC: 34 g/dL (ref 30.0–36.0)
MCV: 93.4 fL (ref 78.0–100.0)
Monocytes Absolute: 0.9 10*3/uL (ref 0.1–1.0)
Monocytes Relative: 10 % (ref 3–12)
Neutro Abs: 5.7 10*3/uL (ref 1.7–7.7)
Neutrophils Relative %: 62 % (ref 43–77)
Platelets: 176 10*3/uL (ref 150–400)
RBC: 5.6 MIL/uL (ref 4.22–5.81)
RDW: 12.9 % (ref 11.5–15.5)
WBC: 9.3 10*3/uL (ref 4.0–10.5)

## 2012-12-22 NOTE — Progress Notes (Signed)
Physical Therapy Session Note  Patient Details  Name: Dennis Wyatt MRN: [id number] Date of Birth: 17-Apr-1961  Today's Date: 12/22/2012 Time: 0800-0900             1pm-130pm Time Calculation (min): 60 min                                              30 min  Short Term Goals: Week 1:  PT Short Term Goal 1 (Week 1): Pt will ambulate x 100' with moderate assist of one person PT Short Term Goal 2 (Week 1): Pt will tolerate 3 min continuous standing activity with min assist PT Short Term Goal 3 (Week 1): Pt will demonstrate sustained attention for >/= 5 min continuous.   Skilled Therapeutic Interventions/Progress Updates:   First Session:   Pt sitting up in bed upon initiation eating breakfast appeared alert and oriented. Pt performed bed mobility supine to sit with supervision asssit, sit EOB with increased c/o HA and dizziness. BP taken in long sitting in bed after pt requested lying back down BP 162/84, nsg consulted and pt rec'd medications. Pt ambulated to bathroom with min assist with RW with mod vcs to improve upright posture and maneuvering of RW to get through door and into bathroom, pt toileted bladder and bowel and was able to perform personal hygiene with supervision assist just to check for thoroughness, donning of brief max assist and to pull up pants min to mod assist.. Pt performed standing balance at sink to wash hands and in front of wheelchair for reaching activities with RW in front. Pt seen for gait training with RW with w/c behind and min assist for 175' x2 with seated rest.Pt seen for cognitive and memory training throughout treatment for short term memory and identifying place and time as pt initially thought it was 3pm, referenced to clock to orient pt. Pt displays improving short term memory and is able to recall safety with wheelchair 2 out of 4 trials, will continue to require reinforcement to improve.  Second Session:    Pt continues to c/o acute HA pain 8-9/10, nsg notified  and pt rec'd medication. Pt performed short term memory recall on safety with all mobility in and around room with use of w/c and RW, pt slightly more distracted this afternoon as compared to morning session. Requires mod vcs to improve safety and sequencing for transfers and functional mobility tends to be impulsive with sitting.  Pt was able to recall and be orientated to date, time and place without prompting located calendar in room to determine date at beginning of session.  Pt ambulated 165' x 2 with 1 seated rest min assist and wheelchair behind. Pt c/o fatigue and performed stand pivot transfer to bed with CGA and displayed slight increased confusion stating today was Sunday.  Pt is improving and progressing towards stated goals.  Therapy Documentation Precautions:  Precautions Precautions: Fall Precaution Comments: confused, disoriented, light headed Restrictions Weight Bearing Restrictions: No    Vital Signs: Therapy Vitals Pulse Rate: 92 BP: 162/84 mmHg Patient Position, if appropriate: long sitting on bed Pain: Pain Assessment Pain Assessment: 0-10 Pain Score:   4/10 hips and LB, 5-9/10 c/o headache Pain Type: Acute pain Pain Location: Head Pain Orientation: Anterior Pain Descriptors / Indicators: Headache Patients Stated Pain Goal: 2 Pain Intervention(s): RN made aware        :  See FIM for current functional status  Therapy/Group: Individual Therapy  [name] 12/22/2012, 12:24 PM

## 2012-12-22 NOTE — Progress Notes (Signed)
Social Work Patient ID: Dennis Wyatt, male   DOB: 1961/05/09, 52 y.o.   MRN: [id number]  Met with pt and niece today to review team conference.  Both aware and agreeable with targeted d/c date of 6/21 with supervision goals.  Niece has attended therapies the past couple of days and states, "I've seen a big improvement" with pt's overall function.  She notes plans will probably be for pt to d/c to his brother's home and she will definitely by the person who will provide the supervision.  Will continue to follow for support and d/c planning.  Dennis Berrios, LCSW

## 2012-12-22 NOTE — Progress Notes (Signed)
Speech Language Pathology Daily Session Note  Patient Details  Name: Dennis Wyatt MRN: [id number] Date of Birth: Apr 04, 1961  Today's Date: 12/22/2012 Time: 4098-1191 Time Calculation (min): 45 min  Short Term Goals: Week 1: SLP Short Term Goal 1 (Week 1): Patient will sustain attention to familiar task for 10 minutes with Mod assist vebral cues for re-direction. SLP Short Term Goal 2 (Week 1): Patient will utilize external aids to assist with orientation with Mod assist question cues. SLP Short Term Goal 3 (Week 1): Patient will verbalize 3 deficits that impact his ability to safely complete basic ADLs with Mod question cues. SLP Short Term Goal 4 (Week 1): Patient will demonstrate basic problem solving with Mod assist clinician cues. SLP Short Term Goal 5 (Week 1): Patient will utilize word finding strategies during structured tasks with Mod assist verbal cues.  Skilled Therapeutic Interventions: Treatment focus on cognitive goals. SLP facilitated session by providing Min A question and visual cues for orientation to place, time and situation. Pt continues to demonstrate generalized confusion with intermittent confabulation and requires total A multimodal for topic maintenance during functional conversation. Pt also required Total A visual and verbal cues to recall previous therapy sessions and for utilization of visual aids to increase recall. Pt completed 4 step sequencing picture card task with Mod A question and visual cues for problem solving with the task.   FIM:  Comprehension Comprehension Mode: Auditory Comprehension: 3-Understands basic 50 - 74% of the time/requires cueing 25 - 50%  of the time Expression Expression Mode: Verbal Expression: 3-Expresses basic 50 - 74% of the time/requires cueing 25 - 50% of the time. Needs to repeat parts of sentences. Social Interaction Social Interaction: 3-Interacts appropriately 50 - 74% of the time - May be physically or verbally  inappropriate. Problem Solving Problem Solving: 1-Solves basic less than 25% of the time - needs direction nearly all the time or does not effectively solve problems and may need a restraint for safety Memory Memory: 1-Recognizes or recalls less than 25% of the time/requires cueing greater than 75% of the time FIM - Eating Eating Activity: 5: Set-up assist for open containers  Pain Pain Assessment Pain Assessment: No/denies pain   Therapy/Group: Individual Therapy  Brunetta Newingham 12/22/2012, 4:54 PM

## 2012-12-22 NOTE — Progress Notes (Signed)
Patient ID: Dennis Wyatt, male   DOB: 1961/03/08, 52 y.o.   MRN: [id number] Subjective/Complaints: 52 y.o. right-handed male with history of hypertension. Admitted 12/03/2012 with headache and dizziness. Blood pressure noted to be 204/123. Cranial CT scan showed subarachnoid hemorrhage with concern for ruptured aneurysm. Four-vessel cerebral arteriogram showed 11 mm x 7 mm irregular right ACA A1 aneurysm. Interventional radiology consulted and patient underwent coiling of aneurysm, IVC drain. Patient did remain intubated for a time followed by critical care medicine. Latest followup cranial CT scan 12/13/2012 stable and external ventricular drain has been removed. Close monitoring of blood pressure as patient remained on nimodipine x21 day protocol and await plan to taper . He is tolerating a regular consistency diet. Patient with bouts of hyponatremia 115 and placed on Declomycin as well as sodium chloride tablets with latest followup sodium level 135 on 12/16/2012  UP AN ALERT. DENIES PAIN EXCEPT FOR HEADACHE Review of Systems  Unable to perform ROS: mental acuity    Objective: Vital Signs: Blood pressure 145/76, pulse 60, temperature 98.1 F (36.7 C), temperature source Oral, resp. rate 17, SpO2 97.00%. No results found. Results for orders placed during the hospital encounter of 12/17/12 (from the past 72 hour(s))  CLOSTRIDIUM DIFFICILE BY PCR     Status: None   Collection Time    12/19/12  6:39 PM      Result Value Range   C difficile by pcr NEGATIVE  NEGATIVE  CBC WITH DIFFERENTIAL     Status: Abnormal   Collection Time    12/20/12  5:30 AM      Result Value Range   WBC 13.3 (*) 4.0 - 10.5 K/uL   RBC 5.44  4.22 - 5.81 MIL/uL   Hemoglobin 17.7 (*) 13.0 - 17.0 g/dL   HCT 78.4  69.6 - 29.5 %   MCV 89.7  78.0 - 100.0 fL   MCH 32.5  26.0 - 34.0 pg   MCHC 36.3 (*) 30.0 - 36.0 g/dL   RDW 28.4  13.2 - 44.0 %   Platelets 184  150 - 400 K/uL   Neutrophils Relative % 77  43 - 77 %   Neutro  Abs 10.3 (*) 1.7 - 7.7 K/uL   Lymphocytes Relative 12  12 - 46 %   Lymphs Abs 1.6  0.7 - 4.0 K/uL   Monocytes Relative 10  3 - 12 %   Monocytes Absolute 1.4 (*) 0.1 - 1.0 K/uL   Eosinophils Relative 0  0 - 5 %   Eosinophils Absolute 0.0  0.0 - 0.7 K/uL   Basophils Relative 0  0 - 1 %   Basophils Absolute 0.0  0.0 - 0.1 K/uL  COMPREHENSIVE METABOLIC PANEL     Status: Abnormal   Collection Time    12/20/12  5:30 AM      Result Value Range   Sodium 138  135 - 145 mEq/L   Potassium 3.8  3.5 - 5.1 mEq/L   Chloride 101  96 - 112 mEq/L   CO2 28  19 - 32 mEq/L   Glucose, Bld 93  70 - 99 mg/dL   BUN 19  6 - 23 mg/dL   Creatinine, Ser 1.02  0.50 - 1.35 mg/dL   Calcium 9.5  8.4 - 72.5 mg/dL   Total Protein 7.0  6.0 - 8.3 g/dL   Albumin 3.2 (*) 3.5 - 5.2 g/dL   AST 18  0 - 37 U/L   ALT 40  0 - 53 U/L  Alkaline Phosphatase 141 (*) 39 - 117 U/L   Total Bilirubin 0.7  0.3 - 1.2 mg/dL   GFR calc non Af Amer >90  >90 mL/min   GFR calc Af Amer >90  >90 mL/min   Comment:            The eGFR has been calculated     using the CKD EPI equation.     This calculation has not been     validated in all clinical     situations.     eGFR's persistently     <90 mL/min signify     possible Chronic Kidney Disease.  URINALYSIS, ROUTINE W REFLEX MICROSCOPIC     Status: None   Collection Time    12/21/12  6:01 PM      Result Value Range   Color, Urine YELLOW  YELLOW   APPearance CLEAR  CLEAR   Specific Gravity, Urine 1.021  1.005 - 1.030   pH 5.5  5.0 - 8.0   Glucose, UA NEGATIVE  NEGATIVE mg/dL   Hgb urine dipstick NEGATIVE  NEGATIVE   Bilirubin Urine NEGATIVE  NEGATIVE   Ketones, ur NEGATIVE  NEGATIVE mg/dL   Protein, ur NEGATIVE  NEGATIVE mg/dL   Urobilinogen, UA 0.2  0.0 - 1.0 mg/dL   Nitrite NEGATIVE  NEGATIVE   Leukocytes, UA NEGATIVE  NEGATIVE   Comment: MICROSCOPIC NOT DONE ON URINES WITH NEGATIVE PROTEIN, BLOOD, LEUKOCYTES, NITRITE, OR GLUCOSE <1000 mg/dL.  CBC WITH DIFFERENTIAL      Status: Abnormal   Collection Time    12/22/12  5:55 AM      Result Value Range   WBC 9.3  4.0 - 10.5 K/uL   RBC 5.60  4.22 - 5.81 MIL/uL   Hemoglobin 17.8 (*) 13.0 - 17.0 g/dL   HCT 78.2 (*) 95.6 - 21.3 %   MCV 93.4  78.0 - 100.0 fL   MCH 31.8  26.0 - 34.0 pg   MCHC 34.0  30.0 - 36.0 g/dL   RDW 08.6  57.8 - 46.9 %   Platelets 176  150 - 400 K/uL   Neutrophils Relative % 62  43 - 77 %   Neutro Abs 5.7  1.7 - 7.7 K/uL   Lymphocytes Relative 27  12 - 46 %   Lymphs Abs 2.5  0.7 - 4.0 K/uL   Monocytes Relative 10  3 - 12 %   Monocytes Absolute 0.9  0.1 - 1.0 K/uL   Eosinophils Relative 2  0 - 5 %   Eosinophils Absolute 0.2  0.0 - 0.7 K/uL   Basophils Relative 1  0 - 1 %   Basophils Absolute 0.1  0.0 - 0.1 K/uL      Prior drain sites closed/staples. No drainage.Eyes: EOM are normal.  Neck: Neck supple. No JVD present. No tracheal deviation present. No thyromegaly present.  Cardiovascular: Normal rate and regular rhythm. No murmurs rubs or galloops  Pulmonary/Chest: Breath sounds normal. No respiratory distress. No wheezes or rales or rhonchi  Abdominal: Soft. Bowel sounds are normal. He exhibits no distension. There is no tenderness.  Musculoskeletal: He exhibits no edema.  Lymphadenopathy:  He has no cervical adenopathy.  Neurological: He is lethargic  Patient was able to provide his name . Patient with limited awareness of his deficits. He did follow simple commands.  He needed cues to maintain arousal. Moved all 4's with 4 to 4+/5 strength with. No gross sensory deficits. A little impulsive. No gross limb ataxia.  Senses pain and LT in all 4 limbs.    Assessment/Plan: 1. Functional deficits secondary to [facility] due to ruptured ACOM aneurysm which require 3+ hours per day of interdisciplinary therapy in a comprehensive inpatient rehab setting. Physiatrist is providing close team supervision and 24 hour management of active medical problems listed below. Physiatrist and rehab team  continue to assess barriers to discharge/monitor patient progress toward functional and medical goals.  FIM: FIM - Bathing Bathing Steps Patient Completed: Chest;Right Arm;Left Arm;Abdomen;Front perineal area;Buttocks;Right upper leg;Left upper leg Bathing: 4: Min-Patient completes 8-9 36f 10 parts or 75+ percent  FIM - Upper Body Dressing/Undressing Upper body dressing/undressing steps patient completed: Thread/unthread right sleeve of pullover shirt/dresss;Thread/unthread left sleeve of pullover shirt/dress;Put head through opening of pull over shirt/dress;Pull shirt over trunk Upper body dressing/undressing: 5: Set-up assist to: Obtain clothing/put away FIM - Lower Body Dressing/Undressing Lower body dressing/undressing steps patient completed: Thread/unthread right pants leg;Thread/unthread left pants leg;Pull pants up/down;Fasten/unfasten pants;Don/Doff right sock;Don/Doff left sock;Don/Doff right shoe;Don/Doff left shoe Lower body dressing/undressing: 4: Min-Patient completed 75 plus % of tasks  FIM - Toileting Toileting steps completed by patient: Adjust clothing prior to toileting;Performs perineal hygiene;Adjust clothing after toileting Toileting: 4: Steadying assist  FIM - Diplomatic Services operational officer Devices: Elevated toilet seat Toilet Transfers: 4-To toilet/BSC: Min A (steadying Pt. > 75%)  FIM - Bed/Chair Transfer Bed/Chair Transfer Assistive Devices: Therapist, occupational: 6: More than reasonable amt of time;4: Bed > Chair or W/C: Min A (steadying Pt. > 75%)  FIM - Locomotion: Wheelchair Locomotion: Wheelchair: 2: Travels 50 - 149 ft with supervision, cueing or coaxing FIM - Locomotion: Ambulation Locomotion: Ambulation Assistive Devices: Designer, industrial/product Ambulation/Gait Assistance: 3: Mod assist Locomotion: Ambulation: 3: Travels 150 ft or more with moderate assistance (Pt: 50 - 74%) (Follow closely with w/c)  Comprehension Comprehension Mode:  Auditory Comprehension: 2-Understands basic 25 - 49% of the time/requires cueing 51 - 75% of the time  Expression Expression Mode: Verbal Expression: 3-Expresses basic 50 - 74% of the time/requires cueing 25 - 50% of the time. Needs to repeat parts of sentences.  Social Interaction Social Interaction: 2-Interacts appropriately 25 - 49% of time - Needs frequent redirection.  Problem Solving Problem Solving: 1-Solves basic less than 25% of the time - needs direction nearly all the time or does not effectively solve problems and may need a restraint for safety  Memory Memory: 1-Recognizes or recalls less than 25% of the time/requires cueing greater than 75% of the time   Medical Problem List and Plan:  1. Right SAH due to ACOM aneurysm. Status post coiling and IVC drain  2. DVT Prophylaxis/Anticoagulation: SCDs. Monitor for any signs of DVT  3. Pain Management: Oxycodone as needed. Monitor with increased mobility  4. Mood/delirium. Bed alarm for safety. Followup speech therapy for cognition  -showing improvement but has poor judgement and insight.  -fall safety planning 5. Neuropsych: This patient is not capable of making decisions on his/her own behalf.  6. Hyponatremia. Resolved. Off sodium and demeclocycline  7. Hypertension. Discuss plan to taper nimodipine with IR 8.  Leukocytosis without fever or symptoms, UA clean--- check culture  - afebrile  LOS (Days) 5 A FACE TO FACE EVALUATION WAS PERFORMED  Messina Kosinski T 12/22/2012, 9:10 AM   Patient ID: Dennis Wyatt, male   DOB: [birth year], 52 y.o.   MRN: [id number] Subjective/Complaints: 52 y.o. right-handed male with history of hypertension. Admitted 12/03/2012 with headache and dizziness. Blood pressure noted to be 204/123. Cranial CT  scan showed subarachnoid hemorrhage with concern for ruptured aneurysm. Four-vessel cerebral arteriogram showed 11 mm x 7 mm irregular right ACA A1 aneurysm. Interventional radiology consulted and  patient underwent coiling of aneurysm, IVC drain. Patient did remain intubated for a time followed by critical care medicine. Latest followup cranial CT scan 12/13/2012 stable and external ventricular drain has been removed. Close monitoring of blood pressure as patient remained on nimodipine x21 day protocol and await plan to taper . He is tolerating a regular consistency diet. Patient with bouts of hyponatremia 115 and placed on Declomycin as well as sodium chloride tablets with latest followup sodium level 135 on 12/16/2012  Patient is sleepy this morning, arouses to verbal. States name, incorrectly states place, unable to state day or date Review of Systems  Unable to perform ROS: mental acuity    Objective: Vital Signs: Blood pressure 145/76, pulse 60, temperature 98.1 F (36.7 C), temperature source Oral, resp. rate 17, SpO2 97.00%. No results found. Results for orders placed during the hospital encounter of 12/17/12 (from the past 72 hour(s))  CLOSTRIDIUM DIFFICILE BY PCR     Status: None   Collection Time    12/19/12  6:39 PM      Result Value Range   C difficile by pcr NEGATIVE  NEGATIVE  CBC WITH DIFFERENTIAL     Status: Abnormal   Collection Time    12/20/12  5:30 AM      Result Value Range   WBC 13.3 (*) 4.0 - 10.5 K/uL   RBC 5.44  4.22 - 5.81 MIL/uL   Hemoglobin 17.7 (*) 13.0 - 17.0 g/dL   HCT 44.0  10.2 - 72.5 %   MCV 89.7  78.0 - 100.0 fL   MCH 32.5  26.0 - 34.0 pg   MCHC 36.3 (*) 30.0 - 36.0 g/dL   RDW 36.6  44.0 - 34.7 %   Platelets 184  150 - 400 K/uL   Neutrophils Relative % 77  43 - 77 %   Neutro Abs 10.3 (*) 1.7 - 7.7 K/uL   Lymphocytes Relative 12  12 - 46 %   Lymphs Abs 1.6  0.7 - 4.0 K/uL   Monocytes Relative 10  3 - 12 %   Monocytes Absolute 1.4 (*) 0.1 - 1.0 K/uL   Eosinophils Relative 0  0 - 5 %   Eosinophils Absolute 0.0  0.0 - 0.7 K/uL   Basophils Relative 0  0 - 1 %   Basophils Absolute 0.0  0.0 - 0.1 K/uL  COMPREHENSIVE METABOLIC PANEL      Status: Abnormal   Collection Time    12/20/12  5:30 AM      Result Value Range   Sodium 138  135 - 145 mEq/L   Potassium 3.8  3.5 - 5.1 mEq/L   Chloride 101  96 - 112 mEq/L   CO2 28  19 - 32 mEq/L   Glucose, Bld 93  70 - 99 mg/dL   BUN 19  6 - 23 mg/dL   Creatinine, Ser 4.25  0.50 - 1.35 mg/dL   Calcium 9.5  8.4 - 95.6 mg/dL   Total Protein 7.0  6.0 - 8.3 g/dL   Albumin 3.2 (*) 3.5 - 5.2 g/dL   AST 18  0 - 37 U/L   ALT 40  0 - 53 U/L   Alkaline Phosphatase 141 (*) 39 - 117 U/L   Total Bilirubin 0.7  0.3 - 1.2 mg/dL   GFR calc non Af  Amer >90  >90 mL/min   GFR calc Af Amer >90  >90 mL/min   Comment:            The eGFR has been calculated     using the CKD EPI equation.     This calculation has not been     validated in all clinical     situations.     eGFR's persistently     <90 mL/min signify     possible Chronic Kidney Disease.  URINALYSIS, ROUTINE W REFLEX MICROSCOPIC     Status: None   Collection Time    12/21/12  6:01 PM      Result Value Range   Color, Urine YELLOW  YELLOW   APPearance CLEAR  CLEAR   Specific Gravity, Urine 1.021  1.005 - 1.030   pH 5.5  5.0 - 8.0   Glucose, UA NEGATIVE  NEGATIVE mg/dL   Hgb urine dipstick NEGATIVE  NEGATIVE   Bilirubin Urine NEGATIVE  NEGATIVE   Ketones, ur NEGATIVE  NEGATIVE mg/dL   Protein, ur NEGATIVE  NEGATIVE mg/dL   Urobilinogen, UA 0.2  0.0 - 1.0 mg/dL   Nitrite NEGATIVE  NEGATIVE   Leukocytes, UA NEGATIVE  NEGATIVE   Comment: MICROSCOPIC NOT DONE ON URINES WITH NEGATIVE PROTEIN, BLOOD, LEUKOCYTES, NITRITE, OR GLUCOSE <1000 mg/dL.  CBC WITH DIFFERENTIAL     Status: Abnormal   Collection Time    12/22/12  5:55 AM      Result Value Range   WBC 9.3  4.0 - 10.5 K/uL   RBC 5.60  4.22 - 5.81 MIL/uL   Hemoglobin 17.8 (*) 13.0 - 17.0 g/dL   HCT 16.1 (*) 09.6 - 04.5 %   MCV 93.4  78.0 - 100.0 fL   MCH 31.8  26.0 - 34.0 pg   MCHC 34.0  30.0 - 36.0 g/dL   RDW 40.9  81.1 - 91.4 %   Platelets 176  150 - 400 K/uL    Neutrophils Relative % 62  43 - 77 %   Neutro Abs 5.7  1.7 - 7.7 K/uL   Lymphocytes Relative 27  12 - 46 %   Lymphs Abs 2.5  0.7 - 4.0 K/uL   Monocytes Relative 10  3 - 12 %   Monocytes Absolute 0.9  0.1 - 1.0 K/uL   Eosinophils Relative 2  0 - 5 %   Eosinophils Absolute 0.2  0.0 - 0.7 K/uL   Basophils Relative 1  0 - 1 %   Basophils Absolute 0.1  0.0 - 0.1 K/uL      Prior drain sites closed/staples. No drainage.Eyes: EOM are normal.  Neck: Neck supple. No JVD present. No tracheal deviation present. No thyromegaly present.  Cardiovascular: Normal rate and regular rhythm. No murmurs rubs or galloops  Pulmonary/Chest: Breath sounds normal. No respiratory distress. No wheezes or rales or rhonchi  Abdominal: Soft. Bowel sounds are normal. He exhibits no distension. There is no tenderness.  Musculoskeletal: He exhibits no edema.  Lymphadenopathy:  He has no cervical adenopathy.  Neurological: He is lethargic  Patient was able to provide his name . Patient with limited awareness of his deficits. He did follow simple commands.  He needed cues to maintain arousal. Moved all 4's with 4 to 4+/5 strength with. No gross sensory deficits. A little impulsive. No gross limb ataxia. Senses pain and LT in all 4 limbs.    Assessment/Plan: 1. Functional deficits secondary to [facility] due to ruptured ACOM aneurysm which  require 3+ hours per day of interdisciplinary therapy in a comprehensive inpatient rehab setting. Physiatrist is providing close team supervision and 24 hour management of active medical problems listed below. Physiatrist and rehab team continue to assess barriers to discharge/monitor patient progress toward functional and medical goals. FIM: FIM - Bathing Bathing Steps Patient Completed: Chest;Right Arm;Left Arm;Abdomen;Front perineal area;Buttocks;Right upper leg;Left upper leg Bathing: 4: Min-Patient completes 8-9 62f 10 parts or 75+ percent  FIM - Upper Body Dressing/Undressing Upper  body dressing/undressing steps patient completed: Thread/unthread right sleeve of pullover shirt/dresss;Thread/unthread left sleeve of pullover shirt/dress;Put head through opening of pull over shirt/dress;Pull shirt over trunk Upper body dressing/undressing: 5: Set-up assist to: Obtain clothing/put away FIM - Lower Body Dressing/Undressing Lower body dressing/undressing steps patient completed: Thread/unthread right pants leg;Thread/unthread left pants leg;Pull pants up/down;Fasten/unfasten pants;Don/Doff right sock;Don/Doff left sock;Don/Doff right shoe;Don/Doff left shoe Lower body dressing/undressing: 4: Min-Patient completed 75 plus % of tasks  FIM - Toileting Toileting steps completed by patient: Adjust clothing prior to toileting;Performs perineal hygiene;Adjust clothing after toileting Toileting: 4: Steadying assist  FIM - Diplomatic Services operational officer Devices: Elevated toilet seat Toilet Transfers: 4-To toilet/BSC: Min A (steadying Pt. > 75%)  FIM - Bed/Chair Transfer Bed/Chair Transfer Assistive Devices: Therapist, occupational: 6: More than reasonable amt of time;4: Bed > Chair or W/C: Min A (steadying Pt. > 75%)  FIM - Locomotion: Wheelchair Locomotion: Wheelchair: 2: Travels 50 - 149 ft with supervision, cueing or coaxing FIM - Locomotion: Ambulation Locomotion: Ambulation Assistive Devices: Designer, industrial/product Ambulation/Gait Assistance: 3: Mod assist Locomotion: Ambulation: 3: Travels 150 ft or more with moderate assistance (Pt: 50 - 74%) (Follow closely with w/c)  Comprehension Comprehension Mode: Auditory Comprehension: 2-Understands basic 25 - 49% of the time/requires cueing 51 - 75% of the time  Expression Expression Mode: Verbal Expression: 3-Expresses basic 50 - 74% of the time/requires cueing 25 - 50% of the time. Needs to repeat parts of sentences.  Social Interaction Social Interaction: 2-Interacts appropriately 25 - 49% of time - Needs frequent  redirection.  Problem Solving Problem Solving: 1-Solves basic less than 25% of the time - needs direction nearly all the time or does not effectively solve problems and may need a restraint for safety  Memory Memory: 1-Recognizes or recalls less than 25% of the time/requires cueing greater than 75% of the time Medical Problem List and Plan:  1. Right SAH due to ACOM aneurysm. Status post coiling and IVC drain  2. DVT Prophylaxis/Anticoagulation: SCDs. Monitor for any signs of DVT  3. Pain Management: Oxycodone as needed. Monitor with increased mobility  4. Mood/delirium. Bed alarm for safety. Followup speech therapy for cognition  -showing improvement but has poor judgement and insight.  -is a fall risk----fall safety planning  5. Neuropsych: This patient is not capable of making decisions on his/her own behalf.  6. Hyponatremia. Resolved. Taper off Declomycin and patient remains on sodium chloride tablets. Followup chemistries during rehab stay. Wean off Na+ tabs?  7. Hypertension. Discuss plan to taper nimodipine with IR 8.  Leukocytosis without fever or symptoms, will check UA, monitor CBC for trend LOS (Days) 5 A FACE TO FACE EVALUATION WAS PERFORMED  Clester Chlebowski T 12/22/2012, 9:10 AM

## 2012-12-22 NOTE — Progress Notes (Signed)
Occupational Therapy Session Note  Patient Details  Name: Dennis Wyatt MRN: [id number] Date of Birth: 05/30/1961  Today's Date: 12/22/2012 Time: 1100-1158 Time Calculation (min): 58 min  Short Term Goals: Week 1:  OT Short Term Goal 1 (Week 1): Patient will identify need to void, and wait for assistance to arrive OT Short Term Goal 2 (Week 1): Patient will transfer to toilet with close supervision OT Short Term Goal 3 (Week 1): Patient will dress lower body with min assist OT Short Term Goal 4 (Week 1): Patient will complete steps of grooming tasks with only initial verbal directive, or environmental cue  Skilled Therapeutic Interventions/Progress Updates:    Pt seated in w/c with niece cutting hair.  Pt agreeable to bathing at shower level and dressing with sit<>stand from chair.  Pt amb with RW to bathroom with steady A.  Pt exhibited decreased problem solving with RW in bathroom and positioning for transfer to tub bench.  Pt exhibited decreased safety awareness in shower and required mod verbal cues for safety.  When ambulating to w/c with RW patient attempted to push RW aside and walk to w/c without AD.  Pt required max verbal cues and physical cues to redirect.  Pt c/o increased headache throughout session and required assistance donning socks this morning.  RN notified and attended to patient in room. Pt's niece was present throughout session and provided appropriate verbal cues for redirection.  Therapy Documentation Precautions:  Precautions Precautions: Fall Precaution Comments: confused, disoriented, light headed Restrictions Weight Bearing Restrictions: No Pain: Pain Assessment Pain Assessment: 0-10 Pain Score:   8 Pain Type: Acute pain Pain Location: Head Pain Orientation: Anterior Pain Descriptors / Indicators: Headache Patients Stated Pain Goal: 2 Pain Intervention(s): RN made aware  See FIM for current functional status  Therapy/Group: Individual  Therapy  [name], [name] 12/22/2012, 12:02 PM

## 2012-12-23 ENCOUNTER — Inpatient Hospital Stay (HOSPITAL_COMMUNITY): Payer: No Typology Code available for payment source | Admitting: Speech Pathology

## 2012-12-23 ENCOUNTER — Inpatient Hospital Stay (HOSPITAL_COMMUNITY): Payer: No Typology Code available for payment source | Admitting: Physical Therapy

## 2012-12-23 ENCOUNTER — Inpatient Hospital Stay (HOSPITAL_COMMUNITY): Payer: No Typology Code available for payment source

## 2012-12-23 ENCOUNTER — Inpatient Hospital Stay (HOSPITAL_COMMUNITY): Payer: No Typology Code available for payment source | Admitting: *Deleted

## 2012-12-23 LAB — URINE CULTURE
Colony Count: NO GROWTH
Culture: NO GROWTH

## 2012-12-23 LAB — CBC
HCT: 52.6 % — ABNORMAL HIGH (ref 39.0–52.0)
Hemoglobin: 18.5 g/dL — ABNORMAL HIGH (ref 13.0–17.0)
MCH: 32.7 pg (ref 26.0–34.0)
MCHC: 35.2 g/dL (ref 30.0–36.0)
MCV: 93.1 fL (ref 78.0–100.0)
Platelets: 181 10*3/uL (ref 150–400)
RBC: 5.65 MIL/uL (ref 4.22–5.81)
RDW: 12.6 % (ref 11.5–15.5)
WBC: 16.9 10*3/uL — ABNORMAL HIGH (ref 4.0–10.5)

## 2012-12-23 LAB — BASIC METABOLIC PANEL
BUN: 19 mg/dL (ref 6–23)
CO2: 29 mEq/L (ref 19–32)
Calcium: 10.5 mg/dL (ref 8.4–10.5)
Chloride: 108 mEq/L (ref 96–112)
Creatinine, Ser: 0.74 mg/dL (ref 0.50–1.35)
GFR calc Af Amer: >90 mL/min (ref >90)
GFR calc non Af Amer: >90 mL/min (ref >90)
Glucose, Bld: 131 mg/dL — ABNORMAL HIGH (ref 70–99)
Potassium: 4.6 meq/L (ref 3.5–5.1)
Sodium: 145 meq/L (ref 135–145)

## 2012-12-23 MED ORDER — NIMODIPINE 30 MG PO CAPS
60.0000 mg | ORAL_CAPSULE | ORAL | Status: DC
  Administered 2012-12-23 – 2012-12-28 (×27): 60 mg via ORAL
  Filled 2012-12-23 (×36): qty 2

## 2012-12-23 MED ORDER — OXYCODONE HCL 5 MG PO TABS
10.0000 mg | ORAL_TABLET | ORAL | Status: DC | PRN
  Filled 2012-12-23: qty 2

## 2012-12-23 MED ORDER — OXYCODONE HCL 5 MG PO TABS
10.0000 mg | ORAL_TABLET | ORAL | Status: DC | PRN
  Administered 2012-12-23 – 2013-01-01 (×33): 10 mg via ORAL
  Filled 2012-12-23 (×32): qty 2

## 2012-12-23 NOTE — Progress Notes (Signed)
Speech Language Pathology Daily Session Note  Patient Details  Name: Dennis Wyatt MRN: [id number] Date of Birth: 06/09/1961  Today's Date: 12/23/2012 Time: 1340-1415 Time Calculation (min): 35 min  Short Term Goals: Week 1: SLP Short Term Goal 1 (Week 1): Patient will sustain attention to familiar task for 10 minutes with Mod assist vebral cues for re-direction. SLP Short Term Goal 2 (Week 1): Patient will utilize external aids to assist with orientation with Mod assist question cues. SLP Short Term Goal 3 (Week 1): Patient will verbalize 3 deficits that impact his ability to safely complete basic ADLs with Mod question cues. SLP Short Term Goal 4 (Week 1): Patient will demonstrate basic problem solving with Mod assist clinician cues. SLP Short Term Goal 5 (Week 1): Patient will utilize word finding strategies during structured tasks with Mod assist verbal cues.  Skilled Therapeutic Interventions: Treatment focus on cognitive goals. Pt missed beginning of 10 minutes due to toileting with nursing staff.  Pt demonstrated severe fatigue and lethargy throughout the session and required constant verbal and tactile cues to sustain attention to task for ~1 minute. Pt participated in new learning task with a mildly complex card game and required Min verbal and question cues for functional problem solving and organization throughout the task. Pt required +2 for transfer from wheelchair to bed due to decreased motor planning and initiation. Pt also required hand over hand assist for appropriate hand placement during the transfer. Suspect transfer impacted by fatigue and lethargy.    FIM:  Comprehension Comprehension Mode: Auditory Comprehension: 3-Understands basic 50 - 74% of the time/requires cueing 25 - 50%  of the time Expression Expression Mode: Verbal Expression: 3-Expresses basic 50 - 74% of the time/requires cueing 25 - 50% of the time. Needs to repeat parts of sentences. Social  Interaction Social Interaction: 3-Interacts appropriately 50 - 74% of the time - May be physically or verbally inappropriate. Problem Solving Problem Solving: 1-Solves basic less than 25% of the time - needs direction nearly all the time or does not effectively solve problems and may need a restraint for safety Memory Memory: 1-Recognizes or recalls less than 25% of the time/requires cueing greater than 75% of the time  Pain Pain Assessment Pain Assessment: No/denies pain  Therapy/Group: Individual Therapy  Slade Pierpoint 12/23/2012, 4:53 PM

## 2012-12-23 NOTE — Evaluation (Signed)
Recreational Therapy Assessment and Plan  Patient Details  Name: Derryl Uher MRN: [id number] Date of Birth: [birth year] Today's Date: 12/23/2012  Rehab Potential: Good ELOS: 2 weeks   Assessment Clinical Impression: Problem List:  Patient Active Problem List    Diagnosis  Date Noted   .  Hyponatremia  12/13/2012   .  Hyperglycemia  12/06/2012   .  Subarachnoid hemorrhage from anterior communicating artery aneurysm  12/05/2012   .  Anterior communicating artery aneurysm  12/05/2012   .  Acute respiratory failure  12/03/2012   .  Malignant hypertension  12/03/2012   .  Altered mental status  12/03/2012    Past Medical History:  Past Medical History   Diagnosis  Date   .  Hypertension     Past Surgical History:  Past Surgical History   Procedure  Laterality  Date   .  Radiology with anesthesia  N/A  12/03/2012     Procedure: RADIOLOGY WITH ANESTHESIA; Surgeon: Oneal Grout, MD; Location: [facility] OR; Service: Radiology; Laterality: N/A;    Assessment & Plan  Clinical Impression: Patient is a 52 y.o. right-handed male with history of hypertension. Admitted 12/03/2012 with headache and dizziness. Blood pressure noted to be 204/123. Cranial CT scan showed subarachnoid hemorrhage with concern for ruptured aneurysm. Four-vessel cerebral arteriogram showed 11 mm x 7 mm irregular right ACA A1 aneurysm. Interventional radiology consulted and patient underwent coiling of aneurysm, IVC drain. Patient did remain intubated for a time followed by critical care medicine. Latest followup cranial CT scan 12/13/2012 stable and external ventricular drain has been removed. He is tolerating a regular consistency diet. Physical and occupational therapy evaluations completed with noted bouts of confusion. Patient transferred to CIR on 12/17/2012.   Pt presents with decreased activity tolerance, decreased functional mobility, decreased balance, decreased vision, decreased attention, decreased  awareness,decreased memory, decreased problem solving, decreased safety awareness Limiting pt's independence with leisure/community pursuits.   Leisure History/Participation Premorbid leisure interest/current participation: Games - Cards;Community - Grocery store;Community - Warden/ranger - Board games ("drinking games") Expression Interests: Music (Comment) Other Leisure Interests: Television;Cooking/Baking Leisure Participation Style: With Family/Friends;Alone Awareness of Community Resources: Good-identify 3 post discharge leisure resources Psychosocial / Spiritual Patient agreeable to Pet Therapy: No Does patient have pets?: No Social interaction - Mood/Behavior: Cooperative Film/video editor for Education?: Yes Recreational Therapy Orientation Orientation -Reviewed with patient: Available activity resources Strengths/Weaknesses Patient Strengths/Abilities: Willingness to participate Patient weaknesses: Physical limitations  Plan Rec Therapy Plan Is patient appropriate for Therapeutic Recreation?: Yes Rehab Potential: Good Treatment times per week: Min 1 time per week >20 minutes Estimated Length of Stay: 2 weeks TR Treatment/Interventions: Adaptive equipment instruction;1:1 session;Balance/vestibular training;Functional mobility training;Community reintegration;Cognitive remediation/compensation;Patient/family education;Therapeutic activities;Recreation/leisure participation;Therapeutic exercise;UE/LE Coordination activities;Wheelchair propulsion/positioning Recommendations for other services: Neuropsych  Recommendations for other services: Neuropsych  Discharge Criteria: Patient will be discharged from TR if patient refuses treatment 3 consecutive times without medical reason.  If treatment goals not met, if there is a change in medical status, if patient makes no progress towards goals or if patient is discharged from hospital.  The above assessment,  treatment plan, treatment alternatives and goals were discussed and mutually agreed upon: by patient  Hydee Fleece 12/23/2012, 3:46 PM

## 2012-12-23 NOTE — Progress Notes (Signed)
Patient ID: Dennis Wyatt, male   DOB: 03/14/1961, 52 y.o.   MRN: [id number] Subjective/Complaints:  Headache at night. Feels ok now. Review of Systems  Unable to perform ROS: mental acuity    Objective: Vital Signs: Blood pressure 153/80, pulse 65, temperature 98 F (36.7 C), temperature source Oral, resp. rate 18, weight 100.9 kg (222 lb 7.1 oz), SpO2 95.00%. No results found. Results for orders placed during the hospital encounter of 12/17/12 (from the past 72 hour(s))  URINALYSIS, ROUTINE W REFLEX MICROSCOPIC     Status: None   Collection Time    12/21/12  6:01 PM      Result Value Range   Color, Urine YELLOW  YELLOW   APPearance CLEAR  CLEAR   Specific Gravity, Urine 1.021  1.005 - 1.030   pH 5.5  5.0 - 8.0   Glucose, UA NEGATIVE  NEGATIVE mg/dL   Hgb urine dipstick NEGATIVE  NEGATIVE   Bilirubin Urine NEGATIVE  NEGATIVE   Ketones, ur NEGATIVE  NEGATIVE mg/dL   Protein, ur NEGATIVE  NEGATIVE mg/dL   Urobilinogen, UA 0.2  0.0 - 1.0 mg/dL   Nitrite NEGATIVE  NEGATIVE   Leukocytes, UA NEGATIVE  NEGATIVE   Comment: MICROSCOPIC NOT DONE ON URINES WITH NEGATIVE PROTEIN, BLOOD, LEUKOCYTES, NITRITE, OR GLUCOSE <1000 mg/dL.  CBC WITH DIFFERENTIAL     Status: Abnormal   Collection Time    12/22/12  5:55 AM      Result Value Range   WBC 9.3  4.0 - 10.5 K/uL   RBC 5.60  4.22 - 5.81 MIL/uL   Hemoglobin 17.8 (*) 13.0 - 17.0 g/dL   HCT 82.9 (*) 56.2 - 13.0 %   MCV 93.4  78.0 - 100.0 fL   MCH 31.8  26.0 - 34.0 pg   MCHC 34.0  30.0 - 36.0 g/dL   RDW 86.5  78.4 - 69.6 %   Platelets 176  150 - 400 K/uL   Neutrophils Relative % 62  43 - 77 %   Neutro Abs 5.7  1.7 - 7.7 K/uL   Lymphocytes Relative 27  12 - 46 %   Lymphs Abs 2.5  0.7 - 4.0 K/uL   Monocytes Relative 10  3 - 12 %   Monocytes Absolute 0.9  0.1 - 1.0 K/uL   Eosinophils Relative 2  0 - 5 %   Eosinophils Absolute 0.2  0.0 - 0.7 K/uL   Basophils Relative 1  0 - 1 %   Basophils Absolute 0.1  0.0 - 0.1 K/uL  CBC     Status:  Abnormal   Collection Time    12/23/12  5:30 AM      Result Value Range   WBC 16.9 (*) 4.0 - 10.5 K/uL   RBC 5.65  4.22 - 5.81 MIL/uL   Hemoglobin 18.5 (*) 13.0 - 17.0 g/dL   HCT 29.5 (*) 28.4 - 13.2 %   MCV 93.1  78.0 - 100.0 fL   MCH 32.7  26.0 - 34.0 pg   MCHC 35.2  30.0 - 36.0 g/dL   RDW 44.0  10.2 - 72.5 %   Platelets 181  150 - 400 K/uL  BASIC METABOLIC PANEL     Status: Abnormal   Collection Time    12/23/12  5:30 AM      Result Value Range   Sodium 145  135 - 145 mEq/L   Potassium 4.6  3.5 - 5.1 mEq/L   Chloride 108  96 - 112 mEq/L  CO2 29  19 - 32 mEq/L   Glucose, Bld 131 (*) 70 - 99 mg/dL   BUN 19  6 - 23 mg/dL   Creatinine, Ser 1.61  0.50 - 1.35 mg/dL   Calcium 09.6  8.4 - 04.5 mg/dL   GFR calc non Af Amer >90  >90 mL/min   GFR calc Af Amer >90  >90 mL/min   Comment:            The eGFR has been calculated     using the CKD EPI equation.     This calculation has not been     validated in all clinical     situations.     eGFR's persistently     <90 mL/min signify     possible Chronic Kidney Disease.      Prior drain sites closed/staples. No drainage.Eyes: EOM are normal.  Neck: Neck supple. No JVD present. No tracheal deviation present. No thyromegaly present.  Cardiovascular: Normal rate and regular rhythm. No murmurs rubs or galloops  Pulmonary/Chest: Breath sounds normal. No respiratory distress. No wheezes or rales or rhonchi  Abdominal: Soft. Bowel sounds are normal. He exhibits no distension. There is no tenderness.  Musculoskeletal: He exhibits no edema.  Lymphadenopathy:  He has no cervical adenopathy.  Neurological: He is lethargic  Patient was able to provide his name . Patient with limited awareness of his deficits. He did follow simple commands.  He needed cues to maintain arousal. Moved all 4's with 4 to 4+/5 strength with. No gross sensory deficits. A little impulsive. No gross limb ataxia. Senses pain and LT in all 4 limbs.     Assessment/Plan: 1. Functional deficits secondary to [facility] due to ruptured ACOM aneurysm which require 3+ hours per day of interdisciplinary therapy in a comprehensive inpatient rehab setting. Physiatrist is providing close team supervision and 24 hour management of active medical problems listed below. Physiatrist and rehab team continue to assess barriers to discharge/monitor patient progress toward functional and medical goals.  FIM: FIM - Bathing Bathing Steps Patient Completed: Chest;Right Arm;Left Arm;Abdomen;Front perineal area;Buttocks;Right upper leg;Left upper leg Bathing: 4: Min-Patient completes 8-9 24f 10 parts or 75+ percent  FIM - Upper Body Dressing/Undressing Upper body dressing/undressing steps patient completed: Thread/unthread right sleeve of pullover shirt/dresss;Thread/unthread left sleeve of pullover shirt/dress;Put head through opening of pull over shirt/dress;Pull shirt over trunk Upper body dressing/undressing: 5: Set-up assist to: Obtain clothing/put away FIM - Lower Body Dressing/Undressing Lower body dressing/undressing steps patient completed: Thread/unthread right pants leg;Thread/unthread left pants leg;Pull pants up/down;Fasten/unfasten pants;Don/Doff right shoe;Don/Doff left shoe Lower body dressing/undressing: 4: Min-Patient completed 75 plus % of tasks  FIM - Toileting Toileting steps completed by patient: Adjust clothing prior to toileting;Performs perineal hygiene;Adjust clothing after toileting Toileting: 4: Steadying assist  FIM - Diplomatic Services operational officer Devices: Elevated toilet seat Toilet Transfers: 4-To toilet/BSC: Min A (steadying Pt. > 75%)  FIM - Bed/Chair Transfer Bed/Chair Transfer Assistive Devices: Therapist, occupational: 4: Bed > Chair or W/C: Min A (steadying Pt. > 75%);6: More than reasonable amt of time  FIM - Locomotion: Wheelchair Locomotion: Wheelchair: 2: Travels 50 - 149 ft with supervision, cueing or  coaxing FIM - Locomotion: Ambulation Locomotion: Ambulation Assistive Devices: Designer, industrial/product Ambulation/Gait Assistance: 3: Mod assist Locomotion: Ambulation: 4: Travels 150 ft or more with minimal assistance (Pt.>75%) (Wheelchair behind)  Comprehension Comprehension Mode: Auditory Comprehension: 3-Understands basic 50 - 74% of the time/requires cueing 25 - 50%  of the time  Expression Expression Mode: Verbal Expression: 3-Expresses basic 50 - 74% of the time/requires cueing 25 - 50% of the time. Needs to repeat parts of sentences.  Social Interaction Social Interaction: 3-Interacts appropriately 50 - 74% of the time - May be physically or verbally inappropriate.  Problem Solving Problem Solving: 1-Solves basic less than 25% of the time - needs direction nearly all the time or does not effectively solve problems and may need a restraint for safety  Memory Memory: 1-Recognizes or recalls less than 25% of the time/requires cueing greater than 75% of the time   Medical Problem List and Plan:  1. Right SAH due to ACOM aneurysm. Status post coiling and IVC drain  2. DVT Prophylaxis/Anticoagulation: SCDs. Monitor for any signs of DVT  3. Pain Management: Oxycodone as needed. Monitor with increased mobility  4. Mood/delirium. Bed alarm for safety. Followup speech therapy for cognition  -showing improvement but has poor judgement and insight.  -fall safety planning 5. Neuropsych: This patient is not capable of making decisions on his/her own behalf.  6. Hyponatremia. Resolved. Off sodium and demeclocycline  7. Hypertension. Discuss plan to taper nimodipine with IR 8.  Leukocytosis without fever or symptoms, UA clean--- check culture  - afebrile  LOS (Days) 6 A FACE TO FACE EVALUATION WAS PERFORMED  , T 12/23/2012, 8:27 AM   Patient ID: Dennis Wyatt, male   DOB: May 20, 1961, 52 y.o.   MRN: [id number] Subjective/Complaints: 52 y.o. right-handed male with history of  hypertension. Admitted 12/03/2012 with headache and dizziness. Blood pressure noted to be 204/123. Cranial CT scan showed subarachnoid hemorrhage with concern for ruptured aneurysm. Four-vessel cerebral arteriogram showed 11 mm x 7 mm irregular right ACA A1 aneurysm. Interventional radiology consulted and patient underwent coiling of aneurysm, IVC drain. Patient did remain intubated for a time followed by critical care medicine. Latest followup cranial CT scan 12/13/2012 stable and external ventricular drain has been removed. Close monitoring of blood pressure as patient remained on nimodipine x21 day protocol and await plan to taper . He is tolerating a regular consistency diet. Patient with bouts of hyponatremia 115 and placed on Declomycin as well as sodium chloride tablets with latest followup sodium level 135 on 12/16/2012  Patient is sleepy this morning, arouses to verbal. States name, incorrectly states place, unable to state day or date Review of Systems  Unable to perform ROS: mental acuity    Objective: Vital Signs: Blood pressure 153/80, pulse 65, temperature 98 F (36.7 C), temperature source Oral, resp. rate 18, weight 100.9 kg (222 lb 7.1 oz), SpO2 95.00%. No results found. Results for orders placed during the hospital encounter of 12/17/12 (from the past 72 hour(s))  URINALYSIS, ROUTINE W REFLEX MICROSCOPIC     Status: None   Collection Time    12/21/12  6:01 PM      Result Value Range   Color, Urine YELLOW  YELLOW   APPearance CLEAR  CLEAR   Specific Gravity, Urine 1.021  1.005 - 1.030   pH 5.5  5.0 - 8.0   Glucose, UA NEGATIVE  NEGATIVE mg/dL   Hgb urine dipstick NEGATIVE  NEGATIVE   Bilirubin Urine NEGATIVE  NEGATIVE   Ketones, ur NEGATIVE  NEGATIVE mg/dL   Protein, ur NEGATIVE  NEGATIVE mg/dL   Urobilinogen, UA 0.2  0.0 - 1.0 mg/dL   Nitrite NEGATIVE  NEGATIVE   Leukocytes, UA NEGATIVE  NEGATIVE   Comment: MICROSCOPIC NOT DONE ON URINES WITH NEGATIVE PROTEIN, BLOOD,  LEUKOCYTES, NITRITE, OR GLUCOSE <  1000 mg/dL.  CBC WITH DIFFERENTIAL     Status: Abnormal   Collection Time    12/22/12  5:55 AM      Result Value Range   WBC 9.3  4.0 - 10.5 K/uL   RBC 5.60  4.22 - 5.81 MIL/uL   Hemoglobin 17.8 (*) 13.0 - 17.0 g/dL   HCT 40.9 (*) 81.1 - 91.4 %   MCV 93.4  78.0 - 100.0 fL   MCH 31.8  26.0 - 34.0 pg   MCHC 34.0  30.0 - 36.0 g/dL   RDW 78.2  95.6 - 21.3 %   Platelets 176  150 - 400 K/uL   Neutrophils Relative % 62  43 - 77 %   Neutro Abs 5.7  1.7 - 7.7 K/uL   Lymphocytes Relative 27  12 - 46 %   Lymphs Abs 2.5  0.7 - 4.0 K/uL   Monocytes Relative 10  3 - 12 %   Monocytes Absolute 0.9  0.1 - 1.0 K/uL   Eosinophils Relative 2  0 - 5 %   Eosinophils Absolute 0.2  0.0 - 0.7 K/uL   Basophils Relative 1  0 - 1 %   Basophils Absolute 0.1  0.0 - 0.1 K/uL  CBC     Status: Abnormal   Collection Time    12/23/12  5:30 AM      Result Value Range   WBC 16.9 (*) 4.0 - 10.5 K/uL   RBC 5.65  4.22 - 5.81 MIL/uL   Hemoglobin 18.5 (*) 13.0 - 17.0 g/dL   HCT 08.6 (*) 57.8 - 46.9 %   MCV 93.1  78.0 - 100.0 fL   MCH 32.7  26.0 - 34.0 pg   MCHC 35.2  30.0 - 36.0 g/dL   RDW 62.9  52.8 - 41.3 %   Platelets 181  150 - 400 K/uL  BASIC METABOLIC PANEL     Status: Abnormal   Collection Time    12/23/12  5:30 AM      Result Value Range   Sodium 145  135 - 145 mEq/L   Potassium 4.6  3.5 - 5.1 mEq/L   Chloride 108  96 - 112 mEq/L   CO2 29  19 - 32 mEq/L   Glucose, Bld 131 (*) 70 - 99 mg/dL   BUN 19  6 - 23 mg/dL   Creatinine, Ser 2.44  0.50 - 1.35 mg/dL   Calcium 01.0  8.4 - 27.2 mg/dL   GFR calc non Af Amer >90  >90 mL/min   GFR calc Af Amer >90  >90 mL/min   Comment:            The eGFR has been calculated     using the CKD EPI equation.     This calculation has not been     validated in all clinical     situations.     eGFR's persistently     <90 mL/min signify     possible Chronic Kidney Disease.      Prior drain sites closed/staples. No  drainage.Eyes: EOM are normal.  Neck: Neck supple. No JVD present. No tracheal deviation present. No thyromegaly present.  Cardiovascular: Normal rate and regular rhythm. No murmurs rubs or galloops  Pulmonary/Chest: Breath sounds normal. No respiratory distress. No wheezes or rales or rhonchi  Abdominal: Soft. Bowel sounds are normal. He exhibits no distension. There is no tenderness.  Musculoskeletal: He exhibits no edema.  Lymphadenopathy:  He has no cervical adenopathy.  Neurological: He is lethargic  Patient was able to provide his name . Patient with limited awareness of his deficits. He did follow simple commands.  He needed cues to maintain arousal. Moved all 4's with 4 to 4+/5 strength with. No gross sensory deficits. A little impulsive. No gross limb ataxia. Senses pain and LT in all 4 limbs.    Assessment/Plan: 1. Functional deficits secondary to [facility] due to ruptured ACOM aneurysm which require 3+ hours per day of interdisciplinary therapy in a comprehensive inpatient rehab setting. Physiatrist is providing close team supervision and 24 hour management of active medical problems listed below. Physiatrist and rehab team continue to assess barriers to discharge/monitor patient progress toward functional and medical goals. FIM: FIM - Bathing Bathing Steps Patient Completed: Chest;Right Arm;Left Arm;Abdomen;Front perineal area;Buttocks;Right upper leg;Left upper leg Bathing: 4: Min-Patient completes 8-9 15f 10 parts or 75+ percent  FIM - Upper Body Dressing/Undressing Upper body dressing/undressing steps patient completed: Thread/unthread right sleeve of pullover shirt/dresss;Thread/unthread left sleeve of pullover shirt/dress;Put head through opening of pull over shirt/dress;Pull shirt over trunk Upper body dressing/undressing: 5: Set-up assist to: Obtain clothing/put away FIM - Lower Body Dressing/Undressing Lower body dressing/undressing steps patient completed: Thread/unthread right  pants leg;Thread/unthread left pants leg;Pull pants up/down;Fasten/unfasten pants;Don/Doff right shoe;Don/Doff left shoe Lower body dressing/undressing: 4: Min-Patient completed 75 plus % of tasks  FIM - Toileting Toileting steps completed by patient: Adjust clothing prior to toileting;Performs perineal hygiene;Adjust clothing after toileting Toileting: 4: Steadying assist  FIM - Diplomatic Services operational officer Devices: Elevated toilet seat Toilet Transfers: 4-To toilet/BSC: Min A (steadying Pt. > 75%)  FIM - Bed/Chair Transfer Bed/Chair Transfer Assistive Devices: Therapist, occupational: 4: Bed > Chair or W/C: Min A (steadying Pt. > 75%);6: More than reasonable amt of time  FIM - Locomotion: Wheelchair Locomotion: Wheelchair: 2: Travels 50 - 149 ft with supervision, cueing or coaxing FIM - Locomotion: Ambulation Locomotion: Ambulation Assistive Devices: Designer, industrial/product Ambulation/Gait Assistance: 3: Mod assist Locomotion: Ambulation: 4: Travels 150 ft or more with minimal assistance (Pt.>75%) (Wheelchair behind)  Comprehension Comprehension Mode: Auditory Comprehension: 3-Understands basic 50 - 74% of the time/requires cueing 25 - 50%  of the time  Expression Expression Mode: Verbal Expression: 3-Expresses basic 50 - 74% of the time/requires cueing 25 - 50% of the time. Needs to repeat parts of sentences.  Social Interaction Social Interaction: 3-Interacts appropriately 50 - 74% of the time - May be physically or verbally inappropriate.  Problem Solving Problem Solving: 1-Solves basic less than 25% of the time - needs direction nearly all the time or does not effectively solve problems and may need a restraint for safety  Memory Memory: 1-Recognizes or recalls less than 25% of the time/requires cueing greater than 75% of the time Medical Problem List and Plan:  1. Right SAH due to ACOM aneurysm. Status post coiling and IVC drain  2. DVT  Prophylaxis/Anticoagulation: SCDs. Monitor for any signs of DVT  3. Pain Management: Oxycodone as needed. Monitor with increased mobility  4. Mood/delirium. Bed alarm for safety. Followup speech therapy for cognition  -showing improvement but has poor judgement and insight.  -is a fall risk----fall safety planning  -ritalin for attention/focus 5. Neuropsych: This patient is not capable of making decisions on his/her own behalf.  6. Hyponatremia. Resolved.  off Declomycin and  sodium chloride tablets. Followup chemistries  7. Hypertension. Discuss plan to taper nimodipine with IR 8.  Leukocytosis without fever or symptoms, ua negative, culture pending, monitor CBC  for trend LOS (Days) 6 A FACE TO FACE EVALUATION WAS PERFORMED  , T 12/23/2012, 8:27 AM

## 2012-12-23 NOTE — Progress Notes (Signed)
Occupational Therapy Session Note  Patient Details  Name: Dennis Wyatt MRN: [id number] Date of Birth: 1961-03-20  Today's Date: 12/23/2012  Session 1 Time: 4098-1191 Time Calculation (min): 56 min  Short Term Goals: Week 1:  OT Short Term Goal 1 (Week 1): Patient will identify need to void, and wait for assistance to arrive OT Short Term Goal 2 (Week 1): Patient will transfer to toilet with close supervision OT Short Term Goal 3 (Week 1): Patient will dress lower body with min assist OT Short Term Goal 4 (Week 1): Patient will complete steps of grooming tasks with only initial verbal directive, or environmental cue  Skilled Therapeutic Interventions/Progress Updates:    Pt in bed upon arrival.  Pt's Niece had just arrived.  Pt incontinent of bladder with no awareness.  When questioned if patient knew he had to urinate he stated he couldn't remember.  Pt engaged in bathing at shower level and dressing with sit<>stand from chair.  Pt required verbal cues for problem solving RW management in bathroom.  Pt exhibited LOB X 1 when amb with RW back into room requiring min A to correct.  Pt not oriented to place ("I have no idea") but was able to state why he was in hospital.  Oriented to year but not month and day.  Pt required max verbal cues for redirection to task in min distracted environment.  Focus on dynamic standing balance, safety awareness, attention to task, memory, and family education with niece.  Therapy Documentation Precautions:  Precautions Precautions: Fall Precaution Comments: confused, disoriented, light headed Restrictions Weight Bearing Restrictions: No Pain: Pain Assessment Pain Assessment: 0-10 Pain Score:   9 Pain Type: Acute pain Pain Location: Head Pain Orientation: Anterior Pain Descriptors / Indicators: Headache Pain Onset: On-going Patients Stated Pain Goal: 2 Pain Intervention(s): RN made aware  See FIM for current functional status  Therapy/Group:  Individual Therapy  Session 2 Time: 1300-1330 Pt c/o continued headache (7/10); RN aware Individual Therapy  Pt seated in w/c with niece present.  Pt's niece participated in therapy and provided appropriate verbal cues and directions. Pt amb with RW from room to laundry room to place clothing in washer.  Pt exhibited difficulty with problem solving controls on washer and required min verbal cues.  Discussed room number with patient and requested patient to locate room.  Pt initially passed room although he did remember room number.  Pt required min questioning verbal cues to redirect to room.  Pt remained in w/c with niece present.  Zayd, Bonet [facility] 12/23/2012, 8:57 AM

## 2012-12-23 NOTE — Progress Notes (Signed)
Physical Therapy Session Note  Patient Details  Name: Dennis Wyatt MRN: [id number] Date of Birth: [birth year]  Today's Date: 12/23/2012 Time: 1000-1100 Time Calculation (min): 60 min  Short Term Goals: Week 2:     Skilled Therapeutic Interventions/Progress Updates:   Pt orientated to date, time and place upon intiation of treatment session using calendar on wall and clock.  Pt able to recall some short term memory items intermittently but then requires redirection.  Constant cueing throughout treatment to help assist with improving cognitive function. Pt performed gait with RW 170 feet x 2 with w/c behind and min assist slight decreased assistance today.  Nu-step level 4 x 10 mins with pt keeping speed at about 2.0 to 2.5 METS to improve activity tolerance.  Standing there ex B LE in parallel bars x 10 reps with pt requiring 3 seated rests to complete. Static and Dynamic Balance activities without B UE support, pt able to tolerate approximately 30 seconds of unsupported balance and then grabs to table or walker to steady, standing balance while attempting to play checkers to reach with pt requiring redirection to complete turn.  Pt reported fatigue and had to sit suddenly 2 x 's while performing balance due to B knee instability.  Pain: Pt reports continued HA pain varies 4-9/10   Therapy Documentation Precautions:  Precautions Precautions: Fall Precaution Comments: confused, disoriented, light headed Restrictions Weight Bearing Restrictions: No    :       See FIM for current functional status  Therapy/Group: Individual Therapy  [name] 12/23/2012, 3:31 PM

## 2012-12-24 ENCOUNTER — Inpatient Hospital Stay (HOSPITAL_COMMUNITY): Payer: No Typology Code available for payment source

## 2012-12-24 ENCOUNTER — Inpatient Hospital Stay (HOSPITAL_COMMUNITY): Payer: No Typology Code available for payment source | Admitting: Physical Therapy

## 2012-12-24 ENCOUNTER — Inpatient Hospital Stay (HOSPITAL_COMMUNITY): Payer: No Typology Code available for payment source | Admitting: Speech Pathology

## 2012-12-24 DIAGNOSIS — I609 Nontraumatic subarachnoid hemorrhage, unspecified: Secondary | ICD-10-CM

## 2012-12-24 DIAGNOSIS — I10 Essential (primary) hypertension: Secondary | ICD-10-CM

## 2012-12-24 DIAGNOSIS — I803 Phlebitis and thrombophlebitis of lower extremities, unspecified: Secondary | ICD-10-CM

## 2012-12-24 MED ORDER — RIVAROXABAN 20 MG PO TABS: 20.0000 mg | ORAL_TABLET | Freq: Every day | ORAL | Status: DC

## 2012-12-24 MED ORDER — RIVAROXABAN 15 MG PO TABS: 15.0000 mg | ORAL_TABLET | Freq: Every day | ORAL | Status: DC

## 2012-12-24 MED ORDER — RIVAROXABAN 15 MG PO TABS
15.0000 mg | ORAL_TABLET | Freq: Two times a day (BID) | ORAL | Status: DC
  Administered 2012-12-24 – 2012-12-31 (×15): 15 mg via ORAL
  Filled 2012-12-24 (×18): qty 1

## 2012-12-24 MED ORDER — TOPIRAMATE 25 MG PO TABS
25.0000 mg | ORAL_TABLET | Freq: Every day | ORAL | Status: DC
  Administered 2012-12-24 – 2012-12-25 (×2): 25 mg via ORAL
  Filled 2012-12-24 (×3): qty 1

## 2012-12-24 MED ORDER — RIVAROXABAN 15 MG PO TABS
15.0000 mg | ORAL_TABLET | Freq: Two times a day (BID) | ORAL | Status: DC
  Filled 2012-12-24 (×3): qty 1

## 2012-12-24 NOTE — Progress Notes (Signed)
Results of vascular study returned showing occlusive DVT in the right peroneal vein as well as nonocclusive DVT in the popliteal vein. Dr. Riley Kill notified and plan at this time is for IVC filter with interventional radiology consulted. Plan of care has been discussed with niece.

## 2012-12-24 NOTE — Progress Notes (Signed)
Speech Language Pathology Daily Session Note  Patient Details  Name: Dennis Wyatt MRN: [id number] Date of Birth: 10/07/1960  Today's Date: 12/24/2012 Time: 1500-1530 Time Calculation (min): 30 min  Short Term Goals: Week 1: SLP Short Term Goal 1 (Week 1): Patient will sustain attention to familiar task for 10 minutes with Mod assist vebral cues for re-direction. SLP Short Term Goal 2 (Week 1): Patient will utilize external aids to assist with orientation with Mod assist question cues. SLP Short Term Goal 3 (Week 1): Patient will verbalize 3 deficits that impact his ability to safely complete basic ADLs with Mod question cues. SLP Short Term Goal 4 (Week 1): Patient will demonstrate basic problem solving with Mod assist clinician cues. SLP Short Term Goal 5 (Week 1): Patient will utilize word finding strategies during structured tasks with Mod assist verbal cues.  Skilled Therapeutic Interventions: Treatment focus on cognitive goals. SLP facilitated session by providing Min question and semantic cues for recall and carryover of newly learned information in regards to current medical condition (new DVT) and current treatment interventions. Pt independently utilized an external memory aid to orient to date and demonstrated alternating attention between meal and functional conversation for 30 minutes with Min verbal cues for redirection.    FIM:  Comprehension Comprehension Mode: Auditory Comprehension: 4-Understands basic 75 - 89% of the time/requires cueing 10 - 24% of the time Expression Expression: 4-Expresses basic 75 - 89% of the time/requires cueing 10 - 24% of the time. Needs helper to occlude trach/needs to repeat words. Social Interaction Social Interaction: 3-Interacts appropriately 50 - 74% of the time - May be physically or verbally inappropriate. Problem Solving Problem Solving: 2-Solves basic 25 - 49% of the time - needs direction more than half the time to initiate, plan or  complete simple activities Memory Memory: 2-Recognizes or recalls 25 - 49% of the time/requires cueing 51 - 75% of the time  Pain Pain Assessment Pain Assessment: No/denies pain  Therapy/Group: Individual Therapy  Kyran Connaughton 12/24/2012, 4:05 PM

## 2012-12-24 NOTE — Progress Notes (Signed)
Return call from interventional radiology concerning DVT in plan for IVC filter. Dr. Corliss Skains reviewed films and felt patient appropriate for anti-coagulation. Will begin Xarelto 20 mg twice a day x3 weeks then 20 mg daily. Plan followup Doppler studies one month

## 2012-12-24 NOTE — Progress Notes (Signed)
VASCULAR LAB PRELIMINARY  PRELIMINARY  PRELIMINARY  PRELIMINARY  Bilateral lower extremity venous duplex  completed.    Preliminary report:  Right:  Non occlusive DVT noted in the popliteal vein and occlusive DVT in the peroneal vein.  No evidence of superficial thrombosis.  No Baker's cyst.  Left:  No evidence of DVT, superficial thrombosis, or Baker's cyst.  Viktor Philipp, RVT 12/24/2012, 10:37 AM

## 2012-12-24 NOTE — Progress Notes (Signed)
Physical Therapy Session Note  Patient Details  Name: Dennis Wyatt MRN: [id number] Date of Birth: 1961-02-17  Today's Date: 12/24/2012 Time: 0830-0930 Time Calculation (min): 60 min  Short Term Goals: Week 1:  PT Short Term Goal 1 (Week 1): Pt will ambulate x 100' with moderate assist of one person PT Short Term Goal 2 (Week 1): Pt will tolerate 3 min continuous standing activity with min assist PT Short Term Goal 3 (Week 1): Pt will demonstrate sustained attention for >/= 5 min continuous.   Skilled Therapeutic Interventions/Progress Updates:    Pt reports some lightheadedness, BP 142/72 and no further c/o this during session.  Ambulation x 160' continuous with RW and min assist with wheelchair to follow. Standing balance activities with varied bases of support (working on balance reactions with narrow and single limb BOS) ball toss and reaching tasks with min to mod assist and mod verbal cues for attention. Pt reports headache with head turns, some of headaches may be coming from cervical tightness, pt performed some neck ROM and stretching reporting some relief.   Pt stated he needed to go to bathroom quickly, had small  bowel incontinence prior to reaching room. Bathroom mobility with RW and min assist frequent cues for safety, pt moves with some impulsivity at times. Pt cued to call therapist however attempted to stand prior to notifying therapist. Min assist for balance during cleaning and donning of brief and pants. Pt moved to nurses station seated in wheelchair with safety belt donned.   Therapy Documentation Precautions:  Precautions Precautions: Fall Precaution Comments: confused, disoriented, light headed Restrictions Weight Bearing Restrictions: No Pain: Pain Assessment Pain Type: Acute pain Pain Location: Head Pain Descriptors / Indicators: Aching Pain Onset:  (with head and body movements) Pain Intervention(s): Repositioned (rest as needed)  See FIM for current  functional status  Therapy/Group: Individual Therapy  Wilhemina Bonito 12/24/2012, 12:35 PM

## 2012-12-24 NOTE — Progress Notes (Signed)
Doppler report called to unit; Dam Anguilli PAc made aware. Orders received.   IVC filter planned, pt. NPO, niece to arrive to sign consent. 1230: New orders, no IVC filter placement, niece aware.  Pt resting comfortably in bed. Pt reports "better" pain control today. C/O headache throughout day but states relief with Oxy IR 10mg , taking every 4 hrs. Has been continent x3 with timed toileting, continent stool today; incont.urine x1. More alert, less drowsy today.

## 2012-12-24 NOTE — Progress Notes (Signed)
Patient ID: Dennis Wyatt, male   DOB: 1960-10-15, 52 y.o.   MRN: [id number] Subjective/Complaints:  Headache at night. Feels ok now. Review of Systems  Unable to perform ROS: mental acuity    Objective: Vital Signs: Blood pressure 142/62, pulse 58, temperature 98 F (36.7 C), temperature source Oral, resp. rate 18, weight 100.9 kg (222 lb 7.1 oz), SpO2 96.00%. No results found. Results for orders placed during the hospital encounter of 12/17/12 (from the past 72 hour(s))  URINALYSIS, ROUTINE W REFLEX MICROSCOPIC     Status: None   Collection Time    12/21/12  6:01 PM      Result Value Range   Color, Urine YELLOW  YELLOW   APPearance CLEAR  CLEAR   Specific Gravity, Urine 1.021  1.005 - 1.030   pH 5.5  5.0 - 8.0   Glucose, UA NEGATIVE  NEGATIVE mg/dL   Hgb urine dipstick NEGATIVE  NEGATIVE   Bilirubin Urine NEGATIVE  NEGATIVE   Ketones, ur NEGATIVE  NEGATIVE mg/dL   Protein, ur NEGATIVE  NEGATIVE mg/dL   Urobilinogen, UA 0.2  0.0 - 1.0 mg/dL   Nitrite NEGATIVE  NEGATIVE   Leukocytes, UA NEGATIVE  NEGATIVE   Comment: MICROSCOPIC NOT DONE ON URINES WITH NEGATIVE PROTEIN, BLOOD, LEUKOCYTES, NITRITE, OR GLUCOSE <1000 mg/dL.  CBC WITH DIFFERENTIAL     Status: Abnormal   Collection Time    12/22/12  5:55 AM      Result Value Range   WBC 9.3  4.0 - 10.5 K/uL   RBC 5.60  4.22 - 5.81 MIL/uL   Hemoglobin 17.8 (*) 13.0 - 17.0 g/dL   HCT 84.1 (*) 32.4 - 40.1 %   MCV 93.4  78.0 - 100.0 fL   MCH 31.8  26.0 - 34.0 pg   MCHC 34.0  30.0 - 36.0 g/dL   RDW 02.7  25.3 - 66.4 %   Platelets 176  150 - 400 K/uL   Neutrophils Relative % 62  43 - 77 %   Neutro Abs 5.7  1.7 - 7.7 K/uL   Lymphocytes Relative 27  12 - 46 %   Lymphs Abs 2.5  0.7 - 4.0 K/uL   Monocytes Relative 10  3 - 12 %   Monocytes Absolute 0.9  0.1 - 1.0 K/uL   Eosinophils Relative 2  0 - 5 %   Eosinophils Absolute 0.2  0.0 - 0.7 K/uL   Basophils Relative 1  0 - 1 %   Basophils Absolute 0.1  0.0 - 0.1 K/uL  URINE CULTURE      Status: None   Collection Time    12/22/12  2:40 PM      Result Value Range   Specimen Description URINE, CLEAN CATCH     Special Requests NONE     Culture  Setup Time 12/22/2012 22:20     Colony Count NO GROWTH     Culture NO GROWTH     Report Status 12/23/2012 FINAL    CBC     Status: Abnormal   Collection Time    12/23/12  5:30 AM      Result Value Range   WBC 16.9 (*) 4.0 - 10.5 K/uL   RBC 5.65  4.22 - 5.81 MIL/uL   Hemoglobin 18.5 (*) 13.0 - 17.0 g/dL   HCT 40.3 (*) 47.4 - 25.9 %   MCV 93.1  78.0 - 100.0 fL   MCH 32.7  26.0 - 34.0 pg   MCHC 35.2  30.0 -  36.0 g/dL   RDW 16.1  09.6 - 04.5 %   Platelets 181  150 - 400 K/uL  BASIC METABOLIC PANEL     Status: Abnormal   Collection Time    12/23/12  5:30 AM      Result Value Range   Sodium 145  135 - 145 mEq/L   Potassium 4.6  3.5 - 5.1 mEq/L   Chloride 108  96 - 112 mEq/L   CO2 29  19 - 32 mEq/L   Glucose, Bld 131 (*) 70 - 99 mg/dL   BUN 19  6 - 23 mg/dL   Creatinine, Ser 4.09  0.50 - 1.35 mg/dL   Calcium 81.1  8.4 - 91.4 mg/dL   GFR calc non Af Amer >90  >90 mL/min   GFR calc Af Amer >90  >90 mL/min   Comment:            The eGFR has been calculated     using the CKD EPI equation.     This calculation has not been     validated in all clinical     situations.     eGFR's persistently     <90 mL/min signify     possible Chronic Kidney Disease.      Prior drain sites closed/staples. No drainage.Eyes: EOM are normal.  Neck: Neck supple. No JVD present. No tracheal deviation present. No thyromegaly present.  Cardiovascular: Normal rate and regular rhythm. No murmurs rubs or galloops  Pulmonary/Chest: Breath sounds normal. No respiratory distress. No wheezes or rales or rhonchi  Abdominal: Soft. Bowel sounds are normal. He exhibits no distension. There is no tenderness.  Musculoskeletal: He exhibits no edema.  Lymphadenopathy:  He has no cervical adenopathy.  Neurological: He is lethargic  Patient was able to  provide his name . Patient with limited awareness of his deficits. He did follow simple commands.  He needed cues to maintain arousal. Moved all 4's with 4 to 4+/5 strength with. No gross sensory deficits. A little impulsive. No gross limb ataxia. Senses pain and LT in all 4 limbs.    Assessment/Plan: 1. Functional deficits secondary to [facility] due to ruptured ACOM aneurysm which require 3+ hours per day of interdisciplinary therapy in a comprehensive inpatient rehab setting. Physiatrist is providing close team supervision and 24 hour management of active medical problems listed below. Physiatrist and rehab team continue to assess barriers to discharge/monitor patient progress toward functional and medical goals.  FIM: FIM - Bathing Bathing Steps Patient Completed: Chest;Right Arm;Left Arm;Abdomen;Front perineal area;Buttocks;Right upper leg;Left upper leg;Right lower leg (including foot);Left lower leg (including foot) Bathing: 4: Steadying assist  FIM - Upper Body Dressing/Undressing Upper body dressing/undressing steps patient completed: Thread/unthread right sleeve of pullover shirt/dresss;Thread/unthread left sleeve of pullover shirt/dress;Put head through opening of pull over shirt/dress;Pull shirt over trunk Upper body dressing/undressing: 5: Set-up assist to: Obtain clothing/put away FIM - Lower Body Dressing/Undressing Lower body dressing/undressing steps patient completed: Pull underwear up/down;Thread/unthread right pants leg;Thread/unthread left pants leg;Pull pants up/down;Don/Doff left sock;Don/Doff right sock Lower body dressing/undressing: 4: Min-Patient completed 75 plus % of tasks  FIM - Toileting Toileting steps completed by patient: Adjust clothing prior to toileting;Performs perineal hygiene;Adjust clothing after toileting Toileting: 4: Steadying assist  FIM - Diplomatic Services operational officer Devices: Elevated toilet seat Toilet Transfers: 4-To toilet/BSC: Min A  (steadying Pt. > 75%)  FIM - Bed/Chair Transfer Bed/Chair Transfer Assistive Devices: Therapist, occupational: 5: Supine > Sit: Supervision (verbal cues/safety issues);4: Chair  or W/C > Bed: Min A (steadying Pt. > 75%)  FIM - Locomotion: Wheelchair Locomotion: Wheelchair: 2: Travels 50 - 149 ft with supervision, cueing or coaxing FIM - Locomotion: Ambulation Locomotion: Ambulation Assistive Devices: Designer, industrial/product Ambulation/Gait Assistance: 4: Min assist Locomotion: Ambulation: 4: Travels 150 ft or more with minimal assistance (Pt.>75%)  Comprehension Comprehension Mode: Auditory Comprehension: 3-Understands basic 50 - 74% of the time/requires cueing 25 - 50%  of the time  Expression Expression Mode: Verbal Expression: 3-Expresses basic 50 - 74% of the time/requires cueing 25 - 50% of the time. Needs to repeat parts of sentences.  Social Interaction Social Interaction: 3-Interacts appropriately 50 - 74% of the time - May be physically or verbally inappropriate.  Problem Solving Problem Solving: 1-Solves basic less than 25% of the time - needs direction nearly all the time or does not effectively solve problems and may need a restraint for safety  Memory Memory: 1-Recognizes or recalls less than 25% of the time/requires cueing greater than 75% of the time   Medical Problem List and Plan:  1. Right SAH due to ACOM aneurysm. Status post coiling and IVC drain  2. DVT Prophylaxis/Anticoagulation: SCDs. Monitor for any signs of DVT  3. Pain Management: Oxycodone as needed. Monitor with increased mobility  4. Mood/delirium. Bed alarm for safety. Followup speech therapy for cognition  -showing improvement but has poor judgement and insight.  -fall safety planning 5. Neuropsych: This patient is not capable of making decisions on his/her own behalf.  6. Hyponatremia. Resolved. Off sodium and demeclocycline  7. Hypertension. Discuss plan to taper nimodipine with IR 8.  Leukocytosis  without fever or symptoms, UA clean--- check culture  - afebrile  LOS (Days) 7 A FACE TO FACE EVALUATION WAS PERFORMED  Dennis Wyatt T 12/24/2012, 8:24 AM   Patient ID: Dennis Wyatt, male   DOB: 01/05/1961, 52 y.o.   MRN: [id number] Subjective/Complaints: 52 y.o. right-handed male with history of hypertension. Admitted 12/03/2012 with headache and dizziness. Blood pressure noted to be 204/123. Cranial CT scan showed subarachnoid hemorrhage with concern for ruptured aneurysm. Four-vessel cerebral arteriogram showed 11 mm x 7 mm irregular right ACA A1 aneurysm. Interventional radiology consulted and patient underwent coiling of aneurysm, IVC drain. Patient did remain intubated for a time followed by critical care medicine. Latest followup cranial CT scan 12/13/2012 stable and external ventricular drain has been removed. Close monitoring of blood pressure as patient remained on nimodipine x21 day protocol and await plan to taper . He is tolerating a regular consistency diet. Patient with bouts of hyponatremia 115 and placed on Declomycin as well as sodium chloride tablets with latest followup sodium level 135 on 12/16/2012  Patient is sleepy this morning, arouses to verbal. States name, incorrectly states place, unable to state day or date Review of Systems  Unable to perform ROS: mental acuity    Objective: Vital Signs: Blood pressure 142/62, pulse 58, temperature 98 F (36.7 C), temperature source Oral, resp. rate 18, weight 100.9 kg (222 lb 7.1 oz), SpO2 96.00%. No results found. Results for orders placed during the hospital encounter of 12/17/12 (from the past 72 hour(s))  URINALYSIS, ROUTINE W REFLEX MICROSCOPIC     Status: None   Collection Time    12/21/12  6:01 PM      Result Value Range   Color, Urine YELLOW  YELLOW   APPearance CLEAR  CLEAR   Specific Gravity, Urine 1.021  1.005 - 1.030   pH 5.5  5.0 - 8.0  Glucose, UA NEGATIVE  NEGATIVE mg/dL   Hgb urine dipstick NEGATIVE   NEGATIVE   Bilirubin Urine NEGATIVE  NEGATIVE   Ketones, ur NEGATIVE  NEGATIVE mg/dL   Protein, ur NEGATIVE  NEGATIVE mg/dL   Urobilinogen, UA 0.2  0.0 - 1.0 mg/dL   Nitrite NEGATIVE  NEGATIVE   Leukocytes, UA NEGATIVE  NEGATIVE   Comment: MICROSCOPIC NOT DONE ON URINES WITH NEGATIVE PROTEIN, BLOOD, LEUKOCYTES, NITRITE, OR GLUCOSE <1000 mg/dL.  CBC WITH DIFFERENTIAL     Status: Abnormal   Collection Time    12/22/12  5:55 AM      Result Value Range   WBC 9.3  4.0 - 10.5 K/uL   RBC 5.60  4.22 - 5.81 MIL/uL   Hemoglobin 17.8 (*) 13.0 - 17.0 g/dL   HCT 45.4 (*) 09.8 - 11.9 %   MCV 93.4  78.0 - 100.0 fL   MCH 31.8  26.0 - 34.0 pg   MCHC 34.0  30.0 - 36.0 g/dL   RDW 14.7  82.9 - 56.2 %   Platelets 176  150 - 400 K/uL   Neutrophils Relative % 62  43 - 77 %   Neutro Abs 5.7  1.7 - 7.7 K/uL   Lymphocytes Relative 27  12 - 46 %   Lymphs Abs 2.5  0.7 - 4.0 K/uL   Monocytes Relative 10  3 - 12 %   Monocytes Absolute 0.9  0.1 - 1.0 K/uL   Eosinophils Relative 2  0 - 5 %   Eosinophils Absolute 0.2  0.0 - 0.7 K/uL   Basophils Relative 1  0 - 1 %   Basophils Absolute 0.1  0.0 - 0.1 K/uL  URINE CULTURE     Status: None   Collection Time    12/22/12  2:40 PM      Result Value Range   Specimen Description URINE, CLEAN CATCH     Special Requests NONE     Culture  Setup Time 12/22/2012 22:20     Colony Count NO GROWTH     Culture NO GROWTH     Report Status 12/23/2012 FINAL    CBC     Status: Abnormal   Collection Time    12/23/12  5:30 AM      Result Value Range   WBC 16.9 (*) 4.0 - 10.5 K/uL   RBC 5.65  4.22 - 5.81 MIL/uL   Hemoglobin 18.5 (*) 13.0 - 17.0 g/dL   HCT 13.0 (*) 86.5 - 78.4 %   MCV 93.1  78.0 - 100.0 fL   MCH 32.7  26.0 - 34.0 pg   MCHC 35.2  30.0 - 36.0 g/dL   RDW 69.6  29.5 - 28.4 %   Platelets 181  150 - 400 K/uL  BASIC METABOLIC PANEL     Status: Abnormal   Collection Time    12/23/12  5:30 AM      Result Value Range   Sodium 145  135 - 145 mEq/L   Potassium  4.6  3.5 - 5.1 mEq/L   Chloride 108  96 - 112 mEq/L   CO2 29  19 - 32 mEq/L   Glucose, Bld 131 (*) 70 - 99 mg/dL   BUN 19  6 - 23 mg/dL   Creatinine, Ser 1.32  0.50 - 1.35 mg/dL   Calcium 44.0  8.4 - 10.2 mg/dL   GFR calc non Af Amer >90  >90 mL/min   GFR calc Af Amer >90  >90 mL/min  Comment:            The eGFR has been calculated     using the CKD EPI equation.     This calculation has not been     validated in all clinical     situations.     eGFR's persistently     <90 mL/min signify     possible Chronic Kidney Disease.      Prior drain sites closed/staples. No drainage.Eyes: EOM are normal.  Neck: Neck supple. No JVD present. No tracheal deviation present. No thyromegaly present.  Cardiovascular: Normal rate and regular rhythm. No murmurs rubs or galloops  Pulmonary/Chest: Breath sounds normal. No respiratory distress. No wheezes or rales or rhonchi  Abdominal: Soft. Bowel sounds are normal. He exhibits no distension. There is no tenderness.  Musculoskeletal: He exhibits no edema.  Lymphadenopathy:  He has no cervical adenopathy.  Neurological: He is lethargic  Patient was able to provide his name . Patient with limited awareness of his deficits. He did follow simple commands.  He needed cues to maintain arousal. Moved all 4's with 4 to 4+/5 strength with. No gross sensory deficits. A little impulsive. No gross limb ataxia. Senses pain and LT in all 4 limbs.    Assessment/Plan: 1. Functional deficits secondary to [facility] due to ruptured ACOM aneurysm which require 3+ hours per day of interdisciplinary therapy in a comprehensive inpatient rehab setting. Physiatrist is providing close team supervision and 24 hour management of active medical problems listed below. Physiatrist and rehab team continue to assess barriers to discharge/monitor patient progress toward functional and medical goals. FIM: FIM - Bathing Bathing Steps Patient Completed: Chest;Right Arm;Left  Arm;Abdomen;Front perineal area;Buttocks;Right upper leg;Left upper leg;Right lower leg (including foot);Left lower leg (including foot) Bathing: 4: Steadying assist  FIM - Upper Body Dressing/Undressing Upper body dressing/undressing steps patient completed: Thread/unthread right sleeve of pullover shirt/dresss;Thread/unthread left sleeve of pullover shirt/dress;Put head through opening of pull over shirt/dress;Pull shirt over trunk Upper body dressing/undressing: 5: Set-up assist to: Obtain clothing/put away FIM - Lower Body Dressing/Undressing Lower body dressing/undressing steps patient completed: Pull underwear up/down;Thread/unthread right pants leg;Thread/unthread left pants leg;Pull pants up/down;Don/Doff left sock;Don/Doff right sock Lower body dressing/undressing: 4: Min-Patient completed 75 plus % of tasks  FIM - Toileting Toileting steps completed by patient: Adjust clothing prior to toileting;Performs perineal hygiene;Adjust clothing after toileting Toileting: 4: Steadying assist  FIM - Diplomatic Services operational officer Devices: Elevated toilet seat Toilet Transfers: 4-To toilet/BSC: Min A (steadying Pt. > 75%)  FIM - Bed/Chair Transfer Bed/Chair Transfer Assistive Devices: Therapist, occupational: 5: Supine > Sit: Supervision (verbal cues/safety issues);4: Chair or W/C > Bed: Min A (steadying Pt. > 75%)  FIM - Locomotion: Wheelchair Locomotion: Wheelchair: 2: Travels 50 - 149 ft with supervision, cueing or coaxing FIM - Locomotion: Ambulation Locomotion: Ambulation Assistive Devices: Designer, industrial/product Ambulation/Gait Assistance: 4: Min assist Locomotion: Ambulation: 4: Travels 150 ft or more with minimal assistance (Pt.>75%)  Comprehension Comprehension Mode: Auditory Comprehension: 3-Understands basic 50 - 74% of the time/requires cueing 25 - 50%  of the time  Expression Expression Mode: Verbal Expression: 3-Expresses basic 50 - 74% of the time/requires  cueing 25 - 50% of the time. Needs to repeat parts of sentences.  Social Interaction Social Interaction: 3-Interacts appropriately 50 - 74% of the time - May be physically or verbally inappropriate.  Problem Solving Problem Solving: 1-Solves basic less than 25% of the time - needs direction nearly all the time or does  not effectively solve problems and may need a restraint for safety  Memory Memory: 1-Recognizes or recalls less than 25% of the time/requires cueing greater than 75% of the time Medical Problem List and Plan:  1. Right SAH due to ACOM aneurysm. Status post coiling and IVC drain  2. DVT Prophylaxis/Anticoagulation: SCDs. Monitor for any signs of DVT  3. Pain Management: Oxycodone as needed. Monitor with increased mobility   -topamax 25mg  qhs to start tonight 4. Mood/delirium. Bed alarm for safety. Followup speech therapy for cognition  -showing improvement but has poor judgement and insight.  -is a fall risk----fall safety planning  -ritalin for attention/focus 5. Neuropsych: This patient is not capable of making decisions on his/her own behalf.  6. Hyponatremia. Resolved.  off Declomycin and  sodium chloride tablets. Followup chemistries  7. Hypertension. Discuss plan to taper nimodipine with IR 8.  Leukocytosis without fever or symptoms, ua negative, culture neg, monitor CBC for trend  - check dopplers today LOS (Days) 7 A FACE TO FACE EVALUATION WAS PERFORMED  Dennis Wyatt T 12/24/2012, 8:24 AM

## 2012-12-24 NOTE — Progress Notes (Signed)
Occupational Therapy Session Note  Patient Details  Name: Dennis Wyatt MRN: [id number] Date of Birth: 13-Nov-1960  Today's Date: 12/24/2012  Session 1 Time: 0700-0757 Time Calculation (min): 57 min  Short Term Goals: Week 1:  OT Short Term Goal 1 (Week 1): Patient will identify need to void, and wait for assistance to arrive OT Short Term Goal 2 (Week 1): Patient will transfer to toilet with close supervision OT Short Term Goal 3 (Week 1): Patient will dress lower body with min assist OT Short Term Goal 4 (Week 1): Patient will complete steps of grooming tasks with only initial verbal directive, or environmental cue  Skilled Therapeutic Interventions/Progress Updates:    Pt engaged in bathing at shower level and dressing sit<>stand from w/c.  Pt amb with RW to bathroom to sit on toilet with no success before transferring to shower seat.  Pt required min verbal cues for to problem solve RW positioning to sit on shower chair.  While standing in shower patient c/o numbness in BLE and stated he needed to sit down.  Pt transferred to w/c after shower and rolled into room to complete dressing.  Pt continued to c/o numbness but was able to move BLE and perform sit<>stand and standing with steady A.  When questioned pt stated he was in [place] but when told he was in [place] pt remembered he was at [facility] because he had an aneurysm.  Focus on standing balance, orientation, sequencing, attention to task, and safety awareness.  Therapy Documentation Precautions:  Precautions Precautions: Fall Precaution Comments: confused, disoriented, light headed Restrictions Weight Bearing Restrictions: No Pain: Pain Assessment Pain Assessment: 0-10 Pain Score:   8 Pain Type: Acute pain Pain Location: Head Pain Orientation: Anterior Pain Descriptors / Indicators: Aching Pain Frequency: Intermittent Pain Onset: On-going Patients Stated Pain Goal: 2 Pain Intervention(s): RN made  aware  See FIM for current functional status  Therapy/Group: Individual Therapy  Session 2 Time: 1330-1415 Pt c/o 7/10 headache; RN aware Individual Therapy  Pt resting in bed with niece at bedside.  Pt required verbal cues and tactile cues to sit EOB to change clothing.  Pt required min A for sit->stand from EOB before walking to day room to weigh on scales.  Pt engaged in dynamic standing tasks and functional amb with RW for home mgmt tasks.  Pt requires max verbal cues for RW safety.  Kinesio tape applied to cervical region for headache relief.  Pt returned to bed with niece present and bed alarm on.  Viraj, Liby [facility] 12/24/2012, 8:00 AM

## 2012-12-25 LAB — GLUCOSE, CAPILLARY: Glucose-Capillary: 112 mg/dL — ABNORMAL HIGH (ref 70–99)

## 2012-12-25 MED ORDER — NICOTINE 21 MG/24HR TD PT24
21.0000 mg | MEDICATED_PATCH | Freq: Every day | TRANSDERMAL | Status: DC
  Administered 2012-12-25 – 2012-12-31 (×7): 21 mg via TRANSDERMAL
  Filled 2012-12-25 (×10): qty 1

## 2012-12-25 NOTE — Plan of Care (Signed)
Problem: Consults Goal: Diabetes Guidelines if Diabetic/Glucose > 140 If diabetic or lab glucose is > 140 mg/dl - Initiate Diabetes/Hyperglycemia Guidelines & Document Interventions  Outcome: Not Applicable Date Met:  12/25/12 Patient is not diabetic

## 2012-12-25 NOTE — Progress Notes (Signed)
Patient ID: Dennis Wyatt, male   DOB: [birth year], 51 y.o.   MRN: [id number] Subjective/Complaints:  Headache at night. Feels ok now. Review of Systems  Unable to perform ROS: mental acuity    Objective: Vital Signs: Blood pressure 153/78, pulse 64, temperature 98 F (36.7 C), temperature source Oral, resp. rate 17, weight 100.9 kg (222 lb 7.1 oz), SpO2 95.00%. No results found. Results for orders placed during the hospital encounter of 12/17/12 (from the past 72 hour(s))  URINE CULTURE     Status: None   Collection Time    12/22/12  2:40 PM      Result Value Range   Specimen Description URINE, CLEAN CATCH     Special Requests NONE     Culture  Setup Time 12/22/2012 22:20     Colony Count NO GROWTH     Culture NO GROWTH     Report Status 12/23/2012 FINAL    CBC     Status: Abnormal   Collection Time    12/23/12  5:30 AM      Result Value Range   WBC 16.9 (*) 4.0 - 10.5 K/uL   RBC 5.65  4.22 - 5.81 MIL/uL   Hemoglobin 18.5 (*) 13.0 - 17.0 g/dL   HCT 40.9 (*) 81.1 - 91.4 %   MCV 93.1  78.0 - 100.0 fL   MCH 32.7  26.0 - 34.0 pg   MCHC 35.2  30.0 - 36.0 g/dL   RDW 78.2  95.6 - 21.3 %   Platelets 181  150 - 400 K/uL  BASIC METABOLIC PANEL     Status: Abnormal   Collection Time    12/23/12  5:30 AM      Result Value Range   Sodium 145  135 - 145 mEq/L   Potassium 4.6  3.5 - 5.1 mEq/L   Chloride 108  96 - 112 mEq/L   CO2 29  19 - 32 mEq/L   Glucose, Bld 131 (*) 70 - 99 mg/dL   BUN 19  6 - 23 mg/dL   Creatinine, Ser 0.86  0.50 - 1.35 mg/dL   Calcium 57.8  8.4 - 46.9 mg/dL   GFR calc non Af Amer >90  >90 mL/min   GFR calc Af Amer >90  >90 mL/min   Comment:            The eGFR has been calculated     using the CKD EPI equation.     This calculation has not been     validated in all clinical     situations.     eGFR's persistently     <90 mL/min signify     possible Chronic Kidney Disease.      Prior drain sites closed/staples. No drainage.Eyes: EOM are normal.   Neck: Neck supple. No JVD present. No tracheal deviation present. No thyromegaly present.  Cardiovascular: Normal rate and regular rhythm. No murmurs rubs or galloops  Pulmonary/Chest: Breath sounds normal. No respiratory distress. No wheezes or rales or rhonchi  Abdominal: Soft. Bowel sounds are normal. He exhibits no distension. There is no tenderness.  Musculoskeletal: He exhibits no edema.  Lymphadenopathy:  He has no cervical adenopathy.  Neurological: He is lethargic  Patient was able to provide his name . Patient with limited awareness of his deficits. He did follow simple commands.  He needed cues to maintain arousal. Moved all 4's with 4 to 4+/5 strength with. No gross sensory deficits. A little impulsive. No gross limb ataxia. Senses  pain and LT in all 4 limbs.    Assessment/Plan: 1. Functional deficits secondary to [facility] due to ruptured ACOM aneurysm which require 3+ hours per day of interdisciplinary therapy in a comprehensive inpatient rehab setting. Physiatrist is providing close team supervision and 24 hour management of active medical problems listed below. Physiatrist and rehab team continue to assess barriers to discharge/monitor patient progress toward functional and medical goals.  FIM: FIM - Bathing Bathing Steps Patient Completed: Chest;Right Arm;Left Arm;Abdomen;Front perineal area;Buttocks;Right upper leg;Left upper leg;Right lower leg (including foot);Left lower leg (including foot) Bathing: 4: Steadying assist  FIM - Upper Body Dressing/Undressing Upper body dressing/undressing steps patient completed: Thread/unthread right sleeve of pullover shirt/dresss;Thread/unthread left sleeve of pullover shirt/dress;Put head through opening of pull over shirt/dress;Pull shirt over trunk Upper body dressing/undressing: 5: Set-up assist to: Obtain clothing/put away FIM - Lower Body Dressing/Undressing Lower body dressing/undressing steps patient completed: Pull underwear  up/down;Thread/unthread right pants leg;Thread/unthread left pants leg;Pull pants up/down;Don/Doff left sock;Don/Doff right sock Lower body dressing/undressing: 4: Min-Patient completed 75 plus % of tasks  FIM - Toileting Toileting steps completed by patient: Adjust clothing prior to toileting;Performs perineal hygiene;Adjust clothing after toileting Toileting: 4: Steadying assist  FIM - Diplomatic Services operational officer Devices: Building control surveyor Transfers: 4-To toilet/BSC: Min A (steadying Pt. > 75%);4-From toilet/BSC: Min A (steadying Pt. > 75%)  FIM - Bed/Chair Transfer Bed/Chair Transfer Assistive Devices: Therapist, occupational: 5: Supine > Sit: Supervision (verbal cues/safety issues);4: Chair or W/C > Bed: Min A (steadying Pt. > 75%)  FIM - Locomotion: Wheelchair Locomotion: Wheelchair: 2: Travels 50 - 149 ft with supervision, cueing or coaxing FIM - Locomotion: Ambulation Locomotion: Ambulation Assistive Devices: Designer, industrial/product Ambulation/Gait Assistance: 4: Min assist Locomotion: Ambulation: 4: Travels 150 ft or more with minimal assistance (Pt.>75%)  Comprehension Comprehension Mode: Auditory Comprehension: 4-Understands basic 75 - 89% of the time/requires cueing 10 - 24% of the time  Expression Expression Mode: Verbal Expression: 4-Expresses basic 75 - 89% of the time/requires cueing 10 - 24% of the time. Needs helper to occlude trach/needs to repeat words.  Social Interaction Social Interaction: 3-Interacts appropriately 50 - 74% of the time - May be physically or verbally inappropriate.  Problem Solving Problem Solving: 2-Solves basic 25 - 49% of the time - needs direction more than half the time to initiate, plan or complete simple activities  Memory Memory: 2-Recognizes or recalls 25 - 49% of the time/requires cueing 51 - 75% of the time   Medical Problem List and Plan:  1. Right SAH due to ACOM aneurysm. Status post coiling and IVC  drain  2. DVT Prophylaxis/Anticoagulation: SCDs. Monitor for any signs of DVT  3. Pain Management: Oxycodone as needed. Monitor with increased mobility  4. Mood/delirium. Bed alarm for safety. Followup speech therapy for cognition  -showing improvement but has poor judgement and insight.  -fall safety planning 5. Neuropsych: This patient is not capable of making decisions on his/her own behalf.  6. Hyponatremia. Resolved. Off sodium and demeclocycline  7. Hypertension. Discuss plan to taper nimodipine with IR 8.  Leukocytosis without fever or symptoms, UA clean--- check culture  - afebrile  LOS (Days) 8 A FACE TO FACE EVALUATION WAS PERFORMED  , T 12/25/2012, 8:40 AM   Patient ID: Dennis Wyatt, male   DOB: [birth year], 52 y.o.   MRN: [id number] Subjective/Complaints: 52 y.o. right-handed male with history of hypertension. Admitted 12/03/2012 with headache and dizziness. Blood pressure noted to be 204/123. Cranial CT scan showed  subarachnoid hemorrhage with concern for ruptured aneurysm. Four-vessel cerebral arteriogram showed 11 mm x 7 mm irregular right ACA A1 aneurysm. Interventional radiology consulted and patient underwent coiling of aneurysm, IVC drain. Patient did remain intubated for a time followed by critical care medicine. Latest followup cranial CT scan 12/13/2012 stable and external ventricular drain has been removed. Close monitoring of blood pressure as patient remained on nimodipine x21 day protocol and await plan to taper . He is tolerating a regular consistency diet. Patient with bouts of hyponatremia 115 and placed on Declomycin as well as sodium chloride tablets with latest followup sodium level 135 on 12/16/2012  Patient is sleepy this morning, arouses to verbal. States name, incorrectly states place, unable to state day or date Review of Systems  Unable to perform ROS: mental acuity    Objective: Vital Signs: Blood pressure 153/78, pulse 64, temperature 98  F (36.7 C), temperature source Oral, resp. rate 17, weight 100.9 kg (222 lb 7.1 oz), SpO2 95.00%. No results found. Results for orders placed during the hospital encounter of 12/17/12 (from the past 72 hour(s))  URINE CULTURE     Status: None   Collection Time    12/22/12  2:40 PM      Result Value Range   Specimen Description URINE, CLEAN CATCH     Special Requests NONE     Culture  Setup Time 12/22/2012 22:20     Colony Count NO GROWTH     Culture NO GROWTH     Report Status 12/23/2012 FINAL    CBC     Status: Abnormal   Collection Time    12/23/12  5:30 AM      Result Value Range   WBC 16.9 (*) 4.0 - 10.5 K/uL   RBC 5.65  4.22 - 5.81 MIL/uL   Hemoglobin 18.5 (*) 13.0 - 17.0 g/dL   HCT 84.1 (*) 32.4 - 40.1 %   MCV 93.1  78.0 - 100.0 fL   MCH 32.7  26.0 - 34.0 pg   MCHC 35.2  30.0 - 36.0 g/dL   RDW 02.7  25.3 - 66.4 %   Platelets 181  150 - 400 K/uL  BASIC METABOLIC PANEL     Status: Abnormal   Collection Time    12/23/12  5:30 AM      Result Value Range   Sodium 145  135 - 145 mEq/L   Potassium 4.6  3.5 - 5.1 mEq/L   Chloride 108  96 - 112 mEq/L   CO2 29  19 - 32 mEq/L   Glucose, Bld 131 (*) 70 - 99 mg/dL   BUN 19  6 - 23 mg/dL   Creatinine, Ser 4.03  0.50 - 1.35 mg/dL   Calcium 47.4  8.4 - 25.9 mg/dL   GFR calc non Af Amer >90  >90 mL/min   GFR calc Af Amer >90  >90 mL/min   Comment:            The eGFR has been calculated     using the CKD EPI equation.     This calculation has not been     validated in all clinical     situations.     eGFR's persistently     <90 mL/min signify     possible Chronic Kidney Disease.      Prior drain sites closed/staples. No drainage.Eyes: EOM are normal.  Neck: Neck supple. No JVD present. No tracheal deviation present. No thyromegaly present.  Cardiovascular: Normal rate  and regular rhythm. No murmurs rubs or galloops  Pulmonary/Chest: Breath sounds normal. No respiratory distress. No wheezes or rales or rhonchi   Abdominal: Soft. Bowel sounds are normal. He exhibits no distension. There is no tenderness.  Musculoskeletal: He exhibits no edema.  Lymphadenopathy:  He has no cervical adenopathy.  Neurological: He is lethargic  Patient was able to provide his name . Patient with limited awareness of his deficits. He did follow simple commands.  He needed cues to maintain arousal. Moved all 4's with 4 to 4+/5 strength with. No gross sensory deficits. A little impulsive. No gross limb ataxia. Senses pain and LT in all 4 limbs.    Assessment/Plan: 1. Functional deficits secondary to [facility] due to ruptured ACOM aneurysm which require 3+ hours per day of interdisciplinary therapy in a comprehensive inpatient rehab setting. Physiatrist is providing close team supervision and 24 hour management of active medical problems listed below. Physiatrist and rehab team continue to assess barriers to discharge/monitor patient progress toward functional and medical goals. FIM: FIM - Bathing Bathing Steps Patient Completed: Chest;Right Arm;Left Arm;Abdomen;Front perineal area;Buttocks;Right upper leg;Left upper leg;Right lower leg (including foot);Left lower leg (including foot) Bathing: 4: Steadying assist  FIM - Upper Body Dressing/Undressing Upper body dressing/undressing steps patient completed: Thread/unthread right sleeve of pullover shirt/dresss;Thread/unthread left sleeve of pullover shirt/dress;Put head through opening of pull over shirt/dress;Pull shirt over trunk Upper body dressing/undressing: 5: Set-up assist to: Obtain clothing/put away FIM - Lower Body Dressing/Undressing Lower body dressing/undressing steps patient completed: Pull underwear up/down;Thread/unthread right pants leg;Thread/unthread left pants leg;Pull pants up/down;Don/Doff left sock;Don/Doff right sock Lower body dressing/undressing: 4: Min-Patient completed 75 plus % of tasks  FIM - Toileting Toileting steps completed by patient: Adjust  clothing prior to toileting;Performs perineal hygiene;Adjust clothing after toileting Toileting: 4: Steadying assist  FIM - Diplomatic Services operational officer Devices: Building control surveyor Transfers: 4-To toilet/BSC: Min A (steadying Pt. > 75%);4-From toilet/BSC: Min A (steadying Pt. > 75%)  FIM - Bed/Chair Transfer Bed/Chair Transfer Assistive Devices: Therapist, occupational: 5: Supine > Sit: Supervision (verbal cues/safety issues);4: Chair or W/C > Bed: Min A (steadying Pt. > 75%)  FIM - Locomotion: Wheelchair Locomotion: Wheelchair: 2: Travels 50 - 149 ft with supervision, cueing or coaxing FIM - Locomotion: Ambulation Locomotion: Ambulation Assistive Devices: Designer, industrial/product Ambulation/Gait Assistance: 4: Min assist Locomotion: Ambulation: 4: Travels 150 ft or more with minimal assistance (Pt.>75%)  Comprehension Comprehension Mode: Auditory Comprehension: 4-Understands basic 75 - 89% of the time/requires cueing 10 - 24% of the time  Expression Expression Mode: Verbal Expression: 4-Expresses basic 75 - 89% of the time/requires cueing 10 - 24% of the time. Needs helper to occlude trach/needs to repeat words.  Social Interaction Social Interaction: 3-Interacts appropriately 50 - 74% of the time - May be physically or verbally inappropriate.  Problem Solving Problem Solving: 2-Solves basic 25 - 49% of the time - needs direction more than half the time to initiate, plan or complete simple activities  Memory Memory: 2-Recognizes or recalls 25 - 49% of the time/requires cueing 51 - 75% of the time Medical Problem List and Plan:  1. Right SAH due to ACOM aneurysm. Status post coiling and IVC drain  2. DVT Prophylaxis/Anticoagulation: SCDs.   -popliteal, peroneal dvt's---xarelto initiated.  -recheck dopplers in a month 3. Pain Management: Oxycodone as needed. Monitor with increased mobility   -topamax 25mg  qhs initiated 4. Mood/delirium. Bed alarm for  safety. Followup speech therapy for cognition  -showing improvement but has  poor judgement and insight.  -is a fall risk----fall safety planning  -ritalin for attention/focus 5. Neuropsych: This patient is not capable of making decisions on his/her own behalf.  6. Hyponatremia. Resolved.  off Declomycin and  sodium chloride tablets. Followup chemistries  7. Hypertension. Discuss plan to taper nimodipine with IR 8.  Leukocytosis without fever or symptoms, ua negative, culture neg, monitor CBC for trend  - +DVT LOS (Days) 8 A FACE TO FACE EVALUATION WAS PERFORMED  , T 12/25/2012, 8:40 AM

## 2012-12-25 NOTE — Plan of Care (Signed)
Problem: RH BOWEL ELIMINATION Goal: RH STG MANAGE BOWEL W/MEDICATION W/ASSISTANCE STG Manage Bowel with Medication with min Assistance.  Outcome: Not Applicable Date Met:  12/25/12 Patient is not currently taking scheduled laxatives   Problem: RH PAIN MANAGEMENT Goal: RH STG PAIN MANAGED AT OR BELOW PT'S PAIN GOAL <3  Outcome: Not Progressing Pain levels 8-9, Oxy IR 10 mg given q 4 hours

## 2012-12-26 ENCOUNTER — Inpatient Hospital Stay (HOSPITAL_COMMUNITY): Payer: No Typology Code available for payment source | Admitting: Physical Therapy

## 2012-12-26 MED ORDER — TOPIRAMATE 25 MG PO TABS
50.0000 mg | ORAL_TABLET | Freq: Every day | ORAL | Status: DC
  Administered 2012-12-26 – 2012-12-31 (×6): 50 mg via ORAL
  Filled 2012-12-26 (×7): qty 2

## 2012-12-26 NOTE — Progress Notes (Addendum)
Physical Therapy Note  Patient Details  Name: Dennis Wyatt MRN: [id number] Date of Birth: February 17, 1961 Today's Date: 12/26/2012  [id number] (40 minutes) individual Pain: 5/10 headache /nurse notified Focus of treatment: Therapeutic activities focused on dressing/toileting Treatment: Pt in bed upon arrival incontinent of urine; supine to sit with vcs for use of bedrail; transfer stand/turn RW min assist with tactile cues for hand placement; wc >< toilet stand/turn with VCS for brakes, tactile cues to use safety rail in bathroom; pt required min assist for hygiene; pt able to wash hands, brush teeth with setup only. Pt returned to nurses station with quick release belt in place. Pt looking for cigarettes and car keys.    Melania Kirks,JIM 12/26/2012, 10:24 AM

## 2012-12-26 NOTE — Plan of Care (Signed)
Problem: RH PAIN MANAGEMENT Goal: RH STG PAIN MANAGED AT OR BELOW PT'S PAIN GOAL <3  Outcome: Not Progressing Pain level 8 or 10, OXY IR given Q 4hours PRN

## 2012-12-26 NOTE — Progress Notes (Signed)
Patient ID: Dennis Wyatt, male   DOB: 1961/06/18, 52 y.o.   MRN: [id number] Subjective/Complaints:  Headache at night. Feels ok now. Review of Systems  Unable to perform ROS: mental acuity    Objective: Vital Signs: Blood pressure 160/76, pulse 58, temperature 99 F (37.2 C), temperature source Oral, resp. rate 18, weight 100.9 kg (222 lb 7.1 oz), SpO2 95.00%. No results found. Results for orders placed during the hospital encounter of 12/17/12 (from the past 72 hour(s))  GLUCOSE, CAPILLARY     Status: Abnormal   Collection Time    12/25/12  9:25 PM      Result Value Range   Glucose-Capillary 112 (*) 70 - 99 mg/dL      Prior drain sites closed/staples. No drainage.Eyes: EOM are normal.  Neck: Neck supple. No JVD present. No tracheal deviation present. No thyromegaly present.  Cardiovascular: Normal rate and regular rhythm. No murmurs rubs or galloops  Pulmonary/Chest: Breath sounds normal. No respiratory distress. No wheezes or rales or rhonchi  Abdominal: Soft. Bowel sounds are normal. He exhibits no distension. There is no tenderness.  Musculoskeletal: He exhibits no edema.  Lymphadenopathy:  He has no cervical adenopathy.  Neurological: He is lethargic  Patient was able to provide his name . Patient with limited awareness of his deficits. He did follow simple commands.  He needed cues to maintain arousal. Moved all 4's with 4 to 4+/5 strength with. No gross sensory deficits. A little impulsive. No gross limb ataxia. Senses pain and LT in all 4 limbs.    Assessment/Plan: 1. Functional deficits secondary to [facility] due to ruptured ACOM aneurysm which require 3+ hours per day of interdisciplinary therapy in a comprehensive inpatient rehab setting. Physiatrist is providing close team supervision and 24 hour management of active medical problems listed below. Physiatrist and rehab team continue to assess barriers to discharge/monitor patient progress toward functional and medical  goals.  FIM: FIM - Bathing Bathing Steps Patient Completed: Chest;Right Arm;Left Arm;Abdomen;Front perineal area;Buttocks;Right upper leg;Left upper leg;Right lower leg (including foot);Left lower leg (including foot) Bathing: 4: Steadying assist  FIM - Upper Body Dressing/Undressing Upper body dressing/undressing steps patient completed: Thread/unthread right sleeve of pullover shirt/dresss;Thread/unthread left sleeve of pullover shirt/dress;Put head through opening of pull over shirt/dress;Pull shirt over trunk Upper body dressing/undressing: 5: Set-up assist to: Obtain clothing/put away FIM - Lower Body Dressing/Undressing Lower body dressing/undressing steps patient completed: Pull underwear up/down;Thread/unthread right pants leg;Thread/unthread left pants leg;Pull pants up/down;Don/Doff left sock;Don/Doff right sock Lower body dressing/undressing: 4: Min-Patient completed 75 plus % of tasks  FIM - Toileting Toileting steps completed by patient: Adjust clothing prior to toileting;Performs perineal hygiene;Adjust clothing after toileting Toileting: 4: Steadying assist  FIM - Diplomatic Services operational officer Devices: Walker;Grab bars Toilet Transfers: 4-From toilet/BSC: Min A (steadying Pt. > 75%);4-To toilet/BSC: Min A (steadying Pt. > 75%)  FIM - Bed/Chair Transfer Bed/Chair Transfer Assistive Devices: Therapist, occupational: 4: Bed > Chair or W/C: Min A (steadying Pt. > 75%);4: Chair or W/C > Bed: Min A (steadying Pt. > 75%)  FIM - Locomotion: Wheelchair Locomotion: Wheelchair: 2: Travels 50 - 149 ft with supervision, cueing or coaxing FIM - Locomotion: Ambulation Locomotion: Ambulation Assistive Devices: Designer, industrial/product Ambulation/Gait Assistance: 4: Min assist Locomotion: Ambulation: 4: Travels 150 ft or more with minimal assistance (Pt.>75%)  Comprehension Comprehension Mode: Auditory Comprehension: 4-Understands basic 75 - 89% of the time/requires cueing 10  - 24% of the time  Expression Expression Mode: Verbal Expression: 4-Expresses basic 75 -  89% of the time/requires cueing 10 - 24% of the time. Needs helper to occlude trach/needs to repeat words.  Social Interaction Social Interaction: 3-Interacts appropriately 50 - 74% of the time - May be physically or verbally inappropriate.  Problem Solving Problem Solving: 2-Solves basic 25 - 49% of the time - needs direction more than half the time to initiate, plan or complete simple activities  Memory Memory: 2-Recognizes or recalls 25 - 49% of the time/requires cueing 51 - 75% of the time   Medical Problem List and Plan:  1. Right SAH due to ACOM aneurysm. Status post coiling and IVC drain  2. DVT Prophylaxis/Anticoagulation: SCDs. Monitor for any signs of DVT  3. Pain Management: Oxycodone as needed. Monitor with increased mobility  4. Mood/delirium. Bed alarm for safety. Followup speech therapy for cognition  -showing improvement but has poor judgement and insight.  -fall safety planning 5. Neuropsych: This patient is not capable of making decisions on his/her own behalf.  6. Hyponatremia. Resolved. Off sodium and demeclocycline  7. Hypertension. Discuss plan to taper nimodipine with IR 8.  Leukocytosis without fever or symptoms, UA clean--- check culture  - afebrile  LOS (Days) 9 A FACE TO FACE EVALUATION WAS PERFORMED  SWARTZ,ZACHARY T 12/26/2012, 8:26 AM   Patient ID: Dennis Wyatt, male   DOB: 1961/05/08, 52 y.o.   MRN: [id number] Subjective/Complaints: 52 y.o. right-handed male with history of hypertension. Admitted 12/03/2012 with headache and dizziness. Blood pressure noted to be 204/123. Cranial CT scan showed subarachnoid hemorrhage with concern for ruptured aneurysm. Four-vessel cerebral arteriogram showed 11 mm x 7 mm irregular right ACA A1 aneurysm. Interventional radiology consulted and patient underwent coiling of aneurysm, IVC drain. Patient did remain intubated for a time  followed by critical care medicine. Latest followup cranial CT scan 12/13/2012 stable and external ventricular drain has been removed. Close monitoring of blood pressure as patient remained on nimodipine x21 day protocol and await plan to taper . He is tolerating a regular consistency diet. Patient with bouts of hyponatremia 115 and placed on Declomycin as well as sodium chloride tablets with latest followup sodium level 135 on 12/16/2012  Patient is sleepy this morning, arouses to verbal. States name, incorrectly states place, unable to state day or date Review of Systems  Unable to perform ROS: mental acuity    Objective: Vital Signs: Blood pressure 160/76, pulse 58, temperature 99 F (37.2 C), temperature source Oral, resp. rate 18, weight 100.9 kg (222 lb 7.1 oz), SpO2 95.00%. No results found. Results for orders placed during the hospital encounter of 12/17/12 (from the past 72 hour(s))  GLUCOSE, CAPILLARY     Status: Abnormal   Collection Time    12/25/12  9:25 PM      Result Value Range   Glucose-Capillary 112 (*) 70 - 99 mg/dL      Prior drain sites closed/staples. No drainage.Eyes: EOM are normal.  Neck: Neck supple. No JVD present. No tracheal deviation present. No thyromegaly present.  Cardiovascular: Normal rate and regular rhythm. No murmurs rubs or galloops  Pulmonary/Chest: Breath sounds normal. No respiratory distress. No wheezes or rales or rhonchi  Abdominal: Soft. Bowel sounds are normal. He exhibits no distension. There is no tenderness.  Musculoskeletal: He exhibits no edema.  Lymphadenopathy:  He has no cervical adenopathy.  Neurological: He is lethargic  Patient was able to provide his name . Patient with limited awareness of his deficits. He did follow simple commands.  He needed cues to maintain arousal.  Moved all 4's with 4 to 4+/5 strength with. No gross sensory deficits. A little impulsive. No gross limb ataxia. Senses pain and LT in all 4 limbs.     Assessment/Plan: 1. Functional deficits secondary to [facility] due to ruptured ACOM aneurysm which require 3+ hours per day of interdisciplinary therapy in a comprehensive inpatient rehab setting. Physiatrist is providing close team supervision and 24 hour management of active medical problems listed below. Physiatrist and rehab team continue to assess barriers to discharge/monitor patient progress toward functional and medical goals. FIM: FIM - Bathing Bathing Steps Patient Completed: Chest;Right Arm;Left Arm;Abdomen;Front perineal area;Buttocks;Right upper leg;Left upper leg;Right lower leg (including foot);Left lower leg (including foot) Bathing: 4: Steadying assist  FIM - Upper Body Dressing/Undressing Upper body dressing/undressing steps patient completed: Thread/unthread right sleeve of pullover shirt/dresss;Thread/unthread left sleeve of pullover shirt/dress;Put head through opening of pull over shirt/dress;Pull shirt over trunk Upper body dressing/undressing: 5: Set-up assist to: Obtain clothing/put away FIM - Lower Body Dressing/Undressing Lower body dressing/undressing steps patient completed: Pull underwear up/down;Thread/unthread right pants leg;Thread/unthread left pants leg;Pull pants up/down;Don/Doff left sock;Don/Doff right sock Lower body dressing/undressing: 4: Min-Patient completed 75 plus % of tasks  FIM - Toileting Toileting steps completed by patient: Adjust clothing prior to toileting;Performs perineal hygiene;Adjust clothing after toileting Toileting: 4: Steadying assist  FIM - Diplomatic Services operational officer Devices: Walker;Grab bars Toilet Transfers: 4-From toilet/BSC: Min A (steadying Pt. > 75%);4-To toilet/BSC: Min A (steadying Pt. > 75%)  FIM - Bed/Chair Transfer Bed/Chair Transfer Assistive Devices: Therapist, occupational: 4: Bed > Chair or W/C: Min A (steadying Pt. > 75%);4: Chair or W/C > Bed: Min A (steadying Pt. > 75%)  FIM - Locomotion:  Wheelchair Locomotion: Wheelchair: 2: Travels 50 - 149 ft with supervision, cueing or coaxing FIM - Locomotion: Ambulation Locomotion: Ambulation Assistive Devices: Designer, industrial/product Ambulation/Gait Assistance: 4: Min assist Locomotion: Ambulation: 4: Travels 150 ft or more with minimal assistance (Pt.>75%)  Comprehension Comprehension Mode: Auditory Comprehension: 4-Understands basic 75 - 89% of the time/requires cueing 10 - 24% of the time  Expression Expression Mode: Verbal Expression: 4-Expresses basic 75 - 89% of the time/requires cueing 10 - 24% of the time. Needs helper to occlude trach/needs to repeat words.  Social Interaction Social Interaction: 3-Interacts appropriately 50 - 74% of the time - May be physically or verbally inappropriate.  Problem Solving Problem Solving: 2-Solves basic 25 - 49% of the time - needs direction more than half the time to initiate, plan or complete simple activities  Memory Memory: 2-Recognizes or recalls 25 - 49% of the time/requires cueing 51 - 75% of the time Medical Problem List and Plan:  1. Right SAH due to ACOM aneurysm. Status post coiling and IVC drain  2. DVT Prophylaxis/Anticoagulation: SCDs.   -popliteal, peroneal dvt's---xarelto initiated.  -recheck dopplers in a month 3. Pain Management: Oxycodone as needed. Monitor with increased mobility   -topamax 25mg  qhs initiated with some benefit---will increase to 50mg  4. Mood/delirium. Bed alarm for safety. Followup speech therapy for cognition  -showing improvement but has poor judgement and insight.  -is a fall risk----fall safety planning  -ritalin for attention/focus 5. Neuropsych: This patient is not capable of making decisions on his/her own behalf.  6. Hyponatremia. Resolved.  off Declomycin and  sodium chloride tablets. Followup chemistries  7. Hypertension. Discuss plan to taper nimodipine with IR 8.  Leukocytosis without fever or symptoms, ua negative, culture neg, monitor CBC  for trend  - +DVT LOS (Days) 9  A FACE TO FACE EVALUATION WAS PERFORMED  SWARTZ,ZACHARY T 12/26/2012, 8:26 AM

## 2012-12-27 ENCOUNTER — Inpatient Hospital Stay (HOSPITAL_COMMUNITY): Payer: No Typology Code available for payment source | Admitting: Physical Therapy

## 2012-12-27 ENCOUNTER — Encounter (HOSPITAL_COMMUNITY): Payer: No Typology Code available for payment source

## 2012-12-27 ENCOUNTER — Inpatient Hospital Stay (HOSPITAL_COMMUNITY): Payer: No Typology Code available for payment source

## 2012-12-27 DIAGNOSIS — I10 Essential (primary) hypertension: Secondary | ICD-10-CM

## 2012-12-27 DIAGNOSIS — I609 Nontraumatic subarachnoid hemorrhage, unspecified: Secondary | ICD-10-CM

## 2012-12-27 MED ORDER — POLYVINYL ALCOHOL 1.4 % OP SOLN
2.0000 [drp] | OPHTHALMIC | Status: DC | PRN
  Filled 2012-12-27: qty 15

## 2012-12-27 NOTE — Progress Notes (Signed)
Occupational Therapy Session Note  Patient Details  Name: Dennis Wyatt MRN: [id number] Date of Birth: [birth year]  Today's Date: 12/27/2012  Session 1 Time: 0700-0758 Time Calculation (min): 58 min  Short Term Goals: Week 1:  OT Short Term Goal 1 (Week 1): Patient will identify need to void, and wait for assistance to arrive OT Short Term Goal 2 (Week 1): Patient will transfer to toilet with close supervision OT Short Term Goal 3 (Week 1): Patient will dress lower body with min assist OT Short Term Goal 4 (Week 1): Patient will complete steps of grooming tasks with only initial verbal directive, or environmental cue  Skilled Therapeutic Interventions/Progress Updates:    Pt in bed resting upon arrival.  Pt greeted therapist but thought he was at motel this morning.  When reoriented to hospital patient stated reason for being in hospital but was insistent that he had been in motel yesterday.  Pt exhibited continuous periods of confusion throughout therapy session and required reorienting to place and situation.  Pt engaged in bathing at shower level but required max verbal cues for problem solving with turning water on and off and transfer to toilet.  Pt completed dressing with sit<>stand from w/c.  Pt required max verbal cues for sequencing and redirection to task throughout session.  Pt rolled to nurses station to eat breakfast at end of session.  Therapy Documentation Precautions:  Precautions Precautions: Fall Precaution Comments: confused, disoriented, light headed Restrictions Weight Bearing Restrictions: No Pain: Pain Assessment Pain Assessment: No/denies pain  See FIM for current functional status  Therapy/Group: Individual Therapy  Session 2 Pt c/o 8/10 headache; RN aware Individual Therapy  Pt seated in w/c with niece and friends at side.  Cotx with Recreational Therapist.  Pt engaged in dynamic standing activities (Bocce ball) with increased emphasis on safety  awareness.  Pt required max verbal cues for safety with sit<>stand.  Pt c/o increased fatigue as activity progressed and patient required increased verbal cues for safety.  [name], [name] 12/27/2012, 8:01 AM

## 2012-12-27 NOTE — Progress Notes (Signed)
Patient ID: Dennis Wyatt, male   DOB: 1961-04-06, 52 y.o.   MRN: [id number] Subjective/Complaints: Sleeping better. topamax has helped with headaches. Review of Systems      Objective: Vital Signs: Blood pressure 139/77, pulse 62, temperature 97.1 F (36.2 C), temperature source Oral, resp. rate 17, weight 100.9 kg (222 lb 7.1 oz), SpO2 98.00%. No results found. Results for orders placed during the hospital encounter of 12/17/12 (from the past 72 hour(s))  GLUCOSE, CAPILLARY     Status: Abnormal   Collection Time    12/25/12  9:25 PM      Result Value Range   Glucose-Capillary 112 (*) 70 - 99 mg/dL      Prior drain sites closed/staples. No drainage.Eyes: EOM are normal.  Neck: Neck supple. No JVD present. No tracheal deviation present. No thyromegaly present.  Cardiovascular: Normal rate and regular rhythm. No murmurs rubs or galloops  Pulmonary/Chest: Breath sounds normal. No respiratory distress. No wheezes or rales or rhonchi  Abdominal: Soft. Bowel sounds are normal. He exhibits no distension. There is no tenderness.  Musculoskeletal: He exhibits no edema.  Lymphadenopathy:  He has no cervical adenopathy.  Neurological: He is lethargic  Patient was able to provide his name . New he was on the 4th floor. Patient with limited awareness of his deficits. He did follow simple commands.  He needed cues to maintain arousal. Moved all 4's with 4 to 4+/5 strength with. No gross sensory deficits. A little impulsive. No gross limb ataxia. Senses pain and LT in all 4 limbs.    Assessment/Plan: 1. Functional deficits secondary to [facility] due to ruptured ACOM aneurysm which require 3+ hours per day of interdisciplinary therapy in a comprehensive inpatient rehab setting. Physiatrist is providing close team supervision and 24 hour management of active medical problems listed below. Physiatrist and rehab team continue to assess barriers to discharge/monitor patient progress toward functional and  medical goals.  FIM: FIM - Bathing Bathing Steps Patient Completed: Chest;Right Arm;Left Arm;Abdomen;Front perineal area;Buttocks;Left lower leg (including foot);Right lower leg (including foot);Left upper leg;Right upper leg Bathing: 4: Steadying assist  FIM - Upper Body Dressing/Undressing Upper body dressing/undressing steps patient completed: Thread/unthread right sleeve of pullover shirt/dresss;Thread/unthread left sleeve of pullover shirt/dress;Put head through opening of pull over shirt/dress;Pull shirt over trunk Upper body dressing/undressing: 5: Set-up assist to: Obtain clothing/put away FIM - Lower Body Dressing/Undressing Lower body dressing/undressing steps patient completed: Pull underwear up/down;Thread/unthread right pants leg;Thread/unthread left pants leg;Pull pants up/down;Don/Doff left sock;Don/Doff right sock Lower body dressing/undressing: 4: Steadying Assist  FIM - Toileting Toileting steps completed by patient: Performs perineal hygiene Toileting: 4: Steadying assist  FIM - Diplomatic Services operational officer Devices: Walker;Grab bars Toilet Transfers: 4-From toilet/BSC: Min A (steadying Pt. > 75%);4-To toilet/BSC: Min A (steadying Pt. > 75%)  FIM - Bed/Chair Transfer Bed/Chair Transfer Assistive Devices: Therapist, occupational: 4: Bed > Chair or W/C: Min A (steadying Pt. > 75%);4: Chair or W/C > Bed: Min A (steadying Pt. > 75%)  FIM - Locomotion: Wheelchair Locomotion: Wheelchair: 2: Travels 50 - 149 ft with supervision, cueing or coaxing FIM - Locomotion: Ambulation Locomotion: Ambulation Assistive Devices: Designer, industrial/product Ambulation/Gait Assistance: 4: Min assist Locomotion: Ambulation: 4: Travels 150 ft or more with minimal assistance (Pt.>75%)  Comprehension Comprehension Mode: Auditory Comprehension: 4-Understands basic 75 - 89% of the time/requires cueing 10 - 24% of the time  Expression Expression Mode: Verbal Expression: 4-Expresses  basic 75 - 89% of the time/requires cueing 10 - 24% of  the time. Needs helper to occlude trach/needs to repeat words.  Social Interaction Social Interaction: 3-Interacts appropriately 50 - 74% of the time - May be physically or verbally inappropriate.  Problem Solving Problem Solving: 2-Solves basic 25 - 49% of the time - needs direction more than half the time to initiate, plan or complete simple activities  Memory Memory: 2-Recognizes or recalls 25 - 49% of the time/requires cueing 51 - 75% of the time   Medical Problem List and Plan:  1. Right SAH due to ACOM aneurysm. Status post coiling and IVC drain  2. DVT Prophylaxis/Anticoagulation: SCDs. Monitor for any signs of DVT  3. Pain Management: Oxycodone as needed. Monitor with increased mobility  4. Mood/delirium. Bed alarm for safety. Followup speech therapy for cognition  -showing improvement but has poor judgement and insight.  -fall safety planning 5. Neuropsych: This patient is not capable of making decisions on his/her own behalf.  6. Hyponatremia. Resolved. Off sodium and demeclocycline  7. Hypertension. Discuss plan to taper nimodipine with IR 8.  Leukocytosis without fever or symptoms, UA clean--- culture negative  - afebrile  LOS (Days) 10 A FACE TO FACE EVALUATION WAS PERFORMED  Dennis Wyatt T 12/27/2012, 8:06 AM   Patient ID: Dennis Wyatt, male   DOB: 1960/09/15, 52 y.o.   MRN: [id number] Subjective/Complaints: 52 y.o. right-handed male with history of hypertension. Admitted 12/03/2012 with headache and dizziness. Blood pressure noted to be 204/123. Cranial CT scan showed subarachnoid hemorrhage with concern for ruptured aneurysm. Four-vessel cerebral arteriogram showed 11 mm x 7 mm irregular right ACA A1 aneurysm. Interventional radiology consulted and patient underwent coiling of aneurysm, IVC drain. Patient did remain intubated for a time followed by critical care medicine. Latest followup cranial CT scan 12/13/2012  stable and external ventricular drain has been removed. Close monitoring of blood pressure as patient remained on nimodipine x21 day protocol and await plan to taper . He is tolerating a regular consistency diet. Patient with bouts of hyponatremia 115 and placed on Declomycin as well as sodium chloride tablets with latest followup sodium level 135 on 12/16/2012  Patient is sleepy this morning, arouses to verbal. States name, incorrectly states place, unable to state day or date Review of Systems  Unable to perform ROS: mental acuity    Objective: Vital Signs: Blood pressure 139/77, pulse 62, temperature 97.1 F (36.2 C), temperature source Oral, resp. rate 17, weight 100.9 kg (222 lb 7.1 oz), SpO2 98.00%. No results found. Results for orders placed during the hospital encounter of 12/17/12 (from the past 72 hour(s))  GLUCOSE, CAPILLARY     Status: Abnormal   Collection Time    12/25/12  9:25 PM      Result Value Range   Glucose-Capillary 112 (*) 70 - 99 mg/dL      Prior drain sites closed/staples. No drainage.Eyes: EOM are normal.  Neck: Neck supple. No JVD present. No tracheal deviation present. No thyromegaly present.  Cardiovascular: Normal rate and regular rhythm. No murmurs rubs or galloops  Pulmonary/Chest: Breath sounds normal. No respiratory distress. No wheezes or rales or rhonchi  Abdominal: Soft. Bowel sounds are normal. He exhibits no distension. There is no tenderness.  Musculoskeletal: He exhibits no edema.  Lymphadenopathy:  He has no cervical adenopathy.  Neurological: He is lethargic  Patient was able to provide his name . Patient with limited awareness of his deficits. He did follow simple commands.  He needed cues to maintain arousal. Moved all 4's with 4 to 4+/5 strength with.  No gross sensory deficits. A little impulsive. No gross limb ataxia. Senses pain and LT in all 4 limbs.    Assessment/Plan: 1. Functional deficits secondary to [facility] due to ruptured ACOM  aneurysm which require 3+ hours per day of interdisciplinary therapy in a comprehensive inpatient rehab setting. Physiatrist is providing close team supervision and 24 hour management of active medical problems listed below. Physiatrist and rehab team continue to assess barriers to discharge/monitor patient progress toward functional and medical goals. FIM: FIM - Bathing Bathing Steps Patient Completed: Chest;Right Arm;Left Arm;Abdomen;Front perineal area;Buttocks;Left lower leg (including foot);Right lower leg (including foot);Left upper leg;Right upper leg Bathing: 4: Steadying assist  FIM - Upper Body Dressing/Undressing Upper body dressing/undressing steps patient completed: Thread/unthread right sleeve of pullover shirt/dresss;Thread/unthread left sleeve of pullover shirt/dress;Put head through opening of pull over shirt/dress;Pull shirt over trunk Upper body dressing/undressing: 5: Set-up assist to: Obtain clothing/put away FIM - Lower Body Dressing/Undressing Lower body dressing/undressing steps patient completed: Pull underwear up/down;Thread/unthread right pants leg;Thread/unthread left pants leg;Pull pants up/down;Don/Doff left sock;Don/Doff right sock Lower body dressing/undressing: 4: Steadying Assist  FIM - Toileting Toileting steps completed by patient: Performs perineal hygiene Toileting: 4: Steadying assist  FIM - Diplomatic Services operational officer Devices: Walker;Grab bars Toilet Transfers: 4-From toilet/BSC: Min A (steadying Pt. > 75%);4-To toilet/BSC: Min A (steadying Pt. > 75%)  FIM - Bed/Chair Transfer Bed/Chair Transfer Assistive Devices: Therapist, occupational: 4: Bed > Chair or W/C: Min A (steadying Pt. > 75%);4: Chair or W/C > Bed: Min A (steadying Pt. > 75%)  FIM - Locomotion: Wheelchair Locomotion: Wheelchair: 2: Travels 50 - 149 ft with supervision, cueing or coaxing FIM - Locomotion: Ambulation Locomotion: Ambulation Assistive Devices: Dealer Ambulation/Gait Assistance: 4: Min assist Locomotion: Ambulation: 4: Travels 150 ft or more with minimal assistance (Pt.>75%)  Comprehension Comprehension Mode: Auditory Comprehension: 4-Understands basic 75 - 89% of the time/requires cueing 10 - 24% of the time  Expression Expression Mode: Verbal Expression: 4-Expresses basic 75 - 89% of the time/requires cueing 10 - 24% of the time. Needs helper to occlude trach/needs to repeat words.  Social Interaction Social Interaction: 3-Interacts appropriately 50 - 74% of the time - May be physically or verbally inappropriate.  Problem Solving Problem Solving: 2-Solves basic 25 - 49% of the time - needs direction more than half the time to initiate, plan or complete simple activities  Memory Memory: 2-Recognizes or recalls 25 - 49% of the time/requires cueing 51 - 75% of the time Medical Problem List and Plan:  1. Right SAH due to ACOM aneurysm. Status post coiling and IVC drain  2. DVT Prophylaxis/Anticoagulation: SCDs.   -popliteal, peroneal dvt's---xarelto initiated.  -recheck dopplers in a month 3. Pain Management: Oxycodone as needed. Monitor with increased mobility   -topamax 25mg  qhs initiated with some benefit---will increase to 50mg  4. Mood/delirium. Bed alarm for safety. Followup speech therapy for cognition  -showing improvement but has poor judgement and insight.  -is a fall risk----fall safety planning  -ritalin for attention/focus 5. Neuropsych: This patient is not capable of making decisions on his/her own behalf.  6. Hyponatremia. Resolved.  off Declomycin and  sodium chloride tablets. Followup chemistries  7. Hypertension. Discuss plan to taper nimodipine with IR 8.  Leukocytosis without fever or symptoms, ua negative, culture neg, monitor CBC for trend  - +DVT LOS (Days) 10 A FACE TO FACE EVALUATION WAS PERFORMED  Dennis Wyatt T 12/27/2012, 8:06 AM

## 2012-12-27 NOTE — Progress Notes (Signed)
Recreational Therapy Session Note  Patient Details  Name: Dennis Wyatt MRN: [id number] Date of Birth: 1961-06-05 Today's Date: 12/27/2012 Time:  4098-1191 Pain: 8/10 HA, RN aware Skilled Therapeutic Interventions/Progress Updates:  Session focused on activity tolerance, dynamic standing balance & safety.  Pt stood using arm rest of chair for UE support while playing Bocce ball with contact guard assist.  Pt required max cues for safety during sit-stands.    Therapy/Group: Co-Treatment  Shakerra Red 12/27/2012, 2:55 PM

## 2012-12-27 NOTE — Progress Notes (Signed)
Physical Therapy Weekly Progress Note  Patient Details  Name: Dennis Wyatt MRN: [id number] Date of Birth: 05/03/1961  Today's Date: 12/27/2012 Time: 4098-1191 Time Calculation (min): 55 min  Patient has met 3 of 3 short term goals.  Pt is currently functioning at a min assist/supervision level with ambulation while utilizing RW. Pt continues to struggle with deficits listed below but decreased safety awareness and decreased awareness of deficits are most limiting to pt. Pt's niece has been present for several therapy sessions and has started practicing with providing assist/supervision with mobility.   Patient continues to demonstrate the following deficits: decreased dynamic standing balance, decreased ambulation distance and safety, decreased ability to safely use RW, safety awareness, awareness of deficits, attention, long term and short term memory, intellectual and emergent awareness, delayed processing and decreased problem solving and therefore will continue to benefit from skilled PT intervention to enhance overall performance with activity tolerance, balance, postural control, ability to compensate for deficits, attention, awareness and knowledge of precautions.  Patient progressing toward long term goals..  Continue plan of care.  PT Short Term Goals Week 1:  PT Short Term Goal 1 (Week 1): Pt will ambulate x 100' with moderate assist of one person PT Short Term Goal 1 - Progress (Week 1): Met PT Short Term Goal 2 (Week 1): Pt will tolerate 3 min continuous standing activity with min assist PT Short Term Goal 2 - Progress (Week 1): Met PT Short Term Goal 3 (Week 1): Pt will demonstrate sustained attention for >/= 5 min continuous.  PT Short Term Goal 3 - Progress (Week 1): Met  Skilled Therapeutic Interventions/Progress Updates:    Ambulation x 150', 150', 200' with RW and niece providing min-guard assist/supervision after PT demonstration and education. Obstacle course negotiating  cones, stepping over obstacles and ascending/descending 5 steps with Rt. Rail and Lt. HHA, Niece providing min assist, PT providing mod verbal cues for RW negotiation and safety. Practiced ascending/descending 5 steps again with niece proving only HHA, pt not using rails to simulate home entry. Pt much less steady, niece reports rails can be added easily. PT recommends railings at home for safety. Car transfer with RW, pt and niece educated on safety. [name] toss with cognitive challenge in standing without UE support for dynamic balance, min/mod assist.    Throughout session addressed sustained attention, path finding, short term and long term memory, problem solving, safety awareness.  Therapy Documentation Precautions:  Precautions Precautions: Fall Precaution Comments: confused, disoriented, light headed Restrictions Weight Bearing Restrictions: No Pain: "It's not bad" with reference to head pain  See FIM for current functional status  Therapy/Group: Individual Therapy  Wilhemina Bonito 12/27/2012, 12:09 PM

## 2012-12-27 NOTE — Progress Notes (Signed)
Patient found on rounds standing at bedside with walker.  When asked why bed alarm wasn't alarming, patient stated he turned it off.  Ambulated to bathroom with min assist and cueing for safety.  Voided into toilet.  Assisted back to bed and bed alarm turned on.  Instructed patient to leave alarm alone and reminded him to use call bell for assistance.  Patient verbalized understanding.    Kelli Hope M

## 2012-12-27 NOTE — Progress Notes (Signed)
Speech Language Pathology Weekly & Daily Progress Note  Patient Details  Name: Dennis Wyatt MRN: [id number] Date of Birth: Jul 11, 1961  Today's Date: 12/27/2012 Time: 0930 -1015  Time Calculation (min): 45 min  Short Term Goals: Week 1: SLP Short Term Goal 1 (Week 1): Patient will sustain attention to familiar task for 10 minutes with Mod assist vebral cues for re-direction. SLP Short Term Goal 1 - Progress (Week 1): Met SLP Short Term Goal 2 (Week 1): Patient will utilize external aids to assist with orientation with Mod assist question cues. SLP Short Term Goal 2 - Progress (Week 1): Met SLP Short Term Goal 3 (Week 1): Patient will verbalize 3 deficits that impact his ability to safely complete basic ADLs with Mod question cues. SLP Short Term Goal 3 - Progress (Week 1): Not met SLP Short Term Goal 4 (Week 1): Patient will demonstrate basic problem solving with Mod assist clinician cues. SLP Short Term Goal 4 - Progress (Week 1): Met SLP Short Term Goal 5 (Week 1): Patient will utilize word finding strategies during structured tasks with Mod assist verbal cues. SLP Short Term Goal 5 - Progress (Week 1): Not met  Weekly Progress Updates: This week patient has met 3/5 goals due to an increase in sustained attention, ability to use external aids for orientation, and basic problem solving. Throughout this week pt has been observed to demonstrate selective attention, although he continues to be verbose and tangential in his speech, requiring redirection. Pt would benefit from continued skilled SLP intervention to maximize functional communication, and cognitive function prior to discharge.    SLP Intensity: Minumum of 1-2 x/day, 30 to 90 minutes SLP Frequency: 5 out of 7 days SLP Duration/Estimated Length of Stay: 2 weeks SLP Treatment/Interventions: Cognitive remediation/compensation;Cueing hierarchy;Environmental controls;Functional tasks;Internal/external aids;Patient/family  education;Speech/Language facilitation;Therapeutic Activities  Daily Session Skilled treatment focused on cognitive goals. SLP facilitated session with sequencing task. Patient was able to sequence four series of six pictures with moderate question cues for sustained attention, accuracy, and problem solving.  Pain: N/A  Therapy/Group: Individual Therapy  Maxcine Ham 12/27/2012, 3:30 PM   Maxcine Ham, M.A. CCC-SLP

## 2012-12-27 NOTE — Plan of Care (Signed)
Problem: RH SAFETY Goal: RH STG ADHERE TO SAFETY PRECAUTIONS W/ASSISTANCE/DEVICE STG Adhere to Safety Precautions With supervision  Outcome: Not Progressing Impulsive at times  Problem: RH PAIN MANAGEMENT Goal: RH STG PAIN MANAGED AT OR BELOW PT'S PAIN GOAL <6  Outcome: Progressing Denies pain at time of assessment

## 2012-12-28 ENCOUNTER — Inpatient Hospital Stay (HOSPITAL_COMMUNITY): Payer: No Typology Code available for payment source | Admitting: Occupational Therapy

## 2012-12-28 ENCOUNTER — Inpatient Hospital Stay (HOSPITAL_COMMUNITY): Payer: No Typology Code available for payment source | Admitting: *Deleted

## 2012-12-28 ENCOUNTER — Inpatient Hospital Stay (HOSPITAL_COMMUNITY): Payer: No Typology Code available for payment source

## 2012-12-28 ENCOUNTER — Inpatient Hospital Stay (HOSPITAL_COMMUNITY): Payer: No Typology Code available for payment source | Admitting: Speech Pathology

## 2012-12-28 DIAGNOSIS — I10 Essential (primary) hypertension: Secondary | ICD-10-CM

## 2012-12-28 DIAGNOSIS — I609 Nontraumatic subarachnoid hemorrhage, unspecified: Secondary | ICD-10-CM

## 2012-12-28 MED ORDER — NIMODIPINE 30 MG PO CAPS
60.0000 mg | ORAL_CAPSULE | Freq: Four times a day (QID) | ORAL | Status: DC
  Administered 2012-12-28 – 2012-12-30 (×8): 60 mg via ORAL
  Filled 2012-12-28 (×12): qty 2

## 2012-12-28 NOTE — Progress Notes (Signed)
Occupational Therapy Session Note  Patient Details  Name: Dennis Wyatt MRN: [id number] Date of Birth: [birth year]  Today's Date: 12/28/2012 Time: 0700-0800 Time Calculation (min): 60 min  Short Term Goals: Week 2:  OT Short Term Goal 1 (Week 2): pt will identify need to void and wait for assistance to arrive OT Short Term Goal 2 (Week 2): patient will perform toilet transfer with supervision OT Short Term Goal 3 (Week 2): pt will perform toileting tasks with supervison  Skilled Therapeutic Interventions/Progress Updates:    Pt asleep in bed upon arrival.  Pt reuired max verbal cues and tactile cues to awake.  Pt required min verbal cues for orientation to place and situation.  Pt would acknowledge he is at hospital for bleeding on his brain but would revert to other situations throughout session and require verbal cues to reorient.  Pt required min A with shower transfer and steady A when standing in shower this morning.  Pt exhibited decreased safety awareness and required verbal and tactile cues when navigating RW in cluttered environment.  Pt exhibited decreased problem solving using RW this morning.  Pt talked about going to [place] later in the day and when reminded that he was in the hospital he stated that he could still get down there this afternoon cause he heard that the weather was going to be good.  Focus on safety awareness, orientation to place and situation, safety awareness, functional amb with RW, and problem solving in new situations.  Therapy Documentation Precautions:  Precautions Precautions: Fall Precaution Comments: confused, disoriented, light headed Restrictions Weight Bearing Restrictions: No Pain: Pain Assessment Pain Assessment: No/denies pain Pain Score: Asleep  See FIM for current functional status  Therapy/Group: Individual Therapy  Hurley, Sobel 12/28/2012, 8:06 AM

## 2012-12-28 NOTE — Progress Notes (Signed)
Occupational Therapy Weekly Progress Note  Patient Details  Name: Dennis Wyatt MRN: [id number] Date of Birth: 1961-06-08  Today's Date: 12/28/2012  Patient has met 2 of 4 short term goals.  Pt has progressed slowly and inconsistently this admission.  Pt is supervision/min A for BADLs with min to mod verbal cues for task initiation and redirection to task.  Pt continues to require verbal cues for orientation to place every morning and often several times throughout day.  Pt requires close supervision when ambulating with RW to bathroom or in room to gather items.  Pt requires max verbal cues for safety awareness and for awareness of deficits.  Pt's niece has been present for therapy session but has not practiced ambulating with patient.  Pt is incontinent of bladder. Patient continues to demonstrate the following deficits: muscle weakness, decreased cardiorespiratoy endurance, impaired timing and sequencing, unbalanced muscle activation, decreased coordination and decreased motor planning, decreased motor planning, decreased initiation, decreased attention, decreased awareness, decreased problem solving, decreased safety awareness, decreased memory and delayed processing and decreased standing balance, decreased postural control, decreased balance strategies and difficulty maintaining precautions and therefore will continue to benefit from skilled OT intervention to enhance overall performance with BADL and Reduce care partner burden.  Patient progressing toward long term goals..  Continue plan of care.  OT Short Term Goals Week 1:  OT Short Term Goal 1 (Week 1): Patient will identify need to void, and wait for assistance to arrive OT Short Term Goal 1 - Progress (Week 1): Progressing toward goal OT Short Term Goal 2 (Week 1): Patient will transfer to toilet with close supervision OT Short Term Goal 2 - Progress (Week 1): Progressing toward goal OT Short Term Goal 3 (Week 1): Patient will dress lower  body with min assist OT Short Term Goal 3 - Progress (Week 1): Met OT Short Term Goal 4 (Week 1): Patient will complete steps of grooming tasks with only initial verbal directive, or environmental cue OT Short Term Goal 4 - Progress (Week 1): Met Week 2:  OT Short Term Goal 1 (Week 2): pt will identify need to void and wait for assistance to arrive OT Short Term Goal 2 (Week 2): patient will perform toilet transfer with supervision OT Short Term Goal 3 (Week 2): pt will perform toileting tasks with supervison  Skilled Therapeutic Interventions/Progress Updates:      Therapy Documentation Precautions:  Precautions Precautions: Fall Precaution Comments: confused, disoriented, light headed Restrictions Weight Bearing Restrictions: No Pain: Pain Assessment Pain Score: Asleep  See FIM for current functional status  Therapy/Group: Individual Therapy  [name], [name] 12/28/2012, 6:57 AM   1:1 13:00-13:30 focus on family education with niece on functional ambulation into bathroom, toileting with appropriate cuing, functional ambulation with proper use of RW to get to the sink to wash hands. Niece took a phone call while pt was on toilet and was ready to get up so OT needed to help him safely finish. Functional ambulation with RW with slow speed to gym to perform visual scanning  and processing card game and simple problem solving activity building simple pipe tree design. To perform card scanning activity pt needed no cues to find appropriate card but did require max A to problem solve building structure.

## 2012-12-28 NOTE — Consult Note (Signed)
NEUROCOGNITIVE TESTING - CONFIDENTIAL [facility]   Mr. 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 He admitted to suffering from a significant degree of cognitive disruption immediately following that event which has persisted. He feels forgetful and endorsed diminished attention and concentration. He also described his speed of thinking as slow. In addition, Mr. Gelpi acknowledged having a longstanding history of depression and anxiety which he self-medicated with alcohol. He has never been treated via an antidepressant or talk therapy. He feels fortunate that he has his niece to assist him with daily activities when he is discharged.   The patient was referred for neuropsychological consultation given the possibility of cognitive sequelae subsequent to the current medical status and in order to assist in treatment planning.   PROCEDURES: [3 units of 16109 on 12/27/12]  Diagnostic Interview Medical record review Behavioral observations  The following tests were performed during today's visit: Repeatable Battery for the Assessment of Neuropsychological Status (RBANS, form A), and The Beck Depression Inventory - Fast Screen.  Test results are as follows:    RBANS Indices Scaled Score Percentile Description  Immediate Memory  53 <1 Markedly impaired  Visuospatial/Constructional 72 3 Impaired   Language 79 8 Borderline impaired  Attention 53 <1 Markedly impaired  Delayed Memory 48 <1 Markedly impaired  Total Score 53 <1 Markedly impaired    RBANS Subtests Raw Score Percentile Description  List Learning 13 <1 Markedly impaired  Story Memory 9 1 Markedly impaired  Figure Copy 12 <1 Markedly impaired  Line Orientation 16 45 Average  Picture Naming 9 32 Average  Semantic Fluency 11 2 Markedly impaired  Digit Span 6 3 Impaired  Coding 16 <1 Markedly impaired  List Recall 0 <1 Markedly  impaired  List Recognition 15 <1 Markedly impaired  Story Recall 1 <1 Markedly impaired  Figure recall 0 <1 Markedly impaired    BDI-Fast Screen Raw Score = D/C Description = N/A   Cognitive Evaluation: Test results revealed reduced (if not impaired) functioning in most cognitive domains and thinking skills assessed during this evaluation with the exception of intact simple naming and spatial judgment.   Emotional & Behavioral Evaluation: Mr. Ziegler was appropriately dressed for season and situation. Normal posture was noted. He was friendly and rapport easily established. His speech was as expected and he was able to express ideas effectively. He seemed to understand test directions readily. His affect was somewhat flat and he appeared depressed. Attention and motivation were good. Optimal test taking conditions were maintained.  From an emotional standpoint, a self-report inventory was discontinued at the patient's request. However, it is noteworthy that he admits to suffering from a significant degree of depression and anxiety that has been present for many years. He purportedly self-medicated with alcohol which he admits likely led to his stroke. He and his family are reportedly suspicious about antidepressants and believe that they can make the problem worse. A lot of education was provided on the treatment of emotional factors including non-pharmacological approaches. Suicidal/homicidal ideation, plan or intent was denied. No manic or hypomanic episodes were reported. The patient denied ever experiencing any auditory/visual hallucinations. No major behavioral or personality changes were endorsed.    Overall, Mr. Hildebran seems to be experiencing a significant degree of cognitive impairment and emotional symptoms subsequent to the [facility]. In addition, he has a longstanding history of depression and anxiety that has gone untreated. In light of  these findings, the following recommendations are provided.     RECOMMENDATIONS  Recommendations for treatment team:    When interacting with Mr. Preece, directions and information should be provided in a simple, straight forward manner, and the treatment team should avoid giving multiple instructions simultaneously.    He may also benefit from being provided with multiple trials to learn new skills given the noted memory inefficiencies.    Since emotional factors are likely adversely impacting the patient's daily life, possibly implementing an antidepressant may be beneficial. In addition, ego support and supportive psychotherapy will be beneficial.    To the extent possible, multitasking should be avoided.   He requires more time than typical to process information. The treatment team may benefit from waiting for a verbal response to information before presenting additional information.    Performance will generally be best in a structured, routine, and familiar environment, as opposed to situations involving complex problems.   Recommendations for discharge planning:    Complete a comprehensive neuropsychological evaluation as an outpatient in 8-12 months to assess for interval change. This can be done through Orie Fisherman, PsyD by calling the following number: [phone].    Encourage participation in psychotherapy and/or support groups.    Establish long-term follow-up care with a provider knowledgeable in stroke.    Maintain engagement in mentally, physically and cognitively stimulating activities.    Strive to maintain a healthy lifestyle (e.g., proper diet and exercise) in order to promote physical, cognitive and emotional health.    Due to the nature and severity of the symptoms noted during this evaluation, it is recommended that he initially obtain constant care and supervision following this hospitalization. Fortunately this can be supplied by his niece. Encourage abstinence from alcohol.   The patient should refrain from driving at this  time.    FINAL DIAGNOSES:  SAH due to ruptured ACOM aneurysm  Depressive disorder, NOS (likely Major Depressive Disorder) Anxiety disorder, NOS    Debbe Mounts, Psy.D.  Clinical Neuropsychologist

## 2012-12-28 NOTE — Progress Notes (Signed)
Speech Language Pathology Daily Session Note  Patient Details  Name: Grayton Lobo MRN: [id number] Date of Birth: 1960-11-02  Today's Date: 12/28/2012 Time: 1015-1100 Time Calculation (min): 45 min  Short Term Goals: Week 1: SLP Short Term Goal 1 (Week 1): Patient will sustain attention to familiar task for 10 minutes with Mod assist vebral cues for re-direction. SLP Short Term Goal 1 - Progress (Week 1): Met SLP Short Term Goal 2 (Week 1): Patient will utilize external aids to assist with orientation with Mod assist question cues. SLP Short Term Goal 2 - Progress (Week 1): Met SLP Short Term Goal 3 (Week 1): Patient will verbalize 3 deficits that impact his ability to safely complete basic ADLs with Mod question cues. SLP Short Term Goal 3 - Progress (Week 1): Not met SLP Short Term Goal 4 (Week 1): Patient will demonstrate basic problem solving with Mod assist clinician cues. SLP Short Term Goal 4 - Progress (Week 1): Met SLP Short Term Goal 5 (Week 1): Patient will utilize word finding strategies during structured tasks with Mod assist verbal cues. SLP Short Term Goal 5 - Progress (Week 1): Not met  Skilled Therapeutic Interventions: Skilled treatment focused on speech, language and cognitive goals.  SLP facilitated session with verbal and visual cues for working memory compensation and decreasing verbosity.  SLP also educated pt/niece to word finding compensations and provided written activity for word finding tasks for pt to complete with niece Grover Canavan who was present.  Rusty required moderate verbal cues to decrease verbosity and confabulated during session.       FIM:  Comprehension Comprehension Mode: Auditory Comprehension: 3-Understands basic 50 - 74% of the time/requires cueing 25 - 50%  of the time Expression Expression Mode: Verbal Expression: 3-Expresses basic 50 - 74% of the time/requires cueing 25 - 50% of the time. Needs to repeat parts of sentences. Social  Interaction Social Interaction: 4-Interacts appropriately 75 - 89% of the time - Needs redirection for appropriate language or to initiate interaction. Problem Solving Problem Solving: 2-Solves basic 25 - 49% of the time - needs direction more than half the time to initiate, plan or complete simple activities Memory Memory: 2-Recognizes or recalls 25 - 49% of the time/requires cueing 51 - 75% of the time  Pain Pain Assessment Pain Assessment: No/denies pain (toward end of session, pt reported 9 HA, RN aware)  Therapy/Group: Individual Therapy  Donavan Burnet, MS [facility] SLP [phone]

## 2012-12-28 NOTE — Progress Notes (Signed)
Patient ID: Dennis Wyatt, male   DOB: 16-Jul-1960, 52 y.o.   MRN: [id number] Subjective/Complaints: Slept well. Headaches better Review of Systems      Objective: Vital Signs: Blood pressure 163/80, pulse 66, temperature 98.7 F (37.1 C), temperature source Oral, resp. rate 19, weight 100.9 kg (222 lb 7.1 oz), SpO2 97.00%. No results found. Results for orders placed during the hospital encounter of 12/17/12 (from the past 72 hour(s))  GLUCOSE, CAPILLARY     Status: Abnormal   Collection Time    12/25/12  9:25 PM      Result Value Range   Glucose-Capillary 112 (*) 70 - 99 mg/dL      Prior drain sites closed/staples. No drainage.Eyes: EOM are normal.  Neck: Neck supple. No JVD present. No tracheal deviation present. No thyromegaly present.  Cardiovascular: Normal rate and regular rhythm. No murmurs rubs or galloops  Pulmonary/Chest: Breath sounds normal. No respiratory distress. No wheezes or rales or rhonchi  Abdominal: Soft. Bowel sounds are normal. He exhibits no distension. There is no tenderness.  Musculoskeletal: He exhibits no edema.  Lymphadenopathy:  He has no cervical adenopathy.  Neurological: He is lethargic  Patient was able to provide his name . New he was on the 4th floor. Patient with limited awareness of his deficits. He did follow simple commands.  He needed cues to maintain arousal. Moved all 4's with 4 to 4+/5 strength with. No gross sensory deficits. A little impulsive. No gross limb ataxia. Senses pain and LT in all 4 limbs.    Assessment/Plan: 1. Functional deficits secondary to [facility] due to ruptured ACOM aneurysm which require 3+ hours per day of interdisciplinary therapy in a comprehensive inpatient rehab setting. Physiatrist is providing close team supervision and 24 hour management of active medical problems listed below. Physiatrist and rehab team continue to assess barriers to discharge/monitor patient progress toward functional and medical  goals.  FIM: FIM - Bathing Bathing Steps Patient Completed: Chest;Right Arm;Left Arm;Abdomen;Front perineal area;Buttocks;Left lower leg (including foot);Right lower leg (including foot);Left upper leg;Right upper leg Bathing: 4: Steadying assist  FIM - Upper Body Dressing/Undressing Upper body dressing/undressing steps patient completed: Thread/unthread right sleeve of pullover shirt/dresss;Thread/unthread left sleeve of pullover shirt/dress;Put head through opening of pull over shirt/dress;Pull shirt over trunk Upper body dressing/undressing: 5: Set-up assist to: Obtain clothing/put away FIM - Lower Body Dressing/Undressing Lower body dressing/undressing steps patient completed: Pull underwear up/down;Thread/unthread right pants leg;Thread/unthread left pants leg;Pull pants up/down;Don/Doff left sock;Don/Doff right sock Lower body dressing/undressing: 4: Steadying Assist  FIM - Toileting Toileting steps completed by patient: Performs perineal hygiene Toileting: 4: Steadying assist  FIM - Diplomatic Services operational officer Devices: Walker;Grab bars Toilet Transfers: 4-From toilet/BSC: Min A (steadying Pt. > 75%);4-To toilet/BSC: Min A (steadying Pt. > 75%)  FIM - Bed/Chair Transfer Bed/Chair Transfer Assistive Devices: Therapist, occupational: 4: Bed > Chair or W/C: Min A (steadying Pt. > 75%);4: Chair or W/C > Bed: Min A (steadying Pt. > 75%)  FIM - Locomotion: Wheelchair Locomotion: Wheelchair: 2: Travels 50 - 149 ft with supervision, cueing or coaxing FIM - Locomotion: Ambulation Locomotion: Ambulation Assistive Devices: Designer, industrial/product Ambulation/Gait Assistance: 4: Min assist Locomotion: Ambulation: 4: Travels 150 ft or more with minimal assistance (Pt.>75%)  Comprehension Comprehension Mode: Auditory Comprehension: 3-Understands basic 50 - 74% of the time/requires cueing 25 - 50%  of the time  Expression Expression Mode: Verbal Expression: 3-Expresses basic  50 - 74% of the time/requires cueing 25 - 50% of the time.  Needs to repeat parts of sentences.  Social Interaction Social Interaction: 4-Interacts appropriately 75 - 89% of the time - Needs redirection for appropriate language or to initiate interaction.  Problem Solving Problem Solving: 2-Solves basic 25 - 49% of the time - needs direction more than half the time to initiate, plan or complete simple activities  Memory Memory: 2-Recognizes or recalls 25 - 49% of the time/requires cueing 51 - 75% of the time   Medical Problem List and Plan:  1. Right SAH due to ACOM aneurysm. Status post coiling and IVC drain  2. DVT Prophylaxis/Anticoagulation: SCDs. Monitor for any signs of DVT  3. Pain Management: Oxycodone as needed. Monitor with increased mobility  4. Mood/delirium. Bed alarm for safety. Followup speech therapy for cognition  -showing improvement but has poor judgement and insight.  -fall safety planning 5. Neuropsych: This patient is not capable of making decisions on his/her own behalf.  6. Hyponatremia. Resolved. Off sodium and demeclocycline  7. Hypertension. Taper nimodipine  8.  Leukocytosis without fever or symptoms, UA clean--- culture negative  - afebrile  LOS (Days) 11 A FACE TO FACE EVALUATION WAS PERFORMED  , T 12/28/2012, 8:08 AM   Patient ID: Dennis Wyatt, male   DOB: [birth year], 53 y.o.   MRN: [id number] Subjective/Complaints: 52 y.o. right-handed male with history of hypertension. Admitted 12/03/2012 with headache and dizziness. Blood pressure noted to be 204/123. Cranial CT scan showed subarachnoid hemorrhage with concern for ruptured aneurysm. Four-vessel cerebral arteriogram showed 11 mm x 7 mm irregular right ACA A1 aneurysm. Interventional radiology consulted and patient underwent coiling of aneurysm, IVC drain. Patient did remain intubated for a time followed by critical care medicine. Latest followup cranial CT scan 12/13/2012 stable and external  ventricular drain has been removed. Close monitoring of blood pressure as patient remained on nimodipine x21 day protocol and await plan to taper . He is tolerating a regular consistency diet. Patient with bouts of hyponatremia 115 and placed on Declomycin as well as sodium chloride tablets with latest followup sodium level 135 on 12/16/2012  Patient is sleepy this morning, arouses to verbal. States name, incorrectly states place, unable to state day or date Review of Systems  Unable to perform ROS: mental acuity    Objective: Vital Signs: Blood pressure 163/80, pulse 66, temperature 98.7 F (37.1 C), temperature source Oral, resp. rate 19, weight 100.9 kg (222 lb 7.1 oz), SpO2 97.00%. No results found. Results for orders placed during the hospital encounter of 12/17/12 (from the past 72 hour(s))  GLUCOSE, CAPILLARY     Status: Abnormal   Collection Time    12/25/12  9:25 PM      Result Value Range   Glucose-Capillary 112 (*) 70 - 99 mg/dL      Prior drain sites closed/staples. No drainage.Eyes: EOM are normal.  Neck: Neck supple. No JVD present. No tracheal deviation present. No thyromegaly present.  Cardiovascular: Normal rate and regular rhythm. No murmurs rubs or galloops  Pulmonary/Chest: Breath sounds normal. No respiratory distress. No wheezes or rales or rhonchi  Abdominal: Soft. Bowel sounds are normal. He exhibits no distension. There is no tenderness.  Musculoskeletal: He exhibits no edema.  Lymphadenopathy:  He has no cervical adenopathy.  Neurological: He is lethargic  Patient was able to provide his name . Patient with limited awareness of his deficits. He did follow simple commands.  He needed cues to maintain arousal. Moved all 4's with 4 to 4+/5 strength with. No gross sensory deficits. A  little impulsive. No gross limb ataxia. Senses pain and LT in all 4 limbs.    Assessment/Plan: 1. Functional deficits secondary to [facility] due to ruptured ACOM aneurysm which require  3+ hours per day of interdisciplinary therapy in a comprehensive inpatient rehab setting. Physiatrist is providing close team supervision and 24 hour management of active medical problems listed below. Physiatrist and rehab team continue to assess barriers to discharge/monitor patient progress toward functional and medical goals. FIM: FIM - Bathing Bathing Steps Patient Completed: Chest;Right Arm;Left Arm;Abdomen;Front perineal area;Buttocks;Left lower leg (including foot);Right lower leg (including foot);Left upper leg;Right upper leg Bathing: 4: Steadying assist  FIM - Upper Body Dressing/Undressing Upper body dressing/undressing steps patient completed: Thread/unthread right sleeve of pullover shirt/dresss;Thread/unthread left sleeve of pullover shirt/dress;Put head through opening of pull over shirt/dress;Pull shirt over trunk Upper body dressing/undressing: 5: Set-up assist to: Obtain clothing/put away FIM - Lower Body Dressing/Undressing Lower body dressing/undressing steps patient completed: Pull underwear up/down;Thread/unthread right pants leg;Thread/unthread left pants leg;Pull pants up/down;Don/Doff left sock;Don/Doff right sock Lower body dressing/undressing: 4: Steadying Assist  FIM - Toileting Toileting steps completed by patient: Performs perineal hygiene Toileting: 4: Steadying assist  FIM - Diplomatic Services operational officer Devices: Walker;Grab bars Toilet Transfers: 4-From toilet/BSC: Min A (steadying Pt. > 75%);4-To toilet/BSC: Min A (steadying Pt. > 75%)  FIM - Bed/Chair Transfer Bed/Chair Transfer Assistive Devices: Therapist, occupational: 4: Bed > Chair or W/C: Min A (steadying Pt. > 75%);4: Chair or W/C > Bed: Min A (steadying Pt. > 75%)  FIM - Locomotion: Wheelchair Locomotion: Wheelchair: 2: Travels 50 - 149 ft with supervision, cueing or coaxing FIM - Locomotion: Ambulation Locomotion: Ambulation Assistive Devices: Designer, industrial/product Ambulation/Gait  Assistance: 4: Min assist Locomotion: Ambulation: 4: Travels 150 ft or more with minimal assistance (Pt.>75%)  Comprehension Comprehension Mode: Auditory Comprehension: 3-Understands basic 50 - 74% of the time/requires cueing 25 - 50%  of the time  Expression Expression Mode: Verbal Expression: 3-Expresses basic 50 - 74% of the time/requires cueing 25 - 50% of the time. Needs to repeat parts of sentences.  Social Interaction Social Interaction: 4-Interacts appropriately 75 - 89% of the time - Needs redirection for appropriate language or to initiate interaction.  Problem Solving Problem Solving: 2-Solves basic 25 - 49% of the time - needs direction more than half the time to initiate, plan or complete simple activities  Memory Memory: 2-Recognizes or recalls 25 - 49% of the time/requires cueing 51 - 75% of the time Medical Problem List and Plan:  1. Right SAH due to ACOM aneurysm. Status post coiling and IVC drain  2. DVT Prophylaxis/Anticoagulation: SCDs.   -popliteal, peroneal dvt's---xarelto initiated.  -recheck dopplers in a month 3. Pain Management: Oxycodone as needed. Monitor with increased mobility   -topamax 25mg  qhs initiated with some benefit---will increase to 50mg  4. Mood/delirium. Bed alarm for safety. Followup speech therapy for cognition  -showing improvement but has poor judgement and insight.  -is a fall risk----fall safety planning  -ritalin for attention/focus 5. Neuropsych: This patient is not capable of making decisions on his/her own behalf.  6. Hyponatremia. Resolved.  off Declomycin and  sodium chloride tablets. Followup chemistries  7. Hypertension. Discuss plan to taper nimodipine with IR 8.  Leukocytosis without fever or symptoms, ua negative, culture neg, monitor CBC for trend  - +DVT LOS (Days) 11 A FACE TO FACE EVALUATION WAS PERFORMED  , T 12/28/2012, 8:08 AM

## 2012-12-28 NOTE — Progress Notes (Signed)
Physical Therapy Session Note  Patient Details  Name: Dennis Wyatt MRN: [id number] Date of Birth: Dec 20, 1960  Today's Date: 12/28/2012 Time: 0900-0955 Time Calculation (min): 55 min  Short Term Goals: Week 2:  PT Short Term Goal 1 (Week 2): = unmet long term goals  Skilled Therapeutic Interventions/Progress Updates:    [place] with recreational therapist. Start of treatment pt verbalized need to urinate, was able to successfully make it to the bathroom without incontinence. Bathroom mobility with RW and min assist as pt tends to leave RW behind and is impulsive at times. Pt thought he had just had bowel movement immediately after urinating, had never sat down and PT had to convince pt that he had not gone. Pt also has poor placement of RW while trying to wash hands at sink, decreased ability to problem solve through correct placement. Pt washed hands 3 x due to confusion (putting soap on his hands after drying them off, etc).   Pt played [employer] game to promote dynamic balance, problem solving, safety awareness, and functional memory. Pt attempting to find wheelchair which was left at nurses station at start of treatment, max verbal cuing for pathfinding, attention, safety awareness, and for recall of short term memory. Niece provided min-guard assist/supervision ~75% of session with cues from therapist for caregiver  positioning and providing appropriate verbal cues.  Therapy Documentation Precautions:  Precautions Precautions: Fall Precaution Comments: confused, disoriented, light headed Restrictions Weight Bearing Restrictions: No Pain: Pain Assessment Pain Assessment: No/denies pain  See FIM for current functional status  Therapy/Group: Co-Treatment  Wilhemina Bonito 12/28/2012, 12:09 PM

## 2012-12-28 NOTE — Progress Notes (Signed)
Recreational Therapy Session Note  Patient Details  Name: Dennis Wyatt MRN: [id number] Date of Birth: [birth year] Today's Date: 12/28/2012 Time:  9-955 Pain: no c/o Skilled Therapeutic Interventions/Progress Updates: Pt stated need to toilet at beginning of session.  Pt ambulated with RW to bathroom with PT for toileting.  Pt ambulated from room to dayroom using RW with min assist and mod cues for safety.  Pt stood with 1 UE support on RW to play bocce ball in mod distractive dayroom with focus on dynamic standing balance, problem solving, STM, selective attention, & safety.  Pt required min assist and mod cues for cognition/safety.  Pt's niece present and participatory in hands on education.  Therapy/Group: Co-Treatment  [name] 12/28/2012, 12:17 PM

## 2012-12-29 ENCOUNTER — Inpatient Hospital Stay (HOSPITAL_COMMUNITY): Payer: No Typology Code available for payment source

## 2012-12-29 ENCOUNTER — Inpatient Hospital Stay (HOSPITAL_COMMUNITY): Payer: No Typology Code available for payment source | Admitting: Speech Pathology

## 2012-12-29 ENCOUNTER — Inpatient Hospital Stay (HOSPITAL_COMMUNITY): Payer: No Typology Code available for payment source | Admitting: Physical Therapy

## 2012-12-29 DIAGNOSIS — I10 Essential (primary) hypertension: Secondary | ICD-10-CM

## 2012-12-29 DIAGNOSIS — I609 Nontraumatic subarachnoid hemorrhage, unspecified: Secondary | ICD-10-CM

## 2012-12-29 NOTE — Progress Notes (Signed)
Pt had only one episode of urinary incontinence today, at 1700 after nap.  Has been verbalizing needs to staff in room and timed toileting effective.

## 2012-12-29 NOTE — Progress Notes (Signed)
Physical Therapy Note  Patient Details  Name: Dennis Wyatt MRN: [id number] Date of Birth: 02-09-1961 Today's Date: 12/29/2012  1300-1355 (55 minutes) individual Pain: no reported pain Focus of treatment: Therapeutic activity focused on recall/short term memory Treatment: Gait with RW min to close SBA (min assist when stepping over threshold) in area of nurses station .During gait 4 things were pointed out to pt; pt given 5 minute rest break and asked to find the 4 objects. Pt unable to recall any of the objects and required max vcs to locate during his gait . Pt unable to recall place, time, situation.    Lurleen Soltero,JIM 12/29/2012, 1:57 PM

## 2012-12-29 NOTE — Progress Notes (Signed)
Speech Language Pathology Daily Session Note  Patient Details  Name: Dennis Wyatt MRN: [id number] Date of Birth: [birth year]  Today's Date: 12/29/2012 Time: 1300-1355 Time Calculation (min): 55 min  Short Term Goals: Week 2: SLP Short Term Goal 1 (Week 2): Patient will demonstrate selective attention to familiar task for 10 minutes with Mod assist vebral cues for re-direction. SLP Short Term Goal 2 (Week 2): Patient will verbalize 3 deficits that impact his ability to safely complete basic ADLs with Mod question cues. SLP Short Term Goal 3 (Week 2): Patient will demonstrate basic problem solving with Min assist clinician cues. SLP Short Term Goal 4 (Week 2): Patient will utilize word finding strategies during structured tasks with Mod assist verbal cues.  Skilled Therapeutic Interventions: Pt. seen for cognitive therapy.  Pt. able to sustain attention for 5-8 minutes with min-mod verbal  cues while eating breakfast.  He required min verbal/visual cues for accuracy of temporal orientation and min-mild reminders to use calendar.  Pt. needed max assist for intellectual  awareness of deficits.  He began to confabulate and was certain he left rehab Friday and withdrew $2,500 from his bank.  Demonstrated basic problem solving with mod verbal cues.        FIM: See flow sheet for FIM    Pain Pain Assessment Pain Assessment: No/denies pain  Therapy/Group: Individual Therapy  Royce Macadamia 12/29/2012, 3:02 PM

## 2012-12-29 NOTE — Progress Notes (Signed)
Patient ID: Dennis Wyatt, male   DOB: 1960-09-06, 52 y.o.   MRN: [id number] Subjective/Complaints: No complaints. Using roll on deodorant for headache relief! Review of Systems      Objective: Vital Signs: Blood pressure 150/80, pulse 70, temperature 97.7 F (36.5 C), temperature source Oral, resp. rate 19, weight 100.9 kg (222 lb 7.1 oz), SpO2 96.00%. No results found. No results found for this or any previous visit (from the past 72 hour(s)).    Prior drain sites closed/staples. No drainage.Eyes: EOM are normal.  Neck: Neck supple. No JVD present. No tracheal deviation present. No thyromegaly present.  Cardiovascular: Normal rate and regular rhythm. No murmurs rubs or galloops  Pulmonary/Chest: Breath sounds normal. No respiratory distress. No wheezes or rales or rhonchi  Abdominal: Soft. Bowel sounds are normal. He exhibits no distension. There is no tenderness.  Musculoskeletal: He exhibits no edema.  Lymphadenopathy:  He has no cervical adenopathy.  Neurological: He is lethargic  Patient was able to provide his name . Recalled current events. Patient with limited awareness of his deficits. He did follow simple commands.  He needed cues to maintain arousal. Moved all 4's with 4 to 4+/5 strength with. No gross sensory deficits. A little impulsive. No gross limb ataxia. Senses pain and LT in all 4 limbs.    Assessment/Plan: 1. Functional deficits secondary to [facility] due to ruptured ACOM aneurysm which require 3+ hours per day of interdisciplinary therapy in a comprehensive inpatient rehab setting. Physiatrist is providing close team supervision and 24 hour management of active medical problems listed below. Physiatrist and rehab team continue to assess barriers to discharge/monitor patient progress toward functional and medical goals.  FIM: FIM - Bathing Bathing Steps Patient Completed: Chest;Right Arm;Left Arm;Abdomen;Front perineal area;Buttocks;Left lower leg (including foot);Right  lower leg (including foot);Left upper leg;Right upper leg Bathing: 4: Steadying assist  FIM - Upper Body Dressing/Undressing Upper body dressing/undressing steps patient completed: Thread/unthread right sleeve of pullover shirt/dresss;Thread/unthread left sleeve of pullover shirt/dress;Put head through opening of pull over shirt/dress;Pull shirt over trunk Upper body dressing/undressing: 5: Set-up assist to: Obtain clothing/put away FIM - Lower Body Dressing/Undressing Lower body dressing/undressing steps patient completed: Pull underwear up/down;Thread/unthread right pants leg;Thread/unthread left pants leg;Pull pants up/down;Don/Doff left sock;Don/Doff right sock Lower body dressing/undressing: 4: Steadying Assist  FIM - Toileting Toileting steps completed by patient: Performs perineal hygiene Toileting: 4: Steadying assist  FIM - Diplomatic Services operational officer Devices: Walker;Grab bars Toilet Transfers: 4-From toilet/BSC: Min A (steadying Pt. > 75%);4-To toilet/BSC: Min A (steadying Pt. > 75%)  FIM - Bed/Chair Transfer Bed/Chair Transfer Assistive Devices: Therapist, occupational: 4: Bed > Chair or W/C: Min A (steadying Pt. > 75%);4: Chair or W/C > Bed: Min A (steadying Pt. > 75%)  FIM - Locomotion: Wheelchair Locomotion: Wheelchair: 2: Travels 50 - 149 ft with supervision, cueing or coaxing FIM - Locomotion: Ambulation Locomotion: Ambulation Assistive Devices: Designer, industrial/product Ambulation/Gait Assistance: 4: Min guard Locomotion: Ambulation: 4: Travels 150 ft or more with minimal assistance (Pt.>75%)  Comprehension Comprehension Mode: Auditory Comprehension: 3-Understands basic 50 - 74% of the time/requires cueing 25 - 50%  of the time  Expression Expression Mode: Verbal Expression: 3-Expresses basic 50 - 74% of the time/requires cueing 25 - 50% of the time. Needs to repeat parts of sentences.  Social Interaction Social Interaction: 4-Interacts appropriately 75  - 89% of the time - Needs redirection for appropriate language or to initiate interaction.  Problem Solving Problem Solving: 2-Solves basic 25 - 49%  of the time - needs direction more than half the time to initiate, plan or complete simple activities  Memory Memory: 2-Recognizes or recalls 25 - 49% of the time/requires cueing 51 - 75% of the time   Medical Problem List and Plan:  1. Right SAH due to ACOM aneurysm. Status post coiling and IVC drain  2. DVT Prophylaxis/Anticoagulation: SCDs. Monitor for any signs of DVT  3. Pain Management: Oxycodone as needed. Monitor with increased mobility   -topamax very helpful for headache 4. Mood/delirium. Bed alarm for safety. Followup speech therapy for cognition  -showing improvement but still with limited judgement and insight.   5. Neuropsych: This patient is not capable of making decisions on his/her own behalf.  6. Hyponatremia. Resolved. Off sodium and demeclocycline  7. Hypertension. Taper nimodipine  8.  Leukocytosis without fever or symptoms, UA clean--- culture negative  - afebrile  LOS (Days) 12 A FACE TO FACE EVALUATION WAS PERFORMED  , T 12/29/2012, 8:36 AM

## 2012-12-29 NOTE — Progress Notes (Signed)
Occupational Therapy Session Note  Patient Details  Name: Dennis Wyatt MRN: [id number] Date of Birth: 21-Jan-1961  Today's Date: 12/29/2012  Session 1 Time: 1000-1059 Time Calculation (min): 59 min  Short Term Goals: Week 2:  OT Short Term Goal 1 (Week 2): pt will identify need to void and wait for assistance to arrive OT Short Term Goal 2 (Week 2): patient will perform toilet transfer with supervision OT Short Term Goal 3 (Week 2): pt will perform toileting tasks with supervison  Skilled Therapeutic Interventions/Progress Updates:    Pt in bed upon arrival with niece (Dennis Wyatt) at bedside.  Pt's niece provided appropriate verbal cues/supervision for patient with shower and dressing tasks.  Therapist educated niece regarding recommendation for 24 hour supervision and providing steady A when ambulating.  Pt's niece verbalized understanding and return demonstrated assistance when ambulating.  Pt required verbal cues for problem solving and attention to task.  Focus on education, dynamic standing balance, attention to task, and safety awareness.  Therapy Documentation Precautions:  Precautions Precautions: Fall Precaution Comments: confused, disoriented, light headed Restrictions Weight Bearing Restrictions: No   Pain: Pain Assessment Pain Assessment: No/denies pain  See FIM for current functional status  Therapy/Group: Individual Therapy  Session 2 Time: 1400-1430 Pt denies pain Individual Therapy  Pt's niece present for continued education.  Pt practiced tub bench transfers X 2 in tub room.  Pt exhibited increased confusion with multi step directions but demonstrated ability to safely follow one step commands.  Recommended to niece to give only one step commands/directions to patient to decrease confusion.  Niece verbalize understanding and provided appropriate directions.  Pt completed tub bench transfers with supervision and max verbal cues.  Pt attempted to stand and walk to  w/c without AD X 1 and required max verbal cues and tactile cues to have patient sit back down.  [place], Boehle [facility] 12/29/2012, 11:02 AM

## 2012-12-29 NOTE — Patient Care Conference (Signed)
Inpatient RehabilitationTeam Conference and Plan of Care Update Date: 12/28/2012   Time: 3:15 PM    Patient Name: Dennis Wyatt      Medical Record Number: [id number]  Date of Birth: 04-01-1961 Sex: Male         Room/Bed: 4027/4027-01 Payor Info: Payor: COVENTRY / Plan: COVENTRY / Product Type: *No Product type* /    Admitting Diagnosis: SAH from ANEURYSM  Admit Date/Time:  12/17/2012  3:32 PM Admission Comments: No comment available   Primary Diagnosis:  SAH (subarachnoid hemorrhage) Principal Problem: SAH (subarachnoid hemorrhage)  Patient Active Problem List   Diagnosis Date Noted  . SAH (subarachnoid hemorrhage) 12/20/2012  . Hyponatremia 12/13/2012  . Hyperglycemia 12/06/2012  . Subarachnoid hemorrhage from anterior communicating artery aneurysm 12/05/2012  . Anterior communicating artery aneurysm 12/05/2012  . Acute respiratory failure 12/03/2012  . Malignant hypertension 12/03/2012  . Altered mental status 12/03/2012    Expected Discharge Date: Expected Discharge Date: 01/01/13  Team Members Present: Physician leading conference: Dr. Faith Rogue Social Worker Present: Amada Jupiter, LCSW Nurse Present: Carlean Purl, RN PT Present: Other (comment) Sherrine Maples, PT) OT Present: Roney Mans, Loistine Chance, OT;Ardis Rowan, COTA Other (Discipline and Name): Tora Duck, PPS Coordinator     Current Status/Progress Goal Weekly Team Focus  Medical   sleep, pain/headache better. still with confusion/cognitive issues  see prior, pain reduction  sleep restoration, pain control   Bowel/Bladder   Incontinent bladder; timed toileting minimally effective;  Continent bowel last 48hrs.  continent with timed toileting.  Timed toileting q 2hrs. while awake   Swallow/Nutrition/ Hydration             ADL's   min A/close supervision overall; continued decreased awareness, confusion, impulsivity  supervision overall  safety awareness, functional ambulation, family education    Mobility   min/supervision  supervision  Awareness, attention, family education, dynamic balance +/- cognitive challenges.    Communication             Safety/Cognition/ Behavioral Observations            Pain   Headache, chronic back pain; Oxy 10mg  effective; takes 3-4x's a day; K-Pad to back.  Managed at 3/10  Monitor, offer alternate methods for pain control.   Skin   open abrasion left arm; tegaderm; scalp incision healed  no breakdown, no s/s infection  Monitor    Rehab Goals Patient on target to meet rehab goals: Yes *See Care Plan and progress notes for long and short-term goals.  Barriers to Discharge: cognitive deficits, see prior    Possible Resolutions to Barriers:  routine, cognitive perceptual rx, see prior    Discharge Planning/Teaching Needs:  Pt to d/c to his brother's home with neice to provide 24/7 assistance.      Team Discussion:  Decreasing HA.  Orientation still fluctuates buy improved cognition overall.  Improved continence with timed toileting.  On track for d/c end of week. Niece has been attending therapies almost daily.   Revisions: None   Continued Need for Acute Rehabilitation Level of Care: The patient requires daily medical management by a physician with specialized training in physical medicine and rehabilitation for the following conditions: Daily direction of a multidisciplinary physical rehabilitation program to ensure safe treatment while eliciting the highest outcome that is of practical value to the patient.: Yes Daily medical management of patient stability for increased activity during participation in an intensive rehabilitation regime.: Yes Daily analysis of laboratory values and/or radiology reports with any subsequent need  for medication adjustment of medical intervention for : Neurological problems;Post surgical problems (pain)  [name] 12/29/2012, 10:42 AM

## 2012-12-30 ENCOUNTER — Inpatient Hospital Stay (HOSPITAL_COMMUNITY): Payer: No Typology Code available for payment source | Admitting: *Deleted

## 2012-12-30 ENCOUNTER — Inpatient Hospital Stay (HOSPITAL_COMMUNITY): Payer: No Typology Code available for payment source

## 2012-12-30 ENCOUNTER — Inpatient Hospital Stay (HOSPITAL_COMMUNITY): Payer: No Typology Code available for payment source | Admitting: Physical Therapy

## 2012-12-30 ENCOUNTER — Inpatient Hospital Stay (HOSPITAL_COMMUNITY): Payer: No Typology Code available for payment source | Admitting: Speech Pathology

## 2012-12-30 MED ORDER — NIMODIPINE 30 MG PO CAPS
60.0000 mg | ORAL_CAPSULE | Freq: Three times a day (TID) | ORAL | Status: DC
  Administered 2012-12-30 – 2012-12-31 (×3): 60 mg via ORAL
  Filled 2012-12-30 (×6): qty 2

## 2012-12-30 NOTE — Progress Notes (Signed)
Patient ID: Dennis Wyatt, male   DOB: 1961/05/18, 52 y.o.   MRN: [id number] Subjective/Complaints: Has a headache this am. Got up without assist Review of Systems      Objective: Vital Signs: Blood pressure 152/85, pulse 75, temperature 98 F (36.7 C), temperature source Oral, resp. rate 18, weight 97.614 kg (215 lb 3.2 oz), SpO2 100.00%. No results found. No results found for this or any previous visit (from the past 72 hour(s)).    Prior drain sites closed/staples. No drainage.Eyes: EOM are normal.  Neck: Neck supple. No JVD present. No tracheal deviation present. No thyromegaly present.  Cardiovascular: Normal rate and regular rhythm. No murmurs rubs or galloops  Pulmonary/Chest: Breath sounds normal. No respiratory distress. No wheezes or rales or rhonchi  Abdominal: Soft. Bowel sounds are normal. He exhibits no distension. There is no tenderness.  Musculoskeletal: He exhibits no edema.  Lymphadenopathy:  He has no cervical adenopathy.  Neurological: He is lethargic  Patient was able to provide his name . Recalled current events. Patient with limited awareness of his deficits. He did follow simple commands.  He needed cues to maintain arousal. Moved all 4's with 4 to 4+/5 strength with. No gross sensory deficits. A little impulsive. No gross limb ataxia. Senses pain and LT in all 4 limbs.    Assessment/Plan: 1. Functional deficits secondary to [facility] due to ruptured ACOM aneurysm which require 3+ hours per day of interdisciplinary therapy in a comprehensive inpatient rehab setting. Physiatrist is providing close team supervision and 24 hour management of active medical problems listed below. Physiatrist and rehab team continue to assess barriers to discharge/monitor patient progress toward functional and medical goals.  FIM: FIM - Bathing Bathing Steps Patient Completed: Chest;Right Arm;Left Arm;Abdomen;Front perineal area;Buttocks;Left lower leg (including foot);Right lower leg  (including foot);Left upper leg;Right upper leg Bathing: 5: Supervision: Safety issues/verbal cues  FIM - Upper Body Dressing/Undressing Upper body dressing/undressing steps patient completed: Thread/unthread right sleeve of pullover shirt/dresss;Thread/unthread left sleeve of pullover shirt/dress;Put head through opening of pull over shirt/dress;Pull shirt over trunk Upper body dressing/undressing: 5: Set-up assist to: Obtain clothing/put away FIM - Lower Body Dressing/Undressing Lower body dressing/undressing steps patient completed: Pull underwear up/down;Thread/unthread right pants leg;Thread/unthread left pants leg;Pull pants up/down;Don/Doff left sock;Don/Doff right sock;Thread/unthread right underwear leg;Thread/unthread left underwear leg;Fasten/unfasten pants;Don/Doff right shoe;Don/Doff left shoe;Fasten/unfasten right shoe;Fasten/unfasten left shoe Lower body dressing/undressing: 5: Supervision: Safety issues/verbal cues  FIM - Toileting Toileting steps completed by patient: Performs perineal hygiene Toileting Assistive Devices: Grab bar or rail for support Toileting: 2: Max-Patient completed 1 of 3 steps  FIM - Diplomatic Services operational officer Devices: Best boy Transfers: 4-To toilet/BSC: Min A (steadying Pt. > 75%);4-From toilet/BSC: Min A (steadying Pt. > 75%)  FIM - Bed/Chair Transfer Bed/Chair Transfer Assistive Devices: Therapist, occupational: 4: Bed > Chair or W/C: Min A (steadying Pt. > 75%);4: Chair or W/C > Bed: Min A (steadying Pt. > 75%)  FIM - Locomotion: Wheelchair Locomotion: Wheelchair: 2: Travels 50 - 149 ft with supervision, cueing or coaxing FIM - Locomotion: Ambulation Locomotion: Ambulation Assistive Devices: Designer, industrial/product Ambulation/Gait Assistance: 4: Min guard Locomotion: Ambulation: 4: Travels 150 ft or more with minimal assistance (Pt.>75%)  Comprehension Comprehension Mode: Auditory Comprehension: 3-Understands basic  50 - 74% of the time/requires cueing 25 - 50%  of the time  Expression Expression Mode: Verbal Expression: 4-Expresses basic 75 - 89% of the time/requires cueing 10 - 24% of the time. Needs helper to occlude trach/needs to repeat  words.  Social Interaction Social Interaction: 4-Interacts appropriately 75 - 89% of the time - Needs redirection for appropriate language or to initiate interaction.  Problem Solving Problem Solving: 3-Solves basic 50 - 74% of the time/requires cueing 25 - 49% of the time  Memory Memory: 1-Recognizes or recalls less than 25% of the time/requires cueing greater than 75% of the time   Medical Problem List and Plan:  1. Right SAH due to ACOM aneurysm. Status post coiling and IVC drain  2. DVT Prophylaxis/Anticoagulation: SCDs. Monitor for any signs of DVT  3. Pain Management: Oxycodone as needed. Monitor with increased mobility   -topamax  helpful for headache 4. Mood/delirium. Bed alarm for safety. Followup speech therapy for cognition  -showing improvement but still with limited judgement and insight.   5. Neuropsych: This patient is not capable of making decisions on his/her own behalf.  6. Hyponatremia. Resolved. Off sodium and demeclocycline  7. Hypertension. Taper nimodipine further today 8.  Leukocytosis without fever or symptoms, UA clean--- culture negative  - afebrile  LOS (Days) 13 A FACE TO FACE EVALUATION WAS PERFORMED  SWARTZ,ZACHARY T 12/30/2012, 8:34 AM

## 2012-12-30 NOTE — Progress Notes (Signed)
Physical Therapy Session Note  Patient Details  Name: Dennis Wyatt MRN: [id number] Date of Birth: [birth year]  Today's Date: 12/30/2012 Time: 0900-1000 Time Calculation (min): 60 min  Short Term Goals: Week 2:  PT Short Term Goal 1 (Week 2): = unmet long term goals  Skilled Therapeutic Interventions/Progress Updates:    Ambulation x 200', 150', 100', 300' with RW and supervision, pt confused throughout unable to find room despite multiple attempts and max verbal cues pt has to have step by step cues to find room.  Home environment ambulation x 50' with RW and supervision, forwards/side stepping/back stepping. Bed mobility ADL bed with supervision, cues for safety with RW management as pt places walker in front of bed then tries to walk around it. Sit <> stand from low couch with supervision. Obstacle course through simulated community environemnt around and over obstacles, over compliant uneven surface with close supervision. Stairs x 5 with HHA/min assist and cues for not using railing. Pathfinding, memory, safety awareness, orientation, emergent awareness all addressed during session.   No family present for family education  Therapy Documentation Precautions:  Precautions Precautions: Fall Precaution Comments: confused, disoriented, light headed Restrictions Weight Bearing Restrictions: No Pain: Pain Assessment Pain Assessment: 0-10 Pain Score:   8 Pain Type: Acute pain Pain Location: Head Pain Orientation: Anterior Pain Descriptors / Indicators: Headache Pain Frequency: Constant Pain Onset: On-going Patients Stated Pain Goal: 3 Pain Intervention(s): RN made aware  Multiple Pain Sites: No  See FIM for current functional status  Therapy/Group: Individual Therapy  Wilhemina Bonito 12/30/2012, 12:32 PM

## 2012-12-30 NOTE — Progress Notes (Addendum)
Recreational Therapy Session Note  Patient Details  Name: Brenner Visconti MRN: [id number] Date of Birth: 01-18-1961 Today's Date: 12/30/2012 Time:  1315-1345 Pain: c/o HA, premedicated Skilled Therapeutic Interventions/Progress Updates: Session focused on activity tolerance, dynamic standing balance, ambulation using RW, safety awareness, & orientation.  Pt required close supervision-occasional min assist for balance & max cues for safety and orientation.  Pt's niece present observing and assisting with cuing.  Therapy/Group: Co-Treatment  Adin Lariccia 12/30/2012, 4:11 PM

## 2012-12-30 NOTE — Progress Notes (Signed)
Social Work Patient ID: Dennis Wyatt, male   DOB: 1960-09-24, 52 y.o.   MRN: [id number]  Met with pt and niece yesterday to review team conference and d/c arrangements.  Both aware and agreeable with continued target for d/c 6/21.  Niece reports plan is for pt to d/c to his own home and that she will stay with him there.  Education has been ongoing and niece return demonstrating for therapists who all report she is doing very well.  Will set up OP therapies and niece reports she can provide needed transportation.  Continue to follow.  Jacklyne Baik, LCSW

## 2012-12-30 NOTE — Progress Notes (Signed)
Speech Language Pathology Daily Session Note  Patient Details  Name: Dennis Wyatt MRN: [id number] Date of Birth: 02/08/1961  Today's Date: 12/30/2012 Time: 1130-1200 Time Calculation (min): 30 min  Short Term Goals: Week 2: SLP Short Term Goal 1 (Week 2): Patient will demonstrate selective attention to familiar task for 10 minutes with Mod assist vebral cues for re-direction. SLP Short Term Goal 2 (Week 2): Patient will verbalize 3 deficits that impact his ability to safely complete basic ADLs with Mod question cues. SLP Short Term Goal 3 (Week 2): Patient will demonstrate basic problem solving with Min assist clinician cues. SLP Short Term Goal 4 (Week 2): Patient will utilize word finding strategies during structured tasks with Mod assist verbal cues.  Skilled Therapeutic Interventions: Treatment focused on cognition with pt.'s neice present who will be primary caretaker at discharge.  Initially, pt. slightly lethargic but once engaged in activity he sustained attention to therapy activity for approximately 15 minutes with supervision cues.   He exhibited basic problem solving (addition, predicitng) with moderate verbal/visual cues for 1st activity and moderate verbal/visual during 2nd task.  SLP has not observed word finding deficits during the past 2 sessions.  He required less direction/cues today to stay on topic.      FIM:  Comprehension Comprehension Mode: Auditory Comprehension: 3-Understands basic 50 - 74% of the time/requires cueing 25 - 50%  of the time Expression Expression Mode: Verbal Expression: 3-Expresses basic 50 - 74% of the time/requires cueing 25 - 50% of the time. Needs to repeat parts of sentences. Social Interaction Social Interaction: 4-Interacts appropriately 75 - 89% of the time - Needs redirection for appropriate language or to initiate interaction. Problem Solving Problem Solving: 2-Solves basic 25 - 49% of the time - needs direction more than half the time  to initiate, plan or complete simple activities Memory Memory: 1-Recognizes or recalls less than 25% of the time/requires cueing greater than 75% of the time  Pain Pain Assessment Pain Assessment: 0-10 Pain Score:   8 Pain Type: Acute pain Pain Location: Head Pain Orientation: Anterior Pain Descriptors / Indicators: Headache Pain Frequency: Constant Pain Onset: On-going Patients Stated Pain Goal: 3 Pain Intervention(s): RN made aware Multiple Pain Sites: No  Therapy/Group: Individual Therapy  Royce Macadamia 12/30/2012, 12:53 PM

## 2012-12-30 NOTE — Progress Notes (Signed)
Occupational Therapy Session Note  Patient Details  Name: Dennis Wyatt MRN: [id number] Date of Birth: [birth year]  Today's Date: 12/30/2012  Session 1 Time: 1000-1058 Time Calculation (min): 58 min  Short Term Goals: Week 2:  OT Short Term Goal 1 (Week 2): pt will identify need to void and wait for assistance to arrive OT Short Term Goal 2 (Week 2): patient will perform toilet transfer with supervision OT Short Term Goal 3 (Week 2): pt will perform toileting tasks with supervison  Skilled Therapeutic Interventions/Progress Updates:    Pt engaged in bathing at tub/shower level in ADL apartment and dressing with sit<>stand from chair.  Pt required increased verbal cues this morning and max A for orientation.  Pt continued to recall "conversations and events" that he related happened earlier in day or the day before.  These events were in locales other than hospital.  Pt required max verbal cues for task initiation and redirection to task.  Pt completed tasks with occasional steady assist.  Pt continued to exhibit confusion throughout session.  Therapy Documentation Precautions:  Precautions Precautions: Fall Precaution Comments: confused, disoriented, light headed Restrictions Weight Bearing Restrictions: No   Pain: Pain Assessment Pain Assessment: No/denies pain Pain Score:   8 Pain Type: Acute pain Pain Location: Head Pain Orientation: Anterior Pain Descriptors / Indicators: Headache Pain Frequency: Constant Pain Onset: On-going Patients Stated Pain Goal: 3 Pain Intervention(s): Medication (See eMAR);Repositioned Multiple Pain Sites: No  See FIM for current functional status  Therapy/Group: Individual Therapy  Session 2 Time: 1300-1345 Pt c/o 7/10 headache; RN aware Individual Therapy  Cotx with Recreational Therapist for last 30 mins.  Pt engaged in bed mobility and dynamic standing tasks, functional amb with RW, and gathering items from floor.  Pt exhibited  continued confusion as noted in morning session.  Pt required max verbal cues for reorientation and safety awareness.  Pt insistent that he was not at hospital yesterday afternoon.  [name], Sheley [facility] 12/30/2012, 11:01 AM

## 2012-12-31 ENCOUNTER — Inpatient Hospital Stay (HOSPITAL_COMMUNITY): Payer: No Typology Code available for payment source | Admitting: Physical Therapy

## 2012-12-31 ENCOUNTER — Inpatient Hospital Stay (HOSPITAL_COMMUNITY): Payer: No Typology Code available for payment source | Admitting: Speech Pathology

## 2012-12-31 ENCOUNTER — Inpatient Hospital Stay (HOSPITAL_COMMUNITY): Payer: No Typology Code available for payment source

## 2012-12-31 DIAGNOSIS — I609 Nontraumatic subarachnoid hemorrhage, unspecified: Secondary | ICD-10-CM

## 2012-12-31 DIAGNOSIS — I10 Essential (primary) hypertension: Secondary | ICD-10-CM

## 2012-12-31 MED ORDER — LISINOPRIL 20 MG PO TABS
20.0000 mg | ORAL_TABLET | Freq: Every day | ORAL | Status: DC
  Administered 2012-12-31: 20 mg via ORAL
  Filled 2012-12-31 (×3): qty 1

## 2012-12-31 MED ORDER — LISINOPRIL 20 MG PO TABS: 20.0000 mg | ORAL_TABLET | Freq: Every day | ORAL | Status: DC

## 2012-12-31 MED ORDER — NICOTINE 21 MG/24HR TD PT24: MEDICATED_PATCH | TRANSDERMAL | Status: DC

## 2012-12-31 MED ORDER — QUETIAPINE FUMARATE 25 MG PO TABS: 25.0000 mg | ORAL_TABLET | Freq: Every day | ORAL | Status: DC

## 2012-12-31 MED ORDER — RIVAROXABAN 15 MG PO TABS: 15.0000 mg | ORAL_TABLET | Freq: Two times a day (BID) | ORAL | Status: DC

## 2012-12-31 MED ORDER — TOPIRAMATE 50 MG PO TABS: 50.0000 mg | ORAL_TABLET | Freq: Every day | ORAL | Status: DC

## 2012-12-31 MED ORDER — METHYLPHENIDATE HCL 5 MG PO TABS: 5.0000 mg | ORAL_TABLET | Freq: Two times a day (BID) | ORAL | Status: DC

## 2012-12-31 MED ORDER — OXYCODONE HCL 10 MG PO TABS: 10.0000 mg | ORAL_TABLET | ORAL | Status: DC | PRN

## 2012-12-31 MED ORDER — NIMODIPINE 30 MG PO CAPS
30.0000 mg | ORAL_CAPSULE | Freq: Three times a day (TID) | ORAL | Status: DC
  Administered 2012-12-31 – 2013-01-01 (×3): 30 mg via ORAL
  Filled 2012-12-31 (×6): qty 1

## 2012-12-31 NOTE — Progress Notes (Signed)
Patient ID: Dennis Wyatt, male   DOB: 21-Feb-1961, 52 y.o.   MRN: [id number] Subjective/Complaints: Has a headache this am. Got up without assist Review of Systems      Objective: Vital Signs: Blood pressure 169/90, pulse 63, temperature 97 F (36.1 C), temperature source Oral, resp. rate 18, weight 97 kg (213 lb 13.5 oz), SpO2 97.00%. No results found. No results found for this or any previous visit (from the past 72 hour(s)).    Prior drain sites closed/staples. No drainage.Eyes: EOM are normal.  Neck: Neck supple. No JVD present. No tracheal deviation present. No thyromegaly present.  Cardiovascular: Normal rate and regular rhythm. No murmurs rubs or galloops  Pulmonary/Chest: Breath sounds normal. No respiratory distress. No wheezes or rales or rhonchi  Abdominal: Soft. Bowel sounds are normal. He exhibits no distension. There is no tenderness.  Musculoskeletal: He exhibits no edema.  Lymphadenopathy:  He has no cervical adenopathy.  Neurological: He is lethargic  Patient was able to provide his name . Recalled current events. Patient with limited awareness of his deficits. He did follow simple commands.  He needed cues to maintain arousal. Moved all 4's with 4 to 4+/5 strength with. No gross sensory deficits. A little impulsive. No gross limb ataxia. Senses pain and LT in all 4 limbs.    Assessment/Plan: 1. Functional deficits secondary to [facility] due to ruptured ACOM aneurysm which require 3+ hours per day of interdisciplinary therapy in a comprehensive inpatient rehab setting. Physiatrist is providing close team supervision and 24 hour management of active medical problems listed below. Physiatrist and rehab team continue to assess barriers to discharge/monitor patient progress toward functional and medical goals.  FIM: FIM - Bathing Bathing Steps Patient Completed: Chest;Right Arm;Left Arm;Abdomen;Front perineal area;Buttocks;Left lower leg (including foot);Right lower leg  (including foot);Left upper leg;Right upper leg Bathing: 5: Supervision: Safety issues/verbal cues  FIM - Upper Body Dressing/Undressing Upper body dressing/undressing steps patient completed: Thread/unthread right sleeve of pullover shirt/dresss;Thread/unthread left sleeve of pullover shirt/dress;Put head through opening of pull over shirt/dress;Pull shirt over trunk Upper body dressing/undressing: 5: Set-up assist to: Obtain clothing/put away FIM - Lower Body Dressing/Undressing Lower body dressing/undressing steps patient completed: Pull underwear up/down;Thread/unthread right pants leg;Thread/unthread left pants leg;Pull pants up/down;Don/Doff left sock;Don/Doff right sock;Thread/unthread right underwear leg;Thread/unthread left underwear leg;Fasten/unfasten pants;Don/Doff right shoe;Don/Doff left shoe;Fasten/unfasten right shoe;Fasten/unfasten left shoe Lower body dressing/undressing: 5: Supervision: Safety issues/verbal cues  FIM - Toileting Toileting steps completed by patient: Performs perineal hygiene Toileting Assistive Devices: Grab bar or rail for support Toileting: 2: Max-Patient completed 1 of 3 steps  FIM - Diplomatic Services operational officer Devices: Best boy Transfers: 4-To toilet/BSC: Min A (steadying Pt. > 75%);4-From toilet/BSC: Min A (steadying Pt. > 75%)  FIM - Bed/Chair Transfer Bed/Chair Transfer Assistive Devices: Therapist, occupational: 4: Bed > Chair or W/C: Min A (steadying Pt. > 75%);4: Chair or W/C > Bed: Min A (steadying Pt. > 75%)  FIM - Locomotion: Wheelchair Locomotion: Wheelchair: 2: Travels 50 - 149 ft with supervision, cueing or coaxing FIM - Locomotion: Ambulation Locomotion: Ambulation Assistive Devices: Designer, industrial/product Ambulation/Gait Assistance: 4: Min guard Locomotion: Ambulation: 4: Travels 150 ft or more with minimal assistance (Pt.>75%)  Comprehension Comprehension Mode: Auditory Comprehension: 3-Understands basic  50 - 74% of the time/requires cueing 25 - 50%  of the time  Expression Expression Mode: Verbal Expression: 3-Expresses basic 50 - 74% of the time/requires cueing 25 - 50% of the time. Needs to repeat parts of sentences.  Social Interaction Social Interaction: 4-Interacts appropriately 75 - 89% of the time - Needs redirection for appropriate language or to initiate interaction.  Problem Solving Problem Solving: 2-Solves basic 25 - 49% of the time - needs direction more than half the time to initiate, plan or complete simple activities  Memory Memory: 1-Recognizes or recalls less than 25% of the time/requires cueing greater than 75% of the time   Medical Problem List and Plan:  1. Right SAH due to ACOM aneurysm. Status post coiling and IVC drain  2. DVT Prophylaxis/Anticoagulation: SCDs. Monitor for any signs of DVT  3. Pain Management: Oxycodone as needed. Monitor with increased mobility   -topamax  helpful for headache 4. Mood/delirium. Bed alarm for safety. Followup speech therapy for cognition  -showing improvement but still with limited judgement and insight.   5. Neuropsych: This patient is not capable of making decisions on his/her own behalf.  6. Hyponatremia. Resolved. Off sodium and demeclocycline  7. Hypertension. Taper nimodipine to off---will work on better bp control 8.  Leukocytosis without fever or symptoms, UA clean--- culture negative  - afebrile  LOS (Days) 14 A FACE TO FACE EVALUATION WAS PERFORMED  Ledora Delker T 12/31/2012, 8:15 AM

## 2012-12-31 NOTE — Discharge Summary (Signed)
  Discharge summary job [id number]

## 2012-12-31 NOTE — Discharge Instructions (Signed)
Inpatient Rehab Discharge Instructions  Henryk Ursin Discharge date and time: No discharge date for patient encounter.   Activities/Precautions/ Functional Status: Activity: activity as tolerated Diet: regular diet Wound Care: none needed Functional status:  ___ No restrictions     ___ Walk up steps independently __x_ 24/7 supervision/assistance   ___ Walk up steps with assistance ___ Intermittent supervision/assistance  ___ Bathe/dress independently ___ Walk with walker     ___ Bathe/dress with assistance ___ Walk Independently    ___ Shower independently __x_ Walk with assistance    ___ Shower with assistance ___ No alcohol     ___ Return to work/school ________   COMMUNITY REFERRALS UPON DISCHARGE:    Outpatient: PT     OT    ST                  Agency:  [facility]      Phone:  [phone]              Appointment Date/Time:  01/04/13 8:45 am - 10:15 am (arrive 8:15 am) and 01/18/13 @ 11:45 am  Medical Equipment/Items Ordered:  Wheelchair, cushion, walker, commode and tub bench                                                       Agency/Supplier:  [facility] @ [phone]   GENERAL COMMUNITY RESOURCES FOR PATIENT/FAMILY:  Support Groups: Stroke Warriors (handout provided)  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 I will adhere to these goals and the provided educational materials after my discharge from the hospital.  Patient/Caregiver Signature _______________________________ Date __________  Clinician Signature _______________________________________ Date __________  Please bring this form and your medication list with you to all your follow-up doctor's appointments.

## 2012-12-31 NOTE — Progress Notes (Signed)
Social Work  Discharge Note  The overall goal for the admission was met for:   Discharge location: Yes - to his own home with niece providing 24/7 assistance  Length of Stay: Yes - 15 days  Discharge activity level: Yes - supervision overall  Home/community participation: Yes  Services provided included: MD, RD, PT, OT, SLP, RN, TR, Pharmacy and SW  Financial Services: Private Insurance: [employer]  Follow-up services arranged: Outpatient: PT, OT, ST via [facility], DME: 20x18 lightwt w/c with back and seat cushions, rolling walker, 3n1 commode and tub bench via [facility] and Patient/Family has no preference for HH/DME agencies  Comments (or additional information):  Patient/Family verbalized understanding of follow-up arrangements: Yes  Individual responsible for coordination of the follow-up plan: patient  Confirmed correct DME delivered: Maryssa Giampietro 12/31/2012    Densel Kronick

## 2012-12-31 NOTE — Progress Notes (Signed)
Speech Language Pathology Discharge Summary  Patient Details  Name: Dennis Wyatt MRN: [id number] Date of Birth: [birth year]  Today's Date: 12/31/2012 Time: 1354-1430 Time Calculation (min): 36 min  Patient has met 5 of 5 long term goals.  Patient to discharge at overall Supervision;Min level.  Reasons goals not met:     Clinical Impression/Discharge Summary: Patient has demonstrated improved cognitive functioning in areas of attention, orientation to time/place, basic functional problem solving, and awareness to deficits. He continues to benefit from supervision for safety and minimal assistance to complete functional tasks, and min-moderate cues for multiple step/mild complex tasks and higher level attention(alternating).  He will benefit from supervision for safety at home, and continued, skilled speech-language therapy at home vs. outpatient.   Care Partner:  Caregiver Able to Provide Assistance: Yes  Type of Caregiver Assistance: Physical;Cognitive  Recommendation:  24 hour supervision/assistance;Home Health SLP;Outpatient SLP;Other (comment) (homehealth vs. outpatient)  Rationale for SLP Follow Up: Maximize cognitive function and independence;Reduce caregiver burden   Equipment:   None recommended by SLP  Reasons for discharge: Treatment goals met (planned discharge home from CIR)   Patient/Family Agrees with Progress Made and Goals Achieved: Yes   See FIM for current functional status  Pablo Lawrence 12/31/2012, 3:43 PM  Angela Nevin, MA, CCC-SLP [facility] Speech-Language Pathologist

## 2012-12-31 NOTE — Discharge Summary (Signed)
Dennis Dennis Wyatt, Dennis Wyatt               ACCOUNT NO.:  [id number]  MEDICAL RECORD NO.:  [id number]  LOCATION:  4027                         FACILITY:  [facility]  PHYSICIAN:  Dennis Dennis Wyatt, WyattDATE OF BIRTH:  05-Jun-1961  DATE OF ADMISSION:  12/17/2012 DATE OF DISCHARGE:  01/01/2013                              DISCHARGE SUMMARY   DISCHARGE DIAGNOSES: 1. Right subarachnoid hemorrhage due to anterior communicating     aneurysm status post coiling with  inferior vena cava drain. 2. Sequential compression devices for deep venous thrombosis     prophylaxis, pain management. 3. Mood with delirium-improved. 4. Hyponatremia, resolved. 5. Hypertension. 6. Right peroneal popliteal vein deep venous thrombosis.  HISTORY OF PRESENT ILLNESS:  This is a 52 year old right-handed male with history of hypertension, who was admitted on Dec 03, 2012, with headache and dizziness.  Blood pressure noted to be 204/123.  Cranial CT scan showed subarachnoid hemorrhage with concern for ruptured aneurysm. Four-vessel cerebral arteriogram showed 11 mm x 7 mm irregular right ACA A1 aneurysm.  Interventional Radiology consulted, the patient underwent coiling of aneurysm with IVC drain.  The patient did remain intubated for a time followed by Critical Care Medicine.  Latest followup cranial CT scan on December 13, 2012, stable with external ventricular drain removed. Close blood pressure monitoring with taper of Nimotop.  He was tolerating a regular diet.  Patient with bouts of hyponatremia placed on Declomycin as well as sodium chloride tablets with latest sodium level is 135.  Bouts of delirium confusion that  continued to improve. Physical occupational therapy ongoing, patient was admitted for comprehensive rehab program.  PAST MEDICAL HISTORY:  See discharge diagnoses.  SOCIAL HISTORY:  Lives alone with family support.  Functional history prior to admission was independent.  Working full time.  Functional status  upon admission to rehab services was +2 total assist to ambulate 90 feet to person handheld assistance.  PHYSICAL EXAMINATION:  VITAL SIGNS:  Blood pressure 177/77, pulse 58, temperature 97, and respirations 18. GENERAL:  This was an Wyatt male.  Prior drain sites were closed with no drainage. EYES:  Pupils round and reactive to light. NEURO:  The patient was able to provide his name and date of birth.  He did state he was in [place].  Limited awareness of his deficits.  He did follow simple commands.  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  He needs cues to maintain attention. LUNGS:  Clear to auscultation. CARDIAC:  Regular rate and rhythm. ABDOMEN:  Soft and nontender.  Good bowel sounds.  REHABILITATION HOSPITAL COURSE:  The patient was admitted to Inpatient Rehab Services with therapies initiated on a 3-hour daily basis consisting of physical therapy, occupational therapy, speech therapy, and rehabilitation nursing.  The following issues were addressed during the patient's rehabilitation stay.  Pertaining to [name]' subarachnoid hemorrhage, ACom aneurysm, he had undergone coiling, and IVC drain remained stable.  He would follow up with Neurosurgery. Sequential compression devices were in place for DVT prophylaxis. Workup for mild leukocytosis was unremarkable; however, during workup of venous Doppler study was completed of lower extremities that did show findings consistent with indeterminate age deep vein thrombosis involving  the right popliteal vein and right peroneal vein.  Contacts were made Interventional Radiology in regard to anticoagulation, which felt this could take place after recent coiling, thus the patient was placed on Xarelto secondary to DVT with no bleeding episodes noted.  His mood and delirium continued to improve.  He was placed on Ritalin to help focus better to attention and tasks with good results.  He was using Seroquel at night for sleep.   Bouts of headache with Topamax with good results.  His blood pressures remained well controlled.  He was tapered off of his Nimotop.  Hyponatremia had resolved with latest sodium levels of 145.  The patient received weekly collaborative interdisciplinary team conferences to discuss estimated length of stay, family teaching, and any barriers to his discharge.  He had occasional incontinence of bladder with routine toileting, minimal assist, close supervision overall for activities of daily living with some decreased awareness and confusion being impulsive.  Minimal assist supervision for his mobility.  The patient was to discharge to his brother's home with needs to provide 24-hour care with full family teaching completed.  DISCHARGE MEDICATIONS:  Ritalin 5 mg p.o. b.i.d., NicoDerm patch taper as directed, Nimotop 60 mg every 8 hours with taper as advised, oxycodone 10 mg every 4 hours as needed for moderate pain, Seroquel 25 mg p.o. at bedtime, Xarelto 50 mg p.o. b.i.d., and Topamax  50 mg p.o. at bedtime.  DIET:  Regular.  SPECIAL INSTRUCTIONS:  The patient would follow up with Dr. Faith Wyatt at the outpatient rehab service office on February 07, 2013 and Dr. Marikay Wyatt, Neurosurgery call for appointment.     Dennis Dennis Wyatt, P.A.   ______________________________ Dennis Dennis Wyatt, M.D.    DA/MEDQ  D:  12/31/2012  T:  12/31/2012  Job:  [id number]  cc:   Dennis Dennis Wyatt, M.D. Dennis Alert, MD

## 2012-12-31 NOTE — Progress Notes (Signed)
Physical Therapy Session Note  Patient Details  Name: Dennis Wyatt MRN: [id number] Date of Birth: May 21, 1961  Today's Date: 12/31/2012 Time: 1030-1130 Time Calculation (min): 60 min  Short Term Goals: Week 2:  PT Short Term Goal 1 (Week 2): = unmet long term goals  Skilled Therapeutic Interventions/Progress Updates:    Session focused on family education for niece. Ambulation over unit with niece providing close supervision, directional and safety cues. Floor transfer x 2, once with PT providing most cues, second attempt with niece providing min assist and verbal cues via teach back/demonstration. Car transfer with niece providing supervision cues. Stairs x7 with hand hold assist and wall on one side to simulate home entry. Discussed management of RW with niece (moving RW up/down steps prior to pt performing steps for increased safety). PT continuing to recommend railings, niece reports family working on installing them. Re-iterated importance of 24/7 care including monitoring during night via baby monitors or actually having someone in the room with pt, niece reports understanding. Stressed pt's risk for falls and need for close supervision when mobilizing, niece able to teach back education.   Therapy Documentation Precautions:  Precautions Precautions: Fall Precaution Comments: confused, disoriented Restrictions Weight Bearing Restrictions: No Pain: No c/o pain at start of treatment  See FIM for current functional status  Therapy/Group: Individual Therapy  Wilhemina Bonito 12/31/2012, 11:42 AM

## 2012-12-31 NOTE — Progress Notes (Signed)
Occupational Therapy Session Note  Patient Details  Name: Dennis Wyatt MRN: [id number] Date of Birth: 1961-03-16  Today's Date: 12/31/2012 Time: 0800-0900 Time Calculation (min): 60 min  Short Term Goals: Week 2:  OT Short Term Goal 1 (Week 2): pt will identify need to void and wait for assistance to arrive OT Short Term Goal 2 (Week 2): patient will perform toilet transfer with supervision OT Short Term Goal 3 (Week 2): pt will perform toileting tasks with supervison  Skilled Therapeutic Interventions/Progress Updates:    Pt in bed upon arrival and greeted therapist but required max verbal cues for orientation.  Pt stated that he remembered he was in the hospital because he had some bleeding on his brain but continued to speak about occurrences yesterday afternoon at his place of work and at home.  Pt was adamant that he was at other places yesterday afternoon.  Pt agreeable to bathing at shower level and amb with RW to bathroom to use toilet before transferring to shower.  Pt continues to require max verbal cues for task initiation sequencing and occasional motor planning/problem solving for unfamiliar tasks.  Pt's niece was present to assist and required occasional verbal cues to provide appropriate level of assistance.  Pt's niece provided appropriate verbal cues throughout session.  Focus on safety awareness, functional amb in home environment, and continued education.  Therapy Documentation Precautions:  Precautions Precautions: Fall Precaution Comments: confused, disoriented, light headed Restrictions Weight Bearing Restrictions: No Pain: Pain Assessment Pain Score:   3 Pain Type: Acute pain Pain Location: Head Pain Orientation: Anterior Pain Descriptors / Indicators: Headache Pain Onset: On-going Pain Intervention(s): RN made aware ADL: ADL Eating: Independent Grooming: Supervision/safety Upper Body Bathing: Supervision/safety Lower Body Bathing:  Supervision/safety Upper Body Dressing: Supervision/safety Lower Body Dressing: Supervision/safety Toileting: Supervision/safety Toilet Transfer: Close supervision Tub/Shower Transfer: Close supervison  See FIM for current functional status  Therapy/Group: Individual Therapy  Rydan, Gulyas 12/31/2012, 9:05 AM

## 2012-12-31 NOTE — Progress Notes (Signed)
Physical Therapy Discharge Summary  Patient Details  Name: Dennis Wyatt MRN: [id number] Date of Birth: 11-Nov-1960  Today's Date: 12/31/2012 Time: 5284-1324 Time Calculation (min): 36 min  1:1 session focused on finalizing family education for pt's niece. Niece led session in getting pt out of bed, shoes donned, and ambulation to the therapy gym with RW without need for cues from therapist. Stairs practiced again without railing, niece providing hand hold assist and verbal cues. Bathroom mobility practiced with niece providing close supervision as pt left RW behind several times, niece appropriately cuing pt for safety. PT did recommend that niece keep her proximity to pt closer to decrease risk for falls. Reiterated need for pt to have 24 hour supervision including when in bathroom. Niece verbalized understanding. Demonstrated wheelchair breakdown for transportation. Discussed need to fully educated on pt's risk for falls with any one who is to stay with the pt if niece is to run an errand, etc. Educated niece on skin breakdown and when to contact health care providers if noticed, educated on pressure relief. Niece verbalized understanding through teach back.    Patient has met 10 of 10 long term goals due to improved activity tolerance, improved balance, improved postural control, ability to compensate for deficits, improved attention, improved awareness and improved coordination. Pt continues to be limited most by cognitive deficits and decreased safety, emergent awareness which exponentially increase pt's risk for falls when unsupervised. Patient to discharge at an ambulatory level full Supervision.   Patient's care partner is independent to provide the necessary physical and cognitive assistance at discharge.  Reasons goals not met: Pt met all of goals, with railing on one side pt can perform steps with supervision however family taught min assist through hand hold assist as pt does not have railing  at home. Niece reports her father is adding railing soon.   Recommendation:  Patient will benefit from ongoing skilled PT services in outpatient setting to continue to advance safe functional mobility, address ongoing impairments in attention, generalized awareness, safety awareness, dynamic balance, endurance, and minimize fall risk.  Equipment: RW, wheelchair  Reasons for discharge: treatment goals met and discharge from hospital  Patient/family agrees with progress made and goals achieved: Yes  PT Discharge Precautions/Restrictions Precautions Precautions: Fall Restrictions Weight Bearing Restrictions: No Vital Signs Therapy Vitals Temp: 97 F (36.1 C) Temp src: Oral Pulse Rate: 89 Resp: 20 BP: 125/59 mmHg Patient Position, if appropriate: Sitting Oxygen Therapy SpO2: 99 % O2 Device: None (Room air) Pain Pain Assessment Pain Assessment: No/denies pain  Cognition Overall Cognitive Status: Impaired/Different from baseline Arousal/Alertness: Awake/alert Orientation Level: Oriented X4 Attention: Sustained Focused Attention: Appears intact Focused Attention Impairment: Verbal basic;Functional basic Sustained Attention: Impaired Sustained Attention Impairment: Verbal basic;Functional basic Selective Attention: Impaired Selective Attention Impairment: Verbal basic;Functional basic Memory: Impaired Memory Impairment: Storage deficit;Retrieval deficit;Decreased recall of new information;Decreased long term memory;Decreased short term memory Decreased Short Term Memory: Verbal basic;Functional basic Awareness: Impaired Awareness Impairment: Intellectual impairment Problem Solving: Impaired Problem Solving Impairment: Verbal basic;Functional basic Reasoning: Impaired Sequencing: Impaired Sequencing Impairment: Verbal basic;Functional basic Organizing: Impaired Organizing Impairment: Verbal basic;Functional basic Decision Making: Impaired Decision Making Impairment:  Verbal basic;Functional basic Initiating: Impaired Initiating Impairment: Functional basic Self Monitoring: Impaired Behaviors: Confabulation;Impulsive Safety/Judgment: Impaired Sensation Sensation Light Touch: Appears Intact Proprioception: Appears Intact Coordination Gross Motor Movements are Fluid and Coordinated: No Fine Motor Movements are Fluid and Coordinated: Yes  Mobility Bed Mobility Rolling Right: 4: Min assist;5: Supervision Rolling Left: 4: Min assist Right Sidelying to  Sit: 5: Supervision Supine to Sit: 5: Supervision Sit to Supine: HOB flat;5: Supervision Transfers Sit to Stand: 5: Supervision;From bed;From chair/3-in-1;From toilet Sit to Stand Details (indicate cue type and reason): Supervision for safety  Stand to Sit: With upper extremity assist;To bed;5: Supervision;To chair/3-in-1;To toilet Stand to Sit Details: cues for safety throughout, pt does not always square with support surface prior to sitting and tends to leave RW behind.  Stand Pivot Transfers: 5: Supervision Locomotion  Ambulation Ambulation: Yes Ambulation/Gait Assistance: 5: Supervision Ambulation Distance (Feet): 300 Feet Assistive device: Rolling walker Ambulation/Gait Assistance Details: Cues throuhgout for direction, safety, posture, and RW management. Some cues needed for obstacle negotiation.  Gait Gait: Yes Gait Pattern: Impaired Gait Pattern: Step-through pattern;Decreased stride length;Trunk flexed (decreased foot clearance bilaterailly, Rt>impaired than Lt.) Stairs / Additional Locomotion Stairs: Yes Stairs Assistance: 4: Min assist Stairs Assistance Details: Tactile cues for sequencing;Tactile cues for placement;Tactile cues for posture;Verbal cues for technique;Verbal cues for precautions/safety;Verbal cues for safe use of DME/AE;Verbal cues for sequencing;Verbal cues for gait pattern;Visual cues/gestures for sequencing;Visual cues for safe use of DME/AE Stair Management  Technique:  (one HHA, one flat wall to simulated home entry) Number of Stairs: 7 Wheelchair Mobility Wheelchair Mobility: Yes Wheelchair Assistance: 5: Supervision Wheelchair Assistance Details:  (cues for continuation and attention to task)  Trunk/Postural Assessment  Cervical Assessment Cervical Assessment: Within Functional Limits Thoracic Assessment Thoracic Assessment: Within Functional Limits Lumbar Assessment Lumbar Assessment: Within Functional Limits Postural Control Postural Control: Within Functional Limits  Balance Static Sitting Balance Static Sitting - Balance Support: Feet supported Static Sitting - Level of Assistance: 5: Stand by assistance Dynamic Sitting Balance Dynamic Sitting - Balance Support: Feet supported Dynamic Sitting - Level of Assistance: 5: Stand by assistance Static Standing Balance Static Standing - Balance Support: During functional activity;No upper extremity supported Static Standing - Level of Assistance: 5: Stand by assistance Dynamic Standing Balance Dynamic Standing - Balance Support: No upper extremity supported Dynamic Standing - Level of Assistance: 5: Stand by assistance Dynamic Standing - Balance Activities: Lateral lean/weight shifting;Forward lean/weight shifting;Reaching for objects;Reaching across midline Extremity Assessment      RLE Assessment RLE Assessment: Within Functional Limits LLE Assessment LLE Assessment: Within Functional Limits  See FIM for current functional status  Wilhemina Bonito 12/31/2012, 3:44 PM

## 2012-12-31 NOTE — Progress Notes (Signed)
Speech Language Pathology Daily Session Note  Patient Details  Name: Dennis Wyatt MRN: [id number] Date of Birth: [birth year]  Today's Date: 12/31/2012 Time: 2952-8413 Time Calculation (min): 45 min  Short Term Goals: Week 2: SLP Short Term Goal 1 (Week 2): Patient will demonstrate selective attention to familiar task for 10 minutes with Mod assist vebral cues for re-direction. SLP Short Term Goal 1 - Progress (Week 2): Partly met SLP Short Term Goal 2 (Week 2): Patient will verbalize 3 deficits that impact his ability to safely complete basic ADLs with Mod question cues. SLP Short Term Goal 3 (Week 2): Patient will demonstrate basic problem solving with Min assist clinician cues. SLP Short Term Goal 3 - Progress (Week 2): Partly met SLP Short Term Goal 4 (Week 2): Patient will utilize word finding strategies during structured tasks with Mod assist verbal cues. SLP Short Term Goal 4 - Progress (Week 2): Met  Skilled Therapeutic Interventions: Patient seen to address cognitive function goals, as well as patient/family education with patient's neice present,secondary to planned discharge home tomorrow, with neice providing supervision and assistance as needed. Patient completed problem solving task with counting money and selective and alternating attention task with check writing task. Patient improved with his accuracy with both tasks with benefit of verbal  instruction, repeated task trials, demonstration of task by clinician, and visual cues. Clinician provided neice with education, training, and materials for use with patient at home.     FIM:  Comprehension Comprehension Mode: Auditory Comprehension: 4-Understands basic 75 - 89% of the time/requires cueing 10 - 24% of the time Expression Expression Mode: Verbal Expression: 4-Expresses basic 75 - 89% of the time/requires cueing 10 - 24% of the time. Needs helper to occlude trach/needs to repeat words. Social Interaction Social  Interaction: 4-Interacts appropriately 75 - 89% of the time - Needs redirection for appropriate language or to initiate interaction. Problem Solving Problem Solving: 3-Solves basic 50 - 74% of the time/requires cueing 25 - 49% of the time Memory Memory: 2-Recognizes or recalls 25 - 49% of the time/requires cueing 51 - 75% of the time  Pain    Therapy/Group: Individual Therapy  Pablo Lawrence 12/31/2012, 3:17 PM  Angela Nevin, MA, CCC-SLP [facility] Speech-Language Pathologist

## 2012-12-31 NOTE — Progress Notes (Signed)
Occupational Therapy Discharge Summary  Patient Details  Name: Keyshun Elpers MRN: [id number] Date of Birth: 1961/07/14  Today's Date: 12/31/2012  Patient has met 12 of 14 long term goals due to improved activity tolerance, improved balance, improved attention and improved coordination. Pt made steady progress with bathing, dressing, toilet transfers, toileting, and shower transfers during this admission.  Pt continues to require max verbal cues for orientation, intellectual awareness, task initiation, and sequencing.  Pt requires max verbal cues for problem solving with new task or in unfamiliar envirionment.  Pt's niece has been present for hands on therapy and assistance with BADLs but required min verbal cues to provide steady A when patient ambulating.  Recommended to niece that she provide steady A when patient walks and she verbalized understanding.  Pt's niece provides appropriate verbal cues and redirection.   Patient to discharge at overall Supervision level.  Patient's care partner, neice is independent to provide the necessary physical and cognitive assistance at discharge.    Reasons goals not met:Pt did not meet 2 of his cognition goals due to pt still requiring mod A to max A for selective attention and emergent awareness to remain safe with functional mobility and ADL tasks.  Recommendation:  Patient will benefit from ongoing skilled OT services in outpatient setting to continue to advance functional skills in the area of BADL and Reduce care partner burden.  Equipment: Tub bench and BSC  Reasons for discharge: treatment goals met and discharge from hospital  Patient/family agrees with progress made and goals achieved: Yes  OT Discharge Pain Pain Assessment Pain Score:   3 ADL ADL Eating: Independent Grooming: Supervision/safety Upper Body Bathing: Supervision/safety Lower Body Bathing: Supervision/safety Upper Body Dressing: Supervision/safety Lower Body Dressing:  Supervision/safety Toileting: Supervision/safety Toilet Transfer: Close supervision Tub/Shower Transfer: Close supervison Vision/Perception  Vision - History Baseline Vision: Wears glasses all the time Patient Visual Report: No change from baseline Vision - Assessment Eye Alignment: Within Functional Limits Vision Assessment: Vision not tested Perception Perception: Within Functional Limits Praxis Praxis: Intact  Cognition Overall Cognitive Status: Impaired/Different from baseline Arousal/Alertness: Awake/alert Orientation Level: Oriented X4 Attention: Sustained Focused Attention: Appears intact Focused Attention Impairment: Verbal basic;Functional basic Sustained Attention: Impaired Sustained Attention Impairment: Verbal basic;Functional basic Selective Attention: Impaired Selective Attention Impairment: Verbal basic;Functional basic Memory: Impaired Memory Impairment: Storage deficit;Retrieval deficit;Decreased recall of new information;Decreased long term memory;Decreased short term memory Decreased Short Term Memory: Verbal basic;Functional basic Awareness: Impaired Awareness Impairment: Intellectual impairment Problem Solving: Impaired Problem Solving Impairment: Verbal basic;Functional basic Sequencing: Impaired Sequencing Impairment: Verbal basic;Functional basic Organizing: Impaired Organizing Impairment: Verbal basic;Functional basic Sensation Sensation Light Touch: Appears Intact Stereognosis: Appears Intact Hot/Cold: Appears Intact Proprioception: Appears Intact    Trunk/Postural Assessment  Cervical Assessment Cervical Assessment: Within Functional Limits Thoracic Assessment Thoracic Assessment: Within Functional Limits Lumbar Assessment Lumbar Assessment: Within Functional Limits Postural Control Postural Control: Within Functional Limits  Balance Static Sitting Balance Static Sitting - Balance Support: Feet supported Static Sitting - Level of  Assistance: 5: Stand by assistance Dynamic Sitting Balance Dynamic Sitting - Balance Support: Feet supported Dynamic Sitting - Level of Assistance: 5: Stand by assistance Extremity/Trunk Assessment RUE Assessment RUE Assessment: Within Functional Limits LUE Assessment LUE Assessment: Within Functional Limits  See FIM for current functional status  Lauris, Serviss 12/31/2012, 6:57 AM

## 2013-01-01 NOTE — Progress Notes (Signed)
Patient ID: Dennis Wyatt, male   DOB: August 06, 1960, 52 y.o.   MRN: [id number] Patient ID: Dennis Wyatt, male   DOB: July 15, 1960, 52 y.o.   MRN: [id number]  6/21.  Uneventful night; stable and ready for d/c.  Patient Vitals for the past 24 hrs:  BP Temp Temp src Pulse Resp SpO2  12/31/12 1407 125/59 mmHg 97 F (36.1 C) Oral 89 20 99 %     Intake/Output Summary (Last 24 hours) at 01/01/13 0824 Last data filed at 01/01/13 0816  Gross per 24 hour  Intake    240 ml  Output    300 ml  Net    -60 ml    HEENT- neg Chest-clear CV- regular no tachy Abd- benign Extr- no edema  Asses- stable s/p SAH  Plan- stable for d/c today   Assessment/Plan: 1. Functional deficits secondary to [facility] due to ruptured ACOM aneurysm which require 3+ hours per day of interdisciplinary therapy in a comprehensive inpatient rehab setting.  Rogelia Boga 01/01/2013, 8:23 AM

## 2013-01-03 ENCOUNTER — Ambulatory Visit: Payer: No Typology Code available for payment source | Admitting: Physical Therapy

## 2013-01-03 NOTE — Progress Notes (Signed)
Recreational Therapy Discharge Summary Patient Details  Name: Dennis Wyatt MRN: [id number] Date of Birth: 12-13-1960 Today's Date: 01/03/2013  Long term goals set: 1  Long term goals met: 1  Comments on progress toward goals: Pt met supervision level for simple TR tasks given cues for safety.  Pt discharged home with family to provide 24 hour supervision/assist.  Pt's niece present and participatory in therapy sessions including hands on assist and cuing pt for safe task completion. Reasons for discharge: discharge from hospital Patient/family agrees with progress made and goals achieved: Yes  [name] 01/03/2013, 11:25 AM

## 2013-01-04 ENCOUNTER — Ambulatory Visit
Payer: No Typology Code available for payment source | Attending: Physical Medicine & Rehabilitation | Admitting: Occupational Therapy

## 2013-01-04 ENCOUNTER — Ambulatory Visit: Payer: No Typology Code available for payment source | Admitting: Physical Therapy

## 2013-01-04 DIAGNOSIS — Z5189 Encounter for other specified aftercare: Secondary | ICD-10-CM | POA: Insufficient documentation

## 2013-01-04 DIAGNOSIS — R269 Unspecified abnormalities of gait and mobility: Secondary | ICD-10-CM | POA: Insufficient documentation

## 2013-01-04 DIAGNOSIS — R41841 Cognitive communication deficit: Secondary | ICD-10-CM | POA: Insufficient documentation

## 2013-01-04 DIAGNOSIS — R5381 Other malaise: Secondary | ICD-10-CM | POA: Insufficient documentation

## 2013-01-04 DIAGNOSIS — R4189 Other symptoms and signs involving cognitive functions and awareness: Secondary | ICD-10-CM | POA: Insufficient documentation

## 2013-01-04 DIAGNOSIS — M6281 Muscle weakness (generalized): Secondary | ICD-10-CM | POA: Insufficient documentation

## 2013-01-04 DIAGNOSIS — M2550 Pain in unspecified joint: Secondary | ICD-10-CM | POA: Insufficient documentation

## 2013-01-05 ENCOUNTER — Ambulatory Visit: Payer: No Typology Code available for payment source | Admitting: Occupational Therapy

## 2013-01-05 ENCOUNTER — Ambulatory Visit: Payer: No Typology Code available for payment source | Admitting: Physical Therapy

## 2013-01-05 ENCOUNTER — Ambulatory Visit: Payer: No Typology Code available for payment source | Admitting: Speech Pathology

## 2013-01-12 ENCOUNTER — Telehealth: Payer: Self-pay | Admitting: Physical Medicine & Rehabilitation

## 2013-01-12 NOTE — Telephone Encounter (Signed)
Patient having trouble sleeping, patient needs something else for sleep Seroquel not helping.  Please call

## 2013-01-13 NOTE — Telephone Encounter (Signed)
Patient is aware Dr Riley Kill is out of the office.  He is going to try otc tylenol PM.  Advised Mrs Beehler she can contact pcp or go to urgent care if worsens.  Will forward message to Dr Riley Kill to address when he returns to office.

## 2013-01-17 ENCOUNTER — Telehealth: Payer: Self-pay

## 2013-01-17 NOTE — Telephone Encounter (Signed)
Patients niece called requesting something for patient to help him sleep.  Tylenol PM is not helping.  He is actually getting up in the middle of the night and going for a walk.  She was wondering if he can take zquil.  She says he has had this in the past and it helped.  Advised her to speak with pharmacist, She is going to ask the pharmacy if this is ok.

## 2013-01-17 NOTE — Telephone Encounter (Signed)
Patient called to get work status.

## 2013-01-18 ENCOUNTER — Ambulatory Visit: Payer: No Typology Code available for payment source | Admitting: Occupational Therapy

## 2013-01-18 ENCOUNTER — Ambulatory Visit: Payer: No Typology Code available for payment source | Attending: Physical Medicine & Rehabilitation

## 2013-01-18 ENCOUNTER — Ambulatory Visit: Payer: No Typology Code available for payment source | Admitting: Physical Therapy

## 2013-01-18 ENCOUNTER — Encounter: Payer: No Typology Code available for payment source | Admitting: Occupational Therapy

## 2013-01-18 DIAGNOSIS — R269 Unspecified abnormalities of gait and mobility: Secondary | ICD-10-CM | POA: Insufficient documentation

## 2013-01-18 DIAGNOSIS — R5381 Other malaise: Secondary | ICD-10-CM | POA: Insufficient documentation

## 2013-01-18 DIAGNOSIS — Z5189 Encounter for other specified aftercare: Secondary | ICD-10-CM | POA: Insufficient documentation

## 2013-01-18 DIAGNOSIS — M6281 Muscle weakness (generalized): Secondary | ICD-10-CM | POA: Insufficient documentation

## 2013-01-18 DIAGNOSIS — R41841 Cognitive communication deficit: Secondary | ICD-10-CM | POA: Insufficient documentation

## 2013-01-18 DIAGNOSIS — R4189 Other symptoms and signs involving cognitive functions and awareness: Secondary | ICD-10-CM | POA: Insufficient documentation

## 2013-01-18 DIAGNOSIS — M2550 Pain in unspecified joint: Secondary | ICD-10-CM | POA: Insufficient documentation

## 2013-01-18 NOTE — Telephone Encounter (Signed)
Patient aware he needs to be seen to address work status.

## 2013-01-20 ENCOUNTER — Ambulatory Visit: Payer: No Typology Code available for payment source | Admitting: Physical Therapy

## 2013-01-20 ENCOUNTER — Ambulatory Visit: Payer: No Typology Code available for payment source

## 2013-01-20 ENCOUNTER — Ambulatory Visit: Payer: No Typology Code available for payment source | Admitting: Occupational Therapy

## 2013-01-25 ENCOUNTER — Ambulatory Visit: Payer: No Typology Code available for payment source | Admitting: Occupational Therapy

## 2013-01-25 ENCOUNTER — Ambulatory Visit: Payer: No Typology Code available for payment source | Admitting: Physical Therapy

## 2013-01-25 ENCOUNTER — Ambulatory Visit: Payer: No Typology Code available for payment source

## 2013-01-27 ENCOUNTER — Ambulatory Visit: Payer: No Typology Code available for payment source

## 2013-01-27 ENCOUNTER — Ambulatory Visit: Payer: No Typology Code available for payment source | Admitting: Occupational Therapy

## 2013-01-27 ENCOUNTER — Ambulatory Visit: Payer: No Typology Code available for payment source | Admitting: Physical Therapy

## 2013-02-01 ENCOUNTER — Ambulatory Visit: Payer: No Typology Code available for payment source | Admitting: Physical Therapy

## 2013-02-01 ENCOUNTER — Ambulatory Visit: Payer: No Typology Code available for payment source | Admitting: Occupational Therapy

## 2013-02-04 ENCOUNTER — Ambulatory Visit: Payer: No Typology Code available for payment source

## 2013-02-04 ENCOUNTER — Ambulatory Visit: Payer: No Typology Code available for payment source | Admitting: Occupational Therapy

## 2013-02-04 ENCOUNTER — Ambulatory Visit: Payer: No Typology Code available for payment source | Admitting: Physical Therapy

## 2013-02-07 ENCOUNTER — Encounter
Payer: No Typology Code available for payment source | Attending: Physical Medicine & Rehabilitation | Admitting: Physical Medicine & Rehabilitation

## 2013-02-07 ENCOUNTER — Encounter: Payer: Self-pay | Admitting: Physical Medicine & Rehabilitation

## 2013-02-07 VITALS — BP 151/79 | HR 100 | Resp 16 | Ht 72.0 in | Wt 222.0 lb

## 2013-02-07 DIAGNOSIS — I10 Essential (primary) hypertension: Secondary | ICD-10-CM | POA: Insufficient documentation

## 2013-02-07 DIAGNOSIS — I609 Nontraumatic subarachnoid hemorrhage, unspecified: Secondary | ICD-10-CM | POA: Insufficient documentation

## 2013-02-07 DIAGNOSIS — G4700 Insomnia, unspecified: Secondary | ICD-10-CM | POA: Insufficient documentation

## 2013-02-07 DIAGNOSIS — R51 Headache: Secondary | ICD-10-CM | POA: Insufficient documentation

## 2013-02-07 DIAGNOSIS — F17200 Nicotine dependence, unspecified, uncomplicated: Secondary | ICD-10-CM | POA: Insufficient documentation

## 2013-02-07 DIAGNOSIS — R519 Headache, unspecified: Secondary | ICD-10-CM | POA: Insufficient documentation

## 2013-02-07 MED ORDER — TRAZODONE HCL 50 MG PO TABS: 50.0000 mg | ORAL_TABLET | Freq: Every day | ORAL | Status: DC

## 2013-02-07 MED ORDER — METHYLPHENIDATE HCL ER (LA) 20 MG PO CP24: 20.0000 mg | ORAL_CAPSULE | ORAL | Status: DC

## 2013-02-07 MED ORDER — TRAMADOL HCL 50 MG PO TABS: 50.0000 mg | ORAL_TABLET | Freq: Three times a day (TID) | ORAL | Status: DC | PRN

## 2013-02-07 NOTE — Progress Notes (Signed)
Subjective:    Patient ID: Dennis Wyatt, male    DOB: 28-Sep-1960, 52 y.o.   MRN: [id number]  HPI  Dennis Wyatt is back regarding his SAH. He has been home for about a month. The topamax was stopped as he ran out and family notes his mentation improved afterwards. He currently is taking tylenol which doesn't do much.   He currently is taking his ritalin, xarelto, lisinopril.   His daughter feels that the ritalin is not working as well as it once was. Pt notes that he becomes distracted fairly easily. He had problems with a 54 yo puzzle the other day as an example.   From a sleep standpoint he is still having some difficulties. He is taking z-quel for sleep and it can be "hit or miss."   He is still smoking. He wants to stop but is having problems cutting back.   From a balance standpoint, he still has some issues with higher level balance. He furniture walks at home and uses a cane outside the house.  He reports some dizziness when he's up and about.   Pain Inventory Average Pain na Pain Right Now 3 My pain is burning  In the last 24 hours, has pain interfered with the following? General activity 3 Relation with others 0 Enjoyment of life 0 What TIME of day is your pain at its worst? daytime Sleep (in general) Fair  Pain is worse with: standing and some activites Pain improves with: rest and medication Relief from Meds: 5  Mobility use a cane how many minutes can you walk? 10-15 ability to climb steps?  yes do you drive?  no  Function not employed: date last employed 12-03-12 I need assistance with the following:  household duties and shopping  Neuro/Psych weakness numbness tingling dizziness anxiety  Prior Studies Any changes since last visit?  yes CT/MRI  Physicians involved in your care Any changes since last visit?  no   History reviewed. No pertinent family history. History   Social History  . Marital Status: Divorced    Spouse Name: N/A    Number of  Children: N/A  . Years of Education: N/A   Social History Main Topics  . Smoking status: Current Every Day Smoker -- 1.50 packs/day  . Smokeless tobacco: None  . Alcohol Use: Yes  . Drug Use: No  . Sexually Active: None   Other Topics Concern  . None   Social History Narrative  . None   Past Surgical History  Procedure Laterality Date  . Radiology with anesthesia N/A 12/03/2012    Procedure: RADIOLOGY WITH ANESTHESIA;  Surgeon: Oneal Grout, MD;  Location: [facility] OR;  Service: Radiology;  Laterality: N/A;   Past Medical History  Diagnosis Date  . Hypertension    BP 151/79  Pulse 100  Resp 16  Ht 6' (1.829 m)  Wt 222 lb (100.699 kg)  BMI 30.1 kg/m2  SpO2 94%     Review of Systems  Neurological: Positive for dizziness, weakness and numbness.       Tingling  Psychiatric/Behavioral: Positive for agitation.  All other systems reviewed and are negative.       Objective:   Physical Exam  General: Alert and oriented x 3, No apparent distress HEENT: Head is normocephalic, atraumatic, PERRLA, EOMI, sclera anicteric, oral mucosa pink and moist, dentition intact, ext ear canals clear,  Neck: Supple without JVD or lymphadenopathy Heart: Reg rate and rhythm. No murmurs rubs or gallops Chest: CTA bilaterally  without wheezes, rales, or rhonchi; no distress Abdomen: Soft, non-tender, non-distended, bowel sounds positive. Extremities: No clubbing, cyanosis, or edema. Pulses are 2+ Skin: Clean and intact without signs of breakdown Neuro: Pt was alert and generally appropriate. Oriented to date, place, time. Did fairly well with organization but had difficulty with more complex tasks. Had difficulty with numerical sequencing. Remembered 2/3 words after 5 minutes. Was able to spell the word "world" forwards and backwards. Strength near 5/5. Dennis Wyatt [facility] generally appropriate. CN non-focal. Walked with ER legs but no balance issues. Did have problems with heel to toe gait.   Musculoskeletal: Full ROM, No pain with AROM or PROM in the neck, trunk, or extremities. Posture appropriate Psych: Pt's affect is appropriate. Pt is cooperative            1. Functional deficits secondary to [facility] due to ruptured ACOM aneurysm  2. HTN 3. Headaches due to the above 4. Insomnia  Plan: 1. Increased ritalin to 20mg  qam XR form 2. Trazodone for sleep. Hold on the Z-quel for now 3. Reviewed smoking cessation and its importance 4. Tramadol for breakthrough headaches 5. No driving, no work. Family may leave him home alone. I believe he is safe for that. 6. 30 minutes of face to face patient care time were spent during this visit. All questions were encouraged and answered. I will follow up with him in one month.

## 2013-02-07 NOTE — Patient Instructions (Signed)
YOU MAY BE HOME ALONE  NO DRIVING. NO WORK

## 2013-02-08 ENCOUNTER — Ambulatory Visit: Payer: No Typology Code available for payment source | Admitting: Physical Therapy

## 2013-02-08 ENCOUNTER — Ambulatory Visit: Payer: No Typology Code available for payment source | Admitting: Occupational Therapy

## 2013-02-08 ENCOUNTER — Ambulatory Visit: Payer: No Typology Code available for payment source

## 2013-02-10 ENCOUNTER — Ambulatory Visit: Payer: No Typology Code available for payment source | Admitting: Occupational Therapy

## 2013-02-10 ENCOUNTER — Ambulatory Visit: Payer: No Typology Code available for payment source | Admitting: Physical Therapy

## 2013-02-10 ENCOUNTER — Ambulatory Visit: Payer: No Typology Code available for payment source

## 2013-02-15 ENCOUNTER — Ambulatory Visit: Payer: No Typology Code available for payment source

## 2013-02-15 ENCOUNTER — Ambulatory Visit: Payer: No Typology Code available for payment source | Admitting: Physical Therapy

## 2013-02-15 ENCOUNTER — Ambulatory Visit
Payer: No Typology Code available for payment source | Attending: Physical Medicine & Rehabilitation | Admitting: Occupational Therapy

## 2013-02-15 DIAGNOSIS — R269 Unspecified abnormalities of gait and mobility: Secondary | ICD-10-CM | POA: Insufficient documentation

## 2013-02-15 DIAGNOSIS — M6281 Muscle weakness (generalized): Secondary | ICD-10-CM | POA: Insufficient documentation

## 2013-02-15 DIAGNOSIS — R41841 Cognitive communication deficit: Secondary | ICD-10-CM | POA: Insufficient documentation

## 2013-02-15 DIAGNOSIS — R4189 Other symptoms and signs involving cognitive functions and awareness: Secondary | ICD-10-CM | POA: Insufficient documentation

## 2013-02-15 DIAGNOSIS — R5381 Other malaise: Secondary | ICD-10-CM | POA: Insufficient documentation

## 2013-02-15 DIAGNOSIS — M2550 Pain in unspecified joint: Secondary | ICD-10-CM | POA: Insufficient documentation

## 2013-02-15 DIAGNOSIS — Z5189 Encounter for other specified aftercare: Secondary | ICD-10-CM | POA: Insufficient documentation

## 2013-02-17 ENCOUNTER — Ambulatory Visit: Payer: No Typology Code available for payment source

## 2013-02-17 ENCOUNTER — Ambulatory Visit: Payer: No Typology Code available for payment source | Admitting: Occupational Therapy

## 2013-02-17 ENCOUNTER — Ambulatory Visit: Payer: No Typology Code available for payment source | Admitting: Physical Therapy

## 2013-02-22 ENCOUNTER — Ambulatory Visit: Payer: No Typology Code available for payment source | Admitting: Physical Therapy

## 2013-02-22 ENCOUNTER — Ambulatory Visit: Payer: No Typology Code available for payment source

## 2013-02-22 ENCOUNTER — Ambulatory Visit: Payer: No Typology Code available for payment source | Admitting: Occupational Therapy

## 2013-02-24 ENCOUNTER — Ambulatory Visit: Payer: No Typology Code available for payment source | Admitting: Physical Therapy

## 2013-02-24 ENCOUNTER — Ambulatory Visit: Payer: No Typology Code available for payment source

## 2013-02-24 ENCOUNTER — Ambulatory Visit: Payer: No Typology Code available for payment source | Admitting: Occupational Therapy

## 2013-02-28 ENCOUNTER — Ambulatory Visit: Payer: No Typology Code available for payment source

## 2013-03-02 ENCOUNTER — Ambulatory Visit: Payer: No Typology Code available for payment source

## 2013-03-09 ENCOUNTER — Encounter: Payer: Self-pay | Admitting: Physical Medicine & Rehabilitation

## 2013-03-09 ENCOUNTER — Telehealth: Payer: Self-pay

## 2013-03-09 ENCOUNTER — Encounter
Payer: No Typology Code available for payment source | Attending: Physical Medicine & Rehabilitation | Admitting: Physical Medicine & Rehabilitation

## 2013-03-09 ENCOUNTER — Ambulatory Visit: Payer: No Typology Code available for payment source

## 2013-03-09 VITALS — BP 135/70 | HR 90 | Resp 16 | Ht 72.0 in | Wt 231.0 lb

## 2013-03-09 DIAGNOSIS — I609 Nontraumatic subarachnoid hemorrhage, unspecified: Secondary | ICD-10-CM | POA: Insufficient documentation

## 2013-03-09 DIAGNOSIS — Z79899 Other long term (current) drug therapy: Secondary | ICD-10-CM | POA: Insufficient documentation

## 2013-03-09 DIAGNOSIS — R51 Headache: Secondary | ICD-10-CM | POA: Insufficient documentation

## 2013-03-09 DIAGNOSIS — I82409 Acute embolism and thrombosis of unspecified deep veins of unspecified lower extremity: Secondary | ICD-10-CM | POA: Insufficient documentation

## 2013-03-09 DIAGNOSIS — Z5181 Encounter for therapeutic drug level monitoring: Secondary | ICD-10-CM | POA: Insufficient documentation

## 2013-03-09 DIAGNOSIS — R519 Headache, unspecified: Secondary | ICD-10-CM

## 2013-03-09 DIAGNOSIS — G4700 Insomnia, unspecified: Secondary | ICD-10-CM | POA: Insufficient documentation

## 2013-03-09 MED ORDER — METHYLPHENIDATE HCL ER (LA) 20 MG PO CP24: 20.0000 mg | ORAL_CAPSULE | ORAL | Status: DC

## 2013-03-09 MED ORDER — RIVAROXABAN 15 MG PO TABS: 15.0000 mg | ORAL_TABLET | Freq: Two times a day (BID) | ORAL | Status: DC

## 2013-03-09 MED ORDER — TRAMADOL HCL 50 MG PO TABS: 50.0000 mg | ORAL_TABLET | Freq: Three times a day (TID) | ORAL | Status: DC | PRN

## 2013-03-09 NOTE — Telephone Encounter (Signed)
Patient wife called and says there is a problem with his prescriptions.

## 2013-03-09 NOTE — Progress Notes (Signed)
Subjective:    Patient ID: Dennis Wyatt, male    DOB: 1961/05/28, 52 y.o.   MRN: [id number]  HPI  Dennis Wyatt is back regarding his SAH. He generally is seeing improvement. He still notes some tingling in his hands. He is complaining of a pain between his shoulder blades. It bothers him frequently when he stands and bends over such as washes the dishes. His headaches are improving. He feels that they are more associated with mental tension than anything else.   Memory is improving although it's not back to baseline. The ritalin has helped his memory, focus, and attention. Balance is improving as well, but he sometimes feels that his "equilibrium" is off at times, which he thinks may be due to bp.   Sleep has been better except for last night when he was anticipating coming in here.   He works as a [employer] fast streamer and would like to get back to work.   Pain Inventory Average Pain 3 Pain Right Now 3 My pain is intermittent, sharp and stabbing  In the last 24 hours, has pain interfered with the following? General activity 6 Relation with others 6 Enjoyment of life 6 What TIME of day is your pain at its worst? varies Sleep (in general) Poor  Pain is worse with: walking, standing and some activites Pain improves with: rest, pacing activities and medication Relief from Meds: 5  Mobility walk without assistance how many minutes can you walk? 10 ability to climb steps?  yes do you drive?  no Do you have any goals in this area?  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  yes  Neuro/Psych bladder control problems bowel control problems numbness trouble walking confusion anxiety  Prior Studies Any changes since last visit?  no  Physicians involved in your care Any changes since last visit?  no   History reviewed. No pertinent family  history. History   Social History  . Marital Status: Divorced    Spouse Name: N/A    Number of Children: N/A  . Years of Education: N/A   Social History Main Topics  . Smoking status: Current Every Day Smoker -- 1.50 packs/day  . Smokeless tobacco: None  . Alcohol Use: Yes  . Drug Use: No  . Sexual Activity: None   Other Topics Concern  . None   Social History Narrative  . None   Past Surgical History  Procedure Laterality Date  . Radiology with anesthesia N/A 12/03/2012    Procedure: RADIOLOGY WITH ANESTHESIA;  Surgeon: Oneal Grout, MD;  Location: [facility] OR;  Service: Radiology;  Laterality: N/A;   Past Medical History  Diagnosis Date  . Hypertension    BP 135/70  Pulse 90  Resp 16  Ht 6' (1.829 m)  Wt 231 lb (104.781 kg)  BMI 31.32 kg/m2  SpO2 91%     Review of Systems  Gastrointestinal: Positive for diarrhea and constipation.  Genitourinary: Positive for difficulty urinating.  Musculoskeletal: Positive for back pain and gait problem.  Neurological: Positive for weakness, numbness and headaches.  Psychiatric/Behavioral: Positive for confusion. The patient is nervous/anxious.   All other systems reviewed and are negative.       Objective:   Physical Exam General: Alert and oriented x 3, No apparent distress  HEENT: Head is normocephalic, atraumatic, PERRLA, EOMI, sclera anicteric, oral mucosa pink and moist,  dentition intact, ext ear canals clear,  Neck: Supple without JVD or lymphadenopathy  Heart: Reg rate and rhythm. No murmurs rubs or gallops  Chest: CTA bilaterally without wheezes, rales, or rhonchi; no distress  Abdomen: Soft, non-tender, non-distended, bowel sounds positive.  Extremities: No clubbing, cyanosis, or edema. Pulses are 2+  Skin: Clean and intact without signs of breakdown  Neuro: Pt was alert and generally appropriate. Oriented to date, place, time. Processing, sequencing organization are better.    Still only remembered 2/3 words  after 5 minutes. Strength near 5/5. [facility] generally appropriate. CN non-focal. Walked with ER legs but no balance issues.   Musculoskeletal: Full ROM, No pain with AROM or PROM in the neck, trunk, or extremities. Posture appropriate. No myofascial trigger points palpated. He does have a bit of a head forward posture. Lipoma seen at the lower medial border of the left scapula. Psych: Pt's affect is appropriate. Pt is cooperative     1. Functional deficits secondary to [facility] due to ruptured ACOM aneurysm  2. HTN  3. Headaches due to the above  4. Insomnia  5. LE DVT 6. Myofasical shoulder girdle pain   Plan:  1. Continue ritalin at 20mg  qam XR form--refilled today.  2. Trazodone for sleep.    3. Will order neuropsych testing to gage his ability to return to work. I cannot see him returning to work before December or January given residual cognitive and balance deficits. 4. Discussed ROM, posture, heat, appropriate technique when he's standing,working. Take rest breaks as needed (would build them in on a scheduled basis) 5. No driving, no work.  6. 30 minutes of face to face patient care time were spent during this visit. All questions were encouraged and answered. I will follow up with him in one month.

## 2013-03-09 NOTE — Patient Instructions (Signed)
CALL ME WITH ANY PROBLEMS OR QUESTIONS (#[phone]).  HAVE A GOOD DAY  

## 2013-03-10 MED ORDER — METHYLPHENIDATE HCL 20 MG PO TABS: 20.0000 mg | ORAL_TABLET | Freq: Every day | ORAL | Status: DC

## 2013-03-10 NOTE — Telephone Encounter (Signed)
Patients wife says he was taking 20mg  xarelto not 15 so she needs that script fixed.  Also the ritalin capsules cost more than the tablets do so she request that we change back to tablets.  Ritalin script printed.  She is aware it will be ready Friday afternoon.  Please clarify xarelto.

## 2013-03-11 ENCOUNTER — Telehealth: Payer: Self-pay | Admitting: Physical Medicine & Rehabilitation

## 2013-03-11 MED ORDER — RIVAROXABAN 20 MG PO TABS: 20.0000 mg | ORAL_TABLET | Freq: Every day | ORAL | Status: DC

## 2013-03-11 NOTE — Telephone Encounter (Signed)
Dennis Wyatt wants to know when the prescription will be ready for pick up.  Please call when ready.

## 2013-03-11 NOTE — Telephone Encounter (Signed)
Left message for patient wife that script should be ready after 12.

## 2013-03-11 NOTE — Telephone Encounter (Signed)
Done

## 2013-03-15 MED ORDER — METHYLPHENIDATE HCL 10 MG PO TABS: 10.0000 mg | ORAL_TABLET | Freq: Two times a day (BID) | ORAL | Status: DC

## 2013-03-15 NOTE — Addendum Note (Signed)
Addended by: Faith Rogue T on: 03/15/2013 10:21 AM   Modules accepted: Orders

## 2013-03-16 ENCOUNTER — Other Ambulatory Visit: Payer: Self-pay

## 2013-03-16 NOTE — Telephone Encounter (Signed)
Ritalin rx changed to 10 bid.  Patient wife aware and will pick up script.

## 2013-03-17 ENCOUNTER — Telehealth: Payer: Self-pay

## 2013-03-17 DIAGNOSIS — R51 Headache: Secondary | ICD-10-CM

## 2013-03-17 DIAGNOSIS — I609 Nontraumatic subarachnoid hemorrhage, unspecified: Secondary | ICD-10-CM

## 2013-03-17 DIAGNOSIS — G4700 Insomnia, unspecified: Secondary | ICD-10-CM

## 2013-03-17 NOTE — Telephone Encounter (Signed)
Patient needs an order for a neurophsyc testing to see if he can return to work.  Order placed.

## 2013-04-07 ENCOUNTER — Telehealth: Payer: Self-pay | Admitting: Physical Medicine & Rehabilitation

## 2013-04-07 NOTE — Telephone Encounter (Signed)
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  Please call her.

## 2013-04-08 NOTE — Telephone Encounter (Signed)
Patients niece Crystal advised patient should not drive.

## 2013-04-08 NOTE — Telephone Encounter (Signed)
No he may not drive. I indicated that at his last visit

## 2013-04-28 ENCOUNTER — Telehealth: Payer: Self-pay

## 2013-04-28 DIAGNOSIS — I609 Nontraumatic subarachnoid hemorrhage, unspecified: Secondary | ICD-10-CM

## 2013-04-28 MED ORDER — METHYLPHENIDATE HCL 10 MG PO TABS: 10.0000 mg | ORAL_TABLET | Freq: Two times a day (BID) | ORAL | Status: DC

## 2013-04-28 NOTE — Telephone Encounter (Signed)
Patient requesting a refill of Ritalin. Rx printed for MD to sign. Will call patient when ready for pickup.

## 2013-04-29 ENCOUNTER — Telehealth: Payer: Self-pay | Admitting: Physical Medicine & Rehabilitation

## 2013-04-29 DIAGNOSIS — I609 Nontraumatic subarachnoid hemorrhage, unspecified: Secondary | ICD-10-CM

## 2013-04-29 DIAGNOSIS — G4700 Insomnia, unspecified: Secondary | ICD-10-CM

## 2013-04-29 MED ORDER — RIVAROXABAN 20 MG PO TABS: 20.0000 mg | ORAL_TABLET | Freq: Every day | ORAL | Status: DC

## 2013-04-29 MED ORDER — TRAMADOL HCL 50 MG PO TABS: 50.0000 mg | ORAL_TABLET | Freq: Three times a day (TID) | ORAL | Status: DC | PRN

## 2013-04-29 NOTE — Telephone Encounter (Signed)
Patient will be needing a refill on xarelto and ultram

## 2013-04-29 NOTE — Telephone Encounter (Signed)
Left message for Dennis Wyatt to return our call. I have refilled the tramadol but we do not refill xarelto.  PCP must refill

## 2013-04-29 NOTE — Telephone Encounter (Signed)
Because she said Dr Riley Kill has been filling the xarelto, I gave them one refill but instructed them to go to the PCP and get this established because we do not routinely fill blood thinners after discharge from hospital.

## 2013-04-29 NOTE — Telephone Encounter (Signed)
Called patient to make him aware his RX for Ritalin is ready for pickup.

## 2013-05-02 ENCOUNTER — Telehealth: Payer: Self-pay

## 2013-05-02 NOTE — Telephone Encounter (Signed)
Spoke with patient about his medication refills. He is aware that Sybil refilled his Xarelto this last time. His PCP will have to refill starting in November. Patient understands.

## 2013-05-02 NOTE — Telephone Encounter (Signed)
Crystal( patient's emergency contact) was returning call. See previous telephone encounter.

## 2013-05-04 ENCOUNTER — Encounter: Payer: Self-pay | Admitting: Physical Medicine & Rehabilitation

## 2013-05-04 ENCOUNTER — Encounter
Payer: No Typology Code available for payment source | Attending: Physical Medicine & Rehabilitation | Admitting: Physical Medicine & Rehabilitation

## 2013-05-04 VITALS — BP 154/84 | HR 77 | Resp 14 | Ht 72.0 in | Wt 238.0 lb

## 2013-05-04 DIAGNOSIS — I602 Nontraumatic subarachnoid hemorrhage from unspecified anterior communicating artery: Secondary | ICD-10-CM

## 2013-05-04 DIAGNOSIS — I82409 Acute embolism and thrombosis of unspecified deep veins of unspecified lower extremity: Secondary | ICD-10-CM

## 2013-05-04 DIAGNOSIS — M7522 Bicipital tendinitis, left shoulder: Secondary | ICD-10-CM | POA: Insufficient documentation

## 2013-05-04 DIAGNOSIS — I609 Nontraumatic subarachnoid hemorrhage, unspecified: Secondary | ICD-10-CM

## 2013-05-04 DIAGNOSIS — M7520 Bicipital tendinitis, unspecified shoulder: Secondary | ICD-10-CM

## 2013-05-04 MED ORDER — METHYLPHENIDATE HCL 10 MG PO TABS: 10.0000 mg | ORAL_TABLET | Freq: Two times a day (BID) | ORAL | Status: DC

## 2013-05-04 NOTE — Patient Instructions (Signed)
Biceps Tendon Tendinitis (Proximal) and Tenosynovitis with Rehab Tendonitis and tenosynovitis involve inflammation of the tendon and the tendon lining (sheath). The proximal biceps tendon is vulnerable to tendonitis and tenosynovitis, which causes pain and discomfort in the front of the shoulder and upper arm. The tendon lining secretes a fluid that helps lubricate the tendon, allowing for proper function without pain. When the tendon and its lining become inflamed, the tendon can no longer glide smoothly, causing pain. The proximal biceps tendon connects the biceps muscle to two bones of the shoulder. It is important for proper function of the elbow and turning the palm upward (supination) using the wrist. Proximal biceps tendon tendinitis may include a grade 1 or 2 strain of the tendon. Grade 1 strains involve a slight pull of the tendon without signs of tearing and no observed tendon lengthening. There is also no loss of strength. Grade 2 strains involve small tears in the tendon fibers. The tendon or muscle is stretched and strength is usually decreased.  SYMPTOMS   Pain, tenderness, swelling, warmth, or redness over the front of the shoulder.  Pain that gets worse with shoulder and elbow use, especially against resistance.  Limited motion of the shoulder or elbow.  Crackling sound (crepitation) when the tendon or shoulder is moved or touched. CAUSES  The symptoms of biceps tendonitis are due to inflammation of the tendon. Inflammation may be caused by:  Strain from sudden increase in amount or intensity of activity.  Direct blow or injury to the elbow (uncommon).  Overuse or repetitive elbow bending or wrist rotation, particularly when turning the palm up, or with elbow hyperextension. RISK INCREASES WITH:  Sports that involve contact or overhead arm activity (throwing sports, gymnastics, weightlifting, bodybuilding, rock climbing).  Heavy labor.  Poor strength and  flexibility.  Failure to warm up properly before activity. PREVENTION  Warm up and stretch properly before activity.  Allow time for recovery between activities.  Maintain physical fitness:  Strength, flexibility, and endurance.  Cardiovascular fitness.  Learn and use proper exercise technique. PROGNOSIS  With proper treatment, proximal biceps tendon tendonitis and tenosynovitis is usually curable within 6 weeks. Healing is usually quicker if the cause was a direct blow, not overuse.  RELATED COMPLICATIONS   Longer healing time if not properly treated or if not given enough time to heal.  Chronically inflamed tendon that causes persistent pain with activity, that may progress to constant pain and potentially rupture of the tendon.  Recurring symptoms, especially if activity is resumed too soon or with overuse, a direct blow, or use of poor exercise technique. TREATMENT Treatment first involves ice and medicine, to reduce pain and inflammation. It is helpful to modify activities that cause pain, to reduce the chances of causing the condition to get worse. Strengthening and stretching exercises should be performed to promote proper use of the muscles of the shoulder. These exercises may be performed at home or with a therapist. Other treatments may be given such as ultrasound or heat therapy. A corticosteroid injection may be recommended to help reduce inflammation of the tendon lining. Surgery is usually not necessary. Sometimes, if symptoms last for greater than 6 months, surgery will be advised to detach the tendon and re-insert it into the arm bone. Surgery to correct other shoulder problems that may be contributing to tendinitis may be advised before surgery for the tendinitis itself.  MEDICATION  If pain medicine is needed, nonsteroidal anti-inflammatory medicines (aspirin and ibuprofen), or other minor pain relievers (  acetaminophen), are often advised.  Do not take pain medicine  for 7 days before surgery.  Prescription pain relievers may be given if your caregiver thinks they are needed. Use only as directed and only as much as you need.  Corticosteroid injections may be given. These injections should only be used on the most severe cases, as one can only receive a limited number of them. HEAT AND COLD   Cold treatment (icing) should be applied for 10 to 15 minutes every 2 to 3 hours for inflammation and pain, and immediately after activity that aggravates your symptoms. Use ice packs or an ice massage.  Heat treatment may be used before performing stretching and strengthening activities prescribed by your caregiver, physical therapist, or athletic trainer. Use a heat pack or a warm water soak. SEEK MEDICAL CARE IF:   Symptoms get worse or do not improve in 2 weeks, despite treatment.  New, unexplained symptoms develop. (Drugs used in treatment may produce side effects.) EXERCISES RANGE OF MOTION (ROM) AND EXERCISES - Biceps Tendon (Proximal) and Tenosynovitis These exercises may help you when beginning to rehabilitate your injury. Your symptoms may go away with or without further involvement from your physician, physical therapist, or athletic trainer. While completing these exercises, remember:   Restoring tissue flexibility helps normal motion to return to the joints. This allows healthier, less painful movement and activity.  An effective stretch should be held for at least 30 seconds.  A stretch should never be painful. You should only feel a gentle lengthening or release in the stretched tissue. STRETCH  Flexion, Standing  Stand with good posture. With an underhand grip on your right / left hand and an overhand grip on the opposite hand, grasp a broomstick or cane so that your hands are a little more than shoulder width apart.  Keeping your right / left elbow straight and shoulder muscles relaxed, push the stick with your opposite hand to raise your right /  left arm in front of your body and then overhead. Raise your arm until you feel a stretch in your right / left shoulder, but before you have increased shoulder pain.  Try to avoid shrugging your right / left shoulder as your arm rises, by keeping your shoulder blade tucked down and toward your mid-back spine. Hold for __________ seconds.  Slowly return to the starting position. Repeat __________ times. Complete this exercise __________ times per day. STRETCH  Abduction, Supine  Lie on your back. With an underhand grip on your right / left hand and an overhand grip on the opposite hand, grasp a broomstick or cane so that your hands are a little more than shoulder width apart.  Keeping your right / left elbow straight and shoulder muscles relaxed, push the stick with your opposite hand to raise your right / left arm out to the side of your body and then overhead. Raise your arm until you feel a stretch in your right / left shoulder, but before you have increased shoulder pain.  Try to avoid shrugging your right / left shoulder as your arm rises, by keeping your shoulder blade tucked down and toward your mid-back spine. Hold for __________ seconds.  Slowly return to the starting position. Repeat __________ times. Complete this exercise __________ times per day. ROM  Flexion, Active-Assisted  Lie on your back. You may bend your knees for comfort.  Grasp a broomstick or cane so your hands are about shoulder width apart. Your right / left hand should   grip the end of the stick so that your hand is positioned "thumbs-up," as if you were about to shake hands.  Using your healthy arm to lead, raise your right / left arm overhead until you feel a gentle stretch in your shoulder. Hold for __________ seconds.  Use the stick to assist in returning your right / left arm to its starting position. Repeat __________ times. Complete this exercise __________ times per day.  STRETCH  Flexion, Standing   Stand  facing a wall. Walk your right / left fingers up the wall until you feel a moderate stretch in your shoulder. As your hand gets higher, you may need to step closer to the wall or use a door frame to walk through.  Try to avoid shrugging your right / left shoulder as your arm rises, by keeping your shoulder blade tucked down and toward your mid-back spine.  Hold for __________ seconds. Use your other hand, if needed, to ease out of the stretch and return to the starting position. Repeat __________ times. Complete this exercise __________ times per day.  ROM - Internal Rotation   Using underhand grips, grasp a stick behind your back with both hands.  While standing upright with good posture, slide the stick up your back until you feel a mild stretch in the front of your shoulder.  Hold for __________ seconds. Slowly return to your starting position. Repeat __________ times. Complete this exercise __________ times per day.  STRETCH - Internal Rotation  Place your right / left hand behind your back, palm-up.  Throw a towel or belt over your opposite shoulder. Grasp the towel with your right / left hand.  While keeping an upright posture, gently pull up on the towel until you feel a stretch in the front of your right / left shoulder.  Avoid shrugging your right / left shoulder as your arm rises, by keeping your shoulder blade tucked down and toward your mid-back spine.  Hold for __________ seconds. Release the stretch by lowering your opposite hand. Repeat __________ times. Complete this exercise __________ times per day. STRENGTHENING EXERCISES - Biceps Tendon Tendinitis (Proximal) and Tenosynovitis These exercises may help you regain your strength after your physician has discontinued your restraint in a cast or brace. They may resolve your symptoms with or without further involvement from your physician, physical therapist or athletic trainer. While completing these exercises, remember:    Muscles can gain both the endurance and the strength needed for everyday activities through controlled exercises.  Complete these exercises as instructed by your physician, physical therapist or athletic trainer. Increase the resistance and repetitions only as guided.  You may experience muscle soreness or fatigue, but the pain or discomfort you are trying to eliminate should never worsen during these exercises. If this pain does get worse, stop and make sure you are following the directions exactly. If the pain is still present after adjustments, discontinue the exercise until you can discuss the trouble with your caregiver. STRENGTH - Elbow Flexors, Isometric  Stand or sit upright on a firm surface. Place your right / left arm so that your hand is palm-up and at the height of your waist.  Place your opposite hand on top of your forearm. Gently push down as your right / left arm resists. Push as hard as you can with both arms, without causing any pain or movement at your right / left elbow. Hold this stationary position for __________ seconds.  Gradually release the tension in both   arms. Allow your muscles to relax completely before repeating. Repeat __________ times. Complete this exercise __________ times per day. STRENGTH - Shoulder Flexion, Isometric  With good posture and facing a wall, stand or sit about 4-6 inches away.  Keeping your right / left elbow straight, gently press the top of your fist into the wall. Increase the pressure gradually until you are pressing as hard as you can, without shrugging your shoulder or increasing any shoulder discomfort.  Hold for __________ seconds.  Release the tension slowly. Relax your shoulder muscles completely before you start the next repetition. Repeat __________ times. Complete this exercise __________ times per day.  STRENGTH  Elbow Flexors, Supinated  With good posture, stand or sit on a firm chair without armrests. Allow your right /  left arm to rest at your side with your palm facing forward.  Holding a __________ weight, or gripping a rubber exercise band or tubing,  bring your hand toward your shoulder.  Allow your muscles to control the resistance as your hand returns to your side. Repeat __________ times. Complete this exercise __________ times per day.  STRENGTH - Shoulder Flexion  Stand or sit with good posture. Grasp a __________ weight, or an exercise band or tubing, so that your hand is "thumbs-up," like when you shake hands.  Slowly lift your right / left arm as far as you can, without increasing any shoulder pain. At first, many people can only raise their hand to shoulder height.  Avoid shrugging your right / left shoulder as your arm rises, by keeping your shoulder blade tucked down and toward your mid-back spine.  Hold for __________ seconds. Control the descent of your hand as you slowly return to your starting position. Repeat __________ times. Complete this exercise __________ times per day. Document Released: 06/30/2005 Document Revised: 09/22/2011 Document Reviewed: 10/12/2008 [employer] Patient Information 2014 [employer]  

## 2013-05-04 NOTE — Progress Notes (Signed)
Subjective:    Patient ID: Dennis Wyatt, male    DOB: 04-25-1961, 52 y.o.   MRN: [id number]  HPI  Dennis Wyatt is back regarding his SAH. He has had continued pain in his left shoulder. It's not responding to stretching. It does feel better when he uses is biofreeze and freezing spray.   He has neuropsych testing scheduled for next month. We are still targeting a potential return to work at the beginning of the year.    From a standpoint of memory and attention, he sees improvement but is not back to baseline. He does occasionally feel "swimmy" headed as well.   Pain Inventory Average Pain 7 Pain Right Now 7 My pain is burning and stabbing  In the last 24 hours, has pain interfered with the following? General activity 6 Relation with others 7 Enjoyment of life 7 What TIME of day is your pain at its worst? varies Sleep (in general) Fair  Pain is worse with: walking and standing Pain improves with: rest, heat/ice, therapy/exercise, pacing activities and medication Relief from Meds: 5  Mobility walk without assistance use a cane how many minutes can you walk? 15 ability to climb steps?  yes do you drive?  yes Do you have any goals in this area?  yes  Function disabled: date disabled 11/2012 I need assistance with the following:  household duties and shopping  Neuro/Psych bladder control problems bowel control problems numbness trouble walking spasms dizziness confusion depression anxiety  Prior Studies Any changes since last visit?  no  Physicians involved in your care Any changes since last visit?  no   Family History  Problem Relation Age of Onset  . Heart disease Mother   . Stroke Mother   . Cancer Father   . Heart disease Father   . Hypertension Father    History   Social History  . Marital Status: Divorced    Spouse Name: N/A    Number of Children: N/A  . Years of Education: N/A   Social History Main Topics  . Smoking status: Current Every  Day Smoker -- 1.50 packs/day  . Smokeless tobacco: None  . Alcohol Use: Yes  . Drug Use: No  . Sexual Activity: None   Other Topics Concern  . None   Social History Narrative  . None   Past Surgical History  Procedure Laterality Date  . Radiology with anesthesia N/A 12/03/2012    Procedure: RADIOLOGY WITH ANESTHESIA;  Surgeon: Oneal Grout, MD;  Location: [facility] OR;  Service: Radiology;  Laterality: N/A;   Past Medical History  Diagnosis Date  . Hypertension    BP 154/84  Pulse 77  Resp 14  Ht 6' (1.829 m)  Wt 238 lb (107.956 kg)  BMI 32.27 kg/m2  SpO2 96%     Review of Systems  Gastrointestinal: Positive for diarrhea and constipation.  Genitourinary: Positive for difficulty urinating.  Musculoskeletal: Positive for arthralgias, gait problem and myalgias.  Neurological: Positive for dizziness and numbness.  Psychiatric/Behavioral: Positive for confusion and dysphoric mood. The patient is nervous/anxious.   All other systems reviewed and are negative.       Objective:   Physical Exam General: Alert and oriented x 3, No apparent distress  HEENT: Head is normocephalic, atraumatic, PERRLA, EOMI, sclera anicteric, oral mucosa pink and moist, dentition intact, ext ear canals clear,  Neck: Supple without JVD or lymphadenopathy  Heart: Reg rate and rhythm. No murmurs rubs or gallops  Chest: CTA bilaterally without wheezes,  rales, or rhonchi; no distress  Abdomen: Soft, non-tender, non-distended, bowel sounds positive.  Extremities: No clubbing, cyanosis, or edema. Pulses are 2+  Skin: Clean and intact without signs of breakdown  Neuro: Pt was alert and generally appropriate. Oriented to date, place, time. Processing, sequencing organization are better. Still only remembered 2/3 words after 5 minutes. Strength near 5/5. [facility] generally appropriate. CN non-focal. Walked with ER legs but no balance issues.  Musculoskeletal: Full ROM, No pain with AROM or PROM in the neck,  trunk, or extremities. Posture appropriate. No myofascial trigger points palpated. He does have a bit of a head forward posture. Lipoma seen at the lower medial border of the left scapula. Left short head bicep tendon tender at origin and with resistance of shoulder flexion.  Psych: Pt's affect is appropriate. Pt is cooperative      1. Functional deficits secondary to [facility] due to ruptured ACOM aneurysm  2. HTN  3. Headaches due to the above  4. Insomnia  5. LE DVT  6. Left shoulder pain most consistent with biceps tendonitis   Plan:  1. Continue ritalin at 20mg  qam XR form--refilled today.  2. Trazodone for sleep.  3. Await neuropsych testing to gage his ability to return to work. I cannot see him returning to work before December or January given residual cognitive and balance deficits.  4. Apply ice and work on basic stretching and rest for left shoulder. Instructions were provided. Naproxen one tab daily for one week. 5. No driving, no work for now.  6. DC xarelto---he's over 4 months out from popliteal, distal thrombi 6. 30 minutes of face to face patient care time were spent during this visit. All questions were encouraged and answered. I will follow up with him in 2 months.

## 2013-05-16 ENCOUNTER — Telehealth: Payer: Self-pay

## 2013-05-16 NOTE — Telephone Encounter (Signed)
Advised Crystal that patient is not to be driving.

## 2013-05-16 NOTE — Telephone Encounter (Signed)
Crystal Maiorana( Pt's EC) called and wanted to know if patient is able to drive his own personal vehicle yet?

## 2013-05-16 NOTE — Telephone Encounter (Signed)
Plan:  1. Continue ritalin at 20mg  qam XR form--refilled today.  2. Trazodone for sleep.  3. Await neuropsych testing to gage his ability to return to work. I cannot see him returning to work before December or January given residual cognitive and balance deficits.  4. Apply ice and work on basic stretching and rest for left shoulder. Instructions were provided. Naproxen one tab daily for one week.  5. No driving, no work for now.  6. DC xarelto---he's over 4 months out from popliteal, distal thrombi  6. 30 minutes of face to face patient care time were spent during this visit. All questions were encouraged and answered. I will follow up with him in 2 months.

## 2013-05-17 ENCOUNTER — Telehealth: Payer: Self-pay

## 2013-05-17 NOTE — Telephone Encounter (Signed)
Patients friend Maximino Greenland called to make [place] aware that the patient is driving his personal vehicle.  They have tried to disconnect the battery and not let him drive but he is anyway.  She would like [place] to be aware.  Spoke with patient and informed him he is not to be driving any vehicles.

## 2013-06-06 DIAGNOSIS — I609 Nontraumatic subarachnoid hemorrhage, unspecified: Secondary | ICD-10-CM

## 2013-06-06 DIAGNOSIS — F329 Major depressive disorder, single episode, unspecified: Secondary | ICD-10-CM

## 2013-06-06 DIAGNOSIS — I69919 Unspecified symptoms and signs involving cognitive functions following unspecified cerebrovascular disease: Secondary | ICD-10-CM

## 2013-06-06 DIAGNOSIS — F3289 Other specified depressive episodes: Secondary | ICD-10-CM

## 2013-06-07 DIAGNOSIS — I69919 Unspecified symptoms and signs involving cognitive functions following unspecified cerebrovascular disease: Secondary | ICD-10-CM

## 2013-06-07 DIAGNOSIS — I609 Nontraumatic subarachnoid hemorrhage, unspecified: Secondary | ICD-10-CM

## 2013-06-07 DIAGNOSIS — F329 Major depressive disorder, single episode, unspecified: Secondary | ICD-10-CM

## 2013-06-07 DIAGNOSIS — F3289 Other specified depressive episodes: Secondary | ICD-10-CM

## 2013-06-29 ENCOUNTER — Encounter
Payer: No Typology Code available for payment source | Attending: Physical Medicine & Rehabilitation | Admitting: Physical Medicine & Rehabilitation

## 2013-06-29 ENCOUNTER — Encounter: Payer: Self-pay | Admitting: Physical Medicine & Rehabilitation

## 2013-06-29 ENCOUNTER — Encounter (INDEPENDENT_AMBULATORY_CARE_PROVIDER_SITE_OTHER): Payer: Self-pay

## 2013-06-29 VITALS — BP 152/81 | HR 82 | Resp 14 | Ht 72.0 in | Wt 248.4 lb

## 2013-06-29 DIAGNOSIS — R51 Headache: Secondary | ICD-10-CM

## 2013-06-29 DIAGNOSIS — I609 Nontraumatic subarachnoid hemorrhage, unspecified: Secondary | ICD-10-CM | POA: Insufficient documentation

## 2013-06-29 DIAGNOSIS — R519 Headache, unspecified: Secondary | ICD-10-CM

## 2013-06-29 DIAGNOSIS — F341 Dysthymic disorder: Secondary | ICD-10-CM

## 2013-06-29 DIAGNOSIS — F329 Major depressive disorder, single episode, unspecified: Secondary | ICD-10-CM | POA: Insufficient documentation

## 2013-06-29 DIAGNOSIS — G4700 Insomnia, unspecified: Secondary | ICD-10-CM | POA: Insufficient documentation

## 2013-06-29 MED ORDER — METHYLPHENIDATE HCL 10 MG PO TABS: 10.0000 mg | ORAL_TABLET | Freq: Two times a day (BID) | ORAL | Status: DC

## 2013-06-29 MED ORDER — CITALOPRAM HYDROBROMIDE 20 MG PO TABS: 20.0000 mg | ORAL_TABLET | Freq: Every day | ORAL | Status: DC

## 2013-06-29 MED ORDER — TRAMADOL HCL 50 MG PO TABS: 50.0000 mg | ORAL_TABLET | Freq: Three times a day (TID) | ORAL | Status: DC | PRN

## 2013-06-29 NOTE — Progress Notes (Signed)
Subjective:    Patient ID: Dennis Wyatt, male    DOB: [birth year], 52 y.o.   MRN: [id number]  HPI  Sheena is back regarding his SAH. He had his neuropsych testing done last month. Dr. Leonides Cave found deficits with sustained attention, delayed processing. Pt reports issues with short term memory prior to the [facility], but doesn't feel it's much different from before.   From a standpoint of his employer, they are not pushing him to come back, although he had set some expectations of returning at the beginning.  From a standpoint of his mood, he feels frustrated and at times depressed.    Pain Inventory Average Pain 5 Pain Right Now 5 My pain is intermittent, burning, dull and tingling  In the last 24 hours, has pain interfered with the following? General activity 5 Relation with others 7 Enjoyment of life 7 What TIME of day is your pain at its worst? morning Sleep (in general) Poor  Pain is worse with: walking, sitting and standing Pain improves with: rest, heat/ice, therapy/exercise, pacing activities and medication Relief from Meds: 5  Mobility walk without assistance walk with assistance use a cane how many minutes can you walk? 20-25 ability to climb steps?  yes do you drive?  yes  Function disabled: date disabled 12/03/12 I need assistance with the following:  shopping Do you have any goals in this area?  yes  Neuro/Psych bladder control problems bowel control problems weakness numbness tingling  Prior Studies Any changes since last visit?  no  Physicians involved in your care Any changes since last visit?  no   Family History  Problem Relation Age of Onset  . Heart disease Mother   . Stroke Mother   . Cancer Father   . Heart disease Father   . Hypertension Father    History   Social History  . Marital Status: Divorced    Spouse Name: N/A    Number of Children: N/A  . Years of Education: N/A   Social History Main Topics  . Smoking status: Current  Every Day Smoker -- 1.50 packs/day  . Smokeless tobacco: None  . Alcohol Use: Yes  . Drug Use: No  . Sexual Activity: None   Other Topics Concern  . None   Social History Narrative  . None   Past Surgical History  Procedure Laterality Date  . Radiology with anesthesia N/A 12/03/2012    Procedure: RADIOLOGY WITH ANESTHESIA;  Surgeon: Oneal Grout, MD;  Location: [facility] OR;  Service: Radiology;  Laterality: N/A;   Past Medical History  Diagnosis Date  . Hypertension    BP 152/81  Pulse 82  Resp 14  Ht 6' (1.829 m)  Wt 248 lb 6.4 oz (112.674 kg)  BMI 33.68 kg/m2  SpO2 89%      Review of Systems  Gastrointestinal: Positive for constipation.  Genitourinary: Positive for decreased urine volume.       Bowel and bladder control problems  Neurological: Positive for weakness and numbness.       Tingling  All other systems reviewed and are negative.       Objective:   Physical Exam General: Alert and oriented x 3, No apparent distress  HEENT: Head is normocephalic, atraumatic, PERRLA, EOMI, sclera anicteric, oral mucosa pink and moist, dentition intact, ext ear canals clear,  Neck: Supple without JVD or lymphadenopathy  Heart: Reg rate and rhythm. No murmurs rubs or gallops  Chest: CTA bilaterally without wheezes, rales, or rhonchi; no  distress  Abdomen: Soft, non-tender, non-distended, bowel sounds positive.  Extremities: No clubbing, cyanosis, or edema. Pulses are 2+  Skin: Clean and intact without signs of breakdown  Neuro: Pt was alert and generally appropriate. Oriented to date, place, time. Processing, sequencing organization are better but still a little delayed. Strength near 5/5. [facility] generally appropriate. CN non-focal. No balance problems.  Musculoskeletal: Full ROM, No pain with AROM or PROM in the neck, trunk, or extremities. Posture appropriate. No myofascial trigger points palpated. Left shoulder still tender.  Psych: Pt's affect is appropriate. Pt is  cooperative     1. Functional deficits secondary to [facility] due to ruptured ACOM aneurysm- pt with persistent attention and processing deficits. Also with short term memory issues (partially premorbid) 2. HTN  3. Headaches due to the above  4. Insomnia  5. LE DVT  6. Left shoulder pain most consistent with biceps tendonitis    Plan:  1. Continue ritalin at 20mg  qam XR form--refilled today.  2. Trazodone for sleep may continue 3. 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 I did give him permission to try driving a personal vehicle with family in an empty parking lot or eventually low level traffic if there are no problems. He will need a formal driving evaluation to return to driving a commercial vehicle. 4. Tramadol was refilled today.  5. Will trial celexa 20mg  qhs.to assist with mood, reactive depression.  6. 30 minutes of face to face patient care time were spent during this visit. All questions were encouraged and answered. I will follow up with him in 2 months.

## 2013-06-29 NOTE — Patient Instructions (Signed)
TRY SOME PRACTICE DRIVING IN AN EMPTY PARKING LOT WITH YOUR FAMILY. IF THINGS GO VERY WELL, THEN YOU MAY TRY SOME SUPERVISED DRIVING LOCALLY DURING THE DAY WITH FAMILY.

## 2013-08-11 ENCOUNTER — Telehealth: Payer: Self-pay

## 2013-08-11 NOTE — Telephone Encounter (Signed)
Patient called requesting ABX for ear pain. Informed patient we do not prescribe antibiotics he would need to make appt with his PCP.

## 2013-08-15 ENCOUNTER — Telehealth: Payer: Self-pay

## 2013-08-15 NOTE — Telephone Encounter (Signed)
Patient's niece called and left a voicemail to return call so she could inform us of patient's symptoms.  Contacted patient's niece- She stated on "Thursday or Friday patient woke up with left side of his face drooped, and swollen. Patient's speech is slurred, but face is not as badly drooped now as it was on Thursday or Friday. Patient went to see his PCP Dr. Jeannetta NapElkins on Friday. The family has been trying to get the patient to go to the hospital to be evaluated.  Patient refuses.  I also recommended that the patient go to the hospital to be evaluated. The patient has appt on 2/4

## 2013-08-15 NOTE — Telephone Encounter (Signed)
I would assume that Dr. Jeannetta NapElkins also recommend a trip to the hospital?  I would recommend the same given the information provided to me.

## 2013-08-15 NOTE — Telephone Encounter (Signed)
I contacted patient himself and spoke with him and recommend that he go to the hospital to get evaluated. Patient said he did not have the money to go to the hospital.

## 2013-08-17 ENCOUNTER — Encounter
Payer: No Typology Code available for payment source | Attending: Physical Medicine & Rehabilitation | Admitting: Physical Medicine & Rehabilitation

## 2013-08-17 DIAGNOSIS — I609 Nontraumatic subarachnoid hemorrhage, unspecified: Secondary | ICD-10-CM | POA: Insufficient documentation

## 2013-08-17 DIAGNOSIS — G4700 Insomnia, unspecified: Secondary | ICD-10-CM | POA: Insufficient documentation

## 2013-08-30 ENCOUNTER — Encounter: Payer: No Typology Code available for payment source | Admitting: Physical Medicine & Rehabilitation

## 2013-09-01 ENCOUNTER — Encounter: Payer: No Typology Code available for payment source | Admitting: Physical Medicine and Rehabilitation

## 2013-09-15 ENCOUNTER — Encounter
Payer: No Typology Code available for payment source | Attending: Physical Medicine and Rehabilitation | Admitting: Physical Medicine and Rehabilitation

## 2013-09-15 ENCOUNTER — Encounter: Payer: Self-pay | Admitting: Physical Medicine and Rehabilitation

## 2013-09-15 VITALS — BP 153/87 | HR 78 | Resp 14 | Ht 72.0 in | Wt 260.0 lb

## 2013-09-15 DIAGNOSIS — F329 Major depressive disorder, single episode, unspecified: Secondary | ICD-10-CM

## 2013-09-15 DIAGNOSIS — F341 Dysthymic disorder: Secondary | ICD-10-CM

## 2013-09-15 DIAGNOSIS — G4700 Insomnia, unspecified: Secondary | ICD-10-CM | POA: Insufficient documentation

## 2013-09-15 DIAGNOSIS — I10 Essential (primary) hypertension: Secondary | ICD-10-CM | POA: Insufficient documentation

## 2013-09-15 DIAGNOSIS — G510 Bell's palsy: Secondary | ICD-10-CM | POA: Insufficient documentation

## 2013-09-15 DIAGNOSIS — F3289 Other specified depressive episodes: Secondary | ICD-10-CM | POA: Insufficient documentation

## 2013-09-15 DIAGNOSIS — F17200 Nicotine dependence, unspecified, uncomplicated: Secondary | ICD-10-CM | POA: Insufficient documentation

## 2013-09-15 DIAGNOSIS — M7522 Bicipital tendinitis, left shoulder: Secondary | ICD-10-CM

## 2013-09-15 DIAGNOSIS — I609 Nontraumatic subarachnoid hemorrhage, unspecified: Secondary | ICD-10-CM | POA: Insufficient documentation

## 2013-09-15 DIAGNOSIS — M25519 Pain in unspecified shoulder: Secondary | ICD-10-CM | POA: Insufficient documentation

## 2013-09-15 DIAGNOSIS — M7520 Bicipital tendinitis, unspecified shoulder: Secondary | ICD-10-CM | POA: Insufficient documentation

## 2013-09-15 MED ORDER — DICLOFENAC SODIUM 1 % TD GEL: 2.0000 g | Freq: Four times a day (QID) | TRANSDERMAL | Status: DC

## 2013-09-15 MED ORDER — CITALOPRAM HYDROBROMIDE 20 MG PO TABS: 20.0000 mg | ORAL_TABLET | Freq: Every day | ORAL | Status: DC

## 2013-09-15 NOTE — Progress Notes (Signed)
Subjective:    Patient ID: Dennis Wyatt, male    DOB: Dec 19, 1960, 53 y.o.   MRN: [id number]  HPI  Dennis Wyatt is back for follow up regarding his SAH due to ruptured ACOM aneurysm. Marland Kitchen. He's here with his niece who reports memory has improved and he seems to be back to baseline. He's managing his own financial affairs and expressed financial constraints due to not working as well as concerns about drop in 100 points on  his credit score. [place] and shows good reflexes and judgement per his niece. He would like to try to return to work.   He reports that he used celexa for a month and did not refill it as he did bit notice much benefit with it. Niece reports that she noticed a difference in him when he was on it. Left shoulder pain continues to bother him on and off. He reported right facial weakness due to Right  Bell's palsy last month. He had problems with pocketing as well as oral spillage with liquids.    Pain Inventory Average Pain 6 Pain Right Now 5 My pain is aching  In the last 24 hours, has pain interfered with the following? General activity 6 Relation with others 6 Enjoyment of life 6 What TIME of day is your pain at its worst? morning and night Sleep (in general) Poor  Pain is worse with: walking, bending and standing Pain improves with: pacing activities Relief from Meds: n/a  Mobility walk without assistance ability to climb steps?  yes  Function not employed: date last employed .  Neuro/Psych bladder control problems bowel control problems  Prior Studies Any changes since last visit?  no  Physicians involved in your care Dr Shelah LewandowskyElkin   Family History  Problem Relation Age of Onset  . Heart disease Mother   . Stroke Mother   . Cancer Father   . Heart disease Father   . Hypertension Father    History   Social History  . Marital Status: Divorced    Spouse Name: N/A    Number of Children: N/A  . Years of Education:  N/A   Social History Main Topics  . Smoking status: Current Every Day Smoker -- 1.50 packs/day  . Smokeless tobacco: None  . Alcohol Use: Yes  . Drug Use: No  . Sexual Activity: None   Other Topics Concern  . None   Social History Narrative  . None   Past Surgical History  Procedure Laterality Date  . Radiology with anesthesia N/A 12/03/2012    Procedure: RADIOLOGY WITH ANESTHESIA;  Surgeon: Oneal GroutSanjeev K Deveshwar, MD;  Location: [facility] OR;  Service: Radiology;  Laterality: N/A;   Past Medical History  Diagnosis Date  . Hypertension    BP 153/87  Pulse 78  Resp 14  Ht 6' (1.829 m)  Wt 260 lb (117.935 kg)  BMI 35.25 kg/m2  SpO2 96%  Opioid Risk Score:   Fall Risk Score: Moderate Fall Risk (6-13 points) (patient educated handout declined)   Review of Systems  Constitutional: Negative for appetite change and fatigue.  Respiratory: Negative for shortness of breath.   Cardiovascular: Negative for chest pain.  Gastrointestinal: Positive for constipation.  Genitourinary: Positive for difficulty urinating.  Musculoskeletal: Positive for back pain.  Neurological: Positive for headaches (on occasion). Negative for dizziness, tremors and numbness.  Psychiatric/Behavioral: Positive for sleep disturbance (hasn't had good sleep in days).  All other systems reviewed and  are negative.       Objective:   Physical Exam  Nursing note and vitals reviewed. Constitutional: He is oriented to person, place, and time. He appears well-developed and well-nourished.  Dressed in T shirt and shorts  HENT:  Head: Normocephalic and atraumatic.  Eyes: Conjunctivae are normal. Pupils are equal, round, and reactive to light.  Neck: Normal range of motion. Neck supple.  Cardiovascular: Normal rate and regular rhythm.   No murmur heard. Pulmonary/Chest: Effort normal and breath sounds normal. No respiratory distress. He has no wheezes.  Abdominal: Soft. Bowel sounds are normal.  Musculoskeletal: He  exhibits no edema.  Left shoulder tenderness with ROM  Neurological: He is alert and oriented to person, place, and time.  Speech clear and appropriate affect. Mild right facial weakness with sensory deficits.  Alert and appropriate. Strength 5/5. No balance deficits--gets out of chair without difficulty and walks with narrow based gait. He showed good judgement and discussed concerns about finances as well as return to work. Also discussed applying for disability. He brought up questions regarding  Driver's evaluation. Open to input from niece and was open to suggestions offered.    Skin: Skin is warm and dry.          Assessment & Plan:  1. Functional deficits secondary to [facility] from  ruptured ACOM aneurysm- shows improvement in attention. Continues with some STM issues but feels that these are baseline.  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 We also discussed good sleep to help with focus and attention. He is not using ritalin anymore--last filled in Dec 2014. To follow up with Dr. Callie Fielding in two months  2. HTN: blood pressure controlled.   3. Intermittent Headaches:  These are improving and not an issue at this time.  4. Insomnia: Poor sleep for past few days. He has not had good results with trazodone and need to refill his Zquill.   5. Left shoulder pain most consistent with biceps tendonitis: Trial of voltaren gel qid.    6. Depression with component of SADD:  Encouraged increasing activity level by walking 30 minutes for  3-4 days a weeks as daytime savings time around the corner. Strongly encouraged refilling celexa to help with mood and "pre-morbid short fuse"  7. Right Bell's palsy: Mild facial weakness noted and reports occasionally chocking on hard foods. Educated patient on  aspiration precautions--small bites, slowing rate, caution with hard/dry foods, and alternating solids with liquids.  Also  advised monitoring for pocketing in left cheek and using tongue to clear residue.

## 2013-09-15 NOTE — Patient Instructions (Signed)
GET CELEXA REFILLED AND USE IT DAILY WORK ON SLEEP WAKE CYCLE.  TRY TO WALK 2-3 TIMES A WEEK (GO TO THE PARK)

## 2013-10-06 ENCOUNTER — Telehealth: Payer: Self-pay

## 2013-10-06 NOTE — Telephone Encounter (Signed)
Patient called requesting the name of the psychiatrist that Dr.Swartz referred him to in September in 2014. Informed patient that it was Dr. Eula FlaxMichael Zelson.

## 2013-11-15 ENCOUNTER — Encounter: Payer: Self-pay | Admitting: Physical Medicine & Rehabilitation

## 2013-11-15 ENCOUNTER — Encounter: Payer: Self-pay | Attending: Physical Medicine & Rehabilitation | Admitting: Physical Medicine & Rehabilitation

## 2013-11-15 VITALS — BP 164/90 | HR 90 | Resp 14 | Ht 71.0 in | Wt 269.8 lb

## 2013-11-15 DIAGNOSIS — R51 Headache: Secondary | ICD-10-CM

## 2013-11-15 DIAGNOSIS — G4700 Insomnia, unspecified: Secondary | ICD-10-CM | POA: Insufficient documentation

## 2013-11-15 DIAGNOSIS — I10 Essential (primary) hypertension: Secondary | ICD-10-CM

## 2013-11-15 DIAGNOSIS — I609 Nontraumatic subarachnoid hemorrhage, unspecified: Secondary | ICD-10-CM | POA: Insufficient documentation

## 2013-11-15 DIAGNOSIS — R519 Headache, unspecified: Secondary | ICD-10-CM

## 2013-11-15 NOTE — Progress Notes (Signed)
Subjective:    Patient ID: Dennis Wyatt Wyatt, male    DOB: 16-Sep-1960, 53 y.o.   MRN: [id number]  HPI  Dennis Wyatt Wyatt is back regarding his SAH. For the most part he's doing well. He is still struggling with sleep. He was started on viibryd to replace his celexa. He is on it for 2 weeks now.   He takes the ritalin for attention but he doesn't notice a big difference at this point.   He is doing better with his memory. He still has STM deficits, but is taking notes which helps him quite a bit.   Dennis Wyatt Wyatt feels that he's aching all the time. His soreness is over his whole body! He uses voltaren gel, alleve, tylenol. His hips seem to be the most tender.    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 He has driven locally in his car and things have gone well. He has applied for disability with the assessment process pending.    Pain Inventory Average Pain 6 Pain Right Now 7 My pain is sharp, burning, dull and stabbing  In the last 24 hours, has pain interfered with the following? General activity 9 Relation with others 10 Enjoyment of life 1 What TIME of day is your pain at its worst? morning and night Sleep (in general) Poor  Pain is worse with: walking, bending, standing and some activites Pain improves with: rest, heat/ice, therapy/exercise and medication Relief from Meds: 3  Mobility use a cane how many minutes can you walk? 5-10 ability to climb steps?  yes do you drive?  yes  Function disabled: date disabled 12/03/12 I need assistance with the following:  household duties  Neuro/Psych bladder control problems weakness numbness trouble walking  Prior Studies Any changes since last visit?  no  Physicians involved in your care Any changes since last visit?  no   Family History  Problem Relation Age of Onset  . Heart disease Mother   . Stroke Mother   . Cancer Father   . Heart disease Father   . Hypertension  Father    History   Social History  . Marital Status: Divorced    Spouse Name: N/A    Number of Children: N/A  . Years of Education: N/A   Social History Main Topics  . Smoking status: Current Every Day Smoker -- 1.50 packs/day  . Smokeless tobacco: None  . Alcohol Use: Yes  . Drug Use: No  . Sexual Activity: None   Other Topics Concern  . None   Social History Narrative  . None   Past Surgical History  Procedure Laterality Date  . Radiology with anesthesia N/A 12/03/2012    Procedure: RADIOLOGY WITH ANESTHESIA;  Surgeon: Oneal GroutSanjeev K Deveshwar, MD;  Location: [facility] OR;  Service: Radiology;  Laterality: N/A;   Past Medical History  Diagnosis Date  . Hypertension    BP 164/90  Pulse 90  Resp 14  Ht 5\' 11"  (1.803 m)  Wt 269 lb 12.8 oz (122.38 kg)  BMI 37.65 kg/m2  SpO2 96%  Opioid Risk Score:   Fall Risk Score: Moderate Fall Risk (6-13 points) (educated and handout given for fall prevention in the home) Review of Systems  Constitutional: Positive for unexpected weight change.  Gastrointestinal: Positive for constipation.  Genitourinary: Positive for difficulty urinating.  Musculoskeletal: Positive for gait problem.  Neurological: Positive for weakness and numbness.  All other systems reviewed and  are negative.      Objective:   Physical Exam  General: Alert and oriented x 3, No apparent distress  HEENT: Head is normocephalic, atraumatic, PERRLA, EOMI, sclera anicteric, oral mucosa pink and moist, dentition intact, ext ear canals clear,  Neck: Supple without JVD or lymphadenopathy  Heart: Reg rate and rhythm. No murmurs rubs or gallops  Chest: CTA bilaterally without wheezes, rales, or rhonchi; no distress  Abdomen: Soft, non-tender, non-distended, bowel sounds positive.  Extremities: No clubbing, cyanosis, or edema. Pulses are 2+  Skin: Clean and intact without signs of breakdown  Neuro: intact sequencing of numbers. A little more difficulty with words. Up to day  current events.  Strength near 5/5. [facility] generally appropriate. CN non-focal. No balance problems.  Recalled 2/3 objects after 5 minutes. Improved general STM, remembered current events. Reasonable insight, awareness, and attention Musculoskeletal: Full ROM, No pain with AROM or PROM in the neck, trunk, or extremities. Posture appropriate. No myofascial trigger points palpated.  Left leg appears 1/4 inch shorter. Some valgus at each knee. Lumbar paraspinals tight on left.  Psych: Pt's affect is appropriate. Pt is cooperative.       1. [facility]- pt with persistent attention and processing deficits. Also with short term memory issues (partially premorbid)  2. HTN  3. Headaches due to the above  4. Insomnia  5. LE DVT  6. Bilateral hip pain/low back pain   Plan:  1.wean off ritalin as i'm not sure he's benefiting .   2.will add melatonin for sleep in addition to his zquill. Consider seroquel  3. 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 4. Utilize heat, ice, stretching, meloxicam and tylenol for back and hip pain. Also would like for him to try a 1/4 heel insert LLE.  5. Continue with Viibryd for depression per PCP.  6. 30 minutes of face to face patient care time were spent during this visit. All questions were encouraged and answered. I will follow up with him in 4 months.           Assessment & Plan:  1. [facility]- shows improvement in attention. Continues with some STM issues but feels that these are baseline. 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 We also discussed good sleep to help with focus and attention. He is not using ritalin anymore--last filled in Dec 2014. To follow up with Dr. Callie FieldingSwart in two months  2. HTN: blood pressure controlled.  3.  Intermittent Headaches: These are improving and not an issue at this time.  4. Insomnia: Poor sleep for past few days. He has not had good results with trazodone and need to refill his Zquill.  5. Left shoulder pain most consistent with biceps tendonitis: Trial of voltaren gel qid.   6. Depression with component of SADD: Encouraged increasing activity level by walking 30 minutes for 3-4 days a weeks as daytime savings time around the corner. Strongly encouraged refilling celexa to help with mood and "pre-morbid short fuse"  7. Right Bell's palsy: Mild facial weakness noted and reports occasionally chocking on hard foods. Educated patient on aspiration precautions--small bites, slowing rate, caution with hard/dry foods, and alternating solids with liquids. Also advised monitoring for pocketing in left cheek and using tongue to clear residue.

## 2013-11-15 NOTE — Patient Instructions (Addendum)
RITALIN: TAKE 5MG  (1/2 TAB) OF WHATEVER YOU HAVE LEFT THEN STOP.  STOP CELEXA IF YOU HAVEN'T ALREADY  1/4" HEEL CUSHION. LEFT SHOE----GIVE IT A CHANCE!!! ALSO NEED TO STRETCH YOUR BACK AND LEGS. NEED TO WORK ON WEIGHT AS WELL  AVOID BC POWDER.   MELATONIN---3-10MG  AT NIGHT FOR SLEEP. IF YOU'RE STILL STRUGGLING WE CAN TRY ANOTHER PRESCRIPTION DRUG.

## 2014-03-15 ENCOUNTER — Encounter: Payer: Self-pay | Admitting: Physical Medicine & Rehabilitation

## 2014-03-15 ENCOUNTER — Encounter: Payer: Self-pay | Attending: Physical Medicine & Rehabilitation | Admitting: Physical Medicine & Rehabilitation

## 2014-03-15 VITALS — BP 154/93 | HR 92 | Resp 14 | Wt 279.8 lb

## 2014-03-15 DIAGNOSIS — M545 Low back pain, unspecified: Secondary | ICD-10-CM | POA: Insufficient documentation

## 2014-03-15 DIAGNOSIS — G4700 Insomnia, unspecified: Secondary | ICD-10-CM | POA: Insufficient documentation

## 2014-03-15 DIAGNOSIS — I609 Nontraumatic subarachnoid hemorrhage, unspecified: Secondary | ICD-10-CM | POA: Insufficient documentation

## 2014-03-15 NOTE — Patient Instructions (Addendum)
YOU NEED TO WORK ON REGULAR STRETCHING, BETTER POSTURE, AND INCREASED EXERCISE.  WEIGHT LOSS NEEDS TO BE A FOCUS  WORK ON LEISURE ACTIVITIES AND SET SOME SHORT TERM AND LONG TERM GOALS   CONSIDER ANOTHER ANTIDEPRESSANT  PLEASE CALL ME WITH ANY PROBLEMS OR QUESTIONS (#[phone]).

## 2014-03-15 NOTE — Progress Notes (Signed)
Subjective:    Patient ID: Dennis Wyatt, male    DOB: [birth year], 53 y.o.   MRN: [id number]  HPI Dennis Wyatt is back regarding his SAH. He has had a fairly quiet summer. He's had some low back pain since a trip about a week ago. His headaches have been better except for a recent cold.   For pain he has used alleve and tylenol which haven't helped a whole lot.  His mood is irritable. He is depressed. He sees psychiatry in [place] who wrote him ?gabapentin and trazodone.   Pain Inventory Average Pain 7 Pain Right Now 6 My pain is sharp and burning  In the last 24 hours, has pain interfered with the following? General activity 5 Relation with others 6 Enjoyment of life 10 What TIME of day is your pain at its worst? morning Sleep (in general) Poor  Pain is worse with: walking, bending, standing and some activites Pain improves with: rest, heat/ice, therapy/exercise, pacing activities and medication Relief from Meds: 1  Mobility walk without assistance use a cane how many minutes can you walk? 10 ability to climb steps?  yes do you drive?  yes  Function disabled: date disabled 12/03/2012 I need assistance with the following:  meal prep, household duties and shopping  Neuro/Psych bladder control problems weakness numbness trouble walking depression  Prior Studies Any changes since last visit?  no  Physicians involved in your care Any changes since last visit?  no   Family History  Problem Relation Age of Onset  . Heart disease Mother   . Stroke Mother   . Cancer Father   . Heart disease Father   . Hypertension Father    History   Social History  . Marital Status: Divorced    Spouse Name: N/A    Number of Children: N/A  . Years of Education: N/A   Social History Main Topics  . Smoking status: Current Every Day Smoker -- 1.50 packs/day  . Smokeless tobacco: None  . Alcohol Use: Yes  . Drug Use: No  . Sexual Activity: None   Other Topics Concern  . None    Social History Narrative  . None   Past Surgical History  Procedure Laterality Date  . Radiology with anesthesia N/A 12/03/2012    Procedure: RADIOLOGY WITH ANESTHESIA;  Surgeon: Oneal Grout, MD;  Location: [facility] OR;  Service: Radiology;  Laterality: N/A;   Past Medical History  Diagnosis Date  . Hypertension    BP 154/93  Pulse 92  Resp 14  Wt 279 lb 12.8 oz (126.916 kg)  SpO2 94%  Opioid Risk Score:   Fall Risk Score: Moderate Fall Risk (6-13 points) (previously educated)  Review of Systems  Constitutional: Positive for unexpected weight change.  Respiratory: Positive for shortness of breath.   Genitourinary:       Bladder control problems  Musculoskeletal: Positive for gait problem.  Neurological: Positive for weakness and numbness.  Psychiatric/Behavioral: Positive for dysphoric mood.  All other systems reviewed and are negative.      Objective:   Physical Exam  General: Alert and oriented x 3, No apparent distress, obese especially abdominally  HEENT: Head is normocephalic, atraumatic, PERRLA, EOMI, sclera anicteric, oral mucosa pink and moist, dentition intact, ext ear canals clear,  Neck: Supple without JVD or lymphadenopathy  Heart: Reg rate and rhythm. No murmurs rubs or gallops  Chest: CTA bilaterally without wheezes, rales, or rhonchi; no distress  Abdomen: Soft, non-tender, non-distended, bowel sounds  positive.  Extremities: No clubbing, cyanosis, or edema. Pulses are 2+  Skin: Clean and intact without signs of breakdown  Neuro: intact sequencing of numbers. A little more difficulty with words. Up to day current events. Strength near 5/5. [facility] generally appropriate. CN non-focal. No balance problems. Recalled 2/3 objects after 5 minutes. Improved general STM, remembered current events. Reasonable insight, awareness, and attention  Musculoskeletal: LEFT HIP lower. Can bend and touch toes. Limited in extension Psych: Pt's affect is appropriate. Pt is  cooperative.    1     Assessment & Plan:   1. Functional deficits secondary to [facility] from ruptured ACOM aneurysm-  -encouraged increased ROM and better posture. Needs to increase exercise -increase leisure activities 2. HTN: blood pressure controlled.  3. Intermittent Headaches: generally controlled 4. Insomnia:trazodone 5. Left shoulder pain most consistent with biceps tendonitis: Trial of voltaren gel qid.   6. Depression: per pcp/psych. Consider another antidepressant 7. Low back pain. See #1. Needs to make a concerted effort to lose weight also  Fifteen minutes of face to face patient care time were spent during this visit. All questions were encouraged and answered.  See me prn

## 2014-08-10 IMAGING — CR DG CHEST 1V PORT
1 series · 1 of 1 positions shown · non-contrast
Comparison: 12/03/2012

CLINICAL DATA: Endotracheal tube placement

PORTABLE CHEST - 1 VIEW

[AP]
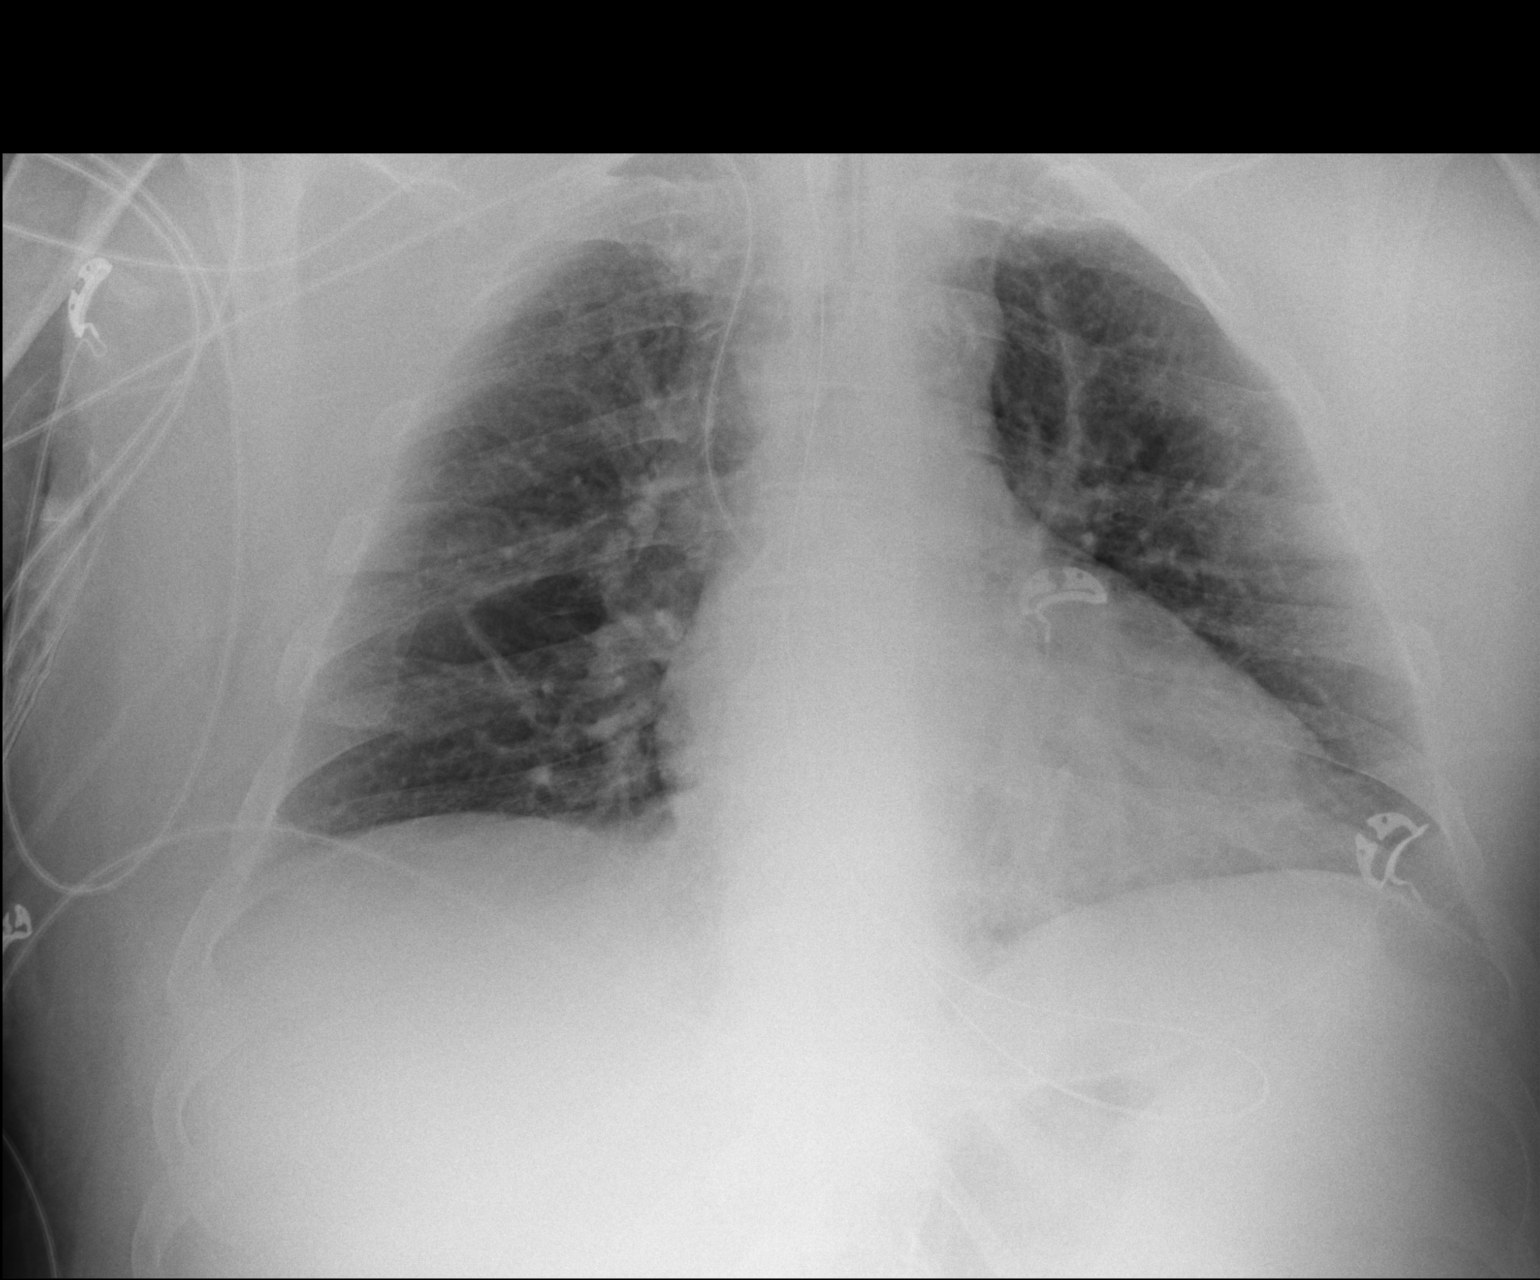

[1 of 1 positions shown; findings below may reference images not displayed]

FINDINGS: Endotracheal tube is appropriately positioned.
Nasogastric tube is coiled upon itself with tip not well visualized
by approximately oriented back upon itself over the distal
esophagus.  Heart size is upper limits of normal with central
vascular congestion and overall hypoaeration.  No new focal
pulmonary opacity.
IMPRESSION: Interval placement of nasogastric tube, coiled upon itself, tip
oriented over the approximate location of the distal esophagus.
Repositioning is recommended.

Low lung volumes with curvilinear bibasilar probable atelectasis.

## 2014-08-10 IMAGING — XA IR ANGIO/EXISTING CATHETER
1 series · 10 of 24 positions shown · IV contrast (IODINE)
Comparison: CT scan of brain of 12/03/2012.

CLINICAL DATA: Severe headaches, nausea, vomiting, collapse.  CT
scan demonstrates diffuse subarachnoid hemorrhage, and right
inferior frontal intracerebral hemorrhage.

BILATERAL COMMON CAROTID ARTERIOGRAMS AND BILATERAL VERTEBRAL
ARTERY ANGIOGRAMS FOLLOWED BY ENDOVASCULAR NEAR COMPLETE
OBLITERATION OF RUPTURED RIGHT ANTERIOR CEREBRAL ARTERY ACOM REGION
ANEURYSM

[Series 300: neuro · 10 of 413 slices shown]
[im 18/413]
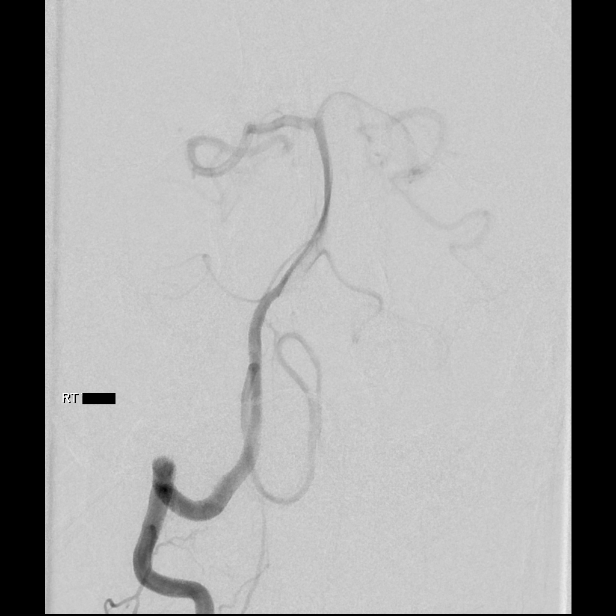
[im 54/413]
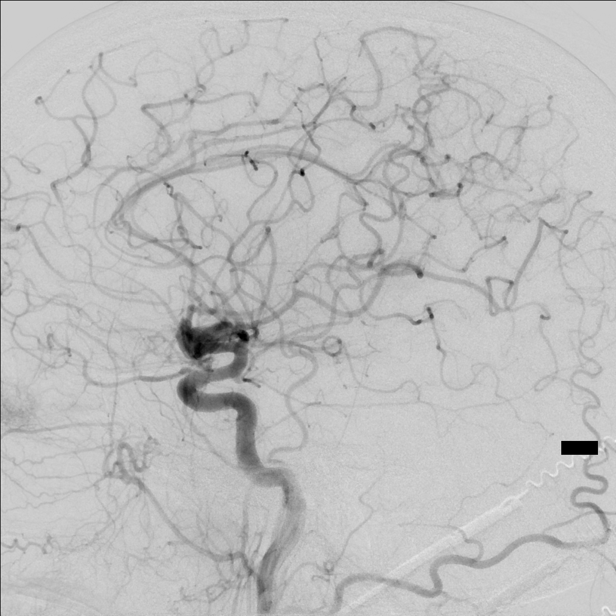
[im 108/413]
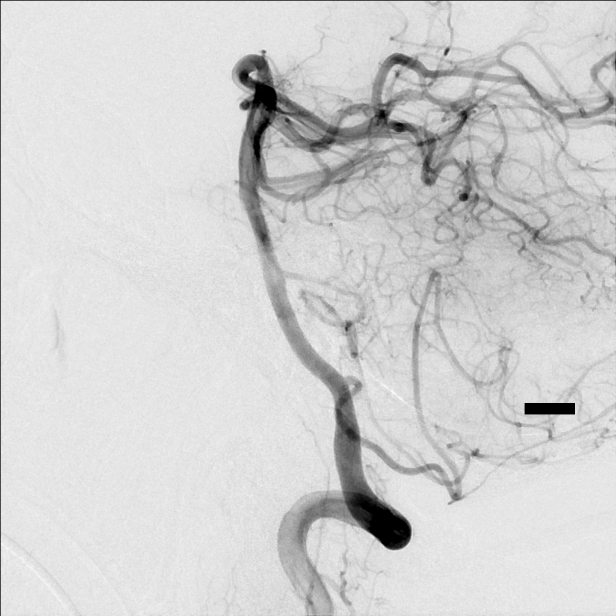
[im 144/413]
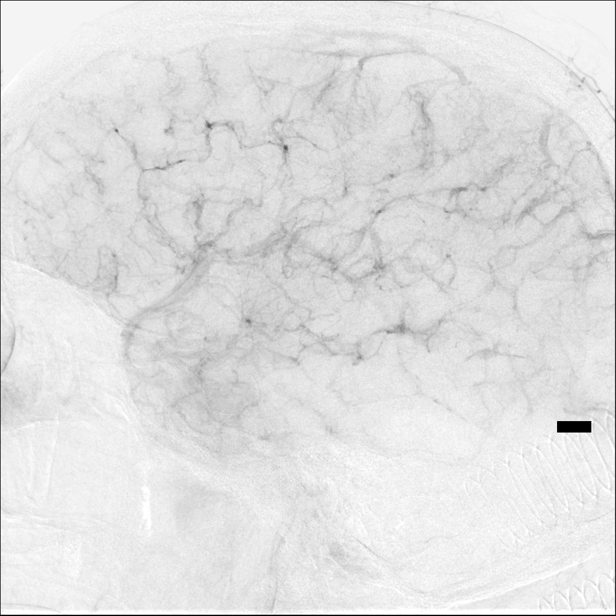
[im 180/413]
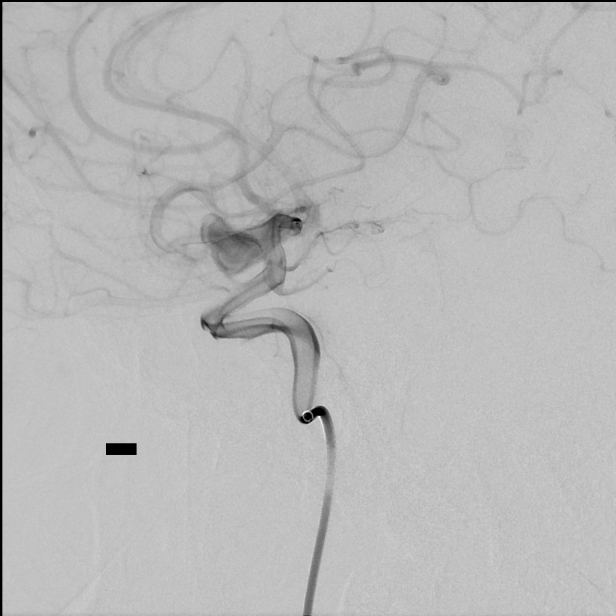
[im 233/413]
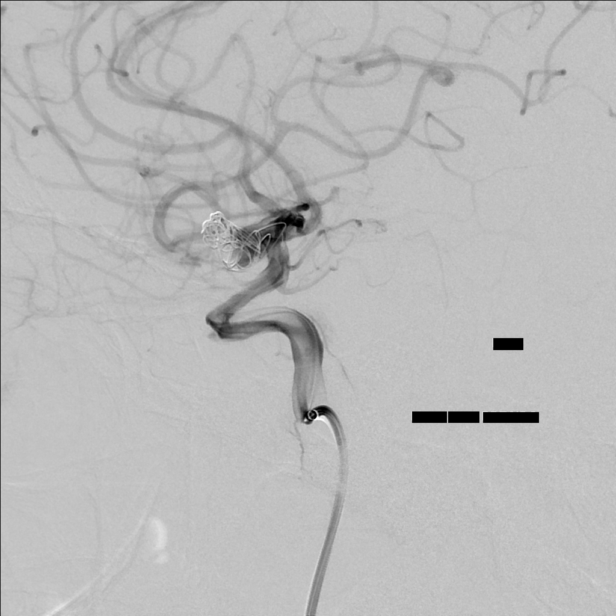
[im 269/413]
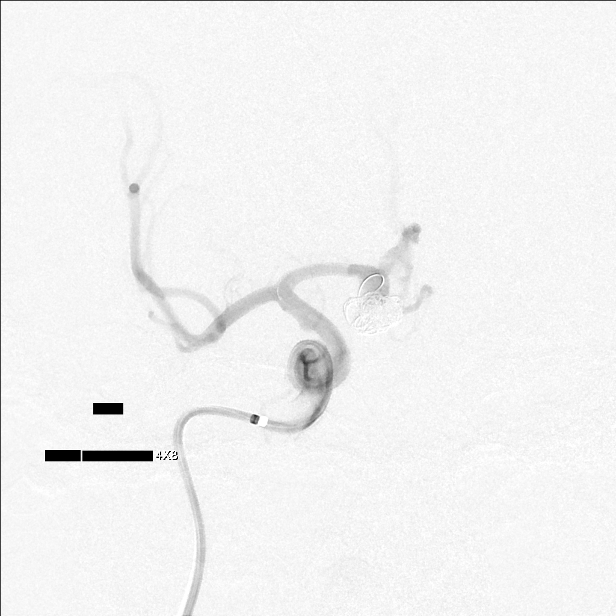
[im 323/413]
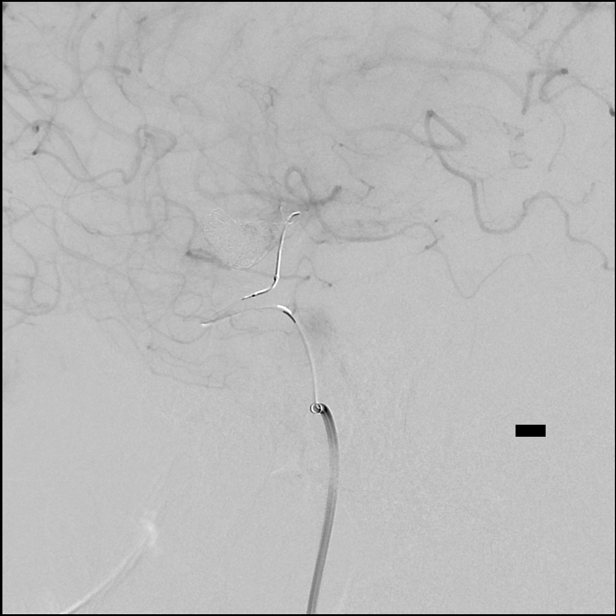
[im 359/413]
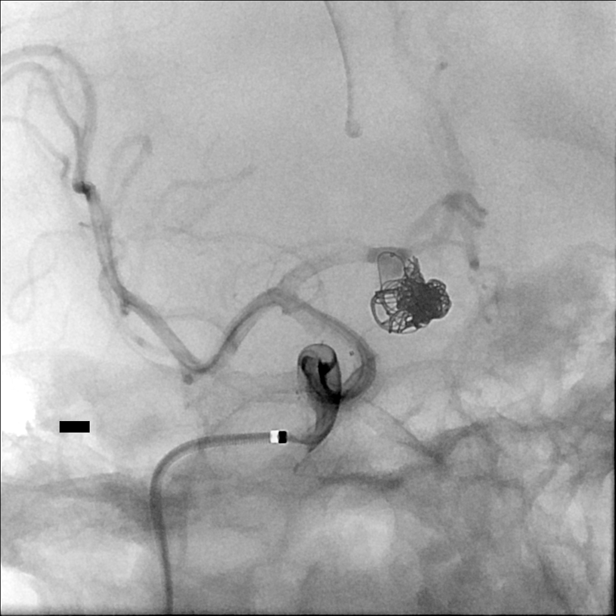
[im 395/413]
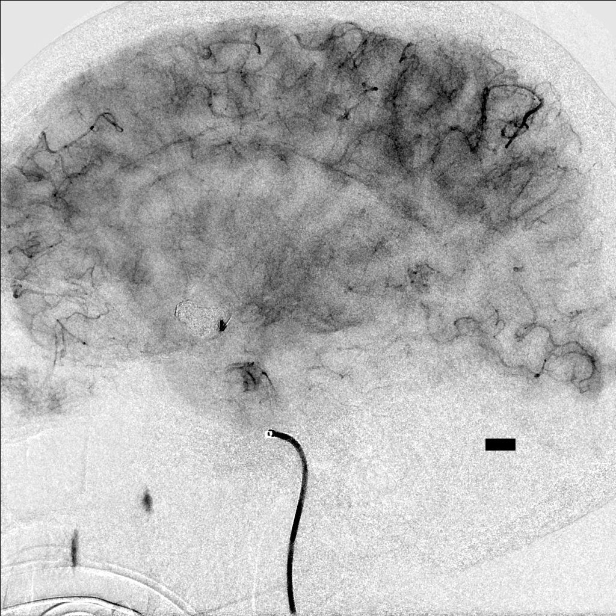

[10 of 24 positions shown; findings below may reference images not displayed]

Emergency consent for the diagnostic arteriogram and subsequent
therapeutic intervention was obtained after discussion with the
referring neurosurgeon.

The patient's brother was interviewed prior to the commencement of
the therapeutic intervention.
FINDINGS: The right vertebral artery origin is normal.  The vessel
opacifies normally to the cranial skull base.  There is normal
opacification of the right posterior inferior cerebellar artery and
the right vertebrobasilar junction.

The posterior cerebral arteries, the superior cerebellar arteries
and the anterior inferior cerebellar arteries that are opacified
demonstrate normal opacification into the capillary and venous
phases.  Unopacified blood is seen in the basilar artery from the
contralateral vertebral artery.

The right common carotid arteriogram demonstrates the right
external carotid artery and its major branches to be normal.

The right internal carotid artery at the bulb to the cranial skull
base also opacifies normally to the cranial skull base.

The petrous, the cavernous and the supraclinoid segments appear
widely patent.

The right middle and the right anterior cerebral arteries opacify
normally into the capillary and venous phases.

Arising at the junction of the right anterior cerebral A1 segment
with the anterior communicating artery was the presence of an
irregular approximately 12 mm x 7 mm intracranial aneurysm.  Cross
flow via the anterior communicating artery of the left anterior
cerebral artery A2 segment is seen.

Also seen is mass effect on the anterior cerebral arteries A2
segments bilaterally secondary to the inferiorly, medially
positioned hematoma seen on the CT scan.

The left vertebral artery origin is normal.  The vessel opacifies
normally to the cranial skull base.

There is normal opacification of the left posterior inferior
cerebellar artery and the left vertebrobasilar junction.

The basilar artery, the posterior cerebral arteries, the superior
cerebellar arteries and the anterior inferior cerebral arteries
opacify normally into the capillary and venous phases.  Unopacified
blood is seen in the basilar artery from the contralateral
vertebral artery.

The left common carotid arteriogram demonstrates the left external
carotid artery and its major branches to be normal.  The left
internal carotid artery at the bulb to the cranial skull base
opacifies normally.

The petrous, cavernous and the supraclinoid segments are normal.

The left middle cerebral artery opacifies normally into the
capillary and venous phases.

Opacification is also seen of the hypoplastic right anterior
cerebral artery A1 segment into the A2 segments and distally from
the left internal carotid artery injection.
IMPRESSION: 1.  Approximately 12 mm x 7 mm irregular aneurysm arising from the
junction of the anterior cerebral artery A1 segment with the
anterior communicating artery and pointing anteriorly and slightly
inferiorly.

ENDOVASCULAR NEAR COMPLETE OBLITERATION OF THE ANTERIOR
COMMUNICATING ARTERY WITH PRIMARY COILING

The findings of the arteriogram were discussed with the referring
neurosurgeon.

It was decided to proceed with endovascular treatment of this
ruptured aneurysm.

Prior to treatment a right frontal ventriculostomy was placed by
Dr. Jim.

The diagnostic JB1 catheter in the right common carotid artery was
exchanged over a 0.035-inch 300 cm Rosen exchange guidewire for a 6-
French 80 cm Tseegii Godinho using biplane roadmap technique
and constant fluoroscopic guidance.

Good aspiration was obtained from the side port of the Tuohy-Borst
at the hub of the Tseegii Godinho.  A gentle contrast injection
demonstrated no evidence of spasm, dissections, or of intraluminal
filling defects.

This was then connected to continuous heparinized saline infusion.

Over the Rosen exchange guidewire, a 5-French 105 cm Navien guide
catheter was then advanced and positioned just proximal to the
right common carotid artery bifurcation.

The guidewire was removed.  Good aspiration was obtained from the
hub of the 5-French guide catheter.

Over a 0.035-inch Roadrunner guidewire, this was then advanced into
the horizontal segment of the right internal carotid artery using
biplane roadmap technique and constant fluoroscopic guidance.  The
guidewire was removed.  Good aspiration was obtained from the hub
of the 5-French Navien guide catheter.

A gentle contrast injection demonstrated no evidence of spasm,
dissections or of intraluminal filling defects.

Using biplane technique and constant fluoroscopic guidance in a
coaxial manner and with constant heparinized saline infusion, a 150
cm Headway two-tip microcatheter which had been steam-shaped was
then advanced over a 0.014-inch Softip Synchro microguidewire to
the distal end of the 5-French guide catheter. With the
microguidewire leading with a J-tip configuration to avoid
dissections or inducing spasm, the combination was navigated to the
supraclinoid right ICA.  Using a torque device, the microguidewire
was then gently manipulated and advanced into the right A1 segment
followed by the microcatheter. The aneurysm was entered with the
microguidewire followed by the microcatheter.  The tip of the
microcatheter was positioned in the anterior third of the aneurysm.
The microguidewire was then gently retrieved under constant
fluoroscopic guidance ensuring no sudden forward motion movements
of the microcatheter.   None was observed.

Good aspiration was obtained from the hub of the microcatheter.
This was then connected to continuous heparinized saline infusion.

A control arteriogram performed through the 5-French Navien guide
catheter in the right internal carotid artery demonstrates safe
positioning of the intra-aneurysmal microcatheter for embolization
to begin. The first coil utilized was a 6 mm x 26 cm Cerecyte
Presidio coil.  This was advanced in a coaxial manner and with
constant heparinized saline infusion using biplane roadmap
technique and constant fluoroscopic guidance to the distal end of
the intra-aneurysmal microcatheter.  The coil was then delivered
into the aneurysm with a basket configuration having been achieved
covering the neck of the aneurysm.

A control arteriogram performed through the 5-French guide catheter
demonstrates safe configuration of the coil for detachment.

This was then detached without difficulty.  The was then followed
by a 5 mm x 17 cm Presidio Cerecyte coil, a 5 mm x 17 cm Presidio
Cerecyte coil, a 4 mm x 11.5 cm Presidio coil, a 4 mm x 11.5 cm
Presidio coil, a 4 mm x 8 cm Cashmere Cerecyte coil, a 4 mm x 10 cm
DeltaPaq Cerecyte coil, 4 mm x 8 cm coil, a 4 mm x 8 cm DeltaPaq
Cerecyte coil, a Hypersoft helical 3 mm x 6 cm coil.   Each of
these coils was advanced into the aneurysm using biplane roadmap
technique and constant fluoroscopic guidance.  Prior to the
detachment of each coil, a control arteriogram was performed
through the 5-French guide catheter to ensure safe positioning of
the coil mass.

Advancement of further coils was met with herniation of the coil
loops into the parent vessel. A control arteriogram performed
through the 5-French guide catheter demonstrated near complete
obliteration of the aneurysm with a tiny neck remnant along the
lateral aspect.

The patency of the anterior cerebral artery, the anterior
communicating artery and the anterior cerebral artery A2 segment
was maintained.

No evidence of intracranial filling defects within the vessels was
seen.

Throughout the procedure, there was no angiographic evidence of
extravasation or sudden changes in the blood pressure or heart
rate.

The patient was given 5000 units of IV heparin toward the latter
end of the embolization.

The microcatheter was then gently retrieved from the coil mass
ensuring no movement of the coils.

None was observed.

The microcatheter was then gently retrieved and removed.

A final control arteriogram performed through the 5-French Navien
guide catheter demonstrated near complete obliteration of the
aneurysm, with maintenance of flow in the anterior cerebral artery
and the anterior communicating artery.  No change was noticeable in
the mass effect on the anterior cerebral artery A2 segments
bilaterally seen prior to the treatment.

IMPRESSION
1.  Status post endovascular near complete obliteration of ruptured
right anterior cerebral artery, A1-ACom region aneurysm.

## 2014-08-15 IMAGING — CT CT HEAD W/O CM
1 of 2 series · 13 of 30 positions shown, 17 images · non-contrast
Comparison: CT head without contrast 12/03/2012.

CLINICAL DATA: Intracranial hemorrhage.  Status post coil
embolization of an anterior communicating artery aneurysm.  The
patient pulled as ventriculostomy drain out today.

CT HEAD WITHOUT CONTRAST
TECHNIQUE: Contiguous axial images were obtained from the base of
the skull through the vertex without contrast.

[Series 2: brain · axial · 0.47mm/px · z∈[+153,+288]mm · 13 of 40 slices shown, 17 images]
[im 3/40  brain]
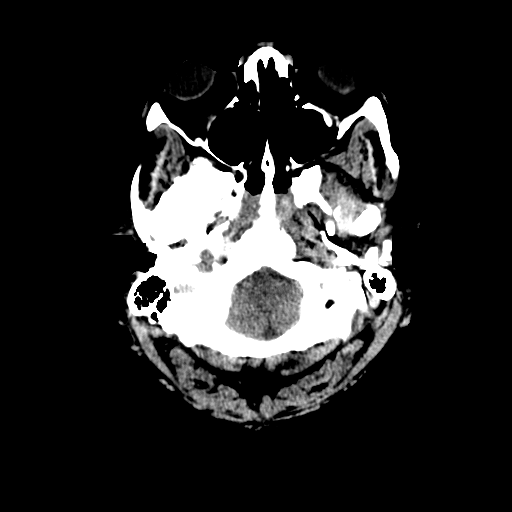
[im 3/40  bone]
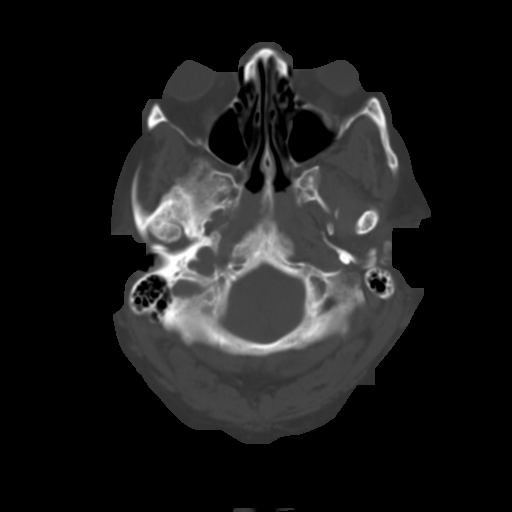
[im 6/40  brain]
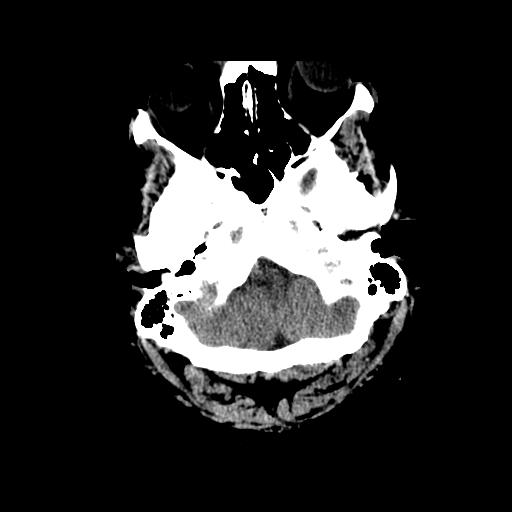
[im 9/40  brain]
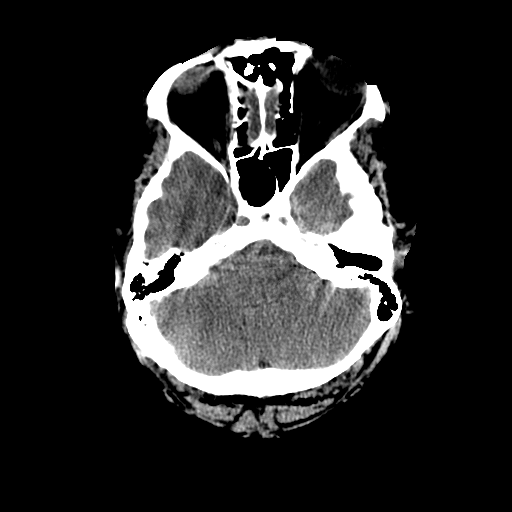
[im 12/40  brain]
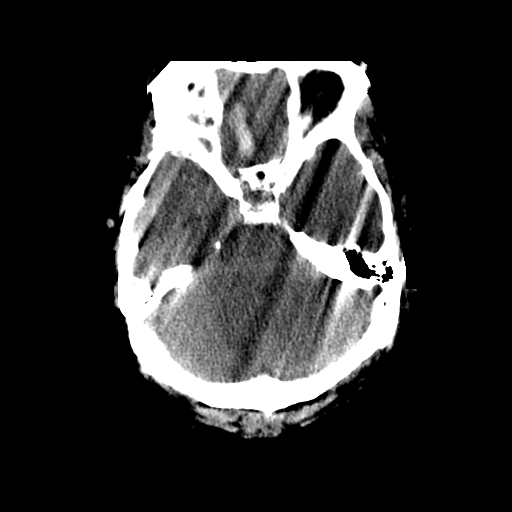
[im 14/40  brain]
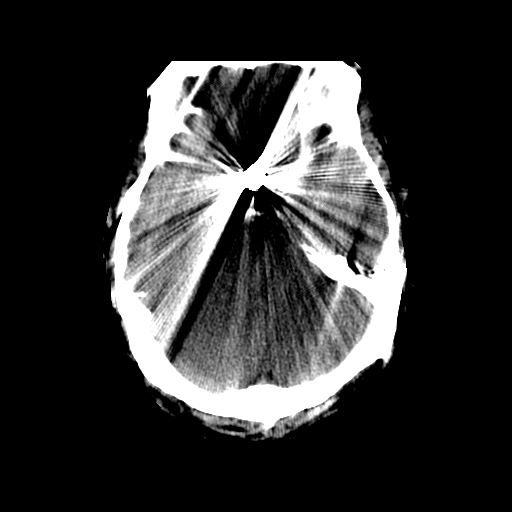
[im 14/40  bone]
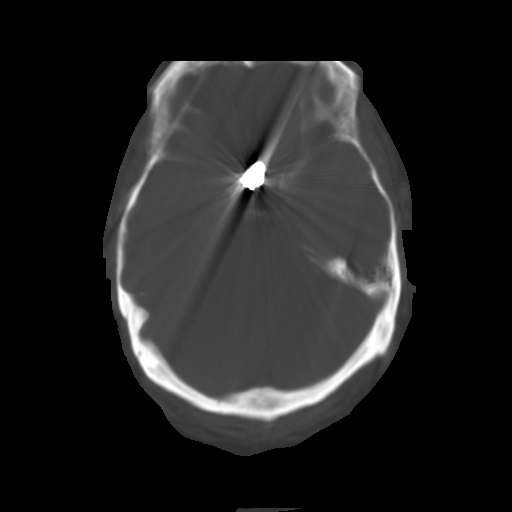
[im 17/40  brain]
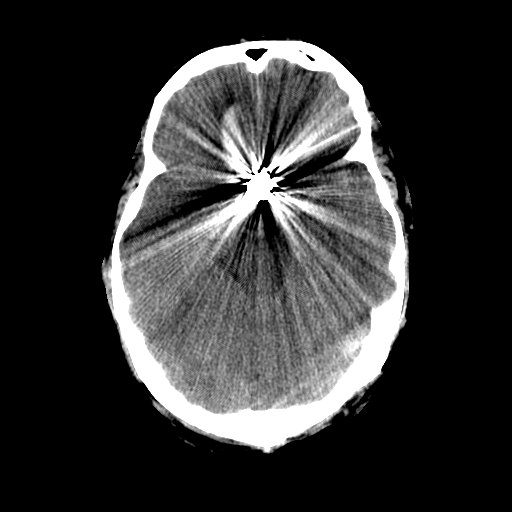
[im 20/40  brain]
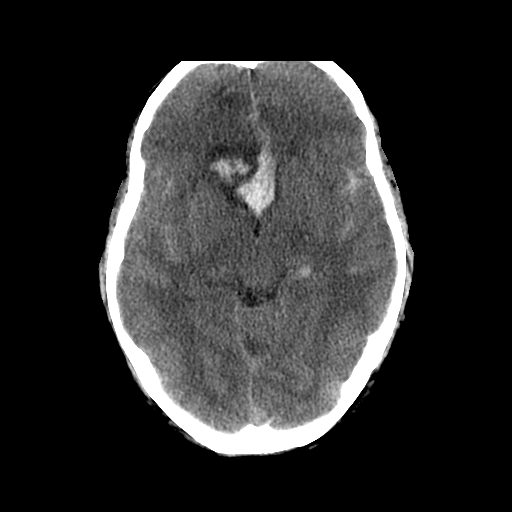
[im 23/40  brain]
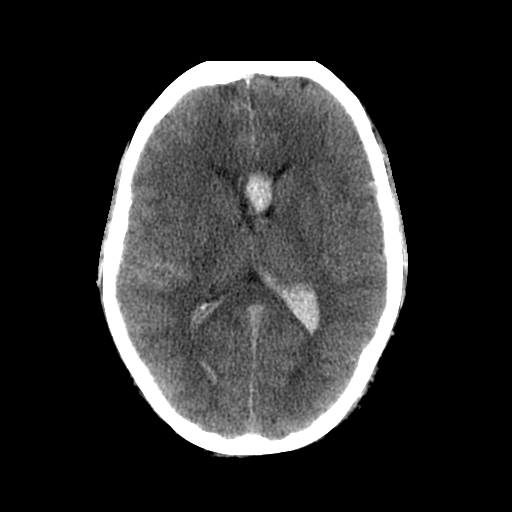
[im 26/40  brain]
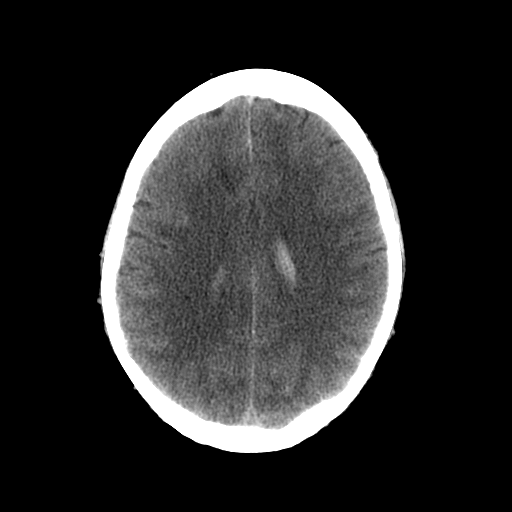
[im 26/40  bone]
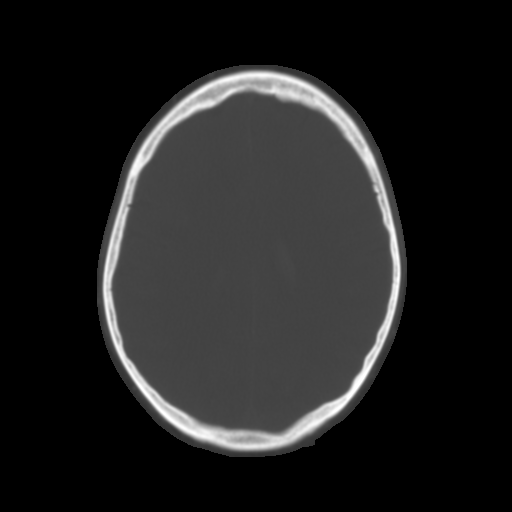
[im 28/40  brain]
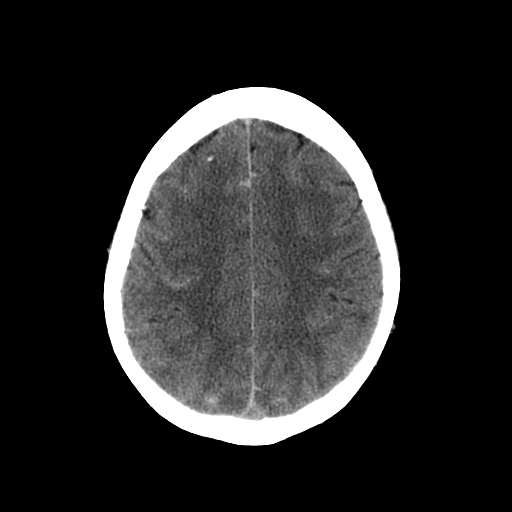
[im 31/40  brain]
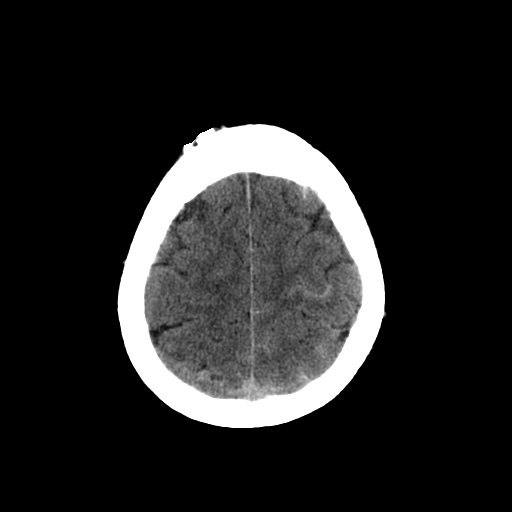
[im 34/40  brain]
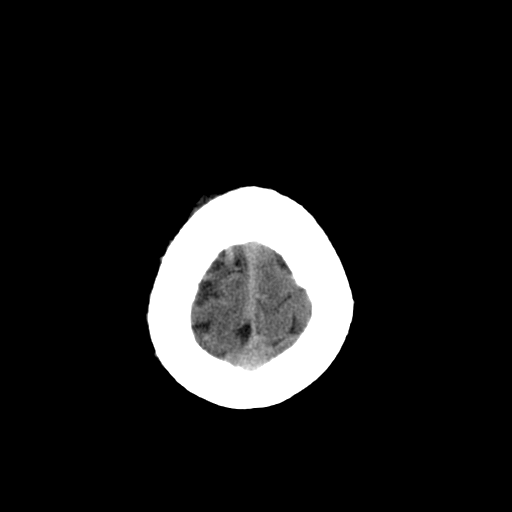
[im 37/40  brain]
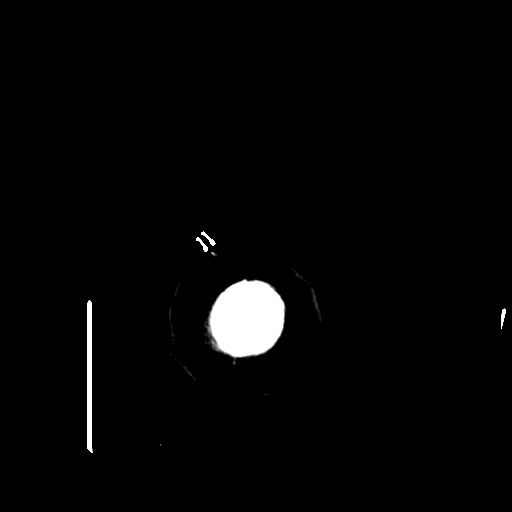
[im 37/40  bone]
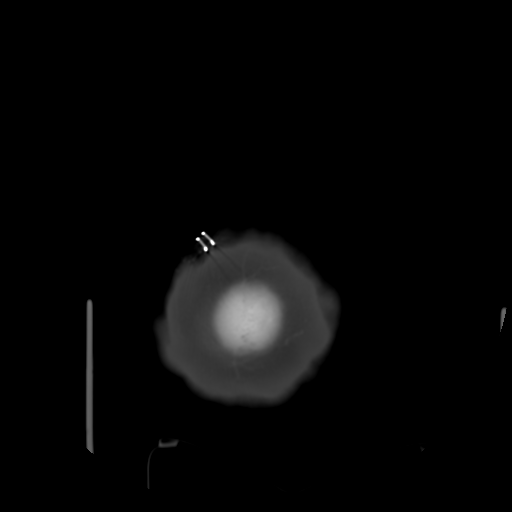

[13 of 30 positions shown; findings below may reference images not displayed]

FINDINGS: The right frontal ventriculostomy catheter is no longer
present.  Extensive intraventricular hemorrhage persists.  The
ventricles have decreased in size since the prior exam.
Ventricular hemorrhage has decreased as well.  Diffuse subarachnoid
hemorrhage is again noted without definite areas of new hemorrhage.
Right frontal parenchymal hemorrhage is again noted.  There is been
some retraction of the clot with expected surrounding edema.  The
basil cisterns are better visualized on today's exam, suggesting
slight decrease in intraventricular mass effect.

The coil mass is not significantly changed.

The paranasal sinuses and mastoid air cells are clear.  The right
frontal burr hole is present.  The osseous skull is otherwise
intact.
IMPRESSION: 1.  Interval decrease in size of intraventricular hemorrhage and
ventricles.
2.  Interval removal of the right frontal ventriculostomy drain.
3.  Persistent diffuse subarachnoid hemorrhage without definite
areas of new hemorrhage.
4.  Improved visualization of the basal cisterns suggests decreased
intracranial mass effect.

## 2016-04-20 ENCOUNTER — Ambulatory Visit: Payer: Self-pay | Admitting: Orthopedic Surgery

## 2016-04-30 ENCOUNTER — Encounter (HOSPITAL_COMMUNITY): Payer: Self-pay

## 2016-07-10 NOTE — Progress Notes (Signed)
Please re-place SURGICAL ORDERS IN EPIC--the set from 10/17 will be too old by surgery day and will drop out of epic Thanks

## 2016-07-15 ENCOUNTER — Ambulatory Visit: Payer: Self-pay | Admitting: Orthopedic Surgery

## 2016-07-31 NOTE — Patient Instructions (Signed)
Dennis Wyatt Dennis Wyatt  07/31/2016   Your procedure is scheduled on: Wednesday 08/06/2016  [facility]  Entrance take [place]  elevators to 3rd floor to  Short Stay Center at   0515 AM.  Call this number if you have problems the morning of surgery [phone]   Remember: ONLY 1 PERSON MAY GO WITH YOU TO SHORT STAY TO GET  READY MORNING OF YOUR SURGERY.   Do not eat food or drink liquids :After Midnight.     Take these medicines the morning of surgery with A SIP OF WATER: none                                You may not have any metal on your body including hair pins and              piercings  Do not wear jewelry, make-up, lotions, powders or perfumes, deodorant             Do not wear nail polish.  Do not shave  48 hours prior to surgery.              Men may shave face and neck.   Do not bring valuables to the hospital. [name] IS NOT             RESPONSIBLE   FOR VALUABLES.  Contacts, dentures or bridgework may not be worn into surgery.  Leave suitcase in the car. After surgery it may be brought to your room.                  Please read over the following fact sheets you were given: _____________________________________________________________________             [facility] - Preparing for [facility] Before surgery, you can play an important role.  Because skin is not sterile, your skin needs to be as free of germs as possible.  You can reduce the number of germs on your skin by washing with CHG (chlorahexidine gluconate) soap before surgery.  CHG is an antiseptic cleaner which kills germs and bonds with the skin to continue killing germs even after washing. Please DO NOT use if you have an allergy to CHG or antibacterial soaps.  If your skin becomes reddened/irritated stop using the CHG and inform your nurse when you arrive at Short Stay. Do not shave (including legs and underarms) for at least 48 hours prior to the first CHG shower.  You may  shave your face/neck. Please follow these instructions carefully:  1.  Shower with CHG Soap the night before surgery and the  morning of Surgery.  2.  If you choose to wash your hair, wash your hair first as usual with your  normal  shampoo.  3.  After you shampoo, rinse your hair and body thoroughly to remove the  shampoo.                           4.  Use CHG as you would any other liquid soap.  You can apply chg directly  to the skin and wash                       Gently with a scrungie or clean washcloth.  5.  Apply  the CHG Soap to your body ONLY FROM THE NECK DOWN.   Do not use on face/ open                           Wound or open sores. Avoid contact with eyes, ears mouth and genitals (private parts).                       Wash face,  Genitals (private parts) with your normal soap.             6.  Wash thoroughly, paying special attention to the area where your surgery  will be performed.  7.  Thoroughly rinse your body with warm water from the neck down.  8.  DO NOT shower/wash with your normal soap after using and rinsing off  the CHG Soap.                9.  Pat yourself dry with a clean towel.            10.  Wear clean pajamas.            11.  Place clean sheets on your bed the night of your first shower and do not  sleep with pets. Day of Surgery : Do not apply any lotions/deodorants the morning of surgery.  Please wear clean clothes to the hospital/surgery center.  FAILURE TO FOLLOW THESE INSTRUCTIONS MAY RESULT IN THE CANCELLATION OF YOUR SURGERY PATIENT SIGNATURE_________________________________  NURSE SIGNATURE__________________________________  ________________________________________________________________________   Dennis Wyatt  An incentive spirometer is a tool that can help keep your lungs clear and active. This tool measures how well you are filling your lungs with each breath. Taking long deep breaths may help reverse or decrease the chance of developing  breathing (pulmonary) problems (especially infection) following:  A long period of time when you are unable to move or be active. BEFORE THE PROCEDURE   If the spirometer includes an indicator to show your best effort, your nurse or respiratory therapist will set it to a desired goal.  If possible, sit up straight or lean slightly forward. Try not to slouch.  Hold the incentive spirometer in an upright position. INSTRUCTIONS FOR USE  1. Sit on the edge of your bed if possible, or sit up as far as you can in bed or on a chair. 2. Hold the incentive spirometer in an upright position. 3. Breathe out normally. 4. Place the mouthpiece in your mouth and seal your lips tightly around it. 5. Breathe in slowly and as deeply as possible, raising the piston or the ball toward the top of the column. 6. Hold your breath for 3-5 seconds or for as long as possible. Allow the piston or ball to fall to the bottom of the column. 7. Remove the mouthpiece from your mouth and breathe out normally. 8. Rest for a few seconds and repeat Steps 1 through 7 at least 10 times every 1-2 hours when you are awake. Take your time and take a few normal breaths between deep breaths. 9. The spirometer may include an indicator to show your best effort. Use the indicator as a goal to work toward during each repetition. 10. After each set of 10 deep breaths, practice coughing to be sure your lungs are clear. If you have an incision (the cut made at the time of surgery), support your incision when coughing by placing a pillow or rolled  up towels firmly against it. Once you are able to get out of bed, walk around indoors and cough well. You may stop using the incentive spirometer when instructed by your caregiver.  RISKS AND COMPLICATIONS  Take your time so you do not get dizzy or light-headed.  If you are in pain, you may need to take or ask for pain medication before doing incentive spirometry. It is harder to take a deep  breath if you are having pain. AFTER USE  Rest and breathe slowly and easily.  It can be helpful to keep track of a log of your progress. Your caregiver can provide you with a simple table to help with this. If you are using the spirometer at home, follow these instructions: [place] IF:   You are having difficultly using the spirometer.  You have trouble using the spirometer as often as instructed.  Your pain medication is not giving enough relief while using the spirometer.  You develop fever of 100.5 F (38.1 C) or higher. SEEK IMMEDIATE MEDICAL CARE IF:   You cough up bloody sputum that had not been present before.  You develop fever of 102 F (38.9 C) or greater.  You develop worsening pain at or near the incision site. MAKE SURE YOU:   Understand these instructions.  Will watch your condition.  Will get help right away if you are not doing well or get worse. Document Released: 11/10/2006 Document Revised: 09/22/2011 Document Reviewed: 01/11/2007 Exit[employer] Patient Information 2014 Exit[employer], [place].   ________________________________________________________________________  WHAT IS A BLOOD TRANSFUSION? Blood Transfusion Information  A transfusion is the replacement of blood or some of its parts. Blood is made up of multiple cells which provide different functions.  Red blood cells carry oxygen and are used for blood loss replacement.  White blood cells fight against infection.  Platelets control bleeding.  Plasma helps clot blood.  Other blood products are available for specialized needs, such as hemophilia or other clotting disorders. BEFORE THE TRANSFUSION  Who gives blood for transfusions?   Healthy volunteers who are fully evaluated to make sure their blood is safe. This is blood bank blood. Transfusion therapy is the safest it has ever been in the practice of medicine. Before blood is taken from a donor, a complete history is taken to make sure  that person has no history of diseases nor engages in risky social behavior (examples are intravenous drug use or sexual activity with multiple partners). The donor's travel history is screened to minimize risk of transmitting infections, such as malaria. The donated blood is tested for signs of infectious diseases, such as HIV and hepatitis. The blood is then tested to be sure it is compatible with you in order to minimize the chance of a transfusion reaction. If you or a relative donates blood, this is often done in anticipation of surgery and is not appropriate for emergency situations. It takes many days to process the donated blood. RISKS AND COMPLICATIONS Although transfusion therapy is very safe and saves many lives, the main dangers of transfusion include:   Getting an infectious disease.  Developing a transfusion reaction. This is an allergic reaction to something in the blood you were given. Every precaution is taken to prevent this. The decision to have a blood transfusion has been considered carefully by your caregiver before blood is given. Blood is not given unless the benefits outweigh the risks. AFTER THE TRANSFUSION  Right after receiving a blood transfusion, you will usually feel  much better and more energetic. This is especially true if your red blood cells have gotten low (anemic). The transfusion raises the level of the red blood cells which carry oxygen, and this usually causes an energy increase.  The nurse administering the transfusion will monitor you carefully for complications. HOME CARE INSTRUCTIONS  No special instructions are needed after a transfusion. You may find your energy is better. Speak with your caregiver about any limitations on activity for underlying diseases you may have. SEEK MEDICAL CARE IF:   Your condition is not improving after your transfusion.  You develop redness or irritation at the intravenous (IV) site. SEEK IMMEDIATE MEDICAL CARE IF:  Any of  the following symptoms occur over the next 12 hours:  Shaking chills.  You have a temperature by mouth above 102 F (38.9 C), not controlled by medicine.  Chest, back, or muscle pain.  People around you feel you are not acting correctly or are confused.  Shortness of breath or difficulty breathing.  Dizziness and fainting.  You get a rash or develop hives.  You have a decrease in urine output.  Your urine turns a dark color or changes to pink, red, or brown. Any of the following symptoms occur over the next 10 days:  You have a temperature by mouth above 102 F (38.9 C), not controlled by medicine.  Shortness of breath.  Weakness after normal activity.  The white part of the eye turns yellow (jaundice).  You have a decrease in the amount of urine or are urinating less often.  Your urine turns a dark color or changes to pink, red, or brown. Document Released: 06/27/2000 Document Revised: 09/22/2011 Document Reviewed: 02/14/2008 [facility] Patient Information 2014 [place], [place].  _______________________________________________________________________

## 2016-08-01 ENCOUNTER — Encounter (HOSPITAL_COMMUNITY)
Admission: RE | Admit: 2016-08-01 | Discharge: 2016-08-01 | Disposition: A | Discharge status: Home / Self Care | Payer: PPO | Source: Ambulatory Visit | Attending: Orthopedic Surgery | Admitting: Orthopedic Surgery

## 2016-08-01 ENCOUNTER — Encounter (HOSPITAL_COMMUNITY): Payer: Self-pay

## 2016-08-01 ENCOUNTER — Encounter (INDEPENDENT_AMBULATORY_CARE_PROVIDER_SITE_OTHER): Payer: Self-pay

## 2016-08-01 DIAGNOSIS — Z01812 Encounter for preprocedural laboratory examination: Secondary | ICD-10-CM | POA: Insufficient documentation

## 2016-08-01 HISTORY — DX: Unspecified osteoarthritis, unspecified site: M19.90

## 2016-08-01 HISTORY — DX: Cerebral infarction, unspecified: I63.9

## 2016-08-01 HISTORY — DX: Headache: R51

## 2016-08-01 HISTORY — DX: Headache, unspecified: R51.9

## 2016-08-01 HISTORY — DX: Gastro-esophageal reflux disease without esophagitis: K21.9

## 2016-08-01 HISTORY — DX: Other specified disorders of teeth and supporting structures: K08.89

## 2016-08-01 LAB — COMPREHENSIVE METABOLIC PANEL
ALT: 14 U/L — ABNORMAL LOW (ref 17–63)
AST: 16 U/L (ref 15–41)
Albumin: 4 g/dL (ref 3.5–5.0)
Alkaline Phosphatase: 105 U/L (ref 38–126)
Anion gap: 7 (ref 5–15)
BUN: 18 mg/dL (ref 6–20)
CO2: 26 mmol/L (ref 22–32)
Calcium: 9 mg/dL (ref 8.9–10.3)
Chloride: 108 mmol/L (ref 101–111)
Creatinine, Ser: 0.77 mg/dL (ref 0.61–1.24)
GFR calc Af Amer: >60 mL/min (ref >60)
GFR calc non Af Amer: >60 mL/min (ref >60)
Glucose, Bld: 104 mg/dL — ABNORMAL HIGH (ref 65–99)
Potassium: 4.3 mmol/L (ref 3.5–5.1)
Sodium: 141 mmol/L (ref 135–145)
Total Bilirubin: 0.6 mg/dL (ref 0.3–1.2)
Total Protein: 6.5 g/dL (ref 6.5–8.1)

## 2016-08-01 LAB — URINALYSIS, ROUTINE W REFLEX MICROSCOPIC
Bilirubin Urine: NEGATIVE
Glucose, UA: NEGATIVE mg/dL
Hgb urine dipstick: NEGATIVE
Ketones, ur: NEGATIVE mg/dL
Leukocytes, UA: NEGATIVE
Nitrite: NEGATIVE
Protein, ur: NEGATIVE mg/dL
Specific Gravity, Urine: 1.017 (ref 1.005–1.030)
pH: 6 (ref 5.0–8.0)

## 2016-08-01 LAB — ABO/RH: ABO/RH(D): O POS

## 2016-08-01 LAB — CBC
HCT: 53.9 % — ABNORMAL HIGH (ref 39.0–52.0)
Hemoglobin: 18.5 g/dL — ABNORMAL HIGH (ref 13.0–17.0)
MCH: 32.7 pg (ref 26.0–34.0)
MCHC: 34.3 g/dL (ref 30.0–36.0)
MCV: 95.2 fL (ref 78.0–100.0)
Platelets: 168 10*3/uL (ref 150–400)
RBC: 5.66 MIL/uL (ref 4.22–5.81)
RDW: 13 % (ref 11.5–15.5)
WBC: 8.4 10*3/uL (ref 4.0–10.5)

## 2016-08-01 LAB — SURGICAL PCR SCREEN
MRSA, PCR: NEGATIVE
Staphylococcus aureus: NEGATIVE

## 2016-08-01 LAB — PROTIME-INR
INR: 0.96
Prothrombin Time: 12.7 seconds (ref 11.4–15.2)

## 2016-08-01 LAB — APTT: aPTT: 28 s (ref 24–36)

## 2016-08-01 NOTE — Progress Notes (Signed)
   08/01/16 1027  OBSTRUCTIVE SLEEP APNEA  Have you ever been diagnosed with sleep apnea through a sleep study? No  Do you snore loudly (loud enough to be heard through closed doors)?  0  Do you often feel tired, fatigued, or sleepy during the daytime (such as falling asleep during driving or talking to someone)? 1  Has anyone observed you stop breathing during your sleep? 1  Do you have, or are you being treated for high blood pressure? 1  BMI more than 35 kg/m2? 1  Age > 50 (1-yes) 1  Neck circumference greater than:Male 16 inches or larger, Male 17inches or larger? 1  Male Gender (Yes=1) 1  Obstructive Sleep Apnea Score 7  Score 5 or greater  Results sent to PCP

## 2016-08-05 ENCOUNTER — Encounter (HOSPITAL_COMMUNITY): Payer: Self-pay | Admitting: Anesthesiology

## 2016-08-05 DIAGNOSIS — M169 Osteoarthritis of hip, unspecified: Secondary | ICD-10-CM | POA: Diagnosis present

## 2016-08-05 MED ORDER — DEXTROSE 5 % IV SOLN
3.0000 g | INTRAVENOUS | Status: AC
  Administered 2016-08-06: 3 g via INTRAVENOUS
  Filled 2016-08-05 (×2): qty 3000

## 2016-08-05 NOTE — Anesthesia Preprocedure Evaluation (Addendum)
Anesthesia Evaluation  Patient identified by MRN, date of birth, ID band Patient awake    Reviewed: Allergy & Precautions, NPO status , Patient's Chart, lab work & pertinent test results  Airway Mallampati: I  TM Distance: >3 FB Neck ROM: Full    Dental  (+) Poor Dentition, Loose, Missing,    Pulmonary Current Smoker,    Pulmonary exam normal breath sounds clear to auscultation       Cardiovascular hypertension, + Peripheral Vascular Disease  Normal cardiovascular exam Rhythm:Regular Rate:Normal  Hx/o ruptured cerebral aneurysm ACoA S/P endovascular stent 12/03/12   Neuro/Psych  Headaches, PSYCHIATRIC DISORDERS Depression CVA, No Residual Symptoms    GI/Hepatic GERD  Medicated and Controlled,  Endo/Other    Renal/GU   negative genitourinary   Musculoskeletal  (+) Arthritis , Osteoarthritis,  OA Left hip   Abdominal (+) + obese,   Peds  Hematology   Anesthesia Other Findings   Reproductive/Obstetrics                             Chemistry      Component Value Date/Time   NA 141 08/01/2016 1106   K 4.3 08/01/2016 1106   CL 108 08/01/2016 1106   CO2 26 08/01/2016 1106   BUN 18 08/01/2016 1106   CREATININE 0.77 08/01/2016 1106      Component Value Date/Time   CALCIUM 9.0 08/01/2016 1106   ALKPHOS 105 08/01/2016 1106   AST 16 08/01/2016 1106   ALT 14 (L) 08/01/2016 1106   BILITOT 0.6 08/01/2016 1106     Lab Results  Component Value Date   WBC 8.4 08/01/2016   HGB 18.5 (H) 08/01/2016   HCT 53.9 (H) 08/01/2016   MCV 95.2 08/01/2016   PLT 168 08/01/2016   EKG: sinus bradycardia, LAD, cannot rule out Ant MI.  Anesthesia Physical Anesthesia Plan  ASA: III  Anesthesia Plan: Spinal   Post-op Pain Management:    Induction:   Airway Management Planned: Natural Airway and Nasal Cannula  Additional Equipment:   Intra-op Plan:   Post-operative Plan:   Informed Consent: I have  reviewed the patients History and Physical, chart, labs and discussed the procedure including the risks, benefits and alternatives for the proposed anesthesia with the patient or authorized representative who has indicated his/her understanding and acceptance.   Dental advisory given  Plan Discussed with: Anesthesiologist, CRNA and Surgeon  Anesthesia Plan Comments:        Anesthesia Quick Evaluation

## 2016-08-05 NOTE — H&P (Signed)
Dennis Wyatt is an 56 y.o. male.   Chief Complaint: Left hip pain HPI: 56 yo male with a several year history of left hip pain much worse in past year. His hip hurts at all times and is now limiting what he can and can not do. He has pain with activity and at rest and the pain is now interfering with his sleep. OTC analgesics are no longer effective. He is at a stage now where the hip pain is essentially taking over his life and he presents now for left Total Hip Arthroplasty.  Past Medical History:  Diagnosis Date  . Arthritis   . GERD (gastroesophageal reflux disease)   . Headache   . Hypertension   . Stroke (HCC) 11/2012  . Tooth pain    uses BC powders for teeth pain    Past Surgical History:  Procedure Laterality Date  . RADIOLOGY WITH ANESTHESIA N/A 12/03/2012   Procedure: RADIOLOGY WITH ANESTHESIA;  Surgeon: Oneal GroutSanjeev K Deveshwar, MD;  Location: [facility] OR;  Service: Radiology;  Laterality: N/A;    Family History  Problem Relation Age of Onset  . Heart disease Mother   . Stroke Mother   . Cancer Father   . Heart disease Father   . Hypertension Father    Social History:  reports that he has been smoking Cigarettes.  He has been smoking about 1.50 packs per day. He has never used smokeless tobacco. He reports that he does not drink alcohol or use drugs.  Allergies:  Allergies  Allergen Reactions  . Oxycodone     itch    No prescriptions prior to admission.    No results found for this or any previous visit (from the past 48 hour(s)). No results found.  ROS  There were no vitals taken for this visit. Physical Exam Physical Examination: General appearance - alert, well appearing, and in no distress Mental status - alert, oriented to person, place, and time Chest - clear to auscultation, no wheezes, rales or rhonchi, symmetric air entry Heart - normal rate, regular rhythm, normal S1, S2, no murmurs, rubs, clicks or gallops Abdomen - soft, nontender, nondistended, no  masses or organomegaly Neurological - alert, oriented, normal speech, no focal findings or movement disorder noted Left hip with limited ROM; non-tender about hip; antalgic gait pattern; pulses,sensation and motor intact distally   Assessment/Plan Osteoarthritis left hip- He has end-stage arthritis left hip with progressively worsening pain and dysfunction. The most predictable means of improving pain and function is Total Hip arthroplasty. He presents for that procedure  Dennis Wyatt,[name], MD 08/05/2016, 9:50 PM

## 2016-08-06 ENCOUNTER — Encounter (HOSPITAL_COMMUNITY): Payer: Self-pay | Admitting: *Deleted

## 2016-08-06 ENCOUNTER — Inpatient Hospital Stay (HOSPITAL_COMMUNITY): Payer: PPO

## 2016-08-06 ENCOUNTER — Encounter (HOSPITAL_COMMUNITY)
Admission: RE | Disposition: A | Discharge status: Home Health | Payer: Self-pay | Source: Ambulatory Visit | Attending: Orthopedic Surgery

## 2016-08-06 ENCOUNTER — Inpatient Hospital Stay (HOSPITAL_COMMUNITY): Payer: PPO | Admitting: Anesthesiology

## 2016-08-06 ENCOUNTER — Inpatient Hospital Stay (HOSPITAL_COMMUNITY)
Admission: RE | Admit: 2016-08-06 | Discharge: 2016-08-08 | DRG: 470 | Disposition: A | Discharge status: Home Health | Payer: PPO | Source: Ambulatory Visit | Attending: Orthopedic Surgery | Admitting: Orthopedic Surgery

## 2016-08-06 DIAGNOSIS — K219 Gastro-esophageal reflux disease without esophagitis: Secondary | ICD-10-CM | POA: Diagnosis present

## 2016-08-06 DIAGNOSIS — M1612 Unilateral primary osteoarthritis, left hip: Principal | ICD-10-CM | POA: Diagnosis present

## 2016-08-06 DIAGNOSIS — Z8673 Personal history of transient ischemic attack (TIA), and cerebral infarction without residual deficits: Secondary | ICD-10-CM

## 2016-08-06 DIAGNOSIS — Z885 Allergy status to narcotic agent status: Secondary | ICD-10-CM | POA: Diagnosis not present

## 2016-08-06 DIAGNOSIS — E669 Obesity, unspecified: Secondary | ICD-10-CM | POA: Diagnosis present

## 2016-08-06 DIAGNOSIS — M549 Dorsalgia, unspecified: Secondary | ICD-10-CM | POA: Diagnosis not present

## 2016-08-06 DIAGNOSIS — E871 Hypo-osmolality and hyponatremia: Secondary | ICD-10-CM | POA: Diagnosis not present

## 2016-08-06 DIAGNOSIS — Z471 Aftercare following joint replacement surgery: Secondary | ICD-10-CM | POA: Diagnosis not present

## 2016-08-06 DIAGNOSIS — F17210 Nicotine dependence, cigarettes, uncomplicated: Secondary | ICD-10-CM | POA: Diagnosis present

## 2016-08-06 DIAGNOSIS — Z96649 Presence of unspecified artificial hip joint: Secondary | ICD-10-CM

## 2016-08-06 DIAGNOSIS — M169 Osteoarthritis of hip, unspecified: Secondary | ICD-10-CM | POA: Diagnosis present

## 2016-08-06 DIAGNOSIS — I82409 Acute embolism and thrombosis of unspecified deep veins of unspecified lower extremity: Secondary | ICD-10-CM | POA: Diagnosis not present

## 2016-08-06 DIAGNOSIS — I10 Essential (primary) hypertension: Secondary | ICD-10-CM | POA: Diagnosis present

## 2016-08-06 DIAGNOSIS — Z96642 Presence of left artificial hip joint: Secondary | ICD-10-CM | POA: Diagnosis not present

## 2016-08-06 DIAGNOSIS — Z6839 Body mass index (BMI) 39.0-39.9, adult: Secondary | ICD-10-CM | POA: Diagnosis not present

## 2016-08-06 HISTORY — PX: TOTAL HIP ARTHROPLASTY: SHX124

## 2016-08-06 LAB — TYPE AND SCREEN
ABO/RH(D): O POS
Antibody Screen: NEGATIVE

## 2016-08-06 SURGERY — ARTHROPLASTY, HIP, TOTAL, ANTERIOR APPROACH
Anesthesia: Spinal | Site: Hip | Laterality: Left

## 2016-08-06 MED ORDER — TRAMADOL HCL 50 MG PO TABS
50.0000 mg | ORAL_TABLET | Freq: Four times a day (QID) | ORAL | Status: DC | PRN
  Administered 2016-08-06: 100 mg via ORAL
  Filled 2016-08-06: qty 2

## 2016-08-06 MED ORDER — PROPOFOL 500 MG/50ML IV EMUL
INTRAVENOUS | Status: DC | PRN
  Administered 2016-08-06: 75 ug/kg/min via INTRAVENOUS
  Administered 2016-08-06: 10 ug/kg/min via INTRAVENOUS

## 2016-08-06 MED ORDER — HYDROMORPHONE HCL 2 MG PO TABS
2.0000 mg | ORAL_TABLET | ORAL | Status: DC | PRN
  Administered 2016-08-06 – 2016-08-07 (×8): 4 mg via ORAL
  Administered 2016-08-08 (×2): 2 mg via ORAL
  Filled 2016-08-06 (×2): qty 2
  Filled 2016-08-06 (×2): qty 1
  Filled 2016-08-06 (×6): qty 2

## 2016-08-06 MED ORDER — NICOTINE 14 MG/24HR TD PT24
14.0000 mg | MEDICATED_PATCH | Freq: Every day | TRANSDERMAL | Status: DC
  Administered 2016-08-06 – 2016-08-08 (×3): 14 mg via TRANSDERMAL
  Filled 2016-08-06 (×3): qty 1

## 2016-08-06 MED ORDER — RIVAROXABAN 10 MG PO TABS
10.0000 mg | ORAL_TABLET | Freq: Every day | ORAL | Status: DC
  Administered 2016-08-07 – 2016-08-08 (×2): 10 mg via ORAL
  Filled 2016-08-06 (×2): qty 1

## 2016-08-06 MED ORDER — PHENYLEPHRINE 40 MCG/ML (10ML) SYRINGE FOR IV PUSH (FOR BLOOD PRESSURE SUPPORT)
PREFILLED_SYRINGE | INTRAVENOUS | Status: DC | PRN
  Administered 2016-08-06 (×5): 80 ug via INTRAVENOUS

## 2016-08-06 MED ORDER — SODIUM CHLORIDE 0.9 % IV SOLN
INTRAVENOUS | Status: DC
  Administered 2016-08-06 – 2016-08-07 (×2): via INTRAVENOUS

## 2016-08-06 MED ORDER — LIDOCAINE 2% (20 MG/ML) 5 ML SYRINGE
INTRAMUSCULAR | Status: AC
  Filled 2016-08-06: qty 5

## 2016-08-06 MED ORDER — EPHEDRINE SULFATE-NACL 50-0.9 MG/10ML-% IV SOSY
PREFILLED_SYRINGE | INTRAVENOUS | Status: DC | PRN
  Administered 2016-08-06: 10 mg via INTRAVENOUS

## 2016-08-06 MED ORDER — ACETAMINOPHEN 325 MG PO TABS: 650.0000 mg | ORAL_TABLET | Freq: Four times a day (QID) | ORAL | Status: DC | PRN

## 2016-08-06 MED ORDER — ACETAMINOPHEN 650 MG RE SUPP: 650.0000 mg | Freq: Four times a day (QID) | RECTAL | Status: DC | PRN

## 2016-08-06 MED ORDER — CHLORHEXIDINE GLUCONATE 4 % EX LIQD: 60.0000 mL | Freq: Once | CUTANEOUS | Status: DC

## 2016-08-06 MED ORDER — MEPERIDINE HCL 50 MG/ML IJ SOLN
6.2500 mg | INTRAMUSCULAR | Status: DC | PRN
Start: 2016-08-06 — End: 2016-08-06

## 2016-08-06 MED ORDER — BISACODYL 10 MG RE SUPP: 10.0000 mg | Freq: Every day | RECTAL | Status: DC | PRN

## 2016-08-06 MED ORDER — DEXAMETHASONE SODIUM PHOSPHATE 10 MG/ML IJ SOLN
10.0000 mg | Freq: Once | INTRAMUSCULAR | Status: AC
  Administered 2016-08-07: 10 mg via INTRAVENOUS
  Filled 2016-08-06: qty 1

## 2016-08-06 MED ORDER — DIPHENHYDRAMINE HCL 12.5 MG/5ML PO ELIX: 12.5000 mg | ORAL_SOLUTION | ORAL | Status: DC | PRN

## 2016-08-06 MED ORDER — PHENYLEPHRINE HCL 10 MG/ML IJ SOLN
INTRAMUSCULAR | Status: DC | PRN
  Administered 2016-08-06: 50 ug/min via INTRAVENOUS

## 2016-08-06 MED ORDER — TRANEXAMIC ACID 1000 MG/10ML IV SOLN
2000.0000 mg | Freq: Once | INTRAVENOUS | Status: AC
  Administered 2016-08-06: 2000 mg via TOPICAL
  Filled 2016-08-06: qty 20

## 2016-08-06 MED ORDER — CEFAZOLIN SODIUM-DEXTROSE 2-4 GM/100ML-% IV SOLN
2.0000 g | Freq: Four times a day (QID) | INTRAVENOUS | Status: AC
Start: 2016-08-06 — End: 2016-08-06
  Administered 2016-08-06 (×2): 2 g via INTRAVENOUS
  Filled 2016-08-06 (×2): qty 100

## 2016-08-06 MED ORDER — ONDANSETRON HCL 4 MG/2ML IJ SOLN: 4.0000 mg | Freq: Once | INTRAMUSCULAR | Status: DC | PRN

## 2016-08-06 MED ORDER — FENTANYL CITRATE (PF) 100 MCG/2ML IJ SOLN
INTRAMUSCULAR | Status: AC
  Filled 2016-08-06: qty 2

## 2016-08-06 MED ORDER — METHOCARBAMOL 500 MG PO TABS
500.0000 mg | ORAL_TABLET | Freq: Four times a day (QID) | ORAL | Status: DC | PRN
  Administered 2016-08-06 – 2016-08-08 (×4): 500 mg via ORAL
  Filled 2016-08-06 (×4): qty 1

## 2016-08-06 MED ORDER — LACTATED RINGERS IV SOLN
INTRAVENOUS | Status: DC
  Administered 2016-08-06 (×3): via INTRAVENOUS

## 2016-08-06 MED ORDER — DEXAMETHASONE SODIUM PHOSPHATE 10 MG/ML IJ SOLN
10.0000 mg | Freq: Once | INTRAMUSCULAR | Status: AC
  Administered 2016-08-06: 10 mg via INTRAVENOUS

## 2016-08-06 MED ORDER — ONDANSETRON HCL 4 MG/2ML IJ SOLN: 4.0000 mg | Freq: Four times a day (QID) | INTRAMUSCULAR | Status: DC | PRN

## 2016-08-06 MED ORDER — MIDAZOLAM HCL 5 MG/5ML IJ SOLN
INTRAMUSCULAR | Status: DC | PRN
  Administered 2016-08-06 (×2): 1 mg via INTRAVENOUS

## 2016-08-06 MED ORDER — BUPIVACAINE IN DEXTROSE 0.75-8.25 % IT SOLN
INTRATHECAL | Status: DC | PRN
  Administered 2016-08-06: 2 mL via INTRATHECAL

## 2016-08-06 MED ORDER — DOCUSATE SODIUM 100 MG PO CAPS
100.0000 mg | ORAL_CAPSULE | Freq: Two times a day (BID) | ORAL | Status: DC
  Administered 2016-08-06 – 2016-08-08 (×4): 100 mg via ORAL
  Filled 2016-08-06 (×4): qty 1

## 2016-08-06 MED ORDER — ONDANSETRON HCL 4 MG/2ML IJ SOLN
INTRAMUSCULAR | Status: AC
  Filled 2016-08-06: qty 2

## 2016-08-06 MED ORDER — METOCLOPRAMIDE HCL 5 MG/ML IJ SOLN: 5.0000 mg | Freq: Three times a day (TID) | INTRAMUSCULAR | Status: DC | PRN

## 2016-08-06 MED ORDER — BUPIVACAINE HCL (PF) 0.25 % IJ SOLN
INTRAMUSCULAR | Status: AC
  Filled 2016-08-06: qty 30

## 2016-08-06 MED ORDER — POLYETHYLENE GLYCOL 3350 17 G PO PACK: 17.0000 g | PACK | Freq: Every day | ORAL | Status: DC | PRN

## 2016-08-06 MED ORDER — PHENOL 1.4 % MT LIQD
1.0000 | OROMUCOSAL | Status: DC | PRN
  Filled 2016-08-06: qty 177

## 2016-08-06 MED ORDER — PHENYLEPHRINE 40 MCG/ML (10ML) SYRINGE FOR IV PUSH (FOR BLOOD PRESSURE SUPPORT)
PREFILLED_SYRINGE | INTRAVENOUS | Status: AC
  Filled 2016-08-06: qty 10

## 2016-08-06 MED ORDER — ONDANSETRON HCL 4 MG PO TABS: 4.0000 mg | ORAL_TABLET | Freq: Four times a day (QID) | ORAL | Status: DC | PRN

## 2016-08-06 MED ORDER — ACETAMINOPHEN 10 MG/ML IV SOLN
INTRAVENOUS | Status: AC
  Filled 2016-08-06: qty 100

## 2016-08-06 MED ORDER — HYDROMORPHONE HCL 1 MG/ML IJ SOLN
0.5000 mg | INTRAMUSCULAR | Status: DC | PRN
  Administered 2016-08-06: 14:00:00 0.5 mg via INTRAVENOUS
  Filled 2016-08-06: qty 0.5

## 2016-08-06 MED ORDER — DEXAMETHASONE SODIUM PHOSPHATE 10 MG/ML IJ SOLN
INTRAMUSCULAR | Status: AC
  Filled 2016-08-06: qty 1

## 2016-08-06 MED ORDER — ONDANSETRON HCL 4 MG/2ML IJ SOLN
INTRAMUSCULAR | Status: DC | PRN
  Administered 2016-08-06: 4 mg via INTRAVENOUS

## 2016-08-06 MED ORDER — MIDAZOLAM HCL 2 MG/2ML IJ SOLN
INTRAMUSCULAR | Status: AC
  Filled 2016-08-06: qty 2

## 2016-08-06 MED ORDER — FENTANYL CITRATE (PF) 100 MCG/2ML IJ SOLN
INTRAMUSCULAR | Status: DC | PRN
  Administered 2016-08-06 (×2): 50 ug via INTRAVENOUS

## 2016-08-06 MED ORDER — PROPOFOL 10 MG/ML IV BOLUS
INTRAVENOUS | Status: AC
  Filled 2016-08-06: qty 80

## 2016-08-06 MED ORDER — METHOCARBAMOL 1000 MG/10ML IJ SOLN
500.0000 mg | Freq: Four times a day (QID) | INTRAVENOUS | Status: DC | PRN
  Administered 2016-08-06: 500 mg via INTRAVENOUS
  Filled 2016-08-06: qty 5
  Filled 2016-08-06: qty 550

## 2016-08-06 MED ORDER — FENTANYL CITRATE (PF) 100 MCG/2ML IJ SOLN: 25.0000 ug | INTRAMUSCULAR | Status: DC | PRN

## 2016-08-06 MED ORDER — FLEET ENEMA 7-19 GM/118ML RE ENEM: 1.0000 | ENEMA | Freq: Once | RECTAL | Status: DC | PRN

## 2016-08-06 MED ORDER — BUPIVACAINE HCL (PF) 0.25 % IJ SOLN
INTRAMUSCULAR | Status: DC | PRN
  Administered 2016-08-06: 30 mL

## 2016-08-06 MED ORDER — METOCLOPRAMIDE HCL 5 MG PO TABS: 5.0000 mg | ORAL_TABLET | Freq: Three times a day (TID) | ORAL | Status: DC | PRN

## 2016-08-06 MED ORDER — ACETAMINOPHEN 10 MG/ML IV SOLN
1000.0000 mg | Freq: Once | INTRAVENOUS | Status: AC
  Administered 2016-08-06: 1000 mg via INTRAVENOUS
  Filled 2016-08-06: qty 100

## 2016-08-06 MED ORDER — MENTHOL 3 MG MT LOZG: 1.0000 | LOZENGE | OROMUCOSAL | Status: DC | PRN

## 2016-08-06 SURGICAL SUPPLY — 36 items
BAG DECANTER FOR FLEXI CONT (MISCELLANEOUS) ×3 IMPLANT
BAG SPEC THK2 15X12 ZIP CLS (MISCELLANEOUS)
BAG ZIPLOCK 12X15 (MISCELLANEOUS) IMPLANT
BLADE SAG 18X100X1.27 (BLADE) ×3 IMPLANT
CAPT HIP TOTAL 2 ×3 IMPLANT
CLOSURE WOUND 1/2 X4 (GAUZE/BANDAGES/DRESSINGS) ×1
CLOTH BEACON ORANGE TIMEOUT ST (SAFETY) ×3 IMPLANT
COVER PERINEAL POST (MISCELLANEOUS) ×3 IMPLANT
DECANTER SPIKE VIAL GLASS SM (MISCELLANEOUS) ×3 IMPLANT
DRAPE STERI IOBAN 125X83 (DRAPES) ×3 IMPLANT
DRAPE U-SHAPE 47X51 STRL (DRAPES) ×6 IMPLANT
DRSG ADAPTIC 3X8 NADH LF (GAUZE/BANDAGES/DRESSINGS) ×3 IMPLANT
DRSG MEPILEX BORDER 4X4 (GAUZE/BANDAGES/DRESSINGS) ×3 IMPLANT
DRSG MEPILEX BORDER 4X8 (GAUZE/BANDAGES/DRESSINGS) ×3 IMPLANT
DURAPREP 26ML APPLICATOR (WOUND CARE) ×3 IMPLANT
ELECT REM PT RETURN 9FT ADLT (ELECTROSURGICAL) ×3
ELECTRODE REM PT RTRN 9FT ADLT (ELECTROSURGICAL) ×1 IMPLANT
EVACUATOR 1/8 PVC DRAIN (DRAIN) ×3 IMPLANT
GLOVE BIO SURGEON STRL SZ7.5 (GLOVE) ×3 IMPLANT
GLOVE BIO SURGEON STRL SZ8 (GLOVE) ×6 IMPLANT
GLOVE BIOGEL PI IND STRL 8 (GLOVE) ×2 IMPLANT
GLOVE BIOGEL PI INDICATOR 8 (GLOVE) ×4
GOWN STRL REUS W/TWL LRG LVL3 (GOWN DISPOSABLE) ×3 IMPLANT
GOWN STRL REUS W/TWL XL LVL3 (GOWN DISPOSABLE) ×3 IMPLANT
NS IRRIG 1000ML POUR BTL (IV SOLUTION) ×3 IMPLANT
PACK ANTERIOR HIP CUSTOM (KITS) ×3 IMPLANT
STRIP CLOSURE SKIN 1/2X4 (GAUZE/BANDAGES/DRESSINGS) ×2 IMPLANT
SUT ETHIBOND NAB CT1 #1 30IN (SUTURE) ×3 IMPLANT
SUT MNCRL AB 4-0 PS2 18 (SUTURE) ×3 IMPLANT
SUT VIC AB 2-0 CT1 27 (SUTURE) ×6
SUT VIC AB 2-0 CT1 TAPERPNT 27 (SUTURE) ×2 IMPLANT
SUT VLOC 180 0 24IN GS25 (SUTURE) ×3 IMPLANT
SYR 50ML LL SCALE MARK (SYRINGE) IMPLANT
TRAY FOLEY W/METER SILVER 16FR (SET/KITS/TRAYS/PACK) ×3 IMPLANT
WATER STERILE IRR 1000ML POUR (IV SOLUTION) ×6 IMPLANT
YANKAUER SUCT BULB TIP 10FT TU (MISCELLANEOUS) ×3 IMPLANT

## 2016-08-06 NOTE — Progress Notes (Signed)
Valuables locked in security. Key placed on patient's paper chart.

## 2016-08-06 NOTE — Op Note (Signed)
OPERATIVE REPORT- TOTAL HIP ARTHROPLASTY   PREOPERATIVE DIAGNOSIS: Osteoarthritis of the Left hip.   POSTOPERATIVE DIAGNOSIS: Osteoarthritis of the Left  hip.   PROCEDURE: Left total hip arthroplasty, anterior approach.   SURGEON: Ollen GrossFrank Gregoria Selvy, MD   ASSISTANT: Avel Peacerew Perkins, PA-C  ANESTHESIA:  Spinal  ESTIMATED BLOOD LOSS:-750 ml    DRAINS: Hemovac x1.   COMPLICATIONS: None   CONDITION: PACU - hemodynamically stable.   BRIEF CLINICAL NOTE: Dennis Wyatt is a 56 y.o. male who has advanced end-  stage arthritis of their Left  hip with progressively worsening pain and  dysfunction.The patient has failed nonoperative management and presents for  total hip arthroplasty.   PROCEDURE IN DETAIL: After successful administration of spinal  anesthetic, the traction boots for the [facility] bed were placed on both  feet and the patient was [facility] the [facility] bed, boots placed into the leg  holders. The Left hip was then isolated from the perineum with plastic  drapes and prepped and draped in the usual sterile fashion. ASIS and  greater trochanter were marked and a oblique incision was made, starting  at about 1 cm lateral and 2 cm distal to the ASIS and coursing towards  the anterior cortex of the femur. The skin was cut with a 10 blade  through subcutaneous tissue to the level of the fascia overlying the  tensor fascia lata muscle. The fascia was then incised in line with the  incision at the junction of the anterior third and posterior 2/3rd. The  muscle was teased off the fascia and then the interval between the TFL  and the rectus was developed. The Hohmann retractor was then placed at  the top of the femoral neck over the capsule. The vessels overlying the  capsule were cauterized and the fat on top of the capsule was removed.  A Hohmann retractor was then placed anterior underneath the rectus  femoris to give exposure to the entire anterior capsule. A T-shaped  capsulotomy  was performed. The edges were tagged and the femoral head  was identified.       Osteophytes are removed off the superior acetabulum.  The femoral neck was then cut in situ with an oscillating saw. Traction  was then applied to the left lower extremity utilizing the [facility]  traction. The femoral head was then removed. Retractors were placed  around the acetabulum and then circumferential removal of the labrum was  performed. Osteophytes were also removed. Reaming starts at 47 mm to  medialize and  Increased in 2 mm increments to 53 mm. We reamed in  approximately 40 degrees of abduction, 20 degrees anteversion. A 54 mm  pinnacle acetabular shell was then impacted in anatomic position under  fluoroscopic guidance with excellent purchase. We did not need to place  any additional dome screws. A 36 mm neutral + 4 marathon liner was then  placed into the acetabular shell.       The femoral lift was then placed along the lateral aspect of the femur  just distal to the vastus ridge. The leg was  externally rotated and capsule  was stripped off the inferior aspect of the femoral neck down to the  level of the lesser trochanter, this was done with electrocautery. The femur was lifted after this was performed. The  leg was then placed in an extended and adducted position essentially delivering the femur. We also removed the capsule superiorly and the piriformis from the piriformis fossa  to gain excellent exposure of the  proximal femur. Rongeur was used to remove some cancellous bone to get  into the lateral portion of the proximal femur for placement of the  initial starter reamer. The starter broaches was placed  the starter broach  and was shown to go down the center of the canal. Broaching  with the  Corail system was then performed starting at size 8, coursing  Up to size 14. A size 14 had excellent torsional and rotational  and axial stability. The trial high offset neck was then placed  with a 36  + 12 trial head. We needed the large head as his left leg was significantly shorter then his right pre-operatively. The hip was then reduced. We confirmed that  the stem was in the canal both on AP and lateral x-rays. It also has excellent sizing. The hip was reduced with outstanding stability through full extension and full external rotation.. AP pelvis was taken and the leg lengths were measured and found to be equal. Hip was then dislocated again and the femoral head and neck removed. The  femoral broach was removed. Size 14 Corail stem with a collarless high offset  neck was then impacted into the femur following native anteversion. Has  excellent purchase in the canal. Excellent torsional and rotational and  axial stability. It is confirmed to be in the canal on AP and lateral  fluoroscopic views. The 36 + 12 ceramic head was placed and the hip  reduced with outstanding stability. Again AP pelvis was taken and it  confirmed that the leg lengths were equal. The wound was then copiously  irrigated with saline solution and the capsule reattached and repaired  with Ethibond suture. 30 ml of .25% Bupivicaine was  injected into the capsule and into the edge of the tensor fascia lata as well as subcutaneous tissue. The fascia overlying the tensor fascia lata was then closed with a running #1 V-Loc. Subcu was closed with interrupted 2-0 Vicryl and subcuticular running 4-0 Monocryl. Incision was cleaned  and dried. Steri-Strips and a bulky sterile dressing applied. Hemovac  drain was hooked to suction and then the patient was awakened and transported to  recovery in stable condition.        Please note that a surgical assistant was a medical necessity for this procedure to perform it in a safe and expeditious manner. Assistant was necessary to provide appropriate retraction of vital neurovascular structures and to prevent femoral fracture and allow for anatomic placement of the prosthesis.  Ollen Gross, M.D.

## 2016-08-06 NOTE — Transfer of Care (Signed)
Immediate Anesthesia Transfer of Care Note  Patient: Dennis Wyatt  Procedure(s) Performed: Procedure(s): LEFT TOTAL HIP ARTHROPLASTY ANTERIOR APPROACH (Left)  Patient Location: PACU  Anesthesia Type:Spinal  Level of Consciousness:  sedated, patient cooperative and responds to stimulation  Airway & Oxygen Therapy:Patient Spontanous Breathing and Patient connected to face mask oxgen  Post-op Assessment:  Report given to PACU RN and Post -op Vital signs reviewed and stable  Post vital signs:  Reviewed and stable  Last Vitals:  Vitals:   08/06/16 0542  BP: (!) 154/88  Pulse: 86  Resp: 16  Temp: 36.4 C    Complications: No apparent anesthesia complications

## 2016-08-06 NOTE — Anesthesia Procedure Notes (Signed)
Spinal  Patient location during procedure: OR Start time: 08/06/2016 7:28 AM Staffing Anesthesiologist: Mal AmabileFOSTER, Bina Veenstra Performed: anesthesiologist  Preanesthetic Checklist Completed: patient identified, site marked, surgical consent, pre-op evaluation, timeout performed, IV checked, risks and benefits discussed and monitors and equipment checked Spinal Block Patient position: sitting Prep: site prepped and draped and DuraPrep Patient monitoring: heart rate, cardiac monitor, continuous pulse ox and blood pressure Approach: midline Location: L3-4 Injection technique: single-shot Needle Needle type: Pencan  Needle gauge: 24 G Needle length: 9 cm Needle insertion depth: 7 cm Assessment Sensory level: T4 Additional Notes Patient tolerated procedure well. Adequate sensory level.

## 2016-08-06 NOTE — Anesthesia Postprocedure Evaluation (Signed)
Anesthesia Post Note  Patient: Dennis Wyatt  Procedure(s) Performed: Procedure(s) (LRB): LEFT TOTAL HIP ARTHROPLASTY ANTERIOR APPROACH (Left)  Patient location during evaluation: PACU Anesthesia Type: Spinal Level of consciousness: oriented and awake and alert Pain management: pain level controlled Vital Signs Assessment: post-procedure vital signs reviewed and stable Respiratory status: spontaneous breathing, respiratory function stable, nonlabored ventilation and patient connected to nasal cannula oxygen Cardiovascular status: blood pressure returned to baseline and stable Postop Assessment: no headache, no backache, spinal receding, no signs of nausea or vomiting and patient able to bend at knees Anesthetic complications: no       Last Vitals:  Vitals:   08/06/16 1015 08/06/16 1030  BP: 123/70 121/75  Pulse: 70 61  Resp: 13 11  Temp:      Last Pain:  Vitals:   08/06/16 1015  TempSrc:   PainSc: 0-No pain                 Dennis Wyatt A.

## 2016-08-06 NOTE — Evaluation (Signed)
Physical Therapy Evaluation Patient Details Name: Dennis Wyatt MRN: [id number] DOB: 11-15-1960 Today's Date: 08/06/2016   History of Present Illness  Pt s/p L THR and with hx of CVA (14)  Clinical Impression  Pt s/p L THR presents with decreased L LE strength/ROM and post op pain limiting functional mobility.  Pt hopes to progress to dc home with ltd assist of family and follow up HHPT.    Follow Up Recommendations Home health PT    Equipment Recommendations  None recommended by PT    Recommendations for Other Services OT consult     Precautions / Restrictions Precautions Precautions: Fall Restrictions Weight Bearing Restrictions: No Other Position/Activity Restrictions: WBAT      Mobility  Bed Mobility Overal bed mobility: Needs Assistance Bed Mobility: Supine to Sit     Supine to sit: Min assist     General bed mobility comments: cues for sequence and use of R LE to self assist  Transfers Overall transfer level: Needs assistance Equipment used: Rolling walker (2 wheeled) Transfers: Sit to/from Stand Sit to Stand: Min assist         General transfer comment: cues for LE management and use of UEs to self assist  Ambulation/Gait Ambulation/Gait assistance: Min assist Ambulation Distance (Feet): 35 Feet Assistive device: Rolling walker (2 wheeled) Gait Pattern/deviations: Step-to pattern;Decreased step length - right;Decreased step length - left;Shuffle;Trunk flexed Gait velocity: decr Gait velocity interpretation: Below normal speed for age/gender General Gait Details: cues for seqence, posture and position from [employer]            Wheelchair Mobility    Modified Rankin (Stroke Patients Only)       Balance Overall balance assessment: No apparent balance deficits (not formally assessed)                                           Pertinent Vitals/Pain Pain Assessment: 0-10 Pain Score: 6  Pain Location: L hip Pain  Descriptors / Indicators: Aching;Sore Pain Intervention(s): Limited activity within patient's tolerance;Monitored during session;Premedicated before session    Home Living Family/patient expects to be discharged to:: Private residence Living Arrangements: Alone Available Help at Discharge: Family Type of Home: House Home Access: Stairs to enter Entrance Stairs-Rails: Right Entrance Stairs-Number of Steps: 2 Home Layout: One level Home Equipment: Environmental consultant - 2 wheels;Cane - single point Additional Comments: Pt states family is very limited in ability to assist    Prior Function Level of Independence: Independent with assistive device(s)   Gait / Transfers Assistance Needed: Pt used cane as needed           Hand Dominance        Extremity/Trunk Assessment   Upper Extremity Assessment Upper Extremity Assessment: Overall WFL for tasks assessed    Lower Extremity Assessment Lower Extremity Assessment: LLE deficits/detail    Cervical / Trunk Assessment Cervical / Trunk Assessment: Normal  Communication   Communication: No difficulties  Cognition Arousal/Alertness: Awake/alert Behavior During Therapy: WFL for tasks assessed/performed                        General Comments      Exercises Total Joint Exercises Ankle Circles/Pumps: AROM;Both;15 reps;Supine   Assessment/Plan    PT Assessment Patient needs continued PT services  PT Problem List Decreased strength;Decreased range of motion;Decreased activity tolerance;Decreased mobility;Decreased knowledge of use  of DME;Obesity;Pain          PT Treatment Interventions DME instruction;Gait training;Stair training;Functional mobility training;Therapeutic activities;Therapeutic exercise;Patient/family education    PT Goals (Current goals can be found in the Care Plan section)  Acute Rehab PT Goals Patient Stated Goal: Regain IND and walk without pain PT Goal Formulation: With patient Time For Goal  Achievement: 08/09/16 Potential to Achieve Goals: Good    Frequency 7X/week   Barriers to discharge Decreased caregiver support Lives alone and family very limited in ability to assist    Co-evaluation               End of Session Equipment Utilized During Treatment: Gait belt Activity Tolerance: Patient tolerated treatment well Patient left: in chair;with call bell/phone within reach Nurse Communication: Mobility status         Time: [id number] PT Time Calculation (min) (ACUTE ONLY): 31 min   Charges:   PT Evaluation $PT Eval Low Complexity: 1 Procedure PT Treatments $Gait Training: 8-22 mins   PT G Codes:        Dennis Wyatt 08/06/2016, 5:23 PM

## 2016-08-07 LAB — BASIC METABOLIC PANEL
Anion gap: 6 (ref 5–15)
BUN: 23 mg/dL — ABNORMAL HIGH (ref 6–20)
CO2: 28 mmol/L (ref 22–32)
Calcium: 8.3 mg/dL — ABNORMAL LOW (ref 8.9–10.3)
Chloride: 106 mmol/L (ref 101–111)
Creatinine, Ser: 0.62 mg/dL (ref 0.61–1.24)
GFR calc Af Amer: >60 mL/min (ref >60)
GFR calc non Af Amer: >60 mL/min (ref >60)
Glucose, Bld: 142 mg/dL — ABNORMAL HIGH (ref 65–99)
Potassium: 4.2 mmol/L (ref 3.5–5.1)
Sodium: 140 mmol/L (ref 135–145)

## 2016-08-07 LAB — CBC
HCT: 45.6 % (ref 39.0–52.0)
Hemoglobin: 15.3 g/dL (ref 13.0–17.0)
MCH: 32.2 pg (ref 26.0–34.0)
MCHC: 33.6 g/dL (ref 30.0–36.0)
MCV: 96 fL (ref 78.0–100.0)
Platelets: 142 10*3/uL — ABNORMAL LOW (ref 150–400)
RBC: 4.75 MIL/uL (ref 4.22–5.81)
RDW: 13 % (ref 11.5–15.5)
WBC: 15.9 10*3/uL — ABNORMAL HIGH (ref 4.0–10.5)

## 2016-08-07 NOTE — Progress Notes (Signed)
Physical Therapy Treatment Patient Details Name: Dennis HowDennis Wyatt MRN: [id number] DOB: Jul 30, 1960 Today's Date: 08/07/2016    History of Present Illness Pt s/p L THR and with hx of CVA (14)    PT Comments    Steady progress with mobility.  Pt hopeful for dc home tomorrow.  Follow Up Recommendations  Home health PT     Equipment Recommendations  None recommended by PT    Recommendations for Other Services OT consult     Precautions / Restrictions Precautions Precautions: Fall Restrictions Weight Bearing Restrictions: No Other Position/Activity Restrictions: WBAT    Mobility  Bed Mobility Overal bed mobility: Needs Assistance Bed Mobility: Supine to Sit     Supine to sit: Min assist     General bed mobility comments: Pt OOB and declines back to bed  Transfers Overall transfer level: Needs assistance Equipment used: Rolling walker (2 wheeled) Transfers: Sit to/from Stand Sit to Stand: Min guard Stand pivot transfers: Min assist       General transfer comment: cues for LE management and use of UEs to self assist  Ambulation/Gait Ambulation/Gait assistance: Min guard Ambulation Distance (Feet): 123 Feet Assistive device: Rolling walker (2 wheeled) Gait Pattern/deviations: Step-to pattern;Decreased step length - right;Decreased step length - left;Shuffle;Trunk flexed Gait velocity: decr Gait velocity interpretation: Below normal speed for age/gender General Gait Details: cues for seqence, posture and position from [employer]   Stairs            Wheelchair Mobility    Modified Rankin (Stroke Patients Only)       Balance Overall balance assessment: No apparent balance deficits (not formally assessed)                                  Cognition Arousal/Alertness: Awake/alert Behavior During Therapy: WFL for tasks assessed/performed Overall Cognitive Status: Within Functional Limits for tasks assessed                      Exercises  Total Joint Exercises Ankle Circles/Pumps: AROM;Both;15 reps;Supine Quad Sets: AROM;Both;10 reps;Supine Heel Slides: AAROM;Left;20 reps;Supine Hip ABduction/ADduction: AAROM;Left;15 reps;Supine    General Comments        Pertinent Vitals/Pain Pain Assessment: 0-10 Pain Score: 6  Pain Location: L hip Pain Descriptors / Indicators: Aching;Sore Pain Intervention(s): Limited activity within patient's tolerance;Monitored during session;Premedicated before session    Home Living Family/patient expects to be discharged to:: Private residence Living Arrangements: Alone Available Help at Discharge: Family Type of Home: House Home Access: Stairs to enter Entrance Stairs-Rails: Right Home Layout: One level Home Equipment: Environmental consultantWalker - 2 wheels;Cane - single point Additional Comments: Pt states family is very limited in ability to assist    Prior Function Level of Independence: Independent with assistive device(s)  Gait / Transfers Assistance Needed: Pt used cane as needed       PT Goals (current goals can now be found in the care plan section) Acute Rehab PT Goals Patient Stated Goal: Regain IND and walk without pain PT Goal Formulation: With patient Time For Goal Achievement: 08/09/16 Potential to Achieve Goals: Good Progress towards PT goals: Progressing toward goals    Frequency    7X/week      PT Plan Current plan remains appropriate    Co-evaluation             End of Session Equipment Utilized During Treatment: Gait belt Activity Tolerance: Patient tolerated treatment well Patient  left: in chair;with call bell/phone within reach     Time: 1345-1404 PT Time Calculation (min) (ACUTE ONLY): 19 min  Charges:  $Gait Training: 8-22 mins $Therapeutic Exercise: 8-22 mins                    G Codes:      Dennis Wyatt 09-03-16, 2:19 PM

## 2016-08-07 NOTE — Evaluation (Signed)
Occupational Therapy Evaluation Patient Details Name: Dennis Wyatt MRN: [id number] DOB: 02-05-1961 Today's Date: 08/07/2016    History of Present Illness Pt s/p L THR and with hx of CVA (14)   Clinical Impression   Pt is s/p THA resulting in the deficits listed below (see OT Problem List). Pt will benefit from skilled OT to increase their safety and independence with ADL and functional mobility for ADL to facilitate discharge to venue listed below.       Follow Up Recommendations  Home health OT;Supervision - Intermittent    Equipment Recommendations  3 in 1 bedside commode       Precautions / Restrictions Precautions Precautions: Fall Restrictions Weight Bearing Restrictions: No Other Position/Activity Restrictions: WBAT      Mobility Bed Mobility Overal bed mobility: Needs Assistance Bed Mobility: Supine to Sit     Supine to sit: Min assist     General bed mobility comments: cues for sequence and use of R LE to self assist  Transfers Overall transfer level: Needs assistance Equipment used: Rolling walker (2 wheeled) Transfers: Sit to/from UGI CorporationStand;Stand Pivot Transfers Sit to Stand: Min assist Stand pivot transfers: Min assist       General transfer comment: cues for LE management and use of UEs to self assist    Balance Overall balance assessment: No apparent balance deficits (not formally assessed)                                          ADL Overall ADL's : Needs assistance/impaired Eating/Feeding: Sitting;Independent   Grooming: Standing;Min guard   Upper Body Bathing: Set up;Sitting   Lower Body Bathing: Moderate assistance;Sit to/from stand;Cueing for safety;Cueing for sequencing   Upper Body Dressing : Set up;Sitting   Lower Body Dressing: Moderate assistance;Sit to/from stand;Cueing for safety;Cueing for sequencing   Toilet Transfer: Minimal assistance;RW;Ambulation;Cueing for sequencing;Cueing for safety;Comfort height  toilet   Toileting- Clothing Manipulation and Hygiene: Minimal assistance;Sit to/from stand;Cueing for sequencing       Functional mobility during ADLs: Minimal assistance General ADL Comments: Pts sock aid at home missing sticker- OT provided. Pt also needs a walker bag- stated he would obtain     Vision     Perception     Praxis      Pertinent Vitals/Pain Pain Assessment: 0-10 Pain Score: 5  Pain Location: L hip with standing to brush teeth Pain Descriptors / Indicators: Aching Pain Intervention(s): Monitored during session;Repositioned     Hand Dominance     Extremity/Trunk Assessment Upper Extremity Assessment Upper Extremity Assessment: Overall WFL for tasks assessed       Cervical / Trunk Assessment Cervical / Trunk Assessment: Normal   Communication Communication Communication: No difficulties   Cognition Arousal/Alertness: Awake/alert Behavior During Therapy: WFL for tasks assessed/performed Overall Cognitive Status: Within Functional Limits for tasks assessed                     General Comments       Exercises Exercises: Total Joint     Shoulder Instructions      Home Living Family/patient expects to be discharged to:: Private residence Living Arrangements: Alone Available Help at Discharge: Family Type of Home: House Home Access: Stairs to enter Enter[employer] CorporationEntrance Stairs-Number of Steps: 2 Entrance Stairs-Rails: Right Home Layout: One level     Bathroom Shower/Tub: Tub/shower unit  Home Equipment: Walker - 2 wheels;Cane - single point   Additional Comments: Pt states family is very limited in ability to assist      Prior Functioning/Environment Level of Independence: Independent with assistive device(s)  Gait / Transfers Assistance Needed: Pt used cane as needed              OT Problem List: Decreased strength;Decreased activity tolerance;Decreased knowledge of use of DME or AE   OT Treatment/Interventions:  Self-care/ADL training;DME and/or AE instruction    OT Goals(Current goals can be found in the care plan section) Acute Rehab OT Goals Patient Stated Goal: Regain IND and walk without pain OT Goal Formulation: With patient Time For Goal Achievement: 08/21/16  OT Frequency: Min 2X/week   Barriers to D/C: Decreased caregiver support          Co-evaluation              End of Session Equipment Utilized During Treatment: Rolling walker;Gait belt Nurse Communication: Mobility status  Activity Tolerance: Patient tolerated treatment well Patient left: in chair;with call bell/phone within reach;with chair alarm set   Time: 1057-1131 OT Time Calculation (min): 34 min Charges:  OT General Charges $OT Visit: 1 Procedure OT Evaluation $OT Eval Moderate Complexity: 1 Procedure OT Treatments $Self Care/Home Management : 8-22 mins G-Codes:    Einar Crow D August 12, 2016, 12:40 PM

## 2016-08-07 NOTE — Care Management Note (Signed)
Case Management Note  Patient Details  Name: Gunther Zawadzki MRN: [id number] Date of Birth: Nov 24, 1960  Subjective/Objective:                  LEFT TOTAL HIP ARTHROPLASTY ANTERIOR APPROACH (Left) Action/Plan: Discharge planning Expected Discharge Date:                  Expected Discharge Plan:  [place]  In-House Referral:     Discharge planning Services  CM Consult  Post Acute Care Choice:  Home Health Choice offered to:  Patient  DME Arranged:  N/A DME Agency:  NA  HH Arranged:  PT [place] Agency:  [facility] ([facility]  Status of Service:  Completed, signed off  If discussed at [employer], dates discussed:    Additional Comments: CM met with pt in room to offer choice of home health agency. Pt chooses [facility] to render HHPT. CM has requested HHPT order and face to face.  Pt states he has all DME needed at home. No other CM needs were communicated. Dellie Catholic, RN 08/07/2016, 12:17 PM

## 2016-08-07 NOTE — Progress Notes (Signed)
   Subjective: 1 Day Post-Op Procedure(s) (LRB): LEFT TOTAL HIP ARTHROPLASTY ANTERIOR APPROACH (Left) Patient reports pain as mild.   Patient seen in rounds by Dr. Lequita HaltAluisio. Patient is well, but has had some minor complaints of pain in the hip, requiring pain medications We will start therapy today.  Plan is to go Home after hospital stay.  Objective: Vital signs in last 24 hours: Temp:  [97.5 F (36.4 C)-97.9 F (36.6 C)] 97.9 F (36.6 C) (01/25 0650) Pulse Rate:  [61-96] 96 (01/25 0650) Resp:  [11-16] 16 (01/25 0650) BP: (116-154)/(63-85) 132/74 (01/25 0650) SpO2:  [94 %-100 %] 95 % (01/25 0650)  Intake/Output from previous day:  Intake/Output Summary (Last 24 hours) at 08/07/16 0853 Last data filed at 08/07/16 0825  Gross per 24 hour  Intake             4710 ml  Output             2312 ml  Net             2398 ml    Intake/Output this shift: Total I/O In: 120 [P.O.:120] Out: -   Labs:  Recent Labs  08/07/16 0504  HGB 15.3    Recent Labs  08/07/16 0504  WBC 15.9*  RBC 4.75  HCT 45.6  PLT 142*    Recent Labs  08/07/16 0504  NA 140  K 4.2  CL 106  CO2 28  BUN 23*  CREATININE 0.62  GLUCOSE 142*  CALCIUM 8.3*   No results for input(s): LABPT, INR in the last 72 hours.  EXAM General - Patient is Alert, Appropriate and Oriented Extremity - Neurovascular intact Sensation intact distally Dorsiflexion/Plantar flexion intact Dressing - dressing C/D/I Motor Function - intact, moving foot and toes well on exam.  Hemovac pulled without difficulty.  Past Medical History:  Diagnosis Date  . Arthritis   . GERD (gastroesophageal reflux disease)   . Headache   . Hypertension   . Stroke ([facility]) 11/2012  . Tooth pain    uses BC powders for teeth pain    Assessment/Plan: 1 Day Post-Op Procedure(s) (LRB): LEFT TOTAL HIP ARTHROPLASTY ANTERIOR APPROACH (Left) Principal Problem:   OA (osteoarthritis) of hip  Estimated body mass index is 39.86 kg/m as  calculated from the following:   Height as of this encounter: 5\' 11"  (1.803 m).   Weight as of this encounter: 129.6 kg (285 lb 12.8 oz). Advance diet Up with therapy Plan for discharge tomorrow Discharge home with home health  DVT Prophylaxis - Xarelto Weight Bearing As Tolerated left Leg Hemovac Pulled Begin Therapy  Avel Peacerew Perkins, PA-C Orthopaedic Surgery 08/07/2016, 8:53 AM

## 2016-08-07 NOTE — Progress Notes (Signed)
Physical Therapy Treatment Patient Details Name: Dennis Wyatt MRN: [id number] DOB: Dec 23, 1960 Today's Date: 08/07/2016    History of Present Illness Pt s/p L THR and with hx of CVA (14)    PT Comments    Pt very motivated and progressing well with mobility.  Follow Up Recommendations  Home health PT     Equipment Recommendations  None recommended by PT    Recommendations for Other Services OT consult     Precautions / Restrictions Precautions Precautions: Fall Restrictions Weight Bearing Restrictions: No Other Position/Activity Restrictions: WBAT    Mobility  Bed Mobility Overal bed mobility: Needs Assistance Bed Mobility: Supine to Sit     Supine to sit: Min assist     General bed mobility comments: cues for sequence and use of R LE to self assist  Transfers Overall transfer level: Needs assistance Equipment used: Rolling walker (2 wheeled) Transfers: Sit to/from Stand Sit to Stand: Min guard         General transfer comment: cues for LE management and use of UEs to self assist  Ambulation/Gait Ambulation/Gait assistance: Min assist;Min guard Ambulation Distance (Feet): 123 Feet Assistive device: Rolling walker (2 wheeled) Gait Pattern/deviations: Step-to pattern;Decreased step length - right;Decreased step length - left;Shuffle;Trunk flexed Gait velocity: decr Gait velocity interpretation: Below normal speed for age/gender General Gait Details: cues for seqence, posture and position from [employer]            Wheelchair Mobility    Modified Rankin (Stroke Patients Only)       Balance Overall balance assessment: No apparent balance deficits (not formally assessed)                                  Cognition Arousal/Alertness: Awake/alert Behavior During Therapy: WFL for tasks assessed/performed Overall Cognitive Status: Within Functional Limits for tasks assessed                      Exercises Total Joint  Exercises Ankle Circles/Pumps: AROM;Both;15 reps;Supine Quad Sets: AROM;Both;10 reps;Supine Heel Slides: AAROM;Left;20 reps;Supine Hip ABduction/ADduction: AAROM;Left;15 reps;Supine    General Comments        Pertinent Vitals/Pain Pain Assessment: 0-10 Pain Score: 5  Pain Location: L hip Pain Descriptors / Indicators: Aching;Sore Pain Intervention(s): Limited activity within patient's tolerance;Monitored during session;Premedicated before session;Ice applied    Home Living                      Prior Function            PT Goals (current goals can now be found in the care plan section) Acute Rehab PT Goals Patient Stated Goal: Regain IND and walk without pain PT Goal Formulation: With patient Time For Goal Achievement: 08/09/16 Potential to Achieve Goals: Good Progress towards PT goals: Progressing toward goals    Frequency    7X/week      PT Plan Current plan remains appropriate    Co-evaluation             End of Session Equipment Utilized During Treatment: Gait belt Activity Tolerance: Patient tolerated treatment well Patient left: in chair;with call bell/phone within reach     Time: 0819-0855 PT Time Calculation (min) (ACUTE ONLY): 36 min  Charges:  $Gait Training: 8-22 mins $Therapeutic Exercise: 8-22 mins  G Codes:      Jeanpaul Biehl 08/07/2016, 11:45 AM

## 2016-08-08 LAB — CBC
HCT: 46.1 % (ref 39.0–52.0)
Hemoglobin: 15.6 g/dL (ref 13.0–17.0)
MCH: 32.4 pg (ref 26.0–34.0)
MCHC: 33.8 g/dL (ref 30.0–36.0)
MCV: 95.8 fL (ref 78.0–100.0)
Platelets: 173 10*3/uL (ref 150–400)
RBC: 4.81 MIL/uL (ref 4.22–5.81)
RDW: 12.9 % (ref 11.5–15.5)
WBC: 17.8 10*3/uL — ABNORMAL HIGH (ref 4.0–10.5)

## 2016-08-08 LAB — BASIC METABOLIC PANEL
Anion gap: 5 (ref 5–15)
BUN: 21 mg/dL — ABNORMAL HIGH (ref 6–20)
CO2: 31 mmol/L (ref 22–32)
Calcium: 8.9 mg/dL (ref 8.9–10.3)
Chloride: 104 mmol/L (ref 101–111)
Creatinine, Ser: 0.65 mg/dL (ref 0.61–1.24)
GFR calc Af Amer: >60 mL/min (ref >60)
GFR calc non Af Amer: >60 mL/min (ref >60)
Glucose, Bld: 113 mg/dL — ABNORMAL HIGH (ref 65–99)
Potassium: 4.1 mmol/L (ref 3.5–5.1)
Sodium: 140 mmol/L (ref 135–145)

## 2016-08-08 MED ORDER — HYDROMORPHONE HCL 2 MG PO TABS: 2.0000 mg | ORAL_TABLET | ORAL | 0 refills | Status: DC | PRN

## 2016-08-08 MED ORDER — TRAMADOL HCL 50 MG PO TABS: 50.0000 mg | ORAL_TABLET | Freq: Four times a day (QID) | ORAL | 0 refills | Status: DC | PRN

## 2016-08-08 MED ORDER — RIVAROXABAN 10 MG PO TABS: 10.0000 mg | ORAL_TABLET | Freq: Every day | ORAL | 0 refills | Status: DC

## 2016-08-08 MED ORDER — METHOCARBAMOL 500 MG PO TABS: 500.0000 mg | ORAL_TABLET | Freq: Four times a day (QID) | ORAL | 0 refills | Status: DC | PRN

## 2016-08-08 NOTE — Discharge Summary (Signed)
Physician Discharge Summary   Patient ID: Dennis Wyatt MRN: [id number] DOB/AGE: 14-Apr-1961 56 y.o.  Admit date: 08/06/2016 Discharge date: 08-08-2016  Primary Diagnosis:  Osteoarthritis of the Left hip.   Admission Diagnoses:  Past Medical History:  Diagnosis Date  . Arthritis   . GERD (gastroesophageal reflux disease)   . Headache   . Hypertension   . Stroke ([place]) 11/2012  . Tooth pain    uses BC powders for teeth pain   Discharge Diagnoses:   Principal Problem:   OA (osteoarthritis) of hip  Estimated body mass index is 39.86 kg/m as calculated from the following:   Height as of this encounter: '5\' 11"'  (1.803 m).   Weight as of this encounter: 129.6 kg (285 lb 12.8 oz).  Procedure(s) (LRB): LEFT TOTAL HIP ARTHROPLASTY ANTERIOR APPROACH (Left)   Consults: None  HPI: Dennis Wyatt is a 56 y.o. male who has advanced end-  stage arthritis of their Left  hip with progressively worsening pain and  dysfunction.The patient has failed nonoperative management and presents for  total hip arthroplasty.   Laboratory Data: Admission on 08/06/2016  Component Date Value Ref Range Status  . WBC 08/07/2016 15.9* 4.0 - 10.5 K/uL Final  . RBC 08/07/2016 4.75  4.22 - 5.81 MIL/uL Final  . Hemoglobin 08/07/2016 15.3  13.0 - 17.0 g/dL Final  . HCT 08/07/2016 45.6  39.0 - 52.0 % Final  . MCV 08/07/2016 96.0  78.0 - 100.0 fL Final  . MCH 08/07/2016 32.2  26.0 - 34.0 pg Final  . MCHC 08/07/2016 33.6  30.0 - 36.0 g/dL Final  . RDW 08/07/2016 13.0  11.5 - 15.5 % Final  . Platelets 08/07/2016 142* 150 - 400 K/uL Final  . Sodium 08/07/2016 140  135 - 145 mmol/L Final  . Potassium 08/07/2016 4.2  3.5 - 5.1 mmol/L Final  . Chloride 08/07/2016 106  101 - 111 mmol/L Final  . CO2 08/07/2016 28  22 - 32 mmol/L Final  . Glucose, Bld 08/07/2016 142* 65 - 99 mg/dL Final  . BUN 08/07/2016 23* 6 - 20 mg/dL Final  . Creatinine, Ser 08/07/2016 0.62  0.61 - 1.24 mg/dL Final  . Calcium 08/07/2016 8.3*  8.9 - 10.3 mg/dL Final  . GFR calc non Af Amer 08/07/2016 >60  >60 mL/min Final  . GFR calc Af Amer 08/07/2016 >60  >60 mL/min Final   Comment: (NOTE) The eGFR has been calculated using the CKD EPI equation. This calculation has not been validated in all clinical situations. eGFR's persistently <60 mL/min signify possible Chronic Kidney Disease.   . Anion gap 08/07/2016 6  5 - 15 Final  . WBC 08/08/2016 17.8* 4.0 - 10.5 K/uL Final  . RBC 08/08/2016 4.81  4.22 - 5.81 MIL/uL Final  . Hemoglobin 08/08/2016 15.6  13.0 - 17.0 g/dL Final  . HCT 08/08/2016 46.1  39.0 - 52.0 % Final  . MCV 08/08/2016 95.8  78.0 - 100.0 fL Final  . MCH 08/08/2016 32.4  26.0 - 34.0 pg Final  . MCHC 08/08/2016 33.8  30.0 - 36.0 g/dL Final  . RDW 08/08/2016 12.9  11.5 - 15.5 % Final  . Platelets 08/08/2016 173  150 - 400 K/uL Final  . Sodium 08/08/2016 140  135 - 145 mmol/L Final  . Potassium 08/08/2016 4.1  3.5 - 5.1 mmol/L Final  . Chloride 08/08/2016 104  101 - 111 mmol/L Final  . CO2 08/08/2016 31  22 - 32 mmol/L Final  . Glucose, Bld 08/08/2016  113* 65 - 99 mg/dL Final  . BUN 08/08/2016 21* 6 - 20 mg/dL Final  . Creatinine, Ser 08/08/2016 0.65  0.61 - 1.24 mg/dL Final  . Calcium 08/08/2016 8.9  8.9 - 10.3 mg/dL Final  . GFR calc non Af Amer 08/08/2016 >60  >60 mL/min Final  . GFR calc Af Amer 08/08/2016 >60  >60 mL/min Final   Comment: (NOTE) The eGFR has been calculated using the CKD EPI equation. This calculation has not been validated in all clinical situations. eGFR's persistently <60 mL/min signify possible Chronic Kidney Disease.   Georgiann Hahn gap 08/08/2016 5  5 - 15 Final  Hospital Outpatient Visit on 08/01/2016  Component Date Value Ref Range Status  . MRSA, PCR 08/01/2016 NEGATIVE  NEGATIVE Final  . Staphylococcus aureus 08/01/2016 NEGATIVE  NEGATIVE Final   Comment:        The Xpert SA Assay (FDA approved for NASAL specimens in patients over 49 years of age), is one component of a  comprehensive surveillance program.  Test performance has been validated by [facility] for patients greater than or equal to 22 year old. It is not intended to diagnose infection nor to guide or monitor treatment.   Marland Kitchen aPTT 08/01/2016 28  24 - 36 seconds Final  . WBC 08/01/2016 8.4  4.0 - 10.5 K/uL Final  . RBC 08/01/2016 5.66  4.22 - 5.81 MIL/uL Final  . Hemoglobin 08/01/2016 18.5* 13.0 - 17.0 g/dL Final  . HCT 08/01/2016 53.9* 39.0 - 52.0 % Final  . MCV 08/01/2016 95.2  78.0 - 100.0 fL Final  . MCH 08/01/2016 32.7  26.0 - 34.0 pg Final  . MCHC 08/01/2016 34.3  30.0 - 36.0 g/dL Final  . RDW 08/01/2016 13.0  11.5 - 15.5 % Final  . Platelets 08/01/2016 168  150 - 400 K/uL Final  . Sodium 08/01/2016 141  135 - 145 mmol/L Final  . Potassium 08/01/2016 4.3  3.5 - 5.1 mmol/L Final  . Chloride 08/01/2016 108  101 - 111 mmol/L Final  . CO2 08/01/2016 26  22 - 32 mmol/L Final  . Glucose, Bld 08/01/2016 104* 65 - 99 mg/dL Final  . BUN 08/01/2016 18  6 - 20 mg/dL Final  . Creatinine, Ser 08/01/2016 0.77  0.61 - 1.24 mg/dL Final  . Calcium 08/01/2016 9.0  8.9 - 10.3 mg/dL Final  . Total Protein 08/01/2016 6.5  6.5 - 8.1 g/dL Final  . Albumin 08/01/2016 4.0  3.5 - 5.0 g/dL Final  . AST 08/01/2016 16  15 - 41 U/L Final  . ALT 08/01/2016 14* 17 - 63 U/L Final  . Alkaline Phosphatase 08/01/2016 105  38 - 126 U/L Final  . Total Bilirubin 08/01/2016 0.6  0.3 - 1.2 mg/dL Final  . GFR calc non Af Amer 08/01/2016 >60  >60 mL/min Final  . GFR calc Af Amer 08/01/2016 >60  >60 mL/min Final   Comment: (NOTE) The eGFR has been calculated using the CKD EPI equation. This calculation has not been validated in all clinical situations. eGFR's persistently <60 mL/min signify possible Chronic Kidney Disease.   . Anion gap 08/01/2016 7  5 - 15 Final  . Prothrombin Time 08/01/2016 12.7  11.4 - 15.2 seconds Final  . INR 08/01/2016 0.96   Final  . ABO/RH(D) 08/01/2016 O POS   Final  . Antibody Screen  08/01/2016 NEG   Final  . Sample Expiration 08/01/2016 08/09/2016   Final  . Extend sample reason 08/01/2016 NO TRANSFUSIONS OR  PREGNANCY IN THE PAST 3 MONTHS   Final  . Color, Urine 08/01/2016 YELLOW  YELLOW Final  . APPearance 08/01/2016 CLEAR  CLEAR Final  . Specific Gravity, Urine 08/01/2016 1.017  1.005 - 1.030 Final  . pH 08/01/2016 6.0  5.0 - 8.0 Final  . Glucose, UA 08/01/2016 NEGATIVE  NEGATIVE mg/dL Final  . Hgb urine dipstick 08/01/2016 NEGATIVE  NEGATIVE Final  . Bilirubin Urine 08/01/2016 NEGATIVE  NEGATIVE Final  . Ketones, ur 08/01/2016 NEGATIVE  NEGATIVE mg/dL Final  . Protein, ur 08/01/2016 NEGATIVE  NEGATIVE mg/dL Final  . Nitrite 08/01/2016 NEGATIVE  NEGATIVE Final  . Leukocytes, UA 08/01/2016 NEGATIVE  NEGATIVE Final  . ABO/RH(D) 08/01/2016 O POS   Final     X-Rays:Dg Pelvis Portable  Result Date: 08/06/2016 CLINICAL DATA:  Left hip replacement anterior approach EXAM: PORTABLE PELVIS 1-2 VIEWS COMPARISON:  12/03/2012 FINDINGS: Left hip replacement in satisfactory position alignment. No fracture. No knee complication IMPRESSION: Satisfactory left hip replacement. Electronically Signed   By: Franchot Gallo M.D.   On: 08/06/2016 10:22   Dg C-arm 1-60 Min-no Report  Result Date: 08/06/2016 There is no Radiologist interpretation  for this exam.   EKG: Orders placed or performed during the hospital encounter of 12/03/12  . EKG 12-Lead  . EKG 12-Lead  . EKG  . EKG 12-Lead  . EKG 12-Lead     Hospital Course: Patient was admitted to [facility] and taken to the OR and underwent the above state procedure without complications.  Patient tolerated the procedure well and was later transferred to the recovery room and then to the orthopaedic floor for postoperative care.  They were given PO and IV analgesics for pain control following their surgery.  They were given 24 hours of postoperative antibiotics of  Anti-infectives    Start     Dose/Rate Route Frequency  Ordered Stop   08/06/16 1300  ceFAZolin (ANCEF) IVPB 2g/100 mL premix     2 g 200 mL/hr over 30 Minutes Intravenous Every 6 hours 08/06/16 1146 08/06/16 2041   08/06/16 0600  ceFAZolin (ANCEF) 3 g in dextrose 5 % 50 mL IVPB    Comments:  Dose increased to 3g per P&T policy for weight > 300TM.   3 g 130 mL/hr over 30 Minutes Intravenous On call to O.R. 08/05/16 1335 08/06/16 0737     and started on DVT prophylaxis in the form of Xarelto.   PT and OT were ordered for total hip protocol.  The patient was allowed to be WBAT with therapy. Discharge planning was consulted to help with postop disposition and equipment needs.  Patient had a decent night on the evening of surgery.  They started to get up OOB with therapy on day one.  Hemovac drain was pulled without difficulty.  Continued to work with therapy into day two.  Dressing was changed on day two and the incision was healing well. Patient was seen in rounds and was ready to go home.  Discharge home with home health Diet - Cardiac diet Follow up - in 2 weeks Activity - WBAT Disposition - Home Condition Upon Discharge - Good D/C Meds - See DC Summary DVT Prophylaxis - Xarelto  Discharge Instructions    Call MD / Call 911    Complete by:  As directed    If you experience chest pain or shortness of breath, CALL 911 and be transported to the hospital emergency room.  If you develope a fever above 101 F,  pus (white drainage) or increased drainage or redness at the wound, or calf pain, call your surgeon's office.   Change dressing    Complete by:  As directed    You may change your dressing dressing daily with sterile 4 x 4 inch gauze dressing and paper tape.  Do not submerge the incision under water.   Constipation Prevention    Complete by:  As directed    Drink plenty of fluids.  Prune juice may be helpful.  You may use a stool softener, such as Colace (over the counter) 100 mg twice a day.  Use MiraLax (over the counter) for constipation as  needed.   Diet - low sodium heart healthy    Complete by:  As directed    Discharge instructions    Complete by:  As directed    Pick up stool softner and laxative for home use following surgery while on pain medications. Do not submerge incision under water. Please use good hand washing techniques while changing dressing each day. May shower starting three days after surgery. Please use a clean towel to pat the incision dry following showers. Continue to use ice for pain and swelling after surgery. Do not use any lotions or creams on the incision until instructed by your surgeon.  Wear both TED hose on both legs during the day every day for three weeks, but may have off at night at home.  Postoperative Constipation Protocol  Constipation - defined medically as fewer than three stools per week and severe constipation as less than one stool per week.  One of the most common issues patients have following surgery is constipation.  Even if you have a regular bowel pattern at home, your normal regimen is likely to be disrupted due to multiple reasons following surgery.  Combination of anesthesia, postoperative narcotics, change in appetite and fluid intake all can affect your bowels.  In order to avoid complications following surgery, here are some recommendations in order to help you during your recovery period.  Colace (docusate) - Pick up an over-the-counter form of Colace or another stool softener and take twice a day as long as you are requiring postoperative pain medications.  Take with a full glass of water daily.  If you experience loose stools or diarrhea, hold the colace until you stool forms back up.  If your symptoms do not get better within 1 week or if they get worse, check with your doctor.  Dulcolax (bisacodyl) - Pick up over-the-counter and take as directed by the product packaging as needed to assist with the movement of your bowels.  Take with a full glass of water.  Use this  product as needed if not relieved by Colace only.   MiraLax (polyethylene glycol) - Pick up over-the-counter to have on hand.  MiraLax is a solution that will increase the amount of water in your bowels to assist with bowel movements.  Take as directed and can mix with a glass of water, juice, soda, coffee, or tea.  Take if you go more than two days without a movement. Do not use MiraLax more than once per day. Call your doctor if you are still constipated or irregular after using this medication for 7 days in a row.  If you continue to have problems with postoperative constipation, please contact the office for further assistance and recommendations.  If you experience "the worst abdominal pain ever" or develop nausea or vomiting, please contact the office immediatly for further recommendations  for treatment.   Take Xarelto for two and a half more weeks, then discontinue Xarelto. Once the patient has completed the Xarelto, they may resume the 81 mg Aspirin.   Do not sit on low chairs, stoools or toilet seats, as it may be difficult to get up from low surfaces    Complete by:  As directed    Driving restrictions    Complete by:  As directed    No driving until released by the physician.   Increase activity slowly as tolerated    Complete by:  As directed    Lifting restrictions    Complete by:  As directed    No lifting until released by the physician.   Patient may shower    Complete by:  As directed    You may shower without a dressing once there is no drainage.  Do not wash over the wound.  If drainage remains, do not shower until drainage stops.   TED hose    Complete by:  As directed    Use stockings (TED hose) for 3 weeks on both leg(s).  You may remove them at night for sleeping.   Weight bearing as tolerated    Complete by:  As directed    Laterality:  left   Extremity:  Lower     Allergies as of 08/08/2016      Reactions   Oxycodone    itch      Medication List    STOP  taking these medications   ADVIL PM PO   ALEVE PM PO   ALEVE PO   ASPIRIN EC EXTRA STRENGTH PO   B-12 PO   BC HEADACHE POWDER PO   diclofenac sodium 1 % Gel Commonly known as:  VOLTAREN   ICY HOT EX   LEG CRAMP RELIEF PO   Menthol (Topical Analgesic) 10 % Liqd   OVER THE COUNTER MEDICATION     TAKE these medications   HYDROmorphone 2 MG tablet Commonly known as:  DILAUDID Take 1-2 tablets (2-4 mg total) by mouth every 4 (four) hours as needed for moderate pain or severe pain.   methocarbamol 500 MG tablet Commonly known as:  ROBAXIN Take 1 tablet (500 mg total) by mouth every 6 (six) hours as needed for muscle spasms.   rivaroxaban 10 MG Tabs tablet Commonly known as:  XARELTO Take 1 tablet (10 mg total) by mouth daily with breakfast. Take Xarelto for two and a half more weeks following discharge from the hospital, then discontinue Xarelto. Once the patient has completed the Xarelto, they may resume the 81 mg Aspirin. Start taking on:  08/09/2016   traMADol 50 MG tablet Commonly known as:  ULTRAM Take 1-2 tablets (50-100 mg total) by mouth every 6 (six) hours as needed (MILD PAIN). What changed:  how much to take  when to take this  reasons to take this      Follow-up [facility].   Specialty:  Home Health Services Why:  home health physical therapy Contact information: [address] [place] [address] [phone]        Gearlean Alf, MD. Schedule an appointment as soon as possible for a visit on 08/19/2016.   Specialty:  Orthopedic Surgery Contact information: [address] [place] [address] [phone]           Signed: Arlee Muslim, PA-C Orthopaedic Surgery 08/08/2016, 9:15 AM

## 2016-08-08 NOTE — Progress Notes (Signed)
Occupational Therapy Treatment Patient Details Name: Dennis Wyatt MRN: [id number] DOB: May 20, 1961 Today's Date: 08/08/2016    History of present illness Pt s/p L THR and with hx of CVA (14)   OT comments  Pt making good progress. Family will A as needed  Follow Up Recommendations  Home health OT;Supervision - Intermittent    Equipment Recommendations  3 in 1 bedside commode    Recommendations for Other Services      Precautions / Restrictions Precautions Precautions: Fall Restrictions Weight Bearing Restrictions: No Other Position/Activity Restrictions: WBAT       Mobility Bed Mobility               General bed mobility comments: pt in chair  Transfers Overall transfer level: Needs assistance Equipment used: Rolling walker (2 wheeled) Transfers: Sit to/from BJ'sStand;Stand Pivot Transfers Sit to Stand: Supervision Stand pivot transfers: Supervision                ADL Overall ADL's : Needs assistance/impaired     Grooming: Standing;Supervision/safety       Lower Body Bathing: Supervison/ safety;Sit to/from stand;Cueing for safety;Cueing for sequencing       Lower Body Dressing: Minimal assistance;Sit to/from stand;Cueing for safety;Cueing for sequencing   Toilet Transfer: Supervision/safety;RW;Ambulation;Comfort height toilet   Toileting- Clothing Manipulation and Hygiene: Supervision/safety;Sit to/from stand;Cueing for safety;Cueing for sequencing   Tub/ Shower Transfer: Walk-in shower;Supervision/safety;Cueing for sequencing;Cueing for safety;Ambulation;Rolling walker   Functional mobility during ADLs: Supervision/safety                  Cognition   Behavior During Therapy: WFL for tasks assessed/performed Overall Cognitive Status: Within Functional Limits for tasks assessed                               General Comments      Pertinent Vitals/ Pain       Pain Assessment: 0-10 Pain Score: 3  Pain Location: L hip Pain  Descriptors / Indicators: Aching;Sore Pain Intervention(s): Monitored during session         Frequency  Min 2X/week        Progress Toward Goals  OT Goals(current goals can now be found in the care plan section)  Progress towards OT goals: Progressing toward goals     Plan Discharge plan remains appropriate       End of Session Equipment Utilized During Treatment: Rolling walker;Gait belt   Activity Tolerance Patient tolerated treatment well   Patient Left in chair;with call bell/phone within reach;with chair alarm set   Nurse Communication Mobility status        Time: [id number] OT Time Calculation (min): 9 min  Charges: OT General Charges $OT Visit: 1 Procedure OT Treatments $Self Care/Home Management : 8-22 mins  Velvie Thomaston, Karin GoldenLorraine D 08/08/2016, 10:39 AM

## 2016-08-08 NOTE — Progress Notes (Signed)
Physical Therapy Treatment Patient Details Name: Dennis Wyatt Deasis MRN: [id number] DOB: [birth year] Today's Date: 08/08/2016    History of Present Illness Pt s/p L THR and with hx of CVA (14)    PT Comments    POD # 2  Pt sitting Indep EOB on arrival.  Assisted with amb in hallway, practiced stairs then returned to room to perform THR TE's following HEP handout.  Instructed on proper tech and freq as well as use of ICE.   Follow Up Recommendations  Home health PT     Equipment Recommendations  None recommended by PT    Recommendations for Other Services       Precautions / Restrictions Precautions Precautions: Fall Restrictions Weight Bearing Restrictions: No Other Position/Activity Restrictions: WBAT    Mobility  Bed Mobility               General bed mobility comments: pt sitting EOB on arrival  Transfers Overall transfer level: Needs assistance Equipment used: Rolling walker (2 wheeled) Transfers: Sit to/from Stand Sit to Stand: Supervision Stand pivot transfers: Supervision       General transfer comment: <25% cues for LE management and use of UEs to self assist  Ambulation/Gait Ambulation/Gait assistance: Supervision Ambulation Distance (Feet): 25 Feet Assistive device: Rolling walker (2 wheeled) Gait Pattern/deviations: Step-to pattern;Decreased step length - right;Decreased step length - left;Shuffle;Trunk flexed Gait velocity: decr   General Gait Details: decreased amb distance to perform stairs and TE's this session   Stairs Stairs: Yes   Stair Management: Two rails;Forwards;Step to pattern Number of Stairs: 2 General stair comments: 25% VC's on proper tech and sequencing  Wheelchair Mobility    Modified Rankin (Stroke Patients Only)       Balance                                    Cognition Arousal/Alertness: Awake/alert Behavior During Therapy: WFL for tasks assessed/performed Overall Cognitive Status: Within  Functional Limits for tasks assessed                      Exercises   Total Hip Replacement TE's 10 reps ankle pumps 10 reps knee presses 10 reps heel slides 10 reps SAQ's 10 reps ABD Followed by ICE     General Comments        Pertinent Vitals/Pain Pain Assessment: 0-10 Pain Score: 5  Pain Location: L hip Pain Descriptors / Indicators: Aching;Sore Pain Intervention(s): Monitored during session;Repositioned;Ice applied    Home Living                      Prior Function            PT Goals (current goals can now be found in the care plan section)      Frequency    7X/week      PT Plan Current plan remains appropriate    Co-evaluation             End of Session Equipment Utilized During Treatment: Gait belt Activity Tolerance: Patient tolerated treatment well Patient left: in chair;with call bell/phone within reach     Time: 0923-0950 PT Time Calculation (min) (ACUTE ONLY): 27 min  Charges:  $Gait Training: 8-22 mins $Therapeutic Exercise: 8-22 mins                    G Codes:  Rica Koyanagi  PTA [facility]  Acute  Rehab Pager      [phone]

## 2016-08-08 NOTE — Progress Notes (Signed)
   Subjective: 2 Days Post-Op Procedure(s) (LRB): LEFT TOTAL HIP ARTHROPLASTY ANTERIOR APPROACH (Left) Patient reports pain as mild.   Patient seen in rounds by Dr. Lequita HaltAluisio.  [facility] Patient is well, and has had no acute complaints or problems Patient is ready to go home  Objective: Vital signs in last 24 hours: Temp:  [97.6 F (36.4 C)-98.7 F (37.1 C)] 97.6 F (36.4 C) (01/26 0551) Pulse Rate:  [60-73] 62 (01/26 0551) Resp:  [18] 18 (01/26 0551) BP: (113-150)/(52-76) 150/73 (01/26 0551) SpO2:  [95 %-98 %] 95 % (01/26 0551)  Intake/Output from previous day:  Intake/Output Summary (Last 24 hours) at 08/08/16 0906 Last data filed at 08/08/16 0430  Gross per 24 hour  Intake              960 ml  Output              500 ml  Net              460 ml    Intake/Output this shift: No intake/output data recorded.  Labs:  Recent Labs  08/07/16 0504 08/08/16 0435  HGB 15.3 15.6    Recent Labs  08/07/16 0504 08/08/16 0435  WBC 15.9* 17.8*  RBC 4.75 4.81  HCT 45.6 46.1  PLT 142* 173    Recent Labs  08/07/16 0504 08/08/16 0435  NA 140 140  K 4.2 4.1  CL 106 104  CO2 28 31  BUN 23* 21*  CREATININE 0.62 0.65  GLUCOSE 142* 113*  CALCIUM 8.3* 8.9   No results for input(s): LABPT, INR in the last 72 hours.  EXAM: General - Patient is Alert and Appropriate Extremity - Neurovascular intact Sensation intact distally Dorsiflexion/Plantar flexion intact Incision - clean, dry, no drainage Motor Function - intact, moving foot and toes well on exam.   Assessment/Plan: 2 Days Post-Op Procedure(s) (LRB): LEFT TOTAL HIP ARTHROPLASTY ANTERIOR APPROACH (Left) Procedure(s) (LRB): LEFT TOTAL HIP ARTHROPLASTY ANTERIOR APPROACH (Left) Past Medical History:  Diagnosis Date  . Arthritis   . GERD (gastroesophageal reflux disease)   . Headache   . Hypertension   . Stroke (HCC) 11/2012  . Tooth pain    uses BC powders for teeth pain   Principal Problem:  OA (osteoarthritis) of hip  Estimated body mass index is 39.86 kg/m as calculated from the following:   Height as of this encounter: 5\' 11"  (1.803 m).   Weight as of this encounter: 129.6 kg (285 lb 12.8 oz). Up with therapy Discharge home with home health Diet - Cardiac diet Follow up - in 2 weeks Activity - WBAT Disposition - Home Condition Upon Discharge - Good D/C Meds - See DC Summary DVT Prophylaxis - Xarelto  Avel Peacerew Misheel Gowans, PA-C Orthopaedic Surgery 08/08/2016, 9:06 AM

## 2016-08-08 NOTE — Progress Notes (Signed)
Patient being d/ced home with family

## 2016-08-08 NOTE — Discharge Instructions (Signed)
° °Dr. Frank Aluisio °Total Joint Specialist °[facility] °[address] °[place], [place] [address] °[phone] ° °ANTERIOR APPROACH TOTAL HIP REPLACEMENT POSTOPERATIVE DIRECTIONS ° ° °Hip Rehabilitation, Guidelines Following Surgery  °The results of a hip operation are greatly improved after range of motion and muscle strengthening exercises. Follow all safety measures which are given to protect your hip. If any of these exercises cause increased pain or swelling in your joint, decrease the amount until you are comfortable again. Then slowly increase the exercises. Call your caregiver if you have problems or questions.  ° °HOME CARE INSTRUCTIONS  °Remove items at home which could result in a fall. This includes throw rugs or furniture in walking pathways.  °· ICE to the affected hip every three hours for 30 minutes at a time and then as needed for pain and swelling.  Continue to use ice on the hip for pain and swelling from surgery. You may notice swelling that will progress down to the foot and ankle.  This is normal after surgery.  Elevate the leg when you are not up walking on it.   °· Continue to use the breathing machine which will help keep your temperature down.  It is common for your temperature to cycle up and down following surgery, especially at night when you are not up moving around and exerting yourself.  The breathing machine keeps your lungs expanded and your temperature down. ° ° °DIET °You may resume your previous home diet once your are discharged from the hospital. ° °DRESSING / WOUND CARE / SHOWERING °You may shower 3 days after surgery, but keep the wounds dry during showering.  You may use an occlusive plastic wrap (Press'n Seal for example), NO SOAKING/SUBMERGING IN THE BATHTUB.  If the bandage gets wet, change with a clean dry gauze.  If the incision gets wet, pat the wound dry with a clean towel. °You may start showering once you are discharged home but do not  submerge the incision under water. Just pat the incision dry and apply a dry gauze dressing on daily. °Change the surgical dressing daily and reapply a dry dressing each time. ° °ACTIVITY °Walk with your walker as instructed. °Use walker as long as suggested by your caregivers. °Avoid periods of inactivity such as sitting longer than an hour when not asleep. This helps prevent blood clots.  °You may resume a sexual relationship in one month or when given the OK by your doctor.  °You may return to work once you are cleared by your doctor.  °Do not drive a car for 6 weeks or until released by you surgeon.  °Do not drive while taking narcotics. ° °WEIGHT BEARING °Weight bearing as tolerated with assist device (walker, cane, etc) as directed, use it as long as suggested by your surgeon or therapist, typically at least 4-6 weeks. ° °POSTOPERATIVE CONSTIPATION PROTOCOL °Constipation - defined medically as fewer than three stools per week and severe constipation as less than one stool per week. ° °One of the most common issues patients have following surgery is constipation.  Even if you have a regular bowel pattern at home, your normal regimen is likely to be disrupted due to multiple reasons following surgery.  Combination of anesthesia, postoperative narcotics, change in appetite and fluid intake all can affect your bowels.  In order to avoid complications following surgery, here are some recommendations in order to help you during your recovery period. ° °Colace (docusate) - Pick up an over-the-counter   form of Colace or another stool softener and take twice a day as long as you are requiring postoperative pain medications.  Take with a full glass of water daily.  If you experience loose stools or diarrhea, hold the colace until you stool forms back up.  If your symptoms do not get better within 1 week or if they get worse, check with your doctor. ° °Dulcolax (bisacodyl) - Pick up over-the-counter and take as directed  by the product packaging as needed to assist with the movement of your bowels.  Take with a full glass of water.  Use this product as needed if not relieved by Colace only.  ° °MiraLax (polyethylene glycol) - Pick up over-the-counter to have on hand.  MiraLax is a solution that will increase the amount of water in your bowels to assist with bowel movements.  Take as directed and can mix with a glass of water, juice, soda, coffee, or tea.  Take if you go more than two days without a movement. °Do not use MiraLax more than once per day. Call your doctor if you are still constipated or irregular after using this medication for 7 days in a row. ° °If you continue to have problems with postoperative constipation, please contact the office for further assistance and recommendations.  If you experience "the worst abdominal pain ever" or develop nausea or vomiting, please contact the office immediatly for further recommendations for treatment. ° °ITCHING ° If you experience itching with your medications, try taking only a single pain pill, or even half a pain pill at a time.  You can also use Benadryl over the counter for itching or also to help with sleep.  ° °TED HOSE STOCKINGS °Wear the elastic stockings on both legs for three weeks following surgery during the day but you may remove then at night for sleeping. ° °MEDICATIONS °See your medication summary on the “After Visit Summary” that the nursing staff will review with you prior to discharge.  You may have some home medications which will be placed on hold until you complete the course of blood thinner medication.  It is important for you to complete the blood thinner medication as prescribed by your surgeon.  Continue your approved medications as instructed at time of discharge. ° °PRECAUTIONS °If you experience chest pain or shortness of breath - call 911 immediately for transfer to the hospital emergency department.  °If you develop a fever greater that 101 F,  purulent drainage from wound, increased redness or drainage from wound, foul odor from the wound/dressing, or calf pain - CONTACT YOUR SURGEON.   °                                                °FOLLOW-UP APPOINTMENTS °Make sure you keep all of your appointments after your operation with your surgeon and caregivers. You should call the office at the above phone number and make an appointment for approximately two weeks after the date of your surgery or on the date instructed by your surgeon outlined in the "After Visit Summary". ° °RANGE OF MOTION AND STRENGTHENING EXERCISES  °These exercises are designed to help you keep full movement of your hip joint. Follow your caregiver's or physical therapist's instructions. Perform all exercises about fifteen times, three times per day or as directed. Exercise both hips, even if you   have had only one joint replacement. These exercises can be done on a training (exercise) mat, on the floor, on a table or on a bed. Use whatever works the best and is most comfortable for you. Use music or television while you are exercising so that the exercises are a pleasant break in your day. This will make your life better with the exercises acting as a break in routine you can look forward to.  °Lying on your back, slowly slide your foot toward your buttocks, raising your knee up off the floor. Then slowly slide your foot back down until your leg is straight again.  °Lying on your back spread your legs as far apart as you can without causing discomfort.  °Lying on your side, raise your upper leg and foot straight up from the floor as far as is comfortable. Slowly lower the leg and repeat.  °Lying on your back, tighten up the muscle in the front of your thigh (quadriceps muscles). You can do this by keeping your leg straight and trying to raise your heel off the floor. This helps strengthen the largest muscle supporting your knee.  °Lying on your back, tighten up the muscles of your  buttocks both with the legs straight and with the knee bent at a comfortable angle while keeping your heel on the floor.  ° °IF YOU ARE TRANSFERRED TO A SKILLED REHAB FACILITY °If the patient is transferred to a skilled rehab facility following release from the hospital, a list of the current medications will be sent to the facility for the patient to continue.  When discharged from the skilled rehab facility, please have the facility set up the patient's Home Health Physical Therapy prior to being released. Also, the skilled facility will be responsible for providing the patient with their medications at time of release from the facility to include their pain medication, the muscle relaxants, and their blood thinner medication. If the patient is still at the rehab facility at time of the two week follow up appointment, the skilled rehab facility will also need to assist the patient in arranging follow up appointment in our office and any transportation needs. ° °MAKE SURE YOU:  °Understand these instructions.  °Get help right away if you are not doing well or get worse.  ° ° °Pick up stool softner and laxative for home use following surgery while on pain medications. °Do not submerge incision under water. °Please use good hand washing techniques while changing dressing each day. °May shower starting three days after surgery. °Please use a clean towel to pat the incision dry following showers. °Continue to use ice for pain and swelling after surgery. °Do not use any lotions or creams on the incision until instructed by your surgeon. ° °Take Xarelto for two and a half more weeks following discharge from the hospital, then discontinue Xarelto. °Once the patient has completed the Xarelto, they may resume the 81 mg Aspirin. ° ° °Information on my medicine - XARELTO® (Rivaroxaban) ° °This medication education was reviewed with me or my healthcare representative as part of my discharge preparation.  The pharmacist that  spoke with me during my hospital stay was:  Hussein Askar, Student-PharmD ° °Why was Xarelto® prescribed for you? °Xarelto® was prescribed for you to reduce the risk of blood clots forming after orthopedic surgery. The medical term for these abnormal blood clots is venous thromboembolism (VTE). ° °What do you need to know about xarelto® ? °Take your Xarelto® ONCE DAILY at   the same time every day. °You may take it either with or without food. ° °If you have difficulty swallowing the tablet whole, you may crush it and mix in applesauce just prior to taking your dose. ° °Take Xarelto® exactly as prescribed by your doctor and DO NOT stop taking Xarelto® without talking to the doctor who prescribed the medication.  Stopping without other VTE prevention medication to take the place of Xarelto® may increase your risk of developing a clot. ° °After discharge, you should have regular check-up appointments with your healthcare provider that is prescribing your Xarelto®.   ° °What do you do if you miss a dose? °If you miss a dose, take it as soon as you remember on the same day then continue your regularly scheduled once daily regimen the next day. Do not take two doses of Xarelto® on the same day.  ° °Important Safety Information °A possible side effect of Xarelto® is bleeding. You should call your healthcare provider right away if you experience any of the following: °? Bleeding from an injury or your nose that does not stop. °? Unusual colored urine (red or dark brown) or unusual colored stools (red or black). °? Unusual bruising for unknown reasons. °? A serious fall or if you hit your head (even if there is no bleeding). ° °Some medicines may interact with Xarelto® and might increase your risk of bleeding while on Xarelto®. To help avoid this, consult your healthcare provider or pharmacist prior to using any new prescription or non-prescription medications, including herbals, vitamins, non-steroidal anti-inflammatory  drugs (NSAIDs) and supplements. ° °This website has more information on Xarelto®: [email or web address].com. ° ° ° °

## 2016-08-11 DIAGNOSIS — Z471 Aftercare following joint replacement surgery: Secondary | ICD-10-CM | POA: Diagnosis not present

## 2016-08-11 DIAGNOSIS — K219 Gastro-esophageal reflux disease without esophagitis: Secondary | ICD-10-CM | POA: Diagnosis not present

## 2016-08-11 DIAGNOSIS — Z79891 Long term (current) use of opiate analgesic: Secondary | ICD-10-CM | POA: Diagnosis not present

## 2016-08-11 DIAGNOSIS — Z96642 Presence of left artificial hip joint: Secondary | ICD-10-CM | POA: Diagnosis not present

## 2016-08-11 DIAGNOSIS — I10 Essential (primary) hypertension: Secondary | ICD-10-CM | POA: Diagnosis not present

## 2016-08-11 DIAGNOSIS — F17210 Nicotine dependence, cigarettes, uncomplicated: Secondary | ICD-10-CM | POA: Diagnosis not present

## 2016-08-11 DIAGNOSIS — Z7901 Long term (current) use of anticoagulants: Secondary | ICD-10-CM | POA: Diagnosis not present

## 2016-08-18 DIAGNOSIS — Z471 Aftercare following joint replacement surgery: Secondary | ICD-10-CM | POA: Diagnosis not present

## 2016-08-18 DIAGNOSIS — K219 Gastro-esophageal reflux disease without esophagitis: Secondary | ICD-10-CM | POA: Diagnosis not present

## 2016-08-18 DIAGNOSIS — Z96642 Presence of left artificial hip joint: Secondary | ICD-10-CM | POA: Diagnosis not present

## 2016-08-18 DIAGNOSIS — F17210 Nicotine dependence, cigarettes, uncomplicated: Secondary | ICD-10-CM | POA: Diagnosis not present

## 2016-08-18 DIAGNOSIS — Z7901 Long term (current) use of anticoagulants: Secondary | ICD-10-CM | POA: Diagnosis not present

## 2016-08-18 DIAGNOSIS — I10 Essential (primary) hypertension: Secondary | ICD-10-CM | POA: Diagnosis not present

## 2016-08-18 DIAGNOSIS — Z79891 Long term (current) use of opiate analgesic: Secondary | ICD-10-CM | POA: Diagnosis not present

## 2016-08-25 DIAGNOSIS — I10 Essential (primary) hypertension: Secondary | ICD-10-CM | POA: Diagnosis not present

## 2016-08-25 DIAGNOSIS — Z471 Aftercare following joint replacement surgery: Secondary | ICD-10-CM | POA: Diagnosis not present

## 2016-08-25 DIAGNOSIS — K219 Gastro-esophageal reflux disease without esophagitis: Secondary | ICD-10-CM | POA: Diagnosis not present

## 2016-08-25 DIAGNOSIS — Z7901 Long term (current) use of anticoagulants: Secondary | ICD-10-CM | POA: Diagnosis not present

## 2016-08-25 DIAGNOSIS — Z96642 Presence of left artificial hip joint: Secondary | ICD-10-CM | POA: Diagnosis not present

## 2016-08-25 DIAGNOSIS — F17210 Nicotine dependence, cigarettes, uncomplicated: Secondary | ICD-10-CM | POA: Diagnosis not present

## 2016-08-25 DIAGNOSIS — Z79891 Long term (current) use of opiate analgesic: Secondary | ICD-10-CM | POA: Diagnosis not present

## 2016-12-12 DIAGNOSIS — M1612 Unilateral primary osteoarthritis, left hip: Secondary | ICD-10-CM | POA: Diagnosis not present

## 2018-10-11 DIAGNOSIS — M9903 Segmental and somatic dysfunction of lumbar region: Secondary | ICD-10-CM | POA: Diagnosis not present

## 2018-10-11 DIAGNOSIS — M9905 Segmental and somatic dysfunction of pelvic region: Secondary | ICD-10-CM | POA: Diagnosis not present

## 2018-10-11 DIAGNOSIS — M9904 Segmental and somatic dysfunction of sacral region: Secondary | ICD-10-CM | POA: Diagnosis not present

## 2018-10-11 DIAGNOSIS — M5136 Other intervertebral disc degeneration, lumbar region: Secondary | ICD-10-CM | POA: Diagnosis not present

## 2018-10-12 DIAGNOSIS — M9904 Segmental and somatic dysfunction of sacral region: Secondary | ICD-10-CM | POA: Diagnosis not present

## 2018-10-12 DIAGNOSIS — M5136 Other intervertebral disc degeneration, lumbar region: Secondary | ICD-10-CM | POA: Diagnosis not present

## 2018-10-12 DIAGNOSIS — M9905 Segmental and somatic dysfunction of pelvic region: Secondary | ICD-10-CM | POA: Diagnosis not present

## 2018-10-12 DIAGNOSIS — M9903 Segmental and somatic dysfunction of lumbar region: Secondary | ICD-10-CM | POA: Diagnosis not present

## 2018-10-14 DIAGNOSIS — M9903 Segmental and somatic dysfunction of lumbar region: Secondary | ICD-10-CM | POA: Diagnosis not present

## 2018-10-14 DIAGNOSIS — M9905 Segmental and somatic dysfunction of pelvic region: Secondary | ICD-10-CM | POA: Diagnosis not present

## 2018-10-14 DIAGNOSIS — M9904 Segmental and somatic dysfunction of sacral region: Secondary | ICD-10-CM | POA: Diagnosis not present

## 2018-10-14 DIAGNOSIS — N1832 Chronic kidney disease, stage 3b: Secondary | ICD-10-CM | POA: Diagnosis not present

## 2018-10-14 DIAGNOSIS — M5136 Other intervertebral disc degeneration, lumbar region: Secondary | ICD-10-CM | POA: Diagnosis not present

## 2018-11-08 DIAGNOSIS — M9904 Segmental and somatic dysfunction of sacral region: Secondary | ICD-10-CM | POA: Diagnosis not present

## 2018-11-08 DIAGNOSIS — M9905 Segmental and somatic dysfunction of pelvic region: Secondary | ICD-10-CM | POA: Diagnosis not present

## 2018-11-08 DIAGNOSIS — M9903 Segmental and somatic dysfunction of lumbar region: Secondary | ICD-10-CM | POA: Diagnosis not present

## 2018-11-08 DIAGNOSIS — M5136 Other intervertebral disc degeneration, lumbar region: Secondary | ICD-10-CM | POA: Diagnosis not present

## 2018-11-22 DIAGNOSIS — M5136 Other intervertebral disc degeneration, lumbar region: Secondary | ICD-10-CM | POA: Diagnosis not present

## 2018-11-22 DIAGNOSIS — M9903 Segmental and somatic dysfunction of lumbar region: Secondary | ICD-10-CM | POA: Diagnosis not present

## 2018-11-22 DIAGNOSIS — M9904 Segmental and somatic dysfunction of sacral region: Secondary | ICD-10-CM | POA: Diagnosis not present

## 2018-11-22 DIAGNOSIS — M9905 Segmental and somatic dysfunction of pelvic region: Secondary | ICD-10-CM | POA: Diagnosis not present

## 2019-01-06 DIAGNOSIS — M5136 Other intervertebral disc degeneration, lumbar region: Secondary | ICD-10-CM | POA: Diagnosis not present

## 2019-01-06 DIAGNOSIS — M9905 Segmental and somatic dysfunction of pelvic region: Secondary | ICD-10-CM | POA: Diagnosis not present

## 2019-01-06 DIAGNOSIS — M9904 Segmental and somatic dysfunction of sacral region: Secondary | ICD-10-CM | POA: Diagnosis not present

## 2019-01-06 DIAGNOSIS — M9903 Segmental and somatic dysfunction of lumbar region: Secondary | ICD-10-CM | POA: Diagnosis not present

## 2019-01-11 DIAGNOSIS — M9903 Segmental and somatic dysfunction of lumbar region: Secondary | ICD-10-CM | POA: Diagnosis not present

## 2019-01-11 DIAGNOSIS — M5136 Other intervertebral disc degeneration, lumbar region: Secondary | ICD-10-CM | POA: Diagnosis not present

## 2019-01-11 DIAGNOSIS — M9905 Segmental and somatic dysfunction of pelvic region: Secondary | ICD-10-CM | POA: Diagnosis not present

## 2019-01-11 DIAGNOSIS — M9904 Segmental and somatic dysfunction of sacral region: Secondary | ICD-10-CM | POA: Diagnosis not present

## 2019-02-01 DIAGNOSIS — M9905 Segmental and somatic dysfunction of pelvic region: Secondary | ICD-10-CM | POA: Diagnosis not present

## 2019-02-01 DIAGNOSIS — M9903 Segmental and somatic dysfunction of lumbar region: Secondary | ICD-10-CM | POA: Diagnosis not present

## 2019-02-01 DIAGNOSIS — M5136 Other intervertebral disc degeneration, lumbar region: Secondary | ICD-10-CM | POA: Diagnosis not present

## 2019-02-01 DIAGNOSIS — M9904 Segmental and somatic dysfunction of sacral region: Secondary | ICD-10-CM | POA: Diagnosis not present

## 2019-02-23 DIAGNOSIS — M5136 Other intervertebral disc degeneration, lumbar region: Secondary | ICD-10-CM | POA: Diagnosis not present

## 2019-02-23 DIAGNOSIS — M9905 Segmental and somatic dysfunction of pelvic region: Secondary | ICD-10-CM | POA: Diagnosis not present

## 2019-02-23 DIAGNOSIS — M9903 Segmental and somatic dysfunction of lumbar region: Secondary | ICD-10-CM | POA: Diagnosis not present

## 2019-02-23 DIAGNOSIS — M9904 Segmental and somatic dysfunction of sacral region: Secondary | ICD-10-CM | POA: Diagnosis not present

## 2019-02-24 DIAGNOSIS — M9904 Segmental and somatic dysfunction of sacral region: Secondary | ICD-10-CM | POA: Diagnosis not present

## 2019-02-24 DIAGNOSIS — M9905 Segmental and somatic dysfunction of pelvic region: Secondary | ICD-10-CM | POA: Diagnosis not present

## 2019-02-24 DIAGNOSIS — M5136 Other intervertebral disc degeneration, lumbar region: Secondary | ICD-10-CM | POA: Diagnosis not present

## 2019-02-24 DIAGNOSIS — M9903 Segmental and somatic dysfunction of lumbar region: Secondary | ICD-10-CM | POA: Diagnosis not present

## 2019-04-29 DIAGNOSIS — E559 Vitamin D deficiency, unspecified: Secondary | ICD-10-CM | POA: Diagnosis not present

## 2019-04-29 DIAGNOSIS — L0390 Cellulitis, unspecified: Secondary | ICD-10-CM | POA: Diagnosis not present

## 2019-04-29 DIAGNOSIS — Z23 Encounter for immunization: Secondary | ICD-10-CM | POA: Diagnosis not present

## 2019-04-29 DIAGNOSIS — M545 Low back pain: Secondary | ICD-10-CM | POA: Diagnosis not present

## 2019-04-29 DIAGNOSIS — I10 Essential (primary) hypertension: Secondary | ICD-10-CM | POA: Diagnosis not present

## 2019-04-29 DIAGNOSIS — M129 Arthropathy, unspecified: Secondary | ICD-10-CM | POA: Diagnosis not present

## 2019-04-29 DIAGNOSIS — G629 Polyneuropathy, unspecified: Secondary | ICD-10-CM | POA: Diagnosis not present

## 2019-04-29 DIAGNOSIS — M25561 Pain in right knee: Secondary | ICD-10-CM | POA: Diagnosis not present

## 2019-04-29 DIAGNOSIS — R200 Anesthesia of skin: Secondary | ICD-10-CM | POA: Diagnosis not present

## 2019-04-29 DIAGNOSIS — Z79899 Other long term (current) drug therapy: Secondary | ICD-10-CM | POA: Diagnosis not present

## 2019-04-29 DIAGNOSIS — F17210 Nicotine dependence, cigarettes, uncomplicated: Secondary | ICD-10-CM | POA: Diagnosis not present

## 2019-04-29 DIAGNOSIS — Z1159 Encounter for screening for other viral diseases: Secondary | ICD-10-CM | POA: Diagnosis not present

## 2019-05-13 DIAGNOSIS — M545 Low back pain: Secondary | ICD-10-CM | POA: Diagnosis not present

## 2019-05-13 DIAGNOSIS — M25552 Pain in left hip: Secondary | ICD-10-CM | POA: Diagnosis not present

## 2019-05-13 DIAGNOSIS — I10 Essential (primary) hypertension: Secondary | ICD-10-CM | POA: Diagnosis not present

## 2019-05-13 DIAGNOSIS — M25561 Pain in right knee: Secondary | ICD-10-CM | POA: Diagnosis not present

## 2019-05-13 DIAGNOSIS — Z79899 Other long term (current) drug therapy: Secondary | ICD-10-CM | POA: Diagnosis not present

## 2019-05-13 DIAGNOSIS — F17210 Nicotine dependence, cigarettes, uncomplicated: Secondary | ICD-10-CM | POA: Diagnosis not present

## 2019-05-20 DIAGNOSIS — Z125 Encounter for screening for malignant neoplasm of prostate: Secondary | ICD-10-CM | POA: Diagnosis not present

## 2019-05-20 DIAGNOSIS — Z131 Encounter for screening for diabetes mellitus: Secondary | ICD-10-CM | POA: Diagnosis not present

## 2019-05-20 DIAGNOSIS — Z0000 Encounter for general adult medical examination without abnormal findings: Secondary | ICD-10-CM | POA: Diagnosis not present

## 2019-05-20 DIAGNOSIS — R5383 Other fatigue: Secondary | ICD-10-CM | POA: Diagnosis not present

## 2019-05-20 DIAGNOSIS — Z79899 Other long term (current) drug therapy: Secondary | ICD-10-CM | POA: Diagnosis not present

## 2019-05-20 DIAGNOSIS — F17210 Nicotine dependence, cigarettes, uncomplicated: Secondary | ICD-10-CM | POA: Diagnosis not present

## 2019-05-20 DIAGNOSIS — E7800 Pure hypercholesterolemia, unspecified: Secondary | ICD-10-CM | POA: Diagnosis not present

## 2019-05-20 DIAGNOSIS — I10 Essential (primary) hypertension: Secondary | ICD-10-CM | POA: Diagnosis not present

## 2019-05-20 DIAGNOSIS — R0602 Shortness of breath: Secondary | ICD-10-CM | POA: Diagnosis not present

## 2019-05-20 DIAGNOSIS — Z1159 Encounter for screening for other viral diseases: Secondary | ICD-10-CM | POA: Diagnosis not present

## 2019-05-26 DIAGNOSIS — M79604 Pain in right leg: Secondary | ICD-10-CM | POA: Diagnosis not present

## 2019-05-26 DIAGNOSIS — M79605 Pain in left leg: Secondary | ICD-10-CM | POA: Diagnosis not present

## 2019-05-30 DIAGNOSIS — R0602 Shortness of breath: Secondary | ICD-10-CM | POA: Diagnosis not present

## 2019-05-30 DIAGNOSIS — I6350 Cerebral infarction due to unspecified occlusion or stenosis of unspecified cerebral artery: Secondary | ICD-10-CM | POA: Diagnosis not present

## 2019-05-30 DIAGNOSIS — R9431 Abnormal electrocardiogram [ECG] [EKG]: Secondary | ICD-10-CM | POA: Diagnosis not present

## 2019-06-01 DIAGNOSIS — E119 Type 2 diabetes mellitus without complications: Secondary | ICD-10-CM | POA: Diagnosis not present

## 2019-06-01 DIAGNOSIS — Z79899 Other long term (current) drug therapy: Secondary | ICD-10-CM | POA: Diagnosis not present

## 2019-06-01 DIAGNOSIS — L0390 Cellulitis, unspecified: Secondary | ICD-10-CM | POA: Diagnosis not present

## 2019-06-01 DIAGNOSIS — M25561 Pain in right knee: Secondary | ICD-10-CM | POA: Diagnosis not present

## 2019-06-01 DIAGNOSIS — F17210 Nicotine dependence, cigarettes, uncomplicated: Secondary | ICD-10-CM | POA: Diagnosis not present

## 2019-06-01 DIAGNOSIS — M25552 Pain in left hip: Secondary | ICD-10-CM | POA: Diagnosis not present

## 2019-06-06 DIAGNOSIS — I10 Essential (primary) hypertension: Secondary | ICD-10-CM | POA: Diagnosis not present

## 2019-06-06 DIAGNOSIS — L03115 Cellulitis of right lower limb: Secondary | ICD-10-CM | POA: Diagnosis not present

## 2019-06-06 DIAGNOSIS — F17210 Nicotine dependence, cigarettes, uncomplicated: Secondary | ICD-10-CM | POA: Diagnosis not present

## 2019-06-06 DIAGNOSIS — E119 Type 2 diabetes mellitus without complications: Secondary | ICD-10-CM | POA: Diagnosis not present

## 2019-06-13 DIAGNOSIS — Z122 Encounter for screening for malignant neoplasm of respiratory organs: Secondary | ICD-10-CM | POA: Diagnosis not present

## 2019-06-14 DIAGNOSIS — Z1211 Encounter for screening for malignant neoplasm of colon: Secondary | ICD-10-CM | POA: Diagnosis not present

## 2019-07-01 DIAGNOSIS — Z79899 Other long term (current) drug therapy: Secondary | ICD-10-CM | POA: Diagnosis not present

## 2019-07-01 DIAGNOSIS — M545 Low back pain: Secondary | ICD-10-CM | POA: Diagnosis not present

## 2019-07-01 DIAGNOSIS — F17210 Nicotine dependence, cigarettes, uncomplicated: Secondary | ICD-10-CM | POA: Diagnosis not present

## 2019-07-01 DIAGNOSIS — L03119 Cellulitis of unspecified part of limb: Secondary | ICD-10-CM | POA: Diagnosis not present

## 2019-07-01 DIAGNOSIS — M25552 Pain in left hip: Secondary | ICD-10-CM | POA: Diagnosis not present

## 2019-07-01 DIAGNOSIS — M25561 Pain in right knee: Secondary | ICD-10-CM | POA: Diagnosis not present

## 2019-07-06 DIAGNOSIS — L97919 Non-pressure chronic ulcer of unspecified part of right lower leg with unspecified severity: Secondary | ICD-10-CM | POA: Diagnosis not present

## 2019-07-06 DIAGNOSIS — R0602 Shortness of breath: Secondary | ICD-10-CM | POA: Diagnosis not present

## 2019-07-06 DIAGNOSIS — F17210 Nicotine dependence, cigarettes, uncomplicated: Secondary | ICD-10-CM | POA: Diagnosis not present

## 2019-07-06 DIAGNOSIS — L97929 Non-pressure chronic ulcer of unspecified part of left lower leg with unspecified severity: Secondary | ICD-10-CM | POA: Diagnosis not present

## 2019-07-06 DIAGNOSIS — R9431 Abnormal electrocardiogram [ECG] [EKG]: Secondary | ICD-10-CM | POA: Diagnosis not present

## 2019-07-18 ENCOUNTER — Encounter (HOSPITAL_BASED_OUTPATIENT_CLINIC_OR_DEPARTMENT_OTHER): Payer: PPO | Admitting: Internal Medicine

## 2019-07-27 DIAGNOSIS — Z79899 Other long term (current) drug therapy: Secondary | ICD-10-CM | POA: Diagnosis not present

## 2019-07-27 DIAGNOSIS — M25552 Pain in left hip: Secondary | ICD-10-CM | POA: Diagnosis not present

## 2019-07-27 DIAGNOSIS — E119 Type 2 diabetes mellitus without complications: Secondary | ICD-10-CM | POA: Diagnosis not present

## 2019-07-27 DIAGNOSIS — F17210 Nicotine dependence, cigarettes, uncomplicated: Secondary | ICD-10-CM | POA: Diagnosis not present

## 2019-07-27 DIAGNOSIS — M25561 Pain in right knee: Secondary | ICD-10-CM | POA: Diagnosis not present

## 2019-07-27 DIAGNOSIS — M545 Low back pain: Secondary | ICD-10-CM | POA: Diagnosis not present

## 2019-08-01 ENCOUNTER — Encounter (HOSPITAL_BASED_OUTPATIENT_CLINIC_OR_DEPARTMENT_OTHER): Payer: PPO | Admitting: Internal Medicine

## 2019-08-01 ENCOUNTER — Other Ambulatory Visit: Payer: Self-pay

## 2019-08-01 DIAGNOSIS — W228XXA Striking against or struck by other objects, initial encounter: Secondary | ICD-10-CM | POA: Diagnosis not present

## 2019-08-01 DIAGNOSIS — Z8679 Personal history of other diseases of the circulatory system: Secondary | ICD-10-CM | POA: Diagnosis not present

## 2019-08-01 DIAGNOSIS — Z8249 Family history of ischemic heart disease and other diseases of the circulatory system: Secondary | ICD-10-CM | POA: Diagnosis not present

## 2019-08-01 DIAGNOSIS — Z6841 Body Mass Index (BMI) 40.0 and over, adult: Secondary | ICD-10-CM | POA: Insufficient documentation

## 2019-08-01 DIAGNOSIS — E669 Obesity, unspecified: Secondary | ICD-10-CM | POA: Diagnosis not present

## 2019-08-01 DIAGNOSIS — E11622 Type 2 diabetes mellitus with other skin ulcer: Secondary | ICD-10-CM | POA: Diagnosis not present

## 2019-08-01 DIAGNOSIS — S81801A Unspecified open wound, right lower leg, initial encounter: Secondary | ICD-10-CM | POA: Diagnosis not present

## 2019-08-01 DIAGNOSIS — I87331 Chronic venous hypertension (idiopathic) with ulcer and inflammation of right lower extremity: Secondary | ICD-10-CM | POA: Insufficient documentation

## 2019-08-01 DIAGNOSIS — E1142 Type 2 diabetes mellitus with diabetic polyneuropathy: Secondary | ICD-10-CM | POA: Insufficient documentation

## 2019-08-01 DIAGNOSIS — L97812 Non-pressure chronic ulcer of other part of right lower leg with fat layer exposed: Secondary | ICD-10-CM | POA: Diagnosis not present

## 2019-08-01 DIAGNOSIS — L853 Xerosis cutis: Secondary | ICD-10-CM | POA: Diagnosis not present

## 2019-08-01 DIAGNOSIS — L97212 Non-pressure chronic ulcer of right calf with fat layer exposed: Secondary | ICD-10-CM | POA: Diagnosis not present

## 2019-08-01 DIAGNOSIS — M1990 Unspecified osteoarthritis, unspecified site: Secondary | ICD-10-CM | POA: Insufficient documentation

## 2019-08-01 DIAGNOSIS — Z86718 Personal history of other venous thrombosis and embolism: Secondary | ICD-10-CM | POA: Diagnosis not present

## 2019-08-01 DIAGNOSIS — Z885 Allergy status to narcotic agent status: Secondary | ICD-10-CM | POA: Insufficient documentation

## 2019-08-01 DIAGNOSIS — I872 Venous insufficiency (chronic) (peripheral): Secondary | ICD-10-CM | POA: Diagnosis not present

## 2019-08-01 DIAGNOSIS — Z96642 Presence of left artificial hip joint: Secondary | ICD-10-CM | POA: Diagnosis not present

## 2019-08-01 DIAGNOSIS — I10 Essential (primary) hypertension: Secondary | ICD-10-CM | POA: Insufficient documentation

## 2019-08-01 DIAGNOSIS — Z833 Family history of diabetes mellitus: Secondary | ICD-10-CM | POA: Diagnosis not present

## 2019-08-01 DIAGNOSIS — F17210 Nicotine dependence, cigarettes, uncomplicated: Secondary | ICD-10-CM | POA: Diagnosis not present

## 2019-08-01 NOTE — Progress Notes (Signed)
Dennis Wyatt, Dennis Wyatt ([id number]) Visit Report for 08/01/2019 Abuse/Suicide Risk Screen Details Patient Name: Date of Service: Dennis Wyatt, Dennis Wyatt 08/01/2019 1:15 PM Medical Record [id number] Patient Account Number: [id number] Date of Birth/Sex: Treating RN: 10-Nov-1960 (59 y.o. Male) Baruch Gouty Primary Care Elliot Simoneaux: [name] Other Clinician: Referring Kellin Fifer: Treating Ayleah Hofmeister/Extender:[name], [name], [name] Weeks in Treatment: 0 Abuse/Suicide Risk Screen Items Answer ABUSE RISK SCREEN: Has anyone close to you tried to hurt or harm you recentlyo No Do you feel uncomfortable with anyone in your familyo No Has anyone forced you do things that you didnt want to doo No Electronic Signature(s) Signed: 08/01/2019 5:35:37 PM By: Baruch Gouty RN, BSN Entered By: Baruch Gouty on 08/01/2019 14:07:03 -------------------------------------------------------------------------------- Activities of Daily Living Details Patient Name: Date of Service: Dennis Wyatt, Dennis Wyatt 08/01/2019 1:15 PM Medical Record [id number] Patient Account Number: [id number] Date of Birth/Sex: Treating RN: 03/11/1961 (59 y.o. Male) Baruch Gouty Primary Care Sadhana Frater: [name] Other Clinician: Referring Quoc Tome: Treating Kloey Cazarez/Extender:[name], [name], [name] Weeks in Treatment: 0 Activities of Daily Living Items Answer Activities of Daily Living (Please select one for each item) Drive Automobile Completely Able Take Medications Completely Able Use Telephone Completely Able Care for Appearance Completely Able Use Toilet Completely Able Bath / Shower Completely Able Dress Self Completely Able Feed Self Completely Able Walk Completely Able Get In / Out Bed Completely Able Housework Completely Able Prepare Meals Completely Able Handle Money Completely Able Shop for Self Completely Able Electronic Signature(s) Signed: 08/01/2019 5:35:37 PM By:  Baruch Gouty RN, BSN Entered By: Baruch Gouty on 08/01/2019 14:07:28 -------------------------------------------------------------------------------- Education Screening Details Patient Name: Date of Service: Dennis Wyatt, Dennis Wyatt 08/01/2019 1:15 PM Medical Record [id number] Patient Account Number: [id number] Date of Birth/Sex: Treating RN: 1961-01-04 (59 y.o. Male) Baruch Gouty Primary Care Raseel Jans: [name] Other Clinician: Referring Arash Karstens: Treating Davine Coba/Extender:[name], [name], [name] Weeks in Treatment: 0 Primary Learner Assessed: Patient Learning Preferences/Education Level/Primary Language Learning Preference: Explanation, Demonstration, Printed Material Highest Education Level: High School Preferred Language: English Cognitive Barrier Language Barrier: No Translator Needed: No Memory Deficit: No Emotional Barrier: No Cultural/Religious Beliefs Affecting Medical Care: No Physical Barrier Impaired Vision: Yes Glasses Impaired Hearing: No Decreased Hand dexterity: No Knowledge/Comprehension Knowledge Level: High Comprehension Level: High Ability to understand written High instructions: Ability to understand verbal High instructions: Motivation Anxiety Level: Calm Cooperation: Cooperative Education Importance: Acknowledges Need Interest in Health Problems: Asks Questions Perception: Coherent Willingness to Engage in Self- High Management Activities: Readiness to Engage in Self- High Management Activities: Electronic Signature(s) Signed: 08/01/2019 5:35:37 PM By: Baruch Gouty RN, BSN Entered By: Baruch Gouty on 08/01/2019 14:07:56 -------------------------------------------------------------------------------- Fall Risk Assessment Details Patient Name: Date of Service: Dennis Wyatt, Dennis Wyatt 08/01/2019 1:15 PM Medical Record [id number] Patient Account Number: [id number] Date of Birth/Sex: Treating RN: [birth year]  (59 y.o. Male) Baruch Gouty Primary Care Michalla Ringer: [name] Other Clinician: Referring Ettore Trebilcock: Treating Dorwin Fitzhenry/Extender:[name], [name], [name] Weeks in Treatment: 0 Fall Risk Assessment Items Have you had 2 or more falls in the last 12 monthso 0 Yes Have you had any fall that resulted in injury in the last 12 monthso 0 No FALLS RISK SCREEN History of falling - immediate or within 3 months 25 Yes Secondary diagnosis (Do you have 2 or more medical diagnoseso) 0 No Ambulatory aid None/bed rest/wheelchair/nurse 0 No Crutches/cane/walker 15 Yes Furniture 0 No Intravenous therapy Access/Saline/Heparin Lock 0 No Weak (short steps with or without shuffle, stooped but able to lift head 10 Yes while walking, may seek support  from furniture) Impaired (short steps with shuffle, may have difficulty arising from chair, 0 No head down, impaired balance) Mental Status Oriented to own ability 0 Yes Overestimates or forgets limitations 0 No Risk Level: Medium Risk Score: 50 Electronic Signature(s) Signed: 08/01/2019 5:35:37 PM By: Zenaida Deed RN, BSN Entered By: Zenaida Deed on 08/01/2019 14:08:46 -------------------------------------------------------------------------------- Foot Assessment Details Patient Name: Date of Service: Dennis Wyatt, Dennis Wyatt 08/01/2019 1:15 PM Medical Record [id number] Patient Account Number: [id number] Date of Birth/Sex: Treating RN: 07/31/1960 (58 y.o. Male) Zenaida Deed Primary Care Deroy Noah: [name] Other Clinician: Referring Trinita Devlin: Treating Aamna Mallozzi/Extender:[name], [name], [name]: 0 Foot Assessment Items Site Locations + = Sensation present, - = Sensation absent, C = Callus, U = Ulcer R = Redness, W = Warmth, M = Maceration, PU = Pre-ulcerative lesion F = Fissure, S = Swelling, D = Dryness Assessment Right: Left: Other Deformity: No No Prior Foot Ulcer: No  No Prior Amputation: No No Charcot Joint: No No Ambulatory Status: Ambulatory With Help Assistance Device: Cane Gait: Steady Electronic Signature(s) Signed: 08/01/2019 5:35:37 PM By: Zenaida Deed RN, BSN Entered By: Zenaida Deed on 08/01/2019 14:13:04 -------------------------------------------------------------------------------- Nutrition Risk Screening Details Patient Name: Date of Service: Dennis Wyatt, Dennis Wyatt 08/01/2019 1:15 PM Medical Record [id number] Patient Account Number: [id number] Date of Birth/Sex: Treating RN: [birth year] (58 y.o. Male) Zenaida Deed Primary Care Leviticus Harton: [name] Other Clinician: Referring Darl Kuss: Treating Ziah Leandro/Extender:[name], [name], [name]: 0 Height (in): Weight (lbs): Body Mass Index (BMI): Nutrition Risk Screening Items Score Screening NUTRITION RISK SCREEN: I have an illness or condition that made me change the kind and/or 0 No amount of food I eat I eat fewer than two meals per day 0 No I eat few fruits and vegetables, or milk products 0 No I have three or more drinks of beer, liquor or wine almost every day 0 No I have tooth or mouth problems that make it hard for me to eat 2 Yes I don't always have enough money to buy the food I need 0 No I eat alone most of the time 1 Yes I take three or more different prescribed or over-the-counter drugs a day 1 Yes 2 Yes Without wanting to, I have lost or gained 10 pounds in the last six months I am not always physically able to shop, cook and/or feed myself 0 No Nutrition Protocols Good Risk Protocol Moderate Risk Protocol Provide education on elevated blood sugars and High Risk Proctocol 0 impact on wound healing, as applicable Risk Level: High Risk Score: 6 Electronic Signature(s) Signed: 08/01/2019 5:35:37 PM By: Zenaida Deed RN, BSN Entered By: Zenaida Deed on 08/01/2019 14:11:21

## 2019-08-02 NOTE — Progress Notes (Signed)
Dennis Wyatt, Dennis Wyatt ([id number]) Visit Report for 08/01/2019 Chief Complaint Document Details Patient Name: Date of Service: Dennis Wyatt 08/01/2019 1:15 PM Medical Record [id number] Patient Account Number: [id number] Date of Birth/Sex: Treating RN: 08/01/1960 (59 y.o. Male) Primary Care Provider: Leonard Wyatt Other Clinician: Referring Provider: Treating Provider/Extender:Dennis Wyatt, Dennis Wyatt, Dennis Wyatt: 0 Information Obtained from: Patient Chief Complaint 08/01/2019; patient is here for review of 3 wounds on his right lower leg Electronic Signature(s) Signed: 08/02/2019 4:08:35 PM By: Dennis Ham MD Entered By: Dennis Wyatt on 08/01/2019 15:43:52 -------------------------------------------------------------------------------- Debridement Details Patient Name: Date of Service: Dennis Wyatt, Dennis Wyatt 08/01/2019 1:15 PM Medical Record [id number] Patient Account Number: [id number] Date of Birth/Sex: Oct 14, 1960 (58 y.o. Male) Treating RN: Primary Care Provider: Leonard Wyatt Other Clinician: Referring Provider: Treating Provider/Extender:Dennis Wyatt, Dennis Wyatt, Dennis Wyatt: 0 Debridement Performed for Wound #1 Right,Anterior Lower Leg Assessment: Performed By: Physician Dennis Dillon., MD Debridement Type: Debridement Severity of Tissue Pre Fat layer exposed Debridement: Level of Consciousness (Pre- Awake and Alert procedure): Pre-procedure Verification/Time Out Taken: Yes - 15:10 Start Time: 15:11 Pain Control: Other : Benzocaine 20% Total Area Debrided (L x W): 3.7 (cm) x 2.8 (cm) = 10.36 (cm) Tissue and other material Viable, Non-Viable, Slough, Subcutaneous, Slough debrided: Level: Skin/Subcutaneous Tissue Debridement Description: Excisional Instrument: Curette Bleeding: Moderate Hemostasis Achieved: Pressure End Time: 15:12 Procedural Pain: 0 Post Procedural Pain: 0 Response to Wyatt:  Procedure was tolerated well Level of Consciousness Awake and Alert (Post-procedure): Post Debridement Measurements of Total Wound Length: (cm) 3.7 Width: (cm) 2.8 Depth: (cm) 0.2 Volume: (cm) 1.627 Character of Wound/Ulcer Post Requires Further Debridement Debridement: Severity of Tissue Post Debridement: Fat layer exposed Post Procedure Diagnosis Same as Pre-procedure Electronic Signature(s) Signed: 08/02/2019 4:08:35 PM By: Dennis Ham MD Entered By: Dennis Wyatt on 08/01/2019 15:43:29 -------------------------------------------------------------------------------- Debridement Details Patient Name: Date of Service: Dennis Wyatt, Dennis Wyatt 08/01/2019 1:15 PM Medical Record [id number] Patient Account Number: [id number] Date of Birth/Sex: Nov 15, 1960 (58 y.o. Male) Treating RN: Primary Care Provider: Leonard Wyatt Other Clinician: Referring Provider: Treating Provider/Extender:Dennis Wyatt, Dennis Wyatt, Dennis Wyatt: 0 Debridement Performed for Wound #2 Right,Lateral Lower Leg Assessment: Performed By: Physician Dennis Dillon., MD Debridement Type: Debridement Severity of Tissue Pre Fat layer exposed Debridement: Level of Consciousness (Pre- Awake and Alert procedure): Pre-procedure Verification/Time Out Taken: Yes - 15:10 Start Time: 15:10 Pain Control: Other : Benzocaine 20% Total Area Debrided (L x W): 2.8 (cm) x 1.5 (cm) = 4.2 (cm) Tissue and other material Viable, Non-Viable, Slough, Subcutaneous, Slough debrided: Level: Skin/Subcutaneous Tissue Debridement Description: Excisional Instrument: Curette Bleeding: Moderate Hemostasis Achieved: Pressure End Time: 15:11 Procedural Pain: 0 Post Procedural Pain: 0 Response to Wyatt: Procedure was tolerated well Level of Consciousness Awake and Alert (Post-procedure): Post Debridement Measurements of Total Wound Length: (cm) 2.8 Width: (cm) 1.5 Depth: (cm) 0.3 Volume: (cm)  0.99 Character of Wound/Ulcer Post Requires Further Debridement Debridement: Severity of Tissue Post Debridement: Fat layer exposed Post Procedure Diagnosis Same as Pre-procedure Electronic Signature(s) Signed: 08/02/2019 4:08:35 PM By: Dennis Ham MD Entered By: Dennis Wyatt on 08/01/2019 15:43:35 -------------------------------------------------------------------------------- HPI Details Patient Name: Date of Service: Dennis Wyatt, Dennis Wyatt 08/01/2019 1:15 PM Medical Record [id number] Patient Account Number: [id number] Date of Birth/Sex: Treating RN: 10-08-1960 (59 y.o. Male) Primary Care Provider: Leonard Wyatt Other Clinician: Referring Provider: Treating Provider/Extender:Dennis Wyatt, Dennis Wyatt, Dennis Wyatt: 0 History of Present Illness HPI Description: ADMISSION 08/01/2019 This is a 59 year old man who is essentially self-referred although he  [facility] We did not have any information from them. He is here for review of 3 wounds on the right lower leg. He states about a month and a half ago he dropped his cell phone on the right anterior leg creating a wound. More recently he has had 2 blisters come out 1 medially just above the lateral malleolus and one on the posterior calf. All of these are roughly the same condition however. He has been applying some form of ointment were not really sure what this is. He does not have a wound history. 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 ABI in our clinic was 0.95 Electronic Signature(s) Signed: 08/02/2019 4:08:35 PM By: Dennis Wyatt, Dennis Belcher MD Entered By: Dennis Wyatt, Kaine Mcquillen on 08/01/2019  15:47:29 -------------------------------------------------------------------------------- Physical Exam Details Patient Name: Date of Service: Dennis Wyatt 08/01/2019 1:15 PM Medical Record [id number] Patient Account Number: [id number] Date of Birth/Sex: Treating RN: 12-Nov-1960 (58 y.o. Male) Primary Care Provider: [name], Wilson Oliver Other Clinician: Referring Provider: Treating Provider/Extender:Jalila Goodnough, Maeola SarahMichael Elkins, Curly RimWilson Oliver Weeks in Wy[place]: 0 Constitutional Sitting or standing Blood Pressure is within target range for patient.. Pulse regular and within target range for patient.Marland Kitchen. Respirations regular, non-labored and within target range.. Temperature is normal and within the target range for the patient.Marland Kitchen. Appears in no distress. Eyes Conjunctivae clear. No discharge.no icterus. Respiratory work of breathing is normal. Shallow air entry no crackles or wheezes. Cardiovascular Heart rhythm and rate regular, without murmur or gallop.. Dorsalis pedis posterior tibial and popliteal pulses are all palpable. Integumentary (Hair, Skin) Severely dry skin in the lower extremities with changes of chronic venous insert sufficiency. Dermal fibrosis in the lower legs. Psychiatric appears at normal baseline. Notes Wound exam Major wound is on the anterior lower tibia. Total surface covered and tightly adherent fibrinous debris debrided with a #5 curette. Hemostasis with direct pressure. He has a second wound in the similar condition just above the lateral malleolus on the lower leg. Also debrided with a #5 curette The area on the posterior calf is smaller but still with surface debris Electronic Signature(s) Signed: 08/02/2019 4:08:35 PM By: Dennis Wyatt, Cherina Dhillon MD Entered By: Dennis Wyatt, Jaedyn Marrufo on 08/01/2019 15:51:42 -------------------------------------------------------------------------------- Physician Orders Details Patient Name: Date of Service: Dennis HowHOMAS, Ameya 08/01/2019  1:15 PM Medical Record [id number] Patient Account Number: [id number] Date of Birth/Sex: Treating RN: 12-Nov-1960 (58 y.o. Male) Zandra AbtsLynch, Shatara Primary Care Provider: [name], Wilson Oliver Other Clinician: Referring Provider: Treating Provider/Extender:Tarquin Welcher, Maeola SarahMichael Elkins, Curly RimWilson Oliver Weeks in Wyatt: 0 Verbal / Phone Orders: No Diagnosis Coding Follow-up Appointments Return Appointment in 1 week. Dressing Change Frequency Do not change entire dressing for one week. Skin Barriers/Peri-Wound Care Moisturizing lotion TCA Cream or Ointment - mixed with lotion Wound Cleansing May shower with protection. - may use cast protector Primary Wound Dressing Wound #1 Right,Anterior Lower Leg Iodoflex Wound #2 Right,Lateral Lower Leg Iodoflex Wound #3 Right,Posterior Lower Leg Iodoflex Secondary Dressing Wound #1 Right,Anterior Lower Leg ABD pad Zetuvit or Kerramax Wound #2 Right,Lateral Lower Leg ABD pad Zetuvit or Kerramax Wound #3 Right,Posterior Lower Leg ABD pad Zetuvit or Kerramax Edema Control 3 Layer Compression System - Right Lower Extremity Avoid standing for long periods of time Elevate legs to the level of the heart or above for 30 minutes daily and/or when sitting, a frequency of: - throughout the day Exercise regularly Electronic Signature(s) Signed:  08/01/2019 6:02:32 PM By: Zandra AbtsLynch, Shatara RN, BSN Signed: 08/02/2019 4:08:35 PM By: Dennis Wyatt, Damont Balles MD Entered By: Zandra AbtsLynch, Shatara on 08/01/2019 15:14:08 -------------------------------------------------------------------------------- Problem List Details Patient Name: Date of Service: Dennis HowHOMAS, Byan 08/01/2019 1:15 PM Medical Record [id number] Patient Account Number: [id number] Date of Birth/Sex: Treating RN: 09/06/1960 (58 y.o. Male) Primary Care Provider: [name], Wilson Oliver Other Clinician: Referring Provider: Treating Provider/Extender:Duane Earnshaw, Maeola SarahMichael Elkins, Curly RimWilson Oliver Weeks in Wyatt:  0 Active Problems ICD-10 Evaluated Encounter Code Description Active Date Today Diagnosis L97.812 Non-pressure chronic ulcer of other part of right lower 08/01/2019 No Yes leg with fat layer exposed I87.331 Chronic venous hypertension (idiopathic) with ulcer 08/01/2019 No Yes and inflammation of right lower extremity E11.622 Type 2 diabetes mellitus with other skin ulcer 08/01/2019 No Yes E11.42 Type 2 diabetes mellitus with diabetic polyneuropathy 08/01/2019 No Yes Inactive Problems Resolved Problems Electronic Signature(s) Signed: 08/02/2019 4:08:35 PM By: Dennis Wyatt, Gertrude Bucks MD Entered By: Dennis Wyatt, Mattis Featherly on 08/01/2019 15:22:05 -------------------------------------------------------------------------------- Progress Note Details Patient Name: Date of Service: Dennis HowHOMAS, Ottavio 08/01/2019 1:15 PM Medical Record [id number] Patient Account Number: [id number] Date of Birth/Sex: Treating RN: 09/06/1960 (58 y.o. Male) Primary Care Provider: Other Clinician: [name], Wilson Oliver Referring Provider: Treating Provider/Extender:Emmert Roethler, Maeola SarahMichael Elkins, Curly RimWilson Oliver Weeks in Wy[place]: 0 Subjective Chief Complaint Information obtained from Patient 08/01/2019; patient is here for review of 3 wounds on his right lower leg History of Present Illness (HPI) ADMISSION 08/01/2019 This is a 59 year old man who is essentially self-referred although [facility] We did not have any information from them. He is here for review of 3 wounds on the right lower leg. He states about a month and a half ago he dropped his cell phone on the right anterior leg creating a wound. More recently he has had 2 blisters come out 1 medially just above the lateral malleolus and one on the posterior calf. All of these are roughly the same condition however. He has been applying some form of ointment were not really sure what this is. He does not have a wound history. 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 ABI in our clinic was 0.95 Patient History Information obtained from Patient. Allergies oxycodone (Reaction: unknown) Family History Cancer - Father,Paternal Grandparents, Diabetes - Paternal Grandparents, Hypertension - Mother,Father,Paternal Grandparents, Stroke - Paternal Grandparents, Thyroid Problems - Siblings, No family history of Heart Disease, Hereditary Spherocytosis, Kidney Disease, Lung Disease, Tuberculosis. Social History Current every day smoker - 1/2 ppd, Marital Status - Divorced, Alcohol Use - Never, Drug Use - No History, Caffeine Use - Daily - coffe, tea, red bull. Medical History Ear/Nose/Mouth/Throat Denies history of Chronic sinus problems/congestion, Middle ear problems Cardiovascular Patient has history of Deep Vein Thrombosis, Hypertension Endocrine Patient has history of Type II Diabetes Denies history of Type I Diabetes Integumentary (Skin) Denies history of History of Burn Musculoskeletal Patient has history of Osteoarthritis Denies history of Gout, Rheumatoid Arthritis, Osteomyelitis Oncologic Denies history of Received Chemotherapy, Received Radiation Psychiatric Denies history of Anorexia/bulimia, Confinement Anxiety Patient is treated with Insulin. Blood sugar is not tested. Hospitalization/Surgery History - left hip arthroplasty. Medical And Surgical History Notes Constitutional Symptoms (General Health) obesity Gastrointestinal GERD Neurologic subarachnoid hemorrhage Review of Systems (ROS) Constitutional Symptoms (General Health) Denies complaints or symptoms of Fatigue, Fever, Chills, Marked Weight Change. Eyes Complains or has symptoms of Glasses / Contacts. Denies  complaints or symptoms of Dry Eyes, Vision  Changes. Ear/Nose/Mouth/Throat Denies complaints or symptoms of Chronic sinus problems or rhinitis. Respiratory Denies complaints or symptoms of Chronic or frequent coughs, Shortness of Breath. Cardiovascular Denies complaints or symptoms of Chest pain. Gastrointestinal Denies complaints or symptoms of Frequent diarrhea, Nausea, Vomiting. Endocrine Denies complaints or symptoms of Heat/cold intolerance. Genitourinary Denies complaints or symptoms of Frequent urination. Integumentary (Skin) Complains or has symptoms of Wounds - right lower leg. Musculoskeletal Denies complaints or symptoms of Muscle Pain, Muscle Weakness. Neurologic Complains or has symptoms of Numbness/parasthesias - intermittant. Psychiatric Denies complaints or symptoms of Claustrophobia, Suicidal. Objective Constitutional Sitting or standing Blood Pressure is within target range for patient.. Pulse regular and within target range for patient.Marland Kitchen Respirations regular, non-labored and within target range.. Temperature is normal and within the target range for the patient.Marland Kitchen Appears in no distress. Vitals Time Taken: 1:42 PM, Temperature: 98.7 F, Pulse: 66 bpm, Respiratory Rate: 20 breaths/min, Blood Pressure: 126/54 mmHg. Eyes Conjunctivae clear. No discharge.no icterus. Respiratory work of breathing is normal. Shallow air entry no crackles or wheezes. Cardiovascular Heart rhythm and rate regular, without murmur or gallop.. Dorsalis pedis posterior tibial and popliteal pulses are all palpable. Psychiatric appears at normal baseline. General Notes: Wound exam ooMajor wound is on the anterior lower tibia. Total surface covered and tightly adherent fibrinous debris debrided with a #5 curette. Hemostasis with direct pressure. ooHe has a second wound in the similar condition just above the lateral malleolus on the lower leg. Also debrided with a #5 curette ooThe area on the posterior calf is smaller but still  with surface debris Integumentary (Hair, Skin) Severely dry skin in the lower extremities with changes of chronic venous insert sufficiency. Dermal fibrosis in the lower legs. Wound #1 status is Open. Original cause of wound was Trauma. The wound is located on the Right,Anterior Lower Leg. The wound measures 3.7cm length x 2.8cm width x 0.2cm depth; 8.137cm^2 area and 1.627cm^3 volume. There is Fat Layer (Subcutaneous Tissue) Exposed exposed. There is no tunneling or undermining noted. There is a medium amount of serous drainage noted. The wound margin is flat and intact. There is medium (34-66%) red granulation within the wound bed. There is a medium (34-66%) amount of necrotic tissue within the wound bed including Adherent Slough. Wound #2 status is Open. Original cause of wound was Gradually Appeared. The wound is located on the Right,Lateral Lower Leg. The wound measures 2.8cm length x 1.5cm width x 0.3cm depth; 3.299cm^2 area and 0.99cm^3 volume. There is Fat Layer (Subcutaneous Tissue) Exposed exposed. There is no tunneling or undermining noted. There is a medium amount of serosanguineous drainage noted. The wound margin is flat and intact. There is small (1-33%) red granulation within the wound bed. There is a large (67-100%) amount of necrotic tissue within the wound bed including Adherent Slough. Wound #3 status is Open. Original cause of wound was Gradually Appeared. The wound is located on the Right,Posterior Lower Leg. The wound measures 1.5cm length x 3cm width x 0.1cm depth; 3.534cm^2 area and 0.353cm^3 volume. There is Fat Layer (Subcutaneous Tissue) Exposed exposed. There is no tunneling or undermining noted. There is a medium amount of serosanguineous drainage noted. The wound margin is flat and intact. There is medium (34-66%) red granulation within the wound bed. There is a medium (34-66%) amount of necrotic tissue within the wound bed including Adherent  Slough. Assessment Active Problems ICD-10 Non-pressure chronic ulcer of other part of right lower leg with fat  layer exposed Chronic venous hypertension (idiopathic) with ulcer and inflammation of right lower extremity Type 2 diabetes mellitus with other skin ulcer Type 2 diabetes mellitus with diabetic polyneuropathy Procedures Wound #1 Pre-procedure diagnosis of Wound #1 is a Venous Leg Ulcer located on the Right,Anterior Lower Leg .Severity of Tissue Pre Debridement is: Fat layer exposed. There was a Excisional Skin/Subcutaneous Tissue Debridement with a total area of 10.36 sq cm performed by Maxwell Caul., MD. With the following instrument(s): Curette to remove Viable and Non-Viable tissue/material. Material removed includes Subcutaneous Tissue and Slough and after achieving pain control using Other (Benzocaine 20%). No specimens were taken. A time out was conducted at 15:10, prior to the start of the procedure. A Moderate amount of bleeding was controlled with Pressure. The procedure was tolerated well with a pain level of 0 throughout and a pain level of 0 following the procedure. Post Debridement Measurements: 3.7cm length x 2.8cm width x 0.2cm depth; 1.627cm^3 volume. Character of Wound/Ulcer Post Debridement requires further debridement. Severity of Tissue Post Debridement is: Fat layer exposed. Post procedure Diagnosis Wound #1: Same as Pre-Procedure Pre-procedure diagnosis of Wound #1 is a Venous Leg Ulcer located on the Right,Anterior Lower Leg . There was a Three Layer Compression Therapy Procedure by Zandra Abts, RN. Post procedure Diagnosis Wound #1: Same as Pre-Procedure Wound #2 Pre-procedure diagnosis of Wound #2 is a Venous Leg Ulcer located on the Right,Lateral Lower Leg .Severity of Tissue Pre Debridement is: Fat layer exposed. There was a Excisional Skin/Subcutaneous Tissue Debridement with a total area of 4.2 sq cm performed by Maxwell Caul., MD. With  the following instrument(s): Curette to remove Viable and Non-Viable tissue/material. Material removed includes Subcutaneous Tissue and Slough and after achieving pain control using Other (Benzocaine 20%). No specimens were taken. A time out was conducted at 15:10, prior to the start of the procedure. A Moderate amount of bleeding was controlled with Pressure. The procedure was tolerated well with a pain level of 0 throughout and a pain level of 0 following the procedure. Post Debridement Measurements: 2.8cm length x 1.5cm width x 0.3cm depth; 0.99cm^3 volume. Character of Wound/Ulcer Post Debridement requires further debridement. Severity of Tissue Post Debridement is: Fat layer exposed. Post procedure Diagnosis Wound #2: Same as Pre-Procedure Pre-procedure diagnosis of Wound #2 is a Venous Leg Ulcer located on the Right,Lateral Lower Leg . There was a Three Layer Compression Therapy Procedure by Zandra Abts, RN. Post procedure Diagnosis Wound #2: Same as Pre-Procedure Wound #3 Pre-procedure diagnosis of Wound #3 is a Venous Leg Ulcer located on the Right,Posterior Lower Leg . There was a Three Layer Compression Therapy Procedure by Zandra Abts, RN. Post procedure Diagnosis Wound #3: Same as Pre-Procedure Plan Follow-up Appointments: Return Appointment in 1 week. Dressing Change Frequency: Do not change entire dressing for one week. Skin Barriers/Peri-Wound Care: Moisturizing lotion TCA Cream or Ointment - mixed with lotion Wound Cleansing: May shower with protection. - may use cast protector Primary Wound Dressing: Wound #1 Right,Anterior Lower Leg: Iodoflex Wound #2 Right,Lateral Lower Leg: Iodoflex Wound #3 Right,Posterior Lower Leg: Iodoflex Secondary Dressing: Wound #1 Right,Anterior Lower Leg: ABD pad Zetuvit or Kerramax Wound #2 Right,Lateral Lower Leg: ABD pad Zetuvit or Kerramax Wound #3 Right,Posterior Lower Leg: ABD pad Zetuvit or Kerramax Edema  Control: 3 Layer Compression System - Right Lower Extremity Avoid standing for long periods of time Elevate legs to the level of the heart or above for 30 minutes daily and/or when sitting, a frequency of: -  throughout the day Exercise regularly 1. TCA with moisturizing cream for severe stasis dermatitis and dry skin 2. Debridement as noted. We are going to use Iodoflex to see if we can clean up the wound surfaces under 3 layer compression 3. The patient's pulses are palpable although he does give a fairly odd description of the could possibly be claudication. 4. He has undiagnosed chronic venous insufficiency with tightly adherent fibrinous inflamed fibrotic skin in the distal lower extremity. He is going to need compression post healing his wounds. I did not order reflux studies for now Electronic Signature(s) Signed: 08/01/2019 3:52:37 PM By: Dennis Najjar MD Entered By: Dennis Najjar on 08/01/2019 15:52:37 -------------------------------------------------------------------------------- HxROS Details Patient Name: Date of Service: Dennis Wyatt, Dennis Wyatt 08/01/2019 1:15 PM Medical Record [id number] Patient Account Number: [id number] Date of Birth/Sex: Treating RN: 04/08/1961 (58 y.o. Male) Zenaida Deed Primary Care Provider: [name] Other Clinician: Referring Provider: Treating Provider/Extender:Mellody Masri, [name], [name] Weeks in Wy[place]: 0 Information Obtained From Patient Constitutional Symptoms (General Health) Complaints and Symptoms: Negative for: Fatigue; Fever; Chills; Marked Weight Change Medical History: Past Medical History Notes: obesity Eyes Complaints and Symptoms: Positive for: Glasses / Contacts Negative for: Dry Eyes; Vision Changes Ear/Nose/Mouth/Throat Complaints and Symptoms: Negative for: Chronic sinus problems or rhinitis Medical History: Negative for: Chronic sinus problems/congestion; Middle ear  problems Respiratory Complaints and Symptoms: Negative for: Chronic or frequent coughs; Shortness of Breath Cardiovascular Complaints and Symptoms: Negative for: Chest pain Medical History: Positive for: Deep Vein Thrombosis; Hypertension Gastrointestinal Complaints and Symptoms: Negative for: Frequent diarrhea; Nausea; Vomiting Medical History: Past Medical History Notes: GERD Endocrine Complaints and Symptoms: Negative for: Heat/cold intolerance Medical History: Positive for: Type II Diabetes Negative for: Type I Diabetes Time with diabetes: 2 months Treated with: Insulin Blood sugar tested every day: No Genitourinary Complaints and Symptoms: Negative for: Frequent urination Integumentary (Skin) Complaints and Symptoms: Positive for: Wounds - right lower leg Medical History: Negative for: History of Burn Musculoskeletal Complaints and Symptoms: Negative for: Muscle Pain; Muscle Weakness Medical History: Positive for: Osteoarthritis Negative for: Gout; Rheumatoid Arthritis; Osteomyelitis Neurologic Complaints and Symptoms: Positive for: Numbness/parasthesias - intermittant Medical History: Past Medical History Notes: subarachnoid hemorrhage Psychiatric Complaints and Symptoms: Negative for: Claustrophobia; Suicidal Medical History: Negative for: Anorexia/bulimia; Confinement Anxiety Hematologic/Lymphatic Immunological Oncologic Medical History: Negative for: Received Chemotherapy; Received Radiation Immunizations Pneumococcal Vaccine: Received Pneumococcal Vaccination: No Implantable Devices Yes Hospitalization / Surgery History Type of Hospitalization/Surgery left hip arthroplasty Family and Social History Cancer: Yes - Father,Paternal Grandparents; Diabetes: Yes - Paternal Grandparents; Heart Disease: No; Hereditary Spherocytosis: No; Hypertension: Yes - Mother,Father,Paternal Grandparents; Kidney Disease: No; Lung Disease: No; Stroke: Yes -  Paternal Grandparents; Thyroid Problems: Yes - Siblings; Tuberculosis: No; Current every day smoker - 1/2 ppd; Marital Status - Divorced; Alcohol Use: Never; Drug Use: No History; Caffeine Use: Daily - coffe, tea, red bull; Financial Concerns: No; Food, Clothing or Shelter Needs: No; Support System Lacking: No; Transportation Concerns: No Electronic Signature(s) Signed: 08/01/2019 5:35:37 PM By: Zenaida Deed RN, BSN Signed: 08/02/2019 4:08:35 PM By: Dennis Najjar MD Entered By: Zenaida Deed on 08/01/2019 14:06:45 -------------------------------------------------------------------------------- SuperBill Details Patient Name: Date of Service: Dennis Wyatt, Dennis Wyatt 08/01/2019 Medical Record Number:[id number] Patient Account Number: [id number] Date of Birth/Sex: Treating RN: 08-26-1960 (58 y.o. Male) Primary Care Provider: [name] Other Clinician: Referring Provider: Treating Provider/Extender:Braxten Memmer, [name], [name] Weeks in [place]: 0 Diagnosis Coding ICD-10 Codes Code Description [phone] Non-pressure chronic ulcer of other part of right lower leg with fat layer exposed  I87.331 Chronic venous hypertension (idiopathic) with ulcer and inflammation of right lower extremity E11.622 Type 2 diabetes mellitus with other skin ulcer E11.42 Type 2 diabetes mellitus with diabetic polyneuropathy Facility Procedures CPT4 Code Description: [id number] 99213 - WOUND CARE VISIT-LEV 3 EST PT Modifier: 25 Quantity: 1 CPT4 Code Description: [id number] 11042 - DEB SUBQ TISSUE 20 SQ CM/< ICD-10 Diagnosis Description L97.812 Non-pressure chronic ulcer of other part of right lower le Modifier: g with fat laye Quantity: 1 r exposed Physician Procedures CPT4: Code [id number] WC Description: PHYS LEVEL 3 NEW PT ICD-10 Diagnosis Description L97.812 Non-pressure chronic ulcer of other part of right lower leg I87.331 Chronic venous hypertension (idiopathic) with ulcer and inf extremity  E11.622 Type 2 diabetes mellitus with other  skin ulcer E11.42 Type 2 diabetes mellitus with diabetic polyneuropathy Modifier: 25 with fat layer lammation of ri Quantity: 1 exposed ght lower CPT4: [id number] Description: 11042 - WC PHYS SUBQ TISS 20 SQ CM ICD-10 Diagnosis Description L97.812 Non-pressure chronic ulcer of other part of right lower leg Modifier: with fat layer Quantity: 1 exposed Electronic Signature(s) Signed: 08/01/2019 6:02:32 PM By: Zandra Abts RN, BSN Signed: 08/02/2019 4:08:35 PM By: Dennis Najjar MD Entered By: Zandra Abts on 08/01/2019 16:39:00

## 2019-08-03 NOTE — Progress Notes (Signed)
Dennis Wyatt, Dennis Wyatt ([id number]) Visit Report for 08/01/2019 Allergy List Details Patient Name: Date of Service: Dennis Wyatt, Dennis Wyatt 08/01/2019 1:15 PM Medical Record Number:[id number] Patient Account Number: [id number] Date of Birth/Sex: Treating RN: 07/20/1960 (59 y.o. Male) Zenaida Deed Primary Care Hessie Varone: [name] Other Clinician: Referring Analeese Andreatta: Treating Samira Acero/Extender:[name], [name], [name]: 0 Allergies Active Allergies oxycodone Reaction: unknown Allergy Notes Electronic Signature(s) Signed: 08/01/2019 5:35:37 PM By: Zenaida Deed RN, BSN Entered By: Zenaida Deed on 08/01/2019 13:47:26 -------------------------------------------------------------------------------- Arrival Information Details Patient Name: Date of Service: Dennis Wyatt, Dennis Wyatt 08/01/2019 1:15 PM Medical Record [id number] Patient Account Number: [id number] Date of Birth/Sex: Treating RN: [birth year] (58 y.o. Male) Zenaida Deed Primary Care Annessa Satre: [name] Other Clinician: Referring Gavon Majano: Treating Tetsuo Coppola/Extender:[name], [name], [name]: 0 Visit Information Patient Arrived: Gilmer Mor Arrival Time: 13:41 Accompanied By: self Transfer Assistance: None Patient Identification Verified: Yes Secondary Verification Process Completed: Yes Patient Requires Transmission-Based No Precautions: Patient Has Alerts: No Electronic Signature(s) Signed: 08/01/2019 5:35:37 PM By: Zenaida Deed RN, BSN Entered By: Zenaida Deed on 08/01/2019 13:47:07 -------------------------------------------------------------------------------- Clinic Level of Care Assessment Details Patient Name: Date of Service: Dennis Wyatt, Dennis Wyatt 08/01/2019 1:15 PM Medical Record Number:[id number] Patient Account Number: [id number] Date of Birth/Sex: Treating RN: 1961/03/12 (58 y.o. Male) Zandra Abts Primary Care Shuayb Schepers: [name] Other Clinician: Referring Aristotle Lieb: Treating Riker Collier/Extender:[name], [name], [name]: 0 Clinic Level of Care Assessment Items TOOL 1 Quantity Score X - Use when EandM and Procedure is performed on INITIAL visit 1 0 ASSESSMENTS - Nursing Assessment / Reassessment X - General Physical Exam (combine w/ comprehensive assessment (listed just below) 1 20 when performed on new pt. evals) X - Comprehensive Assessment (HX, ROS, Risk Assessments, Wounds Hx, etc.) 1 25 ASSESSMENTS - Wound and Skin Assessment / Reassessment []  - Dermatologic / Skin Assessment (not related to wound area) 0 ASSESSMENTS - Ostomy and/or Continence Assessment and Care []  - Incontinence Assessment and Management 0 []  - Ostomy Care Assessment and Management (repouching, etc.) 0 PROCESS - Coordination of Care X - Simple Patient / Family Education for ongoing care 1 15 []  - Complex (extensive) Patient / Family Education for ongoing care 0 X - Staff obtains , Records, Test Results / Process Orders 1 10 []  - Staff telephones HHA, Nursing Homes / Clarify orders / etc 0 []  - Routine Transfer to another Facility (non-emergent condition) 0 []  - Routine Hospital Admission (non-emergent condition) 0 X - New Admissions / / Ordering NPWT, Apligraf, etc. 1 15 []  - Emergency Hospital Admission (emergent condition) 0 PROCESS - Special Needs []  - Pediatric / Minor Patient Management 0 []  - Isolation Patient Management 0 []  - Hearing / Language / Visual special needs 0 []  - Assessment of Community assistance (transportation, D/C planning, etc.) 0 []  - Additional assistance / Altered mentation 0 []  - Support Surface(s) Assessment (bed, cushion, seat, etc.) 0 INTERVENTIONS - Miscellaneous []  - External ear exam 0 []  - Patient Transfer (multiple staff / / Similar devices) 0 []  - Simple Staple / Suture removal (25 or less) 0 []  - Complex Staple /  Suture removal (26 or more) 0 []  - Hypo/Hyperglycemic Management (do not check if billed separately) 0 X - Ankle / Brachial Index (ABI) - do not check if billed separately 1 15 Has the patient been seen at the hospital within the last three years: Yes Total Score: 100 Level Of Care: New/Established - Level 3 Electronic Signature(s)  Signed: 08/01/2019 6:02:32 PM By: Zandra AbtsLynch, Shatara RN, BSN Entered By: Zandra AbtsLynch, Shatara on 08/01/2019 15:16:10 -------------------------------------------------------------------------------- Compression Therapy Details Patient Name: Date of Service: Dennis Wyatt, Dennis Wyatt 08/01/2019 1:15 PM Medical Record [id number] Patient Account Number: [id number] Date of Birth/Sex: 1961-03-12 (58 y.o. Male) Treating RN: Zandra AbtsLynch, Shatara Primary Care Ahmari Garton: [name], Wilson Oliver Other Clinician: Referring Jakaiden Fill: Treating Brennin Durfee/Extender:[name], Maeola SarahMichael Elkins, Curly RimWilson Oliver Weeks in Treatment: 0 Compression Therapy Performed for Wound Wound #1 Right,Anterior Lower Leg Assessment: Performed By: Clinician Zandra AbtsLynch, Shatara, RN Compression Type: Three Layer Post Procedure Diagnosis Same as Pre-procedure Electronic Signature(s) Signed: 08/01/2019 6:02:32 PM By: Zandra AbtsLynch, Shatara RN, BSN Entered By: Zandra AbtsLynch, Shatara on 08/01/2019 15:13:10 -------------------------------------------------------------------------------- Compression Therapy Details Patient Name: Date of Service: Dennis Wyatt, Dennis Wyatt 08/01/2019 1:15 PM Medical Record [id number] Patient Account Number: [id number] Date of Birth/Sex: 1961-03-12 (58 y.o. Male) Treating RN: Zandra AbtsLynch, Shatara Primary Care Nancyjo Givhan: [name], Wilson Oliver Other Clinician: Referring Eyad Rochford: Treating Varnika Butz/Extender:[name], Maeola SarahMichael Elkins, Curly RimWilson Oliver Weeks in Treatment: 0 Compression Therapy Performed for Wound Wound #2 Right,Lateral Lower Leg Assessment: Performed By: Clinician Zandra AbtsLynch, Shatara, RN Compression Type: Three Layer Post  Procedure Diagnosis Same as Pre-procedure Electronic Signature(s) Signed: 08/01/2019 6:02:32 PM By: Zandra AbtsLynch, Shatara RN, BSN Entered By: Zandra AbtsLynch, Shatara on 08/01/2019 15:13:10 -------------------------------------------------------------------------------- Compression Therapy Details Patient Name: Date of Service: Dennis Wyatt, Dennis Wyatt 08/01/2019 1:15 PM Medical Record [id number] Patient Account Number: [id number] Date of Birth/Sex: 1961-03-12 (58 y.o. Male) Treating RN: Zandra AbtsLynch, Shatara Primary Care Cheyrl Buley: [name], Wilson Oliver Other Clinician: Referring Glady Ouderkirk: Treating Thula Stewart/Extender:[name], Maeola SarahMichael Elkins, Curly RimWilson Oliver Weeks in Treatment: 0 Compression Therapy Performed for Wound Wound #3 Right,Posterior Lower Leg Assessment: Performed By: Clinician Zandra AbtsLynch, Shatara, RN Compression Type: Three Layer Post Procedure Diagnosis Same as Pre-procedure Electronic Signature(s) Signed: 08/01/2019 6:02:32 PM By: Zandra AbtsLynch, Shatara RN, BSN Entered By: Zandra AbtsLynch, Shatara on 08/01/2019 15:13:11 -------------------------------------------------------------------------------- Encounter Discharge Information Details Patient Name: Date of Service: Dennis Wyatt, Dennis Wyatt 08/01/2019 1:15 PM Medical Record [id number] Patient Account Number: [id number] Date of Birth/Sex: Treating RN: 1961-03-12 (58 y.o. Male) Cherylin Mylarwiggins, Shannon Primary Care Gene Glazebrook: [name], Wilson Oliver Other Clinician: Referring Mollyann Halbert: Treating Shenice Dolder/Extender:[name], Maeola SarahMichael Elkins, Curly RimWilson Oliver Weeks in Treatment: 0 Encounter Discharge Information Items Post Procedure Vitals Discharge Condition: Stable Temperature (F): 98.7 Ambulatory Status: Cane Pulse (bpm): 66 Discharge Destination: Home Respiratory Rate (breaths/min): 20 Transportation: Private Auto Blood Pressure (mmHg): 126/54 Accompanied By: self Schedule Follow-up Appointment: Yes Clinical Summary of Care: Patient Declined Electronic Signature(s) Signed:  08/01/2019 5:45:55 PM By: Cherylin Mylarwiggins, Shannon Entered By: Cherylin Mylarwiggins, Shannon on 08/01/2019 15:28:38 -------------------------------------------------------------------------------- Lower Extremity Assessment Details Patient Name: Date of Service: Dennis Wyatt, Dennis Wyatt 08/01/2019 1:15 PM Medical Record [id number] Patient Account Number: [id number] Date of Birth/Sex: Treating RN: 1961-03-12 (58 y.o. Male) Zenaida DeedBoehlein, Linda Primary Care Stevin Bielinski: [name], Wilson Oliver Other Clinician: Referring Artesia Berkey: Treating Lanaiya Lantry/Extender:[name], Maeola SarahMichael Elkins, Curly RimWilson Oliver Weeks in Treatment: 0 Edema Assessment Assessed: [Left: No] [Right: No] Edema: [Left: Ye] [Right: s] Calf Left: Right: Point of Measurement: 36 cm From Medial Instep cm 41 cm Ankle Left: Right: Point of Measurement: 9 cm From Medial Instep cm 23 cm Vascular Assessment Pulses: Dorsalis Pedis Palpable: [Right:No] Blood Pressure: Brachial: [Right:126] Dorsalis Pedis: 116 Ankle: [Right:Posterior Tibial: 120 0.95] Electronic Signature(s) Signed: 08/01/2019 5:35:37 PM By: Zenaida DeedBoehlein, Linda RN, BSN Entered By: Zenaida DeedBoehlein, Linda on 08/01/2019 14:18:38 -------------------------------------------------------------------------------- Multi Wound Chart Details Patient Name: Date of Service: Dennis Wyatt, Dennis Wyatt 08/01/2019 1:15 PM Medical Record [id number] Patient Account Number: [id number] Date of Birth/Sex: Treating RN: 1961-03-12 (58 y.o. Male) Primary Care Charice Zuno: [name], Wilson Oliver Other Clinician: Referring Legion Discher: Treating Jamariah Tony/Extender:[name], Maeola SarahMichael Elkins, Andrey CampanileWilson  Olin Pialiver Weeks in Treatment: 0 Vital Signs Height(in): Pulse(bpm): 66 Weight(lbs): Blood Pressure(mmHg): 126/54 Body Mass Index(BMI): Temperature(F): 98.7 Respiratory 20 Rate(breaths/min): Photos: [1:No Photos] [2:No Photos] [3:No Photos] Wound Location: [1:Right Lower Leg - Anterior Right Lower Leg - Lateral Right Lower Leg - Posterior] Wounding  Event: [1:Trauma] [2:Gradually Appeared] [3:Gradually Appeared] Primary Etiology: [1:Venous Leg Ulcer] [2:Venous Leg Ulcer] [3:Venous Leg Ulcer] Secondary Etiology: [1:Diabetic Wound/Ulcer of the Diabetic Wound/Ulcer of the Diabetic Wound/Ulcer of the Lower Extremity] [2:Lower Extremity] [3:Lower Extremity] Comorbid History: [1:Deep Vein Thrombosis, Hypertension, Type II Diabetes, Osteoarthritis] [2:Deep Vein Thrombosis, Hypertension, Type II Diabetes, Osteoarthritis] [3:Deep Vein Thrombosis, Hypertension, Type II Diabetes, Osteoarthritis] Date Acquired: [1:06/14/2019] [2:07/11/2019] [3:07/11/2019] Weeks of Treatment: [1:0] [2:0] [3:0] Wound Status: [1:Open] [2:Open] [3:Open] Measurements L x W x D 3.7x2.8x0.2 [2:2.8x1.5x0.3] [3:1.5x3x0.1] (cm) Area (cm) : [1:8.137] [2:3.299] [3:3.534] Volume (cm) : [1:1.627] [2:0.99] [3:0.353] Classification: [1:Full Thickness Without Exposed Support Structures Exposed Support Structures Exposed Support Structures] [2:Full Thickness Without] [3:Full Thickness Without] Exudate Amount: [1:Medium] [2:Medium] [3:Medium] Exudate Type: [1:Serous] [2:Serosanguineous] [3:Serosanguineous] Exudate Color: [1:amber] [2:red, brown] [3:red, brown] Wound Margin: [1:Flat and Intact] [2:Flat and Intact] [3:Flat and Intact] Granulation Amount: [1:Medium (34-66%)] [2:Small (1-33%)] [3:Medium (34-66%)] Granulation Quality: [1:Red] [2:Red] [3:Red] Necrotic Amount: [1:Medium (34-66%)] [2:Large (67-100%)] [3:Medium (34-66%)] Exposed Structures: [1:Fat Layer (Subcutaneous Tissue) Exposed: Yes Fascia: No Tendon: No Muscle: No Joint: No Bone: No] [2:Fat Layer (Subcutaneous Tissue) Exposed: Yes Fascia: No Tendon: No Muscle: No Joint: No Bone: No] [3:Fat Layer (Subcutaneous Tissue) Exposed: Yes  Fascia: No Tendon: No Muscle: No Joint: No Bone: No] Epithelialization: [1:None] [2:None] [3:None] Debridement: [1:Debridement - Excisional] [2:Debridement - Excisional] [3:N/A] Pre-procedure  [1:15:10] [2:15:10] [3:N/A] Verification/Time Out Taken: Pain Control: [1:Other] [2:Other] [3:N/A] Tissue Debrided: [1:Subcutaneous, Slough] [2:Subcutaneous, Slough] [3:N/A] Level: [1:Skin/Subcutaneous Tissue] [2:Skin/Subcutaneous Tissue] [3:N/A] Debridement Area (sq cm):10.36 [2:4.2] [3:N/A] Instrument: [1:Curette] [2:Curette] [3:N/A] Bleeding: [1:Moderate] [2:Moderate] [3:N/A] Hemostasis Achieved: [1:Pressure] [2:Pressure] [3:N/A] Procedural Pain: [1:0] [2:0] [3:N/A] Post Procedural Pain: [1:0] [2:0] [3:N/A] Debridement Treatment Procedure was tolerated [2:Procedure was tolerated] [3:N/A] Response: [1:well] [2:well] Post Debridement [1:3.7x2.8x0.2] [2:2.8x1.5x0.3] [3:N/A] Measurements L x W x D (cm) Post Debridement [1:1.627] [2:0.99] [3:N/A] Volume: (cm) Procedures Performed: Compression Therapy [1:Debridement] [2:Compression Therapy Debridement] [3:Compression Therapy] Treatment Notes Wound #1 (Right, Anterior Lower Leg) 1. Cleanse With Wound Cleanser 2. Periwound Care Moisturizing lotion TCA Cream 3. Primary Dressing Applied Iodoflex 4. Secondary Dressing ABD Pad Kerramax/Xtrasorb 6. Support Layer Applied 3 layer compression wrap Notes netting Wound #2 (Right, Lateral Lower Leg) 1. Cleanse With Wound Cleanser 2. Periwound Care Moisturizing lotion TCA Cream 3. Primary Dressing Applied Iodoflex 4. Secondary Dressing ABD Pad Kerramax/Xtrasorb 6. Support Layer Applied 3 layer compression wrap Notes netting Wound #3 (Right, Posterior Lower Leg) 1. Cleanse With Wound Cleanser 2. Periwound Care Moisturizing lotion TCA Cream 3. Primary Dressing Applied Iodoflex 4. Secondary Dressing ABD Pad Kerramax/Xtrasorb 6. Support Layer Applied 3 layer compression wrap Notes netting Electronic Signature(s) Signed: 08/02/2019 4:08:35 PM By: Baltazar Najjarobson, Michael MD Entered By: Baltazar Najjarobson, Michael on 08/01/2019  15:43:20 -------------------------------------------------------------------------------- Multi-Disciplinary Care Plan Details Patient Name: Date of Service: Dennis Wyatt, Tenzin 08/01/2019 1:15 PM Medical Record [id number] Patient Account Number: [id number] Date of Birth/Sex: Treating RN: [birth year] (58 y.o. Male) Zandra AbtsLynch, Shatara Primary Care Zenon Leaf: [name], Wilson Oliver Other Clinician: Referring Shirleyann Montero: Treating Dejanay Wamboldt/Extender:[name], Maeola SarahMichael Elkins, Curly RimWilson Oliver Weeks in Treatment: 0 Active Inactive Nutrition Nursing Diagnoses: Impaired glucose control: actual or potential Potential for alteratiion in Nutrition/Potential for imbalanced nutrition Goals: Patient/caregiver agrees to and verbalizes understanding of need to use nutritional supplements and/or  vitamins as prescribed Date Initiated: 08/01/2019 Target Resolution Date: 09/02/2019 Goal Status: Active Patient/caregiver will maintain therapeutic glucose control Date Initiated: 08/01/2019 Target Resolution Date: 09/02/2019 Goal Status: Active Interventions: Assess HgA1c results as ordered upon admission and as needed Assess patient nutrition upon admission and as needed per policy Provide education on elevated blood sugars and impact on wound healing Provide education on nutrition Notes: Venous Leg Ulcer Nursing Diagnoses: Knowledge deficit related to disease process and management Potential for venous Insuffiency (use before diagnosis confirmed) Goals: Patient will maintain optimal edema control Date Initiated: 08/01/2019 Target Resolution Date: 09/02/2019 Goal Status: Active Patient/caregiver will verbalize understanding of disease process and disease management Date Initiated: 08/01/2019 Target Resolution Date: 09/02/2019 Goal Status: Active Interventions: Assess peripheral edema status every visit. Compression as ordered Provide education on venous insufficiency Notes: Wound/Skin Impairment Nursing  Diagnoses: Impaired tissue integrity Knowledge deficit related to smoking impact on wound healing Knowledge deficit related to ulceration/compromised skin integrity Goals: Patient/caregiver will verbalize understanding of skin care regimen Date Initiated: 08/01/2019 Target Resolution Date: 09/02/2019 Goal Status: Active Interventions: Assess patient/caregiver ability to obtain necessary supplies Assess patient/caregiver ability to perform ulcer/skin care regimen upon admission and as needed Assess ulceration(s) every visit Provide education on ulcer and skin care Notes: Electronic Signature(s) Signed: 08/01/2019 6:02:32 PM By: Zandra Abts RN, BSN Entered By: Zandra Abts on 08/01/2019 15:09:48 -------------------------------------------------------------------------------- Pain Assessment Details Patient Name: Date of Service: DEANTRE, BOURDON 08/01/2019 1:15 PM Medical Record [id number] Patient Account Number: [id number] Date of Birth/Sex: Treating RN: 30-Jun-1961 (58 y.o. Male) Zenaida Deed Primary Care Avani Sensabaugh: [name] Other Clinician: Referring Shivank Pinedo: Treating Jakari Jacot/Extender:[name], [name], [name]: 0 Active Problems Location of Pain Severity and Description of Pain Patient Has Paino Yes Site Locations Pain Location: Pain in Ulcers With Dressing Change: Yes Rate the pain. Current Pain Level: 4 Worst Pain Level: 8 Least Pain Level: 2 Character of Pain Describe the Pain: Burning, Throbbing Pain Management and Medication Current Pain Management: Medication: Yes Other: reposition Rest: Yes Is the Current Pain Management Adequate: Adequate How does your wound impact your activities of daily livingo Sleep: Yes Bathing: No Appetite: No Relationship With Others: No Bladder Continence: No Emotions: Yes Bowel Continence: No Hobbies: Yes Toileting: No Dressing: No Electronic Signature(s) Signed:  08/01/2019 5:35:37 PM By: Zenaida Deed RN, BSN Entered By: Zenaida Deed on 08/01/2019 14:27:58 -------------------------------------------------------------------------------- Patient/Caregiver Education Details Patient Name: ALANO, BLASCO 1/18/2021andnbsp1:15 Date of Service: PM Medical Record [id number] Number: Patient Account Number: [id number] Treating RN: Date of Birth/Gender: 21-Oct-1960 (58 y.o. Zandra Abts Male) Other Clinician: Primary Care Physician: [name] Treating [name] Referring Physician: Physician/Extender: Golden Circle in Treatment: 0 Education Assessment Education Provided To: Patient Education Topics Provided Elevated Blood Sugar/ Impact on Healing: Handouts: Elevated Blood Sugars: How Do They Affect Wound Healing Methods: Explain/Verbal, Printed Responses: State content correctly Nutrition: Methods: Explain/Verbal, Printed Responses: State content correctly Venous: Handouts: Managing Venous Disease and Related Ulcers Methods: Explain/Verbal, Printed Responses: State content correctly Wound/Skin Impairment: Handouts: Smoking and Wound Healing Methods: Explain/Verbal, Printed Responses: State content correctly Electronic Signature(s) Signed: 08/01/2019 6:02:32 PM By: Zandra Abts RN, BSN Entered By: Zandra Abts on 08/01/2019 15:15:45 -------------------------------------------------------------------------------- Wound Assessment Details Patient Name: Date of Service: SANJUAN, SAWA 08/01/2019 1:15 PM Medical Record [id number] Patient Account Number: [id number] Date of Birth/Sex: Treating RN: August 08, 1960 (58 y.o. Male) Zenaida Deed Primary Care Aubrionna Istre: [name] Other Clinician: Referring Ailyne Pawley: Treating Rainbow Salman/Extender:[name], [name], [name]: 0 Wound  Status Wound Number: 1 Primary Venous Leg Ulcer Etiology: Wound Location: Right Lower  Leg - Anterior Secondary Diabetic Wound/Ulcer of the Lower Wounding Event: Trauma Etiology: Extremity Date Acquired: 06/14/2019 Wound Open Weeks Of Treatment: 0 Status: Clustered Wound: No Comorbid Deep Vein Thrombosis, Hypertension, Type History: II Diabetes, Osteoarthritis Photos Wound Measurements Length: (cm) 3.7 % Reduct Width: (cm) 2.8 % Reduct Depth: (cm) 0.2 Epitheli Area: (cm) 8.137 Tunneli Volume: (cm) 1.627 Undermi Wound Description Full Thickness Without Exposed Support Foul Odo Classification: Structures Slough/F Wound Flat and Intact Margin: Exudate Medium Amount: Exudate Serous Type: Exudate amber Color: Wound Bed Granulation Amount: Medium (34-66%) Granulation Quality: Red Fascia E Necrotic Amount: Medium (34-66%) Fat Laye Necrotic Quality: Adherent Slough Tendon E Muscle E Joint Ex Bone Exp r After Cleansing: No ibrino Yes Exposed Structure xposed: No r (Subcutaneous Tissue) Exposed: Yes xposed: No xposed: No posed: No osed: No ion in Area: 0% ion in Volume: 0% alization: None ng: No ning: No Treatment Notes Wound #1 (Right, Anterior Lower Leg) 1. Cleanse With Wound Cleanser 2. Periwound Care Moisturizing lotion TCA Cream 3. Primary Dressing Applied Iodoflex 4. Secondary Dressing ABD Pad Kerramax/Xtrasorb 6. Support Layer Applied 3 layer compression wrap Notes netting Electronic Signature(s) Signed: 08/02/2019 4:37:08 PM By: Benjaman Kindler EMT/HBOT Signed: 08/03/2019 6:03:18 PM By: Zenaida Deed RN, BSN Previous Signature: 08/01/2019 5:35:37 PM Version By: Zenaida Deed RN, BSN Entered By: Benjaman Kindler on 08/02/2019 15:07:32 -------------------------------------------------------------------------------- Wound Assessment Details Patient Name: Date of Service: NIVIN, BRANIFF 08/01/2019 1:15 PM Medical Record [id number] Patient Account Number: [id number] Date of Birth/Sex: Treating RN: 03/23/1961 (58 y.o. Male)  Zenaida Deed Primary Care Josue Kass: [name] Other Clinician: Referring Tamela Elsayed: Treating Kiron Osmun/Extender:[name], [name], [name]: 0 Wound Status Wound Number: 2 Primary Venous Leg Ulcer Etiology: Wound Location: Right Lower Leg - Lateral Secondary Diabetic Wound/Ulcer of the Lower Wounding Event: Gradually Appeared Etiology: Extremity Date Acquired: 07/11/2019 Wound Open Weeks Of Treatment: 0 Status: Clustered Wound: No Comorbid Deep Vein Thrombosis, Hypertension, Type History: II Diabetes, Osteoarthritis Photos Wound Measurements Length: (cm) 2.8 % Reductio Width: (cm) 1.5 % Reductio Depth: (cm) 0.3 Epithelial Area: (cm) 3.299 Tunneling Volume: (cm) 0.99 Undermini Wound Description Classification: Full Thickness Without Exposed Support Foul Odo Structures Slough/F Wound Flat and Intact Margin: Exudate Medium Amount: Exudate Serosanguineous Type: Exudate red, brown Color: Wound Bed Granulation Amount: Small (1-33%) Granulation Quality: Red Fascia E Necrotic Amount: Large (67-100%) Fat Laye Necrotic Quality: Adherent Slough Tendon E Muscle E Joint Ex Bone Exp r After Cleansing: No ibrino Yes Exposed Structure xposed: No r (Subcutaneous Tissue) Exposed: Yes xposed: No xposed: No posed: No osed: No n in Area: 0% n in Volume: 0% ization: None : No ng: No Treatment Notes Wound #2 (Right, Lateral Lower Leg) 1. Cleanse With Wound Cleanser 2. Periwound Care Moisturizing lotion TCA Cream 3. Primary Dressing Applied Iodoflex 4. Secondary Dressing ABD Pad Kerramax/Xtrasorb 6. Support Layer Applied 3 layer compression wrap Notes netting Electronic Signature(s) Signed: 08/02/2019 4:37:08 PM By: Benjaman Kindler EMT/HBOT Signed: 08/03/2019 6:03:18 PM By: Zenaida Deed RN, BSN Previous Signature: 08/01/2019 5:35:37 PM Version By: Zenaida Deed RN, BSN Entered By: Benjaman Kindler on 08/02/2019  15:07:12 -------------------------------------------------------------------------------- Wound Assessment Details Patient Name: Date of Service: ANGIE, HOGG 08/01/2019 1:15 PM Medical Record [id number] Patient Account Number: [id number] Date of Birth/Sex: Treating RN: 1961/01/30 (58 y.o. Male) Zenaida Deed Primary Care Abigail Marsiglia: [name] Other Clinician: Referring Beyonca Wisz: Treating Rickell Wiehe/Extender:[name], [name], [name]: 0  Wound Status Wound Number: 3 Primary Venous Leg Ulcer Etiology: Wound Location: Right Lower Leg - Posterior Secondary Diabetic Wound/Ulcer of the Lower Wounding Event: Gradually Appeared Etiology: Extremity Date Acquired: 07/11/2019 Wound Open Weeks Of Treatment: 0 Status: Clustered Wound: No Comorbid Deep Vein Thrombosis, Hypertension, Type History: II Diabetes, Osteoarthritis Photos Wound Measurements Length: (cm) 1.5 % Reduct Width: (cm) 3 % Reduct Depth: (cm) 0.1 Epitheli Area: (cm) 3.534 Tunneli Volume: (cm) 0.353 Undermi Wound Description Classification: Full Thickness Without Exposed Support Foul Odo Structures Slough/F Wound Flat and Intact Margin: Exudate Medium Amount: Exudate Serosanguineous Type: Exudate red, brown Color: Wound Bed Granulation Amount: Medium (34-66%) Granulation Quality: Red Fascia E Necrotic Amount: Medium (34-66%) Fat Laye Necrotic Quality: Adherent Slough Tendon E Muscle E Joint Ex Bone Exp r After Cleansing: No ibrino No Exposed Structure xposed: No r (Subcutaneous Tissue) Exposed: Yes xposed: No xposed: No posed: No osed: No ion in Area: 0% ion in Volume: 0% alization: None ng: No ning: No Treatment Notes Wound #3 (Right, Posterior Lower Leg) 1. Cleanse With Wound Cleanser 2. Periwound Care Moisturizing lotion TCA Cream 3. Primary Dressing Applied Iodoflex 4. Secondary Dressing ABD Pad Kerramax/Xtrasorb 6. Support Layer  Applied 3 layer compression wrap Notes netting Electronic Signature(s) Signed: 08/02/2019 4:37:08 PM By: Mikeal Hawthorne EMT/HBOT Signed: 08/03/2019 6:03:18 PM By: Baruch Gouty RN, BSN Previous Signature: 08/01/2019 5:35:37 PM Version By: Baruch Gouty RN, BSN Entered By: Mikeal Hawthorne on 08/02/2019 15:08:00 -------------------------------------------------------------------------------- Greer Details Patient Name: Date of Service: RAVI, TUCCILLO 08/01/2019 1:15 PM Medical Record [id number] Patient Account Number: [id number] Date of Birth/Sex: Treating RN: 1960-11-10 (59 y.o. Male) [name] Primary Care Akia Desroches: [name] Other Clinician: Referring Jaydrian Corpening: Treating Vandy Fong/Extender:[name], [name], [name] Weeks in Treatment: 0 Vital Signs Time Taken: 13:42 Temperature (F): 98.7 Pulse (bpm): 66 Respiratory Rate (breaths/min): 20 Blood Pressure (mmHg): 126/54 Reference Range: 80 - 120 mg / dl Electronic Signature(s) Signed: 08/01/2019 6:02:32 PM By: [name] RN, BSN Entered By: [name] on 08/01/2019 15:07:43

## 2019-08-05 DIAGNOSIS — E119 Type 2 diabetes mellitus without complications: Secondary | ICD-10-CM | POA: Diagnosis not present

## 2019-08-05 DIAGNOSIS — J449 Chronic obstructive pulmonary disease, unspecified: Secondary | ICD-10-CM | POA: Diagnosis not present

## 2019-08-05 DIAGNOSIS — F17210 Nicotine dependence, cigarettes, uncomplicated: Secondary | ICD-10-CM | POA: Diagnosis not present

## 2019-08-08 ENCOUNTER — Other Ambulatory Visit: Payer: Self-pay

## 2019-08-08 ENCOUNTER — Encounter (HOSPITAL_BASED_OUTPATIENT_CLINIC_OR_DEPARTMENT_OTHER): Payer: PPO | Attending: Internal Medicine | Admitting: Internal Medicine

## 2019-08-08 DIAGNOSIS — I87331 Chronic venous hypertension (idiopathic) with ulcer and inflammation of right lower extremity: Secondary | ICD-10-CM | POA: Diagnosis not present

## 2019-08-08 DIAGNOSIS — L97812 Non-pressure chronic ulcer of other part of right lower leg with fat layer exposed: Secondary | ICD-10-CM | POA: Diagnosis not present

## 2019-08-08 DIAGNOSIS — I87311 Chronic venous hypertension (idiopathic) with ulcer of right lower extremity: Secondary | ICD-10-CM | POA: Diagnosis not present

## 2019-08-08 DIAGNOSIS — L97212 Non-pressure chronic ulcer of right calf with fat layer exposed: Secondary | ICD-10-CM | POA: Diagnosis not present

## 2019-08-09 NOTE — Progress Notes (Signed)
DEPAUL, ARIZPE ([id number]) Visit Report for 08/08/2019 HPI Details Patient Name: Date of Service: Dennis Wyatt, Dennis Wyatt 08/08/2019 12:45 PM Medical Record Number:[id number] Patient Account Number: [id number] Date of Birth/Sex: Treating RN: 04-30-1961 (59 y.o. M) Primary Care Provider: [name] Other Clinician: Referring Provider: Treating Provider/Extender:Arelis Neumeier, [name], [name]: 1 History of Present Illness HPI Description: ADMISSION 08/01/2019 This is a 59 year old man who is essentially self-referred although he gets his primary care at [facility] healthcare. We did not have any information from them. He is here for review of 3 wounds on the right lower leg. He states about a month and a half ago he dropped his cell phone on the right anterior leg creating a wound. More recently he has had 2 blisters come out 1 medially just above the lateral malleolus and one on the posterior calf. All of these are roughly the same condition however. He has been applying some form of ointment were not really sure what this is. He does not have a wound history. 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 ABI in our clinic was 0.95 1/25; the patient has 3 wounds on the right leg. One is on the right anterior 1 just above the lateral malleolus and one on the posterior calf. We have been using Iodoflex under compression. The base of these wounds appears better Electronic Signature(s) Signed: 08/09/2019 5:32:11 PM By: [name] MD Entered By: [name] on 08/08/2019 14:18:05 -------------------------------------------------------------------------------- Physical Exam Details Patient Name: Date of Service: Dennis Wyatt, Dennis Wyatt 08/08/2019 12:45 PM Medical Record  [id number] Patient Account Number: [id number] Date of Birth/Sex: Treating RN: 09/24/1960 (59 y.o. M) Primary Care Provider: [name] Other Clinician: Referring Provider: Treating Provider/Extender:Derrall Hicks, [name], [name]: 1 Notes Wound exam; Major wound is on the lower and tibia however it also wounds just above the lateral malleolus and posteriorly all of these in the same condition still debris on the surfaces under illumination although I did not debride these today. The patient has changes of chronic venous insufficiency I do not believe there are any arterial issues. Peripheral pulses are easily felt Electronic Signature(s) Signed: 08/09/2019 5:32:11 PM By: [name] MD Entered By: [name] on 08/08/2019 14:18:48 -------------------------------------------------------------------------------- Physician Orders Details Patient Name: Date of Service: Dennis Wyatt, Dennis Wyatt 08/08/2019 12:45 PM Medical Record [id number] Patient Account Number: [id number] Date of Birth/Sex: Treating RN: 02/09/1961 (59 y.o. [name] Primary Care Provider: [name] Other Clinician: Referring Provider: Treating Provider/Extender:Kaio Kuhlman, [name], [name]: 1 Verbal / Phone Orders: No Diagnosis Coding ICD-10 Coding Code Description [phone] Non-pressure chronic ulcer of other part of right lower leg with fat layer exposed I87.331 Chronic venous hypertension (idiopathic) with ulcer and inflammation of right lower extremity E11.622 Type 2 diabetes mellitus with other skin ulcer E11.42 Type 2 diabetes mellitus with diabetic polyneuropathy Follow-up Appointments Return Appointment in 1 week. Dressing Change Frequency Do not change entire dressing for one week. Skin Barriers/Peri-Wound Care Moisturizing lotion TCA Cream or Ointment - mixed with lotion Wound Cleansing May shower with  protection. - may use cast protector Primary Wound Dressing Wound #1 Right,Anterior Lower Leg Iodoflex Wound #2 Right,Lateral Lower Leg Iodoflex Wound #3 Right,Posterior Lower Leg Iodoflex Secondary Dressing Wound #1 Right,Anterior Lower Leg ABD pad Zetuvit or Kerramax Wound #2 Right,Lateral Lower Leg ABD pad Zetuvit or Kerramax  Wound #3 Right,Posterior Lower Leg ABD pad Zetuvit or Kerramax Edema Control 3 Layer Compression System - Right Lower Extremity Avoid standing for long periods of time Elevate legs to the level of the heart or above for 30 minutes daily and/or when sitting, a frequency of: - throughout the day Exercise regularly Services and Therapies Venous Studies - Unilateral, Right leg with reflux - Non healing ulcers on right lower leg - (ICD10 L97.812 - Non-pressure chronic ulcer of other part of right lower leg with fat layer exposed) Electronic Signature(s) Signed: 08/08/2019 6:46:58 PM By: Zandra Abts RN, BSN Signed: 08/09/2019 5:32:11 PM By: [name] MD Entered By: Zandra Abts on 08/08/2019 14:08:18 -------------------------------------------------------------------------------- Problem List Details Patient Name: Date of Service: Dennis Wyatt, Dennis Wyatt 08/08/2019 12:45 PM Medical Record [id number] Patient Account Number: [id number] Date of Birth/Sex: Treating RN: 08-30-1960 (59 y.o. [name] Primary Care Provider: [name] Other Clinician: Referring Provider: Treating Provider/Extender:Shala Baumbach, [name], [name]: 1 Active Problems ICD-10 Evaluated Encounter Code Description Active Date Today Diagnosis [phone] Non-pressure chronic ulcer of other part of right lower 08/01/2019 No Yes leg with fat layer exposed I87.331 Chronic venous hypertension (idiopathic) with ulcer 08/01/2019 No Yes and inflammation of right lower extremity E11.622 Type 2 diabetes mellitus with other skin ulcer 08/01/2019  No Yes E11.42 Type 2 diabetes mellitus with diabetic polyneuropathy 08/01/2019 No Yes Inactive Problems Resolved Problems Electronic Signature(s) Signed: 08/09/2019 5:32:11 PM By: [name] MD Entered By: [name] on 08/08/2019 14:17:20 -------------------------------------------------------------------------------- Progress Note Details Patient Name: Date of Service: Dennis Wyatt, Dennis Wyatt 08/08/2019 12:45 PM Medical Record [id number] Patient Account Number: [id number] Date of Birth/Sex: Treating RN: 03-Jan-1961 (58 y.o. M) Primary Care Provider: [name] Other Clinician: Referring Provider: Treating Provider/Extender:Clark Cuff, [name], [name]: 1 Subjective History of Present Illness (HPI) ADMISSION 08/01/2019 This is a 59 year old man who is essentially self-referred although he gets his primary care at [facility] healthcare. We did not have any information from them. He is here for review of 3 wounds on the right lower leg. He states about a month and a half ago he dropped his cell phone on the right anterior leg creating a wound. More recently he has had 2 blisters come out 1 medially just above the lateral malleolus and one on the posterior calf. All of these are roughly the same condition however. He has been applying some form of ointment were not really sure what this is. He does not have a wound history. 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 ABI in our clinic was 0.95 1/25; the patient has 3 wounds on the right leg. One is on the right anterior 1 just above the lateral malleolus and one on the posterior calf. We have been using Iodoflex under compression. The base of these wounds appears better Objective Constitutional Vitals  Time Taken: 1:13 PM, Height: 70 in, Source: Stated, Weight: 302 lbs, Source: Stated, BMI: 43.3, Temperature: 98.8 F, Pulse: 74 bpm, Respiratory Rate: 18 breaths/min, Blood Pressure: 145/72 mmHg. Integumentary (Hair, Skin) Wound #1 status is Open. Original cause of wound was Trauma. The wound is located on the Right,Anterior Lower Leg. The wound measures 3.6cm length x 3cm width x 0.2cm depth; 8.482cm^2 area and 1.696cm^3 volume. There is Fat Layer (Subcutaneous Tissue) Exposed exposed. There is no tunneling or undermining noted. There is a  medium amount of serosanguineous drainage noted. The wound margin is flat and intact. There is medium (34-66%) red granulation within the wound bed. There is a medium (34-66%) amount of necrotic tissue within the wound bed including Adherent Slough. Wound #2 status is Open. Original cause of wound was Gradually Appeared. The wound is located on the Right,Lateral Lower Leg. The wound measures 2.5cm length x 2.1cm width x 0.4cm depth; 4.123cm^2 area and 1.649cm^3 volume. There is Fat Layer (Subcutaneous Tissue) Exposed exposed. There is no tunneling or undermining noted. There is a medium amount of serosanguineous drainage noted. The wound margin is flat and intact. There is medium (34-66%) red granulation within the wound bed. There is a medium (34-66%) amount of necrotic tissue within the wound bed including Adherent Slough. Wound #3 status is Open. Original cause of wound was Gradually Appeared. The wound is located on the Right,Posterior Lower Leg. The wound measures 1.7cm length x 1.9cm width x 0.1cm depth; 2.537cm^2 area and 0.254cm^3 volume. There is Fat Layer (Subcutaneous Tissue) Exposed exposed. There is no tunneling or undermining noted. There is a medium amount of serosanguineous drainage noted. The wound margin is flat and intact. There is large (67-100%) red granulation within the wound bed. There is a small (1-33%) amount of necrotic tissue  within the wound bed including Adherent Slough. Assessment Active Problems ICD-10 Non-pressure chronic ulcer of other part of right lower leg with fat layer exposed Chronic venous hypertension (idiopathic) with ulcer and inflammation of right lower extremity Type 2 diabetes mellitus with other skin ulcer Type 2 diabetes mellitus with diabetic polyneuropathy Procedures Wound #1 Pre-procedure diagnosis of Wound #1 is a Venous Leg Ulcer located on the Right,Anterior Lower Leg . There was a Three Layer Compression Therapy Procedure by [name], RN. Post procedure Diagnosis Wound #1: Same as Pre-Procedure Wound #2 Pre-procedure diagnosis of Wound #2 is a Venous Leg Ulcer located on the Right,Lateral Lower Leg . There was a Three Layer Compression Therapy Procedure by [name], RN. Post procedure Diagnosis Wound #2: Same as Pre-Procedure Wound #3 Pre-procedure diagnosis of Wound #3 is a Venous Leg Ulcer located on the Right,Posterior Lower Leg . There was a Three Layer Compression Therapy Procedure by [name], RN. Post procedure Diagnosis Wound #3: Same as Pre-Procedure Plan Follow-up Appointments: Return Appointment in 1 week. Dressing Change Frequency: Do not change entire dressing for one week. Skin Barriers/Peri-Wound Care: Moisturizing lotion TCA Cream or Ointment - mixed with lotion Wound Cleansing: May shower with protection. - may use cast protector Primary Wound Dressing: Wound #1 Right,Anterior Lower Leg: Iodoflex Wound #2 Right,Lateral Lower Leg: Iodoflex Wound #3 Right,Posterior Lower Leg: Iodoflex Secondary Dressing: Wound #1 Right,Anterior Lower Leg: ABD pad Zetuvit or Kerramax Wound #2 Right,Lateral Lower Leg: ABD pad Zetuvit or Kerramax Wound #3 Right,Posterior Lower Leg: ABD pad Zetuvit or Kerramax Edema Control: 3 Layer Compression System - Right Lower Extremity Avoid standing for long periods of time Elevate legs to the level of the  heart or above for 30 minutes daily and/or when sitting, a frequency of: - throughout the day Exercise regularly Services and Therapies ordered were: Venous Studies - Unilateral, Right leg with reflux - Non healing ulcers on right lower leg 1. I am continue with Iodoflex ABDs under compression 2. I ordered venous reflux studies on this man 3. He has [facility] insurance he is probably not going to be eligible for an advanced treatment product. 4. I do not believe there is an arterial issue here. Electronic  Signature(s) Signed: 08/09/2019 5:32:11 PM By: [name] MD Entered By: [name] on 08/08/2019 14:20:01 -------------------------------------------------------------------------------- SuperBill Details Patient Name: Date of Service: Dennis Wyatt, Dennis Wyatt 08/08/2019 Medical Record Number:[id number] Patient Account Number: [id number] Date of Birth/Sex: Treating RN: 1960/12/22 (58 y.o. [name] Primary Care Provider: [name] Other Clinician: Referring Provider: Treating Provider/Extender:Delaina Fetsch, [name], [name]: 1 Diagnosis Coding ICD-10 Codes Code Description [phone] Non-pressure chronic ulcer of other part of right lower leg with fat layer exposed I87.331 Chronic venous hypertension (idiopathic) with ulcer and inflammation of right lower extremity E11.622 Type 2 diabetes mellitus with other skin ulcer E11.42 Type 2 diabetes mellitus with diabetic polyneuropathy Facility Procedures CPT4 Code Description: [id number] (Facility Use Only) [phone] - APPLY MULTLAY COMPRS LWR RT LEG Modifier: Quantity: 1 Physician Procedures CPT4 Code Description: [id number] - WC PHYS LEVEL 3 - EST PT ICD-10 Diagnosis Description L97.812 Non-pressure chronic ulcer of other part of right lower le I87.331 Chronic venous hypertension (idiopathic) with ulcer and in lower extremity E11.622  Type 2 diabetes mellitus with other skin ulcer Modifier: g with  fat layer flammation of ri Quantity: 1 exposed ght Electronic Signature(s) Signed: 08/09/2019 5:32:11 PM By: [name] MD Entered By: [name] on 08/08/2019 14:20:37

## 2019-08-10 ENCOUNTER — Other Ambulatory Visit (HOSPITAL_COMMUNITY): Payer: Self-pay | Admitting: Internal Medicine

## 2019-08-10 ENCOUNTER — Telehealth (HOSPITAL_COMMUNITY): Payer: Self-pay

## 2019-08-10 DIAGNOSIS — L97919 Non-pressure chronic ulcer of unspecified part of right lower leg with unspecified severity: Secondary | ICD-10-CM

## 2019-08-10 NOTE — Telephone Encounter (Signed)

## 2019-08-11 ENCOUNTER — Encounter (HOSPITAL_BASED_OUTPATIENT_CLINIC_OR_DEPARTMENT_OTHER): Payer: PPO | Attending: Internal Medicine | Admitting: Internal Medicine

## 2019-08-11 ENCOUNTER — Other Ambulatory Visit: Payer: Self-pay

## 2019-08-11 ENCOUNTER — Ambulatory Visit (HOSPITAL_COMMUNITY)
Admission: RE | Admit: 2019-08-11 | Discharge: 2019-08-11 | Disposition: A | Discharge status: Home / Self Care | Payer: PPO | Source: Ambulatory Visit | Attending: Surgery | Admitting: Surgery

## 2019-08-11 DIAGNOSIS — I87331 Chronic venous hypertension (idiopathic) with ulcer and inflammation of right lower extremity: Secondary | ICD-10-CM | POA: Diagnosis not present

## 2019-08-11 DIAGNOSIS — L97812 Non-pressure chronic ulcer of other part of right lower leg with fat layer exposed: Secondary | ICD-10-CM | POA: Insufficient documentation

## 2019-08-11 DIAGNOSIS — E11622 Type 2 diabetes mellitus with other skin ulcer: Secondary | ICD-10-CM | POA: Insufficient documentation

## 2019-08-11 DIAGNOSIS — L97919 Non-pressure chronic ulcer of unspecified part of right lower leg with unspecified severity: Secondary | ICD-10-CM | POA: Diagnosis not present

## 2019-08-11 DIAGNOSIS — E1142 Type 2 diabetes mellitus with diabetic polyneuropathy: Secondary | ICD-10-CM | POA: Diagnosis not present

## 2019-08-11 NOTE — Progress Notes (Signed)
Dennis Wyatt, Dennis Wyatt ([id number]) Visit Report for 08/08/2019 Arrival Information Details Patient Name: Date of Service: Dennis Wyatt, Dennis Wyatt 08/08/2019 12:45 PM Medical Record [id number] Patient Account Number: [id number] Date of Birth/Sex: Treating RN: [birth year] (59 y.o. [name] Primary Care Nataly Pacifico: [name] Other Clinician: Referring Dmitri Pettigrew: Treating Loui Massenburg/Extender:[name], [name], [name] Weeks in Treatment: 1 Visit Information History Since Last Visit Cane Added or deleted any medications: No Patient Arrived: 13:13 Any new allergies or adverse reactions: No Arrival Time: Had a fall or experienced change in No Accompanied By: self None activities of daily living that may affect Transfer Assistance: risk of falls: Patient Identification Verified: Yes Signs or symptoms of abuse/neglect since last No Secondary Verification Process Completed: Yes visito Patient Requires Transmission-Based No Hospitalized since last visit: No Precautions: Implantable device outside of the clinic excluding No Patient Has Alerts: No cellular tissue based products placed in the center since last visit: Has Dressing in Place as Prescribed: Yes Has Compression in Place as Prescribed: Yes Pain Present Now: No Electronic Signature(s) Signed: 08/11/2019 7:44:20 AM By: [name] Entered By: [name] on 08/08/2019 13:13:47 -------------------------------------------------------------------------------- Compression Therapy Details Patient Name: Date of Service: Dennis Wyatt, Dennis Wyatt 08/08/2019 12:45 PM Medical Record [id number] Patient Account Number: [id number] Date of Birth/Sex: Treating RN: 1961-02-03 (59 y.o. [name] Primary Care Doninique Lwin: [name] Other Clinician: Referring Semaj Coburn: Treating Sayre Mazor/Extender:[name], [name], [name] Weeks in Treatment: 1 Compression Therapy Performed for Wound Wound  #1 Right,Anterior Lower Leg Assessment: Performed By: Clinician [name], RN Compression Type: Three Layer Post Procedure Diagnosis Same as Pre-procedure Electronic Signature(s) Signed: 08/08/2019 6:46:58 PM By: [name] RN, BSN Entered By: [name] on 08/08/2019 14:06:48 -------------------------------------------------------------------------------- Compression Therapy Details Patient Name: Date of Service: Dennis Wyatt, Dennis Wyatt 08/08/2019 12:45 PM Medical Record [id number] Patient Account Number: [id number] Date of Birth/Sex: Treating RN: 03/21/1961 (59 y.o. [name] Primary Care Cheryl Chay: [name] Other Clinician: Referring Shizuko Wojdyla: Treating Nevaeh Casillas/Extender:[name], [name], [name] Weeks in Treatment: 1 Compression Therapy Performed for Wound Wound #2 Right,Lateral Lower Leg Assessment: Performed By: Clinician [name], RN Compression Type: Three Layer Post Procedure Diagnosis Same as Pre-procedure Electronic Signature(s) Signed: 08/08/2019 6:46:58 PM By: [name] RN, BSN Entered By: [name] on 08/08/2019 14:06:48 -------------------------------------------------------------------------------- Compression Therapy Details Patient Name: Date of Service: Dennis Wyatt, Dennis Wyatt 08/08/2019 12:45 PM Medical Record [id number] Patient Account Number: [id number] Date of Birth/Sex: Treating RN: 09/13/1960 (59 y.o. [name] Primary Care Alyla Pietila: [name] Other Clinician: Referring Garan Frappier: Treating Kiasia Chou/Extender:[name], [name], [name] Weeks in Treatment: 1 Compression Therapy Performed for Wound Wound #3 Right,Posterior Lower Leg Assessment: Performed By: Clinician [name], RN Compression Type: Three Layer Post Procedure Diagnosis Same as Pre-procedure Electronic Signature(s) Signed: 08/08/2019 6:46:58 PM By: [name] RN, BSN Entered By: [name]  on 08/08/2019 14:06:48 -------------------------------------------------------------------------------- Encounter Discharge Information Details Patient Name: Date of Service: Dennis Wyatt, Dennis Wyatt 08/08/2019 12:45 PM Medical Record [id number] Patient Account Number: [id number] Date of Birth/Sex: Treating RN: 03-Jul-1961 (59 y.o. [name] Primary Care Maree Ainley: [name] Other Clinician: Referring Jerusalen Mateja: Treating Glinda Natzke/Extender:[name], [name], [name] Weeks in Treatment: 1 Encounter Discharge Information Items Discharge Condition: Stable Ambulatory Status: Cane Discharge Destination: Home Transportation: Private Auto Accompanied By: self Schedule Follow-up Appointment: Yes Clinical Summary of Care: Electronic Signature(s) Signed: 08/08/2019 5:49:19 PM By: [name] Entered By: [name] on 08/08/2019 14:52:12 -------------------------------------------------------------------------------- Lower Extremity Assessment Details Patient Name: Date of Service: Dennis Wyatt, Dennis Wyatt 08/08/2019 12:45 PM Medical Record [id number] Patient  Account Number: [id number] Date of Birth/Sex: Treating RN: 1961/06/26 (59 y.o. Katherina Right Primary Care Donevan Biller: [name] Other Clinician: Referring Zyionna Pesce: Treating Felica Chargois/Extender:[name], [name], [name]: 1 Edema Assessment Assessed: [Left: No] [Right: No] Edema: [Left: Ye] [Right: s] Calf Left: Right: Point of Measurement: 36 cm From Medial Instep cm 40 cm Ankle Left: Right: Point of Measurement: 9 cm From Medial Instep cm 25.5 cm Vascular Assessment Pulses: Dorsalis Pedis Palpable: [Right:Yes] Electronic Signature(s) Signed: 08/11/2019 7:44:20 AM By: Cherylin Mylar Entered By: Cherylin Mylar on 08/08/2019 13:20:12 -------------------------------------------------------------------------------- Multi Wound Chart Details Patient  Name: Date of Service: Dennis Wyatt, Dennis Wyatt 08/08/2019 12:45 PM Medical Record [id number] Patient Account Number: [id number] Date of Birth/Sex: Treating RN: Oct 08, 1960 (58 y.o. M) Primary Care Jackson Coffield: [name] Other Clinician: Referring Aniyiah Zell: Treating Antron Seth/Extender:[name], [name], [name]: 1 Vital Signs Height(in): 70 Pulse(bpm): 74 Weight(lbs): 302 Blood Pressure(mmHg): 145/72 Body Mass Index(BMI): 43 Temperature(F): 98.8 Respiratory 18 Rate(breaths/min): Photos: [1:No Photos] [2:No Photos] [3:No Photos] Wound Location: [1:Right Lower Leg - Anterior Right Lower Leg - Lateral Right Lower Leg - Posterior] Wounding Event: [1:Trauma] [2:Gradually Appeared] [3:Gradually Appeared] Primary Etiology: [1:Venous Leg Ulcer] [2:Venous Leg Ulcer] [3:Venous Leg Ulcer] Secondary Etiology: [1:Diabetic Wound/Ulcer of the Diabetic Wound/Ulcer of the Diabetic Wound/Ulcer of the Lower Extremity] [2:Lower Extremity] [3:Lower Extremity] Comorbid History: [1:Deep Vein Thrombosis, Hypertension, Type II Diabetes, Osteoarthritis] [2:Deep Vein Thrombosis, Hypertension, Type II Diabetes, Osteoarthritis] [3:Deep Vein Thrombosis, Hypertension, Type II Diabetes, Osteoarthritis] Date Acquired: [1:06/14/2019] [2:07/11/2019] [3:07/11/2019] Weeks of Treatment: [1:1] [2:1] [3:1] Wound Status: [1:Open] [2:Open] [3:Open] Measurements L x W x D 3.6x3x0.2 [2:2.5x2.1x0.4] [3:1.7x1.9x0.1] (cm) Area (cm) : [1:8.482] [2:4.123] [3:2.537] Volume (cm) : [1:1.696] [2:1.649] [3:0.254] % Reduction in Area: [1:-4.20%] [2:-25.00%] [3:28.20%] % Reduction in Volume: -4.20% [2:-66.60%] [3:28.00%] Classification: [1:Full Thickness Without Exposed Support Structures Exposed Support Structures Exposed Support Structures] [2:Full Thickness Without] [3:Full Thickness Without] Exudate Amount: [1:Medium] [2:Medium] [3:Medium] Exudate Type: [1:Serosanguineous] [2:Serosanguineous]  [3:Serosanguineous] Exudate Color: [1:red, brown] [2:red, brown] [3:red, brown] Wound Margin: [1:Flat and Intact] [2:Flat and Intact] [3:Flat and Intact] Granulation Amount: [1:Medium (34-66%)] [2:Medium (34-66%)] [3:Large (67-100%)] Granulation Quality: [1:Red] [2:Red] [3:Red] Necrotic Amount: [1:Medium (34-66%)] [2:Medium (34-66%)] [3:Small (1-33%)] Exposed Structures: [1:Fat Layer (Subcutaneous Tissue) Exposed: Yes Fascia: No Tendon: No Muscle: No Joint: No Bone: No] [2:Fat Layer (Subcutaneous Tissue) Exposed: Yes Fascia: No Tendon: No Muscle: No Joint: No Bone: No] [3:Fat Layer (Subcutaneous Tissue) Exposed: Yes  Fascia: No Tendon: No Muscle: No Joint: No Bone: No] Epithelialization: [1:None Compression Therapy] [2:None Compression Therapy] [3:None Compression Therapy] Treatment Notes Electronic Signature(s) Signed: 08/09/2019 5:32:11 PM By: [name] MD Entered By: [name] on 08/08/2019 14:17:30 -------------------------------------------------------------------------------- Multi-Disciplinary Care Plan Details Patient Name: Date of Service: Dennis Wyatt, Dennis Wyatt 08/08/2019 12:45 PM Medical Record [id number] Patient Account Number: [id number] Date of Birth/Sex: Treating RN: 1960/08/03 (58 y.o. [name] Primary Care Deandrew Hoecker: [name] Other Clinician: Referring Melody Savidge: Treating Marquavious Nazar/Extender:[name], [name], [name]: 1 Active Inactive Nutrition Nursing Diagnoses: Impaired glucose control: actual or potential Potential for alteratiion in Nutrition/Potential for imbalanced nutrition Goals: Patient/caregiver agrees to and verbalizes understanding of need to use nutritional supplements and/or vitamins as prescribed Date Initiated: 08/01/2019 Target Resolution Date: 09/02/2019 Goal Status: Active Patient/caregiver will maintain therapeutic glucose control Date Initiated: 08/01/2019 Target Resolution Date:  09/02/2019 Goal Status: Active Interventions: Assess HgA1c results as ordered upon admission and as needed Assess patient nutrition upon admission and as needed per policy Provide education on elevated  blood sugars and impact on wound healing Provide education on nutrition Treatment Activities: Education provided on Nutrition : 08/01/2019 Notes: Venous Leg Ulcer Nursing Diagnoses: Knowledge deficit related to disease process and management Potential for venous Insuffiency (use before diagnosis confirmed) Goals: Patient will maintain optimal edema control Date Initiated: 08/01/2019 Target Resolution Date: 09/02/2019 Goal Status: Active Patient/caregiver will verbalize understanding of disease process and disease management Date Initiated: 08/01/2019 Target Resolution Date: 09/02/2019 Goal Status: Active Interventions: Assess peripheral edema status every visit. Compression as ordered Provide education on venous insufficiency Notes: Wound/Skin Impairment Nursing Diagnoses: Impaired tissue integrity Knowledge deficit related to smoking impact on wound healing Knowledge deficit related to ulceration/compromised skin integrity Goals: Patient/caregiver will verbalize understanding of skin care regimen Date Initiated: 08/01/2019 Target Resolution Date: 09/02/2019 Goal Status: Active Interventions: Assess patient/caregiver ability to obtain necessary supplies Assess patient/caregiver ability to perform ulcer/skin care regimen upon admission and as needed Assess ulceration(s) every visit Provide education on ulcer and skin care Notes: Electronic Signature(s) Signed: 08/08/2019 6:46:58 PM By: Zandra Abts RN, BSN Entered By: Zandra Abts on 08/08/2019 14:05:48 -------------------------------------------------------------------------------- Pain Assessment Details Patient Name: Date of Service: Dennis Wyatt, Dennis Wyatt 08/08/2019 12:45 PM Medical Record [id number] Patient Account  Number: [id number] Date of Birth/Sex: Treating RN: 01-08-1961 (58 y.o. Katherina Right Primary Care Coretta Leisey: [name] Other Clinician: Referring Almadelia Looman: Treating Sigrid Schwebach/Extender:[name], [name], [name]: 1 Active Problems Location of Pain Severity and Description of Pain Patient Has Paino No Site Locations Pain Management and Medication Current Pain Management: Electronic Signature(s) Signed: 08/11/2019 7:44:20 AM By: Cherylin Mylar Entered By: Cherylin Mylar on 08/08/2019 13:15:01 -------------------------------------------------------------------------------- Patient/Caregiver Education Details Patient Name: Date of Service: Dennis Wyatt, Dennis Wyatt 1/25/2021andnbsp12:45 PM Medical Record [id number] Patient Account Number: [id number] Date of Birth/Gender: Treating RN: 1961/04/09 (59 y.o. [name] Primary Care Physician: [name] Other Clinician: Referring Physician: Treating Physician/Extender:[name], [name], [name]: 1 Education Assessment Education Provided To: Patient Education Topics Provided Wound/Skin Impairment: Methods: Explain/Verbal Responses: State content correctly Electronic Signature(s) Signed: 08/08/2019 6:46:58 PM By: Zandra Abts RN, BSN Entered By: Zandra Abts on 08/08/2019 14:06:04 -------------------------------------------------------------------------------- Wound Assessment Details Patient Name: Date of Service: Dennis Wyatt, Dennis Wyatt 08/08/2019 12:45 PM Medical Record [id number] Patient Account Number: [id number] Date of Birth/Sex: Treating RN: March 19, 1961 (58 y.o. M) Primary Care Jaylise Peek: [name] Other Clinician: Referring Indyah Saulnier: Treating Broxton Broady/Extender:[name], [name], [name]: 1 Wound Status Wound Number: 1 Primary Venous Leg Ulcer Etiology: Wound Location: Right Lower Leg -  Anterior Secondary Diabetic Wound/Ulcer of the Lower Wounding Event: Trauma Etiology: Extremity Date Acquired: 06/14/2019 Wound Open Weeks Of Treatment: 1 Status: Clustered Wound: No Comorbid Deep Vein Thrombosis, Hypertension, Type History: II Diabetes, Osteoarthritis Photos Wound Measurements Length: (cm) 3.6 % Reduct Width: (cm) 3 % Reduct Depth: (cm) 0.2 Epitheli Area: (cm) 8.482 Tunneli Volume: (cm) 1.696 Undermi Wound Description Classification: Full Thickness Without Exposed Support Foul Od Structures Slough/ Wound Wound Flat and Intact Margin: Exudate Medium Amount: Exudate Serosanguineous Type: Exudate red, brown Color: Wound Bed Granulation Amount: Medium (34-66%) Granulation Quality: Red Fascia E Necrotic Amount: Medium (34-66%) Fat Laye Necrotic Quality: Adherent Slough Tendon E Muscle E Joint Ex Bone Exp or After Cleansing: No Fibrino Yes Exposed Structure xposed: No r (Subcutaneous Tissue) Exposed: Yes xposed: No xposed: No posed: No osed: No ion in Area: -4.2% ion in Volume: -4.2% alization: None ng: No ning: No Treatment Notes Wound #1 (Right, Anterior Lower Leg) 1. Cleanse With Wound Cleanser Soap  and water 2. Periwound Care Moisturizing lotion TCA Cream 3. Primary Dressing Applied Iodoflex 4. Secondary Dressing ABD Pad Kerramax/Xtrasorb 6. Support Layer Applied 3 layer compression wrap Notes netting. Electronic Signature(s) Signed: 08/09/2019 5:09:30 PM By: Benjaman Kindler EMT/HBOT Entered By: Benjaman Kindler on 08/09/2019 13:28:59 -------------------------------------------------------------------------------- Wound Assessment Details Patient Name: Date of Service: JAXX, HUISH 08/08/2019 12:45 PM Medical Record Number:[id number] Patient Account Number: [id number] Date of Birth/Sex: Treating RN: 1961-03-08 (59 y.o. M) Primary Care Anik Wesch: [name] Other Clinician: Referring Jaleia Hanke: Treating  Aleli Navedo/Extender:[name], [name], [name]: 1 Wound Status Wound Number: 2 Primary Venous Leg Ulcer Etiology: Wound Location: Right Lower Leg - Lateral Secondary Diabetic Wound/Ulcer of the Lower Wounding Event: Gradually Appeared Etiology: Extremity Date Acquired: 07/11/2019 Wound Open Weeks Of Treatment: 1 Status: Clustered Wound: No Comorbid Deep Vein Thrombosis, Hypertension, Type History: II Diabetes, Osteoarthritis Photos Wound Measurements Length: (cm) 2.5 % Reduct Width: (cm) 2.1 % Reduct Depth: (cm) 0.4 Epitheli Area: (cm) 4.123 Tunneli Volume: (cm) 1.649 Undermi Wound Description Classification: Full Thickness Without Exposed Support Foul Odo Structures Slough/F Wound Flat and Intact Margin: Exudate Medium Amount: Exudate Serosanguineous Type: Exudate red, brown Color: Wound Bed Granulation Amount: Medium (34-66%) Granulation Quality: Red Fascia E Necrotic Amount: Medium (34-66%) Fat Laye Necrotic Quality: Adherent Slough Tendon E Muscle E Joint Ex Bone Exp r After Cleansing: No ibrino Yes Exposed Structure xposed: No r (Subcutaneous Tissue) Exposed: Yes xposed: No xposed: No posed: No osed: No ion in Area: -25% ion in Volume: -66.6% alization: None ng: No ning: No Treatment Notes Wound #2 (Right, Lateral Lower Leg) 1. Cleanse With Wound Cleanser Soap and water 2. Periwound Care Moisturizing lotion TCA Cream 3. Primary Dressing Applied Iodoflex 4. Secondary Dressing ABD Pad Kerramax/Xtrasorb 6. Support Layer Applied 3 layer compression wrap Notes netting. Electronic Signature(s) Signed: 08/09/2019 5:09:30 PM By: Benjaman Kindler EMT/HBOT Entered By: Benjaman Kindler on 08/09/2019 13:28:40 -------------------------------------------------------------------------------- Wound Assessment Details Patient Name: Date of Service: AVYON, HERENDEEN 08/08/2019 12:45 PM Medical Record Number:[id number] Patient  Account Number: [id number] Date of Birth/Sex: Treating RN: Dec 27, 1960 (59 y.o. M) Primary Care Darlette Dubow: [name] Other Clinician: Referring Icy Fuhrmann: Treating Deen Deguia/Extender:[name], [name], [name]: 1 Wound Status Wound Number: 3 Primary Venous Leg Ulcer Etiology: Wound Location: Right Lower Leg - Posterior Secondary Diabetic Wound/Ulcer of the Lower Wounding Event: Gradually Appeared Etiology: Extremity Date Acquired: 07/11/2019 Wound Open Weeks Of Treatment: 1 Status: Clustered Wound: No Comorbid Deep Vein Thrombosis, Hypertension, Type History: II Diabetes, Osteoarthritis Photos Wound Measurements Length: (cm) 1.7 % Reduct Width: (cm) 1.9 % Reduct Depth: (cm) 0.1 Epitheli Area: (cm) 2.537 Tunneli Volume: (cm) 0.254 Undermi Wound Description Classification: Full Thickness Without Exposed Support Foul Od Structures Slough/ Wound Flat and Intact Margin: Exudate Exudate Medium Amount: Exudate Serosanguineous Type: Exudate red, brown Color: Wound Bed Granulation Amount: Large (67-100%) Granulation Quality: Red Fascia Expo Necrotic Amount: Small (1-33%) Fat Layer ( Necrotic Quality: Adherent Slough Tendon Expo Muscle Expo Joint Expos Bone Expose or After Cleansing: No Fibrino Yes Exposed Structure sed: No Subcutaneous Tissue) Exposed: Yes sed: No sed: No ed: No d: No ion in Area: 28.2% ion in Volume: 28% alization: None ng: No ning: No Treatment Notes Wound #3 (Right, Posterior Lower Leg) 1. Cleanse With Wound Cleanser Soap and water 2. Periwound Care Moisturizing lotion TCA Cream 3. Primary Dressing Applied Iodoflex 4. Secondary Dressing ABD Pad Kerramax/Xtrasorb 6. Support Layer Applied 3 layer compression wrap Notes netting. Electronic Signature(s) Signed: 08/09/2019 5:09:30  PM By: Benjaman KindlerJones, Dedrick EMT/HBOT Entered By: Benjaman KindlerJones, Dedrick on 08/09/2019  13:28:16 -------------------------------------------------------------------------------- Vitals Details Patient Name: Date of Service: Jeannette HowHOMAS, Konor 08/08/2019 12:45 PM Medical Record [id number] Patient Account Number: [id number] Date of Birth/Sex: Treating RN: 1961/05/13 (59 y.o. Katherina RightM) Dwiggins, Shannon Primary Care Katheleen Stella: [name], Wilson Oliver Other Clinician: Referring Lema Heinkel: Treating Aysel Gilchrest/Extender:[name], Maeola SarahMichael Elkins, Curly RimWilson Oliver Weeks in Treatment: 1 Vital Signs Time Taken: 13:13 Temperature (F): 98.8 Height (in): 70 Pulse (bpm): 74 Source: Stated Respiratory Rate (breaths/min): 18 Weight (lbs): 302 Blood Pressure (mmHg): 145/72 Source: Stated Reference Range: 80 - 120 mg / dl Body Mass Index (BMI): 43.3 Electronic Signature(s) Signed: 08/11/2019 7:44:20 AM By: Cherylin Mylarwiggins, Shannon Entered By: Cherylin Mylarwiggins, Shannon on 08/08/2019 13:14:55

## 2019-08-12 NOTE — Progress Notes (Signed)
Dennis Wyatt, Dennis Wyatt ([id number]) Visit Report for 08/11/2019 Arrival Information Details Patient Name: Date of Service: Dennis Wyatt, Dennis Wyatt 08/11/2019 4:00 PM Medical Record Number:[id number] Patient Account Number: [id number] Date of Birth/Sex: Treating RN: May 15, 1961 (59 y.o. Bayard Hugger, Bonita Quin Primary Care Yanna Leaks: [name] Other Clinician: Referring Kayen Grabel: Treating Jakyle Petrucelli/Extender:[name], [name], [name]: 1 Visit Information History Since Last Visit Added or deleted any medications: No Patient Arrived: Ambulatory Any new allergies or adverse reactions: No Arrival Time: 16:12 Had a fall or experienced change in No Accompanied By: self activities of daily living that may affect Transfer Assistance: None risk of falls: Patient Identification Verified: Yes Signs or symptoms of abuse/neglect since last No Secondary Verification Process Yes visito Completed: Hospitalized since last visit: No Patient Requires Transmission-Based No Implantable device outside of the clinic excluding No Precautions: cellular tissue based products placed in the center Patient Has Alerts: No since last visit: Has Dressing in Place as Prescribed: Yes Pain Present Now: Yes Electronic Signature(s) Signed: 08/12/2019 8:33:08 AM By: Zenaida Deed RN, BSN Entered By: Zenaida Deed on 08/11/2019 16:13:58 -------------------------------------------------------------------------------- Compression Therapy Details Patient Name: Date of Service: Dennis Wyatt, Dennis Wyatt 08/11/2019 4:00 PM Medical Record [id number] Patient Account Number: [id number] Date of Birth/Sex: Treating RN: 05-30-1961 (59 y.o. Damaris Schooner Primary Care Madison Direnzo: [name] Other Clinician: Referring Kimyata Milich: Treating Deryl Ports/Extender:[name], [name], [name]: 1 Compression Therapy Performed for Wound Wound #1 Right,Anterior Lower  Leg Assessment: Performed By: Clinician Zenaida Deed, RN Compression Type: Three Emergency planning/management officer) Signed: 08/12/2019 8:33:08 AM By: Zenaida Deed RN, BSN Entered By: Zenaida Deed on 08/11/2019 16:33:36 -------------------------------------------------------------------------------- Encounter Discharge Information Details Patient Name: Date of Service: Dennis Wyatt, Dennis Wyatt 08/11/2019 4:00 PM Medical Record [id number] Patient Account Number: [id number] Date of Birth/Sex: Treating RN: 05/11/1961 (59 y.o. Damaris Schooner Primary Care Dahlila Pfahler: [name] Other Clinician: Referring Adalin Vanderploeg: Treating Jakeim Sedore/Extender:[name], [name], [name]: 1 Encounter Discharge Information Items Discharge Condition: Stable Ambulatory Status: Cane Discharge Destination: Home Transportation: Private Auto Accompanied By: self Schedule Follow-up Appointment: Yes Clinical Summary of Care: Patient Declined Electronic Signature(s) Signed: 08/12/2019 8:33:08 AM By: Zenaida Deed RN, BSN Entered By: Zenaida Deed on 08/11/2019 16:34:55 -------------------------------------------------------------------------------- Pain Assessment Details Patient Name: Date of Service: Dennis Wyatt, Dennis Wyatt 08/11/2019 4:00 PM Medical Record [id number] Patient Account Number: [id number] Date of Birth/Sex: Treating RN: 05-26-1961 (59 y.o. Damaris Schooner Primary Care Marymargaret Kirker: [name] Other Clinician: Referring Renatha Rosen: Treating Savien Mamula/Extender:[name], [name], [name]: 1 Active Problems Location of Pain Severity and Description of Pain Patient Has Paino Yes Site Locations Pain Location: Pain in Ulcers With Dressing Change: Yes Duration of the Pain. Constant / Intermittento Intermittent Rate the pain. Current Pain Level: 4 Character of Pain Describe the Pain: Burning, Throbbing Pain  Management and Medication Current Pain Management: Medication: Yes Leg drop or elevation: Yes Rest: Yes Is the Current Pain Management Adequate: Adequate How does your wound impact your activities of daily livingo Sleep: No Bathing: No Appetite: No Relationship With Others: No Bladder Continence: No Emotions: No Bowel Continence: No Work: No Toileting: No Drive: No Dressing: No Hobbies: No Electronic Signature(s) Signed: 08/12/2019 8:33:08 AM By: Zenaida Deed RN, BSN Entered By: Zenaida Deed on 08/11/2019 16:14:44 -------------------------------------------------------------------------------- Patient/Caregiver Education Details Patient Name: Date of Service: Dennis Wyatt, Dennis Wyatt 1/28/2021andnbsp4:00 PM Medical Record [id number] Patient Account Number: [id number] Date of Birth/Gender: Treating RN: Apr 02, 1961 (59 y.o. Damaris Schooner Primary Care Physician: [name] Other Clinician: Referring Physician:  Treating Physician/Extender:[name], [name], [name]: 1 Education Assessment Education Provided To: Patient Education Topics Provided Venous: Methods: Explain/Verbal Responses: Reinforcements needed, State content correctly Wound/Skin Impairment: Methods: Explain/Verbal Responses: Reinforcements needed, State content correctly Electronic Signature(s) Signed: 08/12/2019 8:33:08 AM By: Zenaida Deed RN, BSN Entered By: Zenaida Deed on 08/11/2019 16:34:39 -------------------------------------------------------------------------------- Wound Assessment Details Patient Name: Date of Service: Dennis Wyatt, Dennis Wyatt 08/11/2019 4:00 PM Medical Record [id number] Patient Account Number: [id number] Date of Birth/Sex: Treating RN: 1960/08/21 (59 y.o. Damaris Schooner Primary Care Herndon Grill: [name] Other Clinician: Referring Charisa Twitty: Treating Keelan Tripodi/Extender:[name], [name], [name] Weeks  in Treatment: 1 Wound Status Wound Number: 1 Primary Venous Leg Ulcer Etiology: Wound Location: Right Lower Leg - Anterior Secondary Diabetic Wound/Ulcer of the Lower Wounding Event: Trauma Etiology: Extremity Date Acquired: 06/14/2019 Wound Open Weeks Of Treatment: 1 Status: Clustered Wound: No Comorbid Deep Vein Thrombosis, Hypertension, History: Type II Diabetes, Osteoarthritis Wound Measurements Length: (cm) 3.6 % Reduct Width: (cm) 3 % Reduct Depth: (cm) 0.2 Epitheli Area: (cm) 8.482 Tunneli Volume: (cm) 1.696 Undermi Wound Description Classification: Full Thickness Without Exposed Support Structures Wound Flat and Intact Margin: Exudate Medium Amount: Exudate Serosanguineous Type: Exudate red, brown Color: Wound Bed Granulation Amount: Medium (34-66%) Granulation Quality: Red Necrotic Amount: Medium (34-66%) Necrotic Quality: Adherent Slough Foul Odor After Cleansing: No Slough/Fibrino Yes Exposed Structure Fascia Exposed: No Fat Layer (Subcutaneous Tissue) Exposed: Yes Tendon Exposed: No Muscle Exposed: No Joint Exposed: No Bone Exposed: No ion in Area: -4.2% ion in Volume: -4.2% alization: None ng: No ning: No Treatment Notes Wound #1 (Right, Anterior Lower Leg) 2. Periwound Care Moisturizing lotion TCA Cream 3. Primary Dressing Applied Iodoflex 4. Secondary Dressing ABD Pad Dry Gauze 6. Support Layer Applied 3 layer compression Cytogeneticist) Signed: 08/12/2019 8:33:08 AM By: Zenaida Deed RN, BSN Entered By: Zenaida Deed on 08/11/2019 16:32:56 -------------------------------------------------------------------------------- Wound Assessment Details Patient Name: Date of Service: Dennis Wyatt, Dennis Wyatt 08/11/2019 4:00 PM Medical Record [id number] Patient Account Number: [id number] Date of Birth/Sex: Treating RN: 03/03/1961 (58 y.o. Damaris Schooner Primary Care Macklin Jacquin: [name] Other  Clinician: Referring Christos Mixson: Treating Kymia Simi/Extender:[name], [name], [name]: 1 Wound Status Wound Number: 2 Primary Venous Leg Ulcer Etiology: Wound Location: Right Lower Leg - Lateral Secondary Diabetic Wound/Ulcer of the Lower Wounding Event: Gradually Appeared Etiology: Extremity Date Acquired: 07/11/2019 Wound Open Weeks Of Treatment: 1 Status: Clustered Wound: No Comorbid Deep Vein Thrombosis, Hypertension, History: Type II Diabetes, Osteoarthritis Wound Measurements Length: (cm) 2.5 % Reduct Width: (cm) 2.1 % Reduct Depth: (cm) 0.4 Epitheli Area: (cm) 4.123 Tunneli Volume: (cm) 1.649 Undermi Wound Description Classification: Full Thickness Without Exposed Support Foul Od Structures Slough/ Wound Flat and Intact Margin: Exudate Medium Amount: Exudate Serosanguineous Type: Exudate red, brown Color: Wound Bed Granulation Amount: Medium (34-66%) Granulation Quality: Red Fascia Exp Necrotic Amount: Medium (34-66%) Fat Layer Necrotic Quality: Adherent Slough Tendon Exp Muscle Exp Joint Expo Bone Expos or After Cleansing: No Fibrino Yes Exposed Structure osed: No (Subcutaneous Tissue) Exposed: Yes osed: No osed: No sed: No ed: No ion in Area: -25% ion in Volume: -66.6% alization: None ng: No ning: No Treatment Notes Wound #2 (Right, Lateral Lower Leg) 2. Periwound Care Moisturizing lotion TCA Cream 3. Primary Dressing Applied Iodoflex 4. Secondary Dressing ABD Pad Dry Gauze 6. Support Layer Applied 3 layer compression Cytogeneticist) Signed: 08/12/2019 8:33:08 AM By: Zenaida Deed RN, BSN Entered By: Zenaida Deed on 08/11/2019 16:33:12 -------------------------------------------------------------------------------- Wound Assessment Details  Patient Name: Date of Service: Dennis Wyatt, Dennis Wyatt 08/11/2019 4:00 PM Medical Record [id number] Patient Account Number: [id number] Date of  Birth/Sex: Treating RN: Jul 12, 1961 (59 y.o. Ernestene Mention Primary Care Otto Caraway: [name] Other Clinician: Referring Frimet Durfee: Treating Katriana Dortch/Extender:[name], [name], [name] Weeks in Treatment: 1 Wound Status Wound Number: 3 Primary Venous Leg Ulcer Etiology: Wound Location: Right Lower Leg - Posterior Secondary Diabetic Wound/Ulcer of the Lower Wounding Event: Gradually Appeared Etiology: Extremity Date Acquired: 07/11/2019 Wound Open Weeks Of Treatment: 1 Status: Clustered Wound: No Comorbid Deep Vein Thrombosis, Hypertension, History: Type II Diabetes, Osteoarthritis Wound Measurements Length: (cm) 1.7 % Reduct Width: (cm) 1.9 % Reduct Depth: (cm) 0.1 Epitheli Area: (cm) 2.537 Tunneli Volume: (cm) 0.254 Undermi Wound Description Classification: Full Thickness Without Exposed Support Foul Od Structures Slough/ Wound Flat and Intact Margin: Exudate Medium Amount: Exudate Serosanguineous Type: Exudate red, brown Color: Wound Bed Granulation Amount: Medium (34-66%) Granulation Quality: Red Fascia E Necrotic Amount: Medium (34-66%) Fat Laye Necrotic Quality: Adherent Slough Tendon E Muscle E Joint Ex Bone Exp or After Cleansing: No Fibrino Yes Exposed Structure xposed: No r (Subcutaneous Tissue) Exposed: Yes xposed: No xposed: No posed: No osed: No ion in Area: 28.2% ion in Volume: 28% alization: None ng: No ning: No Treatment Notes Wound #3 (Right, Posterior Lower Leg) 2. Periwound Care Moisturizing lotion TCA Cream 3. Primary Dressing Applied Iodoflex 4. Secondary Dressing ABD Pad Dry Gauze 6. Support Layer Applied 3 layer compression Water quality scientist) Signed: 08/12/2019 8:33:08 AM By: Baruch Gouty RN, BSN Entered By: Baruch Gouty on 08/11/2019 16:33:23 -------------------------------------------------------------------------------- [place] Details Patient Name: Date of  Service: NATHIAN, Dennis Wyatt 08/11/2019 4:00 PM Medical Record [id number] Patient Account Number: [id number] Date of Birth/Sex: Treating RN: 1961/04/10 (59 y.o. Ernestene Mention Primary Care Shervon Kerwin: [name] Other Clinician: Referring Zayneb Baucum: Treating Alazne Quant/Extender:[name], [name], [name] Weeks in Treatment: 1 Vital Signs Time Taken: 16:15 Temperature (F): 98.1 Height (in): 70 Pulse (bpm): 98 Source: Stated Respiratory Rate (breaths/min): 18 Weight (lbs): 302 Blood Pressure (mmHg): 127/83 Source: Stated Reference Range: 80 - 120 mg / dl Body Mass Index (BMI): 43.3 Electronic Signature(s) Signed: 08/12/2019 8:33:08 AM By: Baruch Gouty RN, BSN Entered By: Baruch Gouty on 08/11/2019 16:19:12

## 2019-08-12 NOTE — Progress Notes (Signed)
LEOVANNI, BJORKMAN ([id number]) Visit Report for 08/11/2019 SuperBill Details Patient Name: Date of Service: STONEY, KARCZEWSKI 08/11/2019 Medical Record Number:[id number] Patient Account Number: [id number] Date of Birth/Sex: Treating RN: 1961-01-04 (59 y.o. Damaris Schooner Primary Care Provider: [name] Other Clinician: Referring Provider: Treating Provider/Extender:Lindalee Huizinga, [name], [name]: 1 Diagnosis Coding ICD-10 Codes Code Description [phone] Non-pressure chronic ulcer of other part of right lower leg with fat layer exposed I87.331 Chronic venous hypertension (idiopathic) with ulcer and inflammation of right lower extremity E11.622 Type 2 diabetes mellitus with other skin ulcer E11.42 Type 2 diabetes mellitus with diabetic polyneuropathy Facility Procedures CPT4 Code Description Modifier Quantity [phone] (Facility Use Only) [phone] - APPLY MULTLAY COMPRS LWR RT LEG 1 Electronic Signature(s) Signed: 08/11/2019 5:54:40 PM By: [name] MD Signed: 08/12/2019 8:33:08 AM By: Zenaida Deed RN, BSN Entered By: Zenaida Deed on 08/11/2019 16:35:07

## 2019-08-15 ENCOUNTER — Other Ambulatory Visit: Payer: Self-pay

## 2019-08-15 ENCOUNTER — Encounter (HOSPITAL_BASED_OUTPATIENT_CLINIC_OR_DEPARTMENT_OTHER): Payer: PPO | Admitting: Internal Medicine

## 2019-08-15 DIAGNOSIS — E1142 Type 2 diabetes mellitus with diabetic polyneuropathy: Secondary | ICD-10-CM | POA: Insufficient documentation

## 2019-08-15 DIAGNOSIS — E11622 Type 2 diabetes mellitus with other skin ulcer: Secondary | ICD-10-CM | POA: Diagnosis not present

## 2019-08-15 DIAGNOSIS — L97812 Non-pressure chronic ulcer of other part of right lower leg with fat layer exposed: Secondary | ICD-10-CM | POA: Diagnosis not present

## 2019-08-15 DIAGNOSIS — Z87891 Personal history of nicotine dependence: Secondary | ICD-10-CM | POA: Insufficient documentation

## 2019-08-15 DIAGNOSIS — I87311 Chronic venous hypertension (idiopathic) with ulcer of right lower extremity: Secondary | ICD-10-CM | POA: Diagnosis not present

## 2019-08-15 DIAGNOSIS — L97212 Non-pressure chronic ulcer of right calf with fat layer exposed: Secondary | ICD-10-CM | POA: Diagnosis not present

## 2019-08-15 DIAGNOSIS — I10 Essential (primary) hypertension: Secondary | ICD-10-CM | POA: Diagnosis not present

## 2019-08-15 DIAGNOSIS — Z96642 Presence of left artificial hip joint: Secondary | ICD-10-CM | POA: Diagnosis not present

## 2019-08-15 NOTE — Progress Notes (Signed)
[place], FANT ([id number]) Visit Report for 08/15/2019 HPI Details Patient Name: Date of Service: Dennis Wyatt, Dennis Wyatt 08/15/2019 1:45 PM Medical Record Number:[id number] Patient Account Number: [id number] Date of Birth/Sex: Treating RN: September 20, 1960 (59 y.o. M) Primary Care Provider: [name] Other Clinician: Referring Provider: Treating Provider/Extender:, [name], [name]: 2 History of Present Illness HPI Description: ADMISSION 08/01/2019 This is a 59 year old man who is essentially self-referred although he gets his primary care at [facility]. We did not have any information from them. He is here for review of 3 wounds on the right lower leg. He states about a month and a half ago he dropped his cell phone on the right anterior leg creating a wound. More recently he has had 2 blisters come out 1 medially just above the lateral malleolus and one on the posterior calf. All of these are roughly the same condition however. He has been applying some form of ointment were not really sure what this is. He does not have a wound history. 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 ABI in our clinic was 0.95 1/25; the patient has 3 wounds on the right leg. One is on the right anterior 1 just above the lateral malleolus and one on the posterior calf. We have been using Iodoflex under compression. The base of these wounds appears better 2/1; still the 3 wounds one on the right anterior 1 on the lateral and 1 on the posterior calf we have been using Iodoflex under compression making some general improvement in the wound bed. The patient went for his reflux studies on 1/28. This showed reflux in the common femoral vein and greater than 1 second in the greater  saphenous vein at the saphenofemoral junction but nothing in the more superficial veins below the knee either greater or lesser saphenous. I therefore I do not think he is going to be M amenable for an ablation. The report did note an aging consistent deep venous thrombosis involving the right popliteal artery. This led me to research [facility]. He had a similar finding on a an arterial duplex study in 2014 at which time he was recovering from a subarachnoid hemorrhage. This was seen by the rehab team who admitted him to rehab after the surgery for an anterior cerebral aneurysm. By that time it was felt safe to anticoagulate him and he was put on Xarelto and continued that according to the patient to the last year or 2 when it was stopped and he is only on aspirin. Electronic Signature(s) Signed: 08/15/2019 6:41:12 PM By: [name] MD Entered By: [name] on 08/15/2019 15:49:09 -------------------------------------------------------------------------------- Physical Exam Details Patient Name: Date of Service: Dennis Wyatt 08/15/2019 1:45 PM Medical Record [id number] Patient Account Number: [id number] Date of Birth/Sex: Treating RN: 10/28/1960 (58 y.o. M) Primary Care Provider: [name] Other Clinician: Referring Provider: Treating Provider/Extender:, [name], [name]: 2 Constitutional Sitting or standing Blood Pressure is within target range for patient.. Pulse regular and within target range for patient.Marland Kitchen Respirations regular, non-labored and within target range.. Temperature is normal and within the target range for the patient.Marland Kitchen Appears in no distress. Respiratory work of breathing is normal. Cardiovascular Needle pulses are intact. Edema control in the right lower leg is also quite good.. Notes Wound exam Major wound is on the lower leg  and tibia. There is also an area in the posterior calf. Although these  appear to be punched out. They have an improvement in the base of the wound in terms of appearance but not much change in wound area. No evidence of infection around the wound Electronic Signature(s) Signed: 08/15/2019 6:41:12 PM By: [name] MD Entered By: [name] on 08/15/2019 15:50:36 -------------------------------------------------------------------------------- Physician Orders Details Patient Name: Date of Service: Dennis Wyatt 08/15/2019 1:45 PM Medical Record [id number] Patient Account Number: [id number] Date of Birth/Sex: Treating RN: 1960/10/07 (58 y.o. [name] Primary Care Provider: [name] Other Clinician: Referring Provider: Treating Provider/Extender:, [name], [name]: 2 Verbal / Phone Orders: No Diagnosis Coding ICD-10 Coding Code Description [phone] Non-pressure chronic ulcer of other part of right lower leg with fat layer exposed I87.331 Chronic venous hypertension (idiopathic) with ulcer and inflammation of right lower extremity E11.622 Type 2 diabetes mellitus with other skin ulcer E11.42 Type 2 diabetes mellitus with diabetic polyneuropathy Follow-up Appointments Return Appointment in 1 week. Dressing Change Frequency Do not change entire dressing for one week. Skin Barriers/Peri-Wound Care Moisturizing lotion TCA Cream or Ointment - mixed with lotion Wound Cleansing May shower with protection. - may use cast protector Primary Wound Dressing Wound #1 Right,Anterior Lower Leg Iodoflex Wound #2 Right,Lateral Lower Leg Iodoflex Wound #3 Right,Posterior Lower Leg Iodoflex Secondary Dressing Wound #1 Right,Anterior Lower Leg ABD pad Zetuvit or Kerramax Wound #2 Right,Lateral Lower Leg ABD pad Zetuvit or Kerramax Wound #3 Right,Posterior Lower Leg ABD pad Zetuvit or Kerramax Edema Control 3 Layer Compression System - Right Lower Extremity Avoid standing for long  periods of time Elevate legs to the level of the heart or above for 30 minutes daily and/or when sitting, a frequency of: - throughout the day Exercise regularly Electronic Signature(s) Signed: 08/15/2019 6:39:44 PM By: Zandra Abts RN, BSN Signed: 08/15/2019 6:41:12 PM By: [name] MD Entered By: Zandra Abts on 08/15/2019 15:28:04 -------------------------------------------------------------------------------- Problem List Details Patient Name: Date of Service: NIEL, PERETTI 08/15/2019 1:45 PM Medical Record [id number] Patient Account Number: [id number] Date of Birth/Sex: Treating RN: 1961-05-10 (58 y.o. [name] Primary Care Provider: [name] Other Clinician: Referring Provider: Treating Provider/Extender:, [name], [name]: 2 Active Problems ICD-10 Evaluated Encounter Code Description Active Date Today Diagnosis L97.812 Non-pressure chronic ulcer of other part of right lower 08/01/2019 No Yes leg with fat layer exposed I87.331 Chronic venous hypertension (idiopathic) with ulcer 08/01/2019 No Yes and inflammation of right lower extremity E11.622 Type 2 diabetes mellitus with other skin ulcer 08/01/2019 No Yes E11.42 Type 2 diabetes mellitus with diabetic polyneuropathy 08/01/2019 No Yes Inactive Problems Resolved Problems Electronic Signature(s) Signed: 08/15/2019 6:41:12 PM By: [name] MD Entered By: [name] on 08/15/2019 15:46:34 -------------------------------------------------------------------------------- Progress Note Details Patient Name: Date of Service: BOWDEN, BOODY 08/15/2019 1:45 PM Medical Record [id number] Patient Account Number: [id number] Date of Birth/Sex: Treating RN: 1960/08/12 (58 y.o. M) Primary Care Provider: [name] Other Clinician: Referring Provider: Treating Provider/Extender:, [name], [name]:  2 Subjective History of Present Illness (HPI) ADMISSION 08/01/2019 This is a 59 year old man who is essentially self-referred although he gets his primary care at [facility]. We did not have any information from them. He is here for review of 3 wounds on the right lower leg. He states about a month and a half ago he dropped his cell phone on the right anterior leg creating a wound. More recently  he has had 2 blisters come out 1 medially just above the lateral malleolus and one on the posterior calf. All of these are roughly the same condition however. He has been applying some form of ointment were not really sure what this is. He does not have a wound history. 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 ABI in our clinic was 0.95 1/25; the patient has 3 wounds on the right leg. One is on the right anterior 1 just above the lateral malleolus and one on the posterior calf. We have been using Iodoflex under compression. The base of these wounds appears better 2/1; still the 3 wounds one on the right anterior 1 on the lateral and 1 on the posterior calf we have been using Iodoflex under compression making some general improvement in the wound bed. The patient went for his reflux studies on 1/28. This showed reflux in the common femoral vein and greater than 1 second in the greater saphenous vein at the saphenofemoral junction but nothing in the more superficial veins below the knee either greater or lesser saphenous. I therefore I do not think he is going to be M amenable for an ablation. The report did note an aging consistent deep venous thrombosis involving the right popliteal artery. This led me to research [facility] link. He had a similar finding on a an arterial duplex study in 2014 at which  time he was recovering from a subarachnoid hemorrhage. This was seen by the rehab team who admitted him to rehab after the surgery for an anterior cerebral aneurysm. By that time it was felt safe to anticoagulate him and he was put on Xarelto and continued that according to the patient to the last year or 2 when it was stopped and he is only on aspirin. Objective Constitutional Sitting or standing Blood Pressure is within target range for patient.. Pulse regular and within target range for patient.Marland Kitchen Respirations regular, non-labored and within target range.. Temperature is normal and within the target range for the patient.Marland Kitchen Appears in no distress. Vitals Time Taken: 2:30 PM, Height: 70 in, Source: Stated, Weight: 302 lbs, Source: Stated, BMI: 43.3, Temperature: 98.3 F, Pulse: 66 bpm, Respiratory Rate: 20 breaths/min, Blood Pressure: 138/88 mmHg. Respiratory work of breathing is normal. Cardiovascular Needle pulses are intact. Edema control in the right lower leg is also quite good.. General Notes: Wound exam ooMajor wound is on the lower leg and tibia. There is also an area in the posterior calf. Although these appear to be punched out. They have an improvement in the base of the wound in terms of appearance but not much change in wound area. No evidence of infection around the wound Integumentary (Hair, Skin) Wound #1 status is Open. Original cause of wound was Trauma. The wound is located on the Right,Anterior Lower Leg. The wound measures 3.3cm length x 2.7cm width x 0.2cm depth; 6.998cm^2 area and 1.4cm^3 volume. There is Fat Layer (Subcutaneous Tissue) Exposed exposed. There is no tunneling or undermining noted. There is a small amount of serosanguineous drainage noted. The wound margin is flat and intact. There is large (67-100%) red granulation within the wound bed. There is a small (1-33%) amount of necrotic tissue within the wound bed including Adherent Slough. Wound #2 status  is Open. Original cause of wound was Gradually Appeared. The wound is located on  the Right,Lateral Lower Leg. The wound measures 2.1cm length x 1.5cm width x 0.2cm depth; 2.474cm^2 area and 0.495cm^3 volume. There is Fat Layer (Subcutaneous Tissue) Exposed exposed. There is no tunneling or undermining noted. There is a small amount of serosanguineous drainage noted. The wound margin is flat and intact. There is medium (34-66%) red granulation within the wound bed. There is a medium (34-66%) amount of necrotic tissue within the wound bed including Adherent Slough. Wound #3 status is Open. Original cause of wound was Gradually Appeared. The wound is located on the Right,Posterior Lower Leg. The wound measures 1.4cm length x 1.7cm width x 0.1cm depth; 1.869cm^2 area and 0.187cm^3 volume. There is Fat Layer (Subcutaneous Tissue) Exposed exposed. There is no tunneling or undermining noted. There is a small amount of serosanguineous drainage noted. The wound margin is flat and intact. There is small (1-33%) pink granulation within the wound bed. There is a large (67-100%) amount of necrotic tissue within the wound bed including Adherent Slough. Assessment Active Problems ICD-10 Non-pressure chronic ulcer of other part of right lower leg with fat layer exposed Chronic venous hypertension (idiopathic) with ulcer and inflammation of right lower extremity Type 2 diabetes mellitus with other skin ulcer Type 2 diabetes mellitus with diabetic polyneuropathy Procedures Wound #1 Pre-procedure diagnosis of Wound #1 is a Venous Leg Ulcer located on the Right,Anterior Lower Leg . There was a Three Layer Compression Therapy Procedure by [name], RN. Post procedure Diagnosis Wound #1: Same as Pre-Procedure Wound #2 Pre-procedure diagnosis of Wound #2 is a Venous Leg Ulcer located on the Right,Lateral Lower Leg . There was a Three Layer Compression Therapy Procedure by [name], RN. Post procedure  Diagnosis Wound #2: Same as Pre-Procedure Wound #3 Pre-procedure diagnosis of Wound #3 is a Venous Leg Ulcer located on the Right,Posterior Lower Leg . There was a Three Layer Compression Therapy Procedure by [name], RN. Post procedure Diagnosis Wound #3: Same as Pre-Procedure Plan Follow-up Appointments: Return Appointment in 1 week. Dressing Change Frequency: Do not change entire dressing for one week. Skin Barriers/Peri-Wound Care: Moisturizing lotion TCA Cream or Ointment - mixed with lotion Wound Cleansing: May shower with protection. - may use cast protector Primary Wound Dressing: Wound #1 Right,Anterior Lower Leg: Iodoflex Wound #2 Right,Lateral Lower Leg: Iodoflex Wound #3 Right,Posterior Lower Leg: Iodoflex Secondary Dressing: Wound #1 Right,Anterior Lower Leg: ABD pad Zetuvit or Kerramax Wound #2 Right,Lateral Lower Leg: ABD pad Zetuvit or Kerramax Wound #3 Right,Posterior Lower Leg: ABD pad Zetuvit or Kerramax Edema Control: 3 Layer Compression System - Right Lower Extremity Avoid standing for long periods of time Elevate legs to the level of the heart or above for 30 minutes daily and/or when sitting, a frequency of: - throughout the day Exercise regularly 1. I am continuing Iodoflex 2. His venous reflux study was not suggestive of superficial vein reflux amenable to ablation. 3. He had an age indeterminant DVT in the popliteal artery however with research this was actually present in 2014 and he was treated at that time for I think 3 or 4 years. 4. He does not have any edema in the thigh we measured his thighs bilaterally that are roughly the same or within the margin of area. There is no evidence of central central venous obstruction 5. He has health team advantage I think he will have a large co-pay for any treatments of we use Electronic Signature(s) Signed: 08/15/2019 6:41:12 PM By: Linton Ham MD Entered By: Linton Ham on 08/15/2019  15:52:11 -------------------------------------------------------------------------------- SuperBill Details Patient Name: Date of Service: Jeannette HowHOMAS, Joshuan 08/15/2019 Medical Record Number:[id number] Patient Account Number: [id number] Date of Birth/Sex: Treating RN: 01-Jul-1961 (59 y.o. M) Primary Care Provider: [name], Wilson Oliver Other Clinician: Referring Provider: Treating Provider/Extender:, [name] Elkins, Curly RimWilson Oliver Weeks in Treatment: 2 Diagnosis Coding ICD-10 Codes Code Description [phone] Non-pressure chronic ulcer of other part of right lower leg with fat layer exposed I87.331 Chronic venous hypertension (idiopathic) with ulcer and inflammation of right lower extremity E11.622 Type 2 diabetes mellitus with other skin ulcer E11.42 Type 2 diabetes mellitus with diabetic polyneuropathy Facility Procedures CPT4 Code Description: [id number] (Facility Use Only) [phone] - APPLY MULTLAY COMPRS LWR RT LEG Modifier: Quantity: 1 Physician Procedures CPT4 Code Description: [id number] - WC PHYS LEVEL 4 - EST PT ICD-10 Diagnosis Description L97.812 Non-pressure chronic ulcer of other part of right lower le I87.331 Chronic venous hypertension (idiopathic) with ulcer and in lower extremity Modifier: g with fat layer flammation of ri Quantity: 1 exposed ght Electronic Signature(s) Signed: 08/15/2019 6:39:44 PM By: Zandra AbtsLynch, Shatara RN, BSN Signed: 08/15/2019 6:41:12 PM By: Baltazar Najjarobson,  MD Entered By: Zandra AbtsLynch, Shatara on 08/15/2019 18:27:41

## 2019-08-16 NOTE — Progress Notes (Addendum)
TALLIE, HEVIA ([id number]) Visit Report for 08/15/2019 Arrival Information Details Patient Name: Date of Service: ESTEVEN, OVERFELT 08/15/2019 1:45 PM Medical Record Number:[id number] Patient Account Number: [id number] Date of Birth/Sex: Treating RN: Aug 26, 1960 (59 y.o. Bayard Hugger, Bonita Quin Primary Care Quinnley Colasurdo: [name] Other Clinician: Referring Mathis Cashman: Treating Kamarah Bilotta/Extender:[name], [name], [name]: 2 Visit Information History Since Last Visit Added or deleted any medications: No Patient Arrived: Ambulatory Any new allergies or adverse reactions: No Arrival Time: 14:27 Had a fall or experienced change in No Accompanied By: self activities of daily living that may affect Transfer Assistance: None risk of falls: Patient Identification Verified: Yes Signs or symptoms of abuse/neglect since last No Secondary Verification Process Completed: Yes visito Patient Requires Transmission-Based No Hospitalized since last visit: No Precautions: Implantable device outside of the clinic excluding No Patient Has Alerts: No cellular tissue based products placed in the center since last visit: Has Dressing in Place as Prescribed: Yes Has Compression in Place as Prescribed: Yes Pain Present Now: No Electronic Signature(s) Signed: 08/16/2019 5:43:30 PM By: Zenaida Deed RN, BSN Entered By: Zenaida Deed on 08/15/2019 14:30:01 -------------------------------------------------------------------------------- Compression Therapy Details Patient Name: Date of Service: AIDENJAMES, HECKMANN 08/15/2019 1:45 PM Medical Record [id number] Patient Account Number: [id number] Date of Birth/Sex: Treating RN: April 19, 1961 (58 y.o. [name] Primary Care Teriana Danker: [name] Other Clinician: Referring Madyn Ivins: Treating Zillah Alexie/Extender:[name], [name], [name]: 2 Compression Therapy Performed for Wound  Wound #1 Right,Anterior Lower Leg Assessment: Performed By: Clinician Zandra Abts, RN Compression Type: Three Layer Post Procedure Diagnosis Same as Pre-procedure Electronic Signature(s) Signed: 08/15/2019 6:39:44 PM By: Zandra Abts RN, BSN Entered By: Zandra Abts on 08/15/2019 15:28:50 -------------------------------------------------------------------------------- Compression Therapy Details Patient Name: Date of Service: TARUS, BRISKI 08/15/2019 1:45 PM Medical Record [id number] Patient Account Number: [id number] Date of Birth/Sex: Treating RN: Nov 11, 1960 (58 y.o. [name] Primary Care Kelise Kuch: [name] Other Clinician: Referring Tucker Steedley: Treating Kael Keetch/Extender:[name], [name], [name]: 2 Compression Therapy Performed for Wound Wound #2 Right,Lateral Lower Leg Assessment: Performed By: Clinician Zandra Abts, RN Compression Type: Three Layer Post Procedure Diagnosis Same as Pre-procedure Electronic Signature(s) Signed: 08/15/2019 6:39:44 PM By: Zandra Abts RN, BSN Entered By: Zandra Abts on 08/15/2019 15:28:50 -------------------------------------------------------------------------------- Compression Therapy Details Patient Name: Date of Service: KEYLIN, PODOLSKY 08/15/2019 1:45 PM Medical Record [id number] Patient Account Number: [id number] Date of Birth/Sex: Treating RN: [birth year] (58 y.o. [name] Primary Care Daviel Allegretto: [name] Other Clinician: Referring Monifa Blanchette: Treating Rosana Farnell/Extender:[name], [name], [name]: 2 Compression Therapy Performed for Wound Wound #3 Right,Posterior Lower Leg Assessment: Performed By: Clinician Zandra Abts, RN Compression Type: Three Layer Post Procedure Diagnosis Same as Pre-procedure Electronic Signature(s) Signed: 08/15/2019 6:39:44 PM By: Zandra Abts RN, BSN Entered By: Zandra Abts  on 08/15/2019 15:28:50 -------------------------------------------------------------------------------- Encounter Discharge Information Details Patient Name: Date of Service: DELANCE, WEIDE 08/15/2019 1:45 PM Medical Record [id number] Patient Account Number: [id number] Date of Birth/Sex: Treating RN: 29-Mar-1961 (58 y.o. Damaris Schooner Primary Care Azarria Balint: [name] Other Clinician: Referring Sruti Ayllon: Treating Alizaya Oshea/Extender:[name], [name], [name]: 2 Encounter Discharge Information Items Discharge Condition: Stable Ambulatory Status: Cane Discharge Destination: Home Transportation: Private Auto Accompanied By: self Schedule Follow-up Appointment: Yes Clinical Summary of Care: Patient Declined Electronic Signature(s) Signed: 08/16/2019 5:43:30 PM By: Zenaida Deed RN, BSN Entered By: Zenaida Deed on 08/15/2019 15:47:14 -------------------------------------------------------------------------------- Lower Extremity Assessment Details Patient Name: Date of Service: JAWON, DIPIERO 08/15/2019  1:45 PM Medical Record [id number] Patient Account Number: [id number] Date of Birth/Sex: Treating RN: Apr 14, 1961 (59 y.o. Bayard Hugger, Bonita Quin Primary Care Vincen Bejar: [name] Other Clinician: Referring Rosangelica Pevehouse: Treating Angeliah Wisdom/Extender:[name], [name], [name]: 2 Edema Assessment Assessed: [Left: No] [Right: No] Edema: [Left: Ye] [Right: s] Calf Left: Right: Point of Measurement: 36 cm From Medial Instep cm 42 cm Ankle Left: Right: Point of Measurement: 9 cm From Medial Instep cm 24.5 cm Vascular Assessment Pulses: Dorsalis Pedis Palpable: [Right:Yes] Notes Right calf = 51.5 cm, Left calf = 49.5 cm Electronic Signature(s) Signed: 08/15/2019 6:39:44 PM By: Zandra Abts RN, BSN Signed: 08/16/2019 5:43:30 PM By: Zenaida Deed RN, BSN Entered By: Zandra Abts on 08/15/2019  15:26:45 -------------------------------------------------------------------------------- Multi Wound Chart Details Patient Name: Date of Service: TOMY, KHIM 08/15/2019 1:45 PM Medical Record [id number] Patient Account Number: [id number] Date of Birth/Sex: Treating RN: 26-Oct-1960 (58 y.o. M) Primary Care Dewane Timson: [name] Other Clinician: Referring Adham Johnson: Treating Rane Blitch/Extender:[name], [name], [name]: 2 Vital Signs Height(in): 70 Pulse(bpm): 66 Weight(lbs): 302 Blood Pressure(mmHg): 138/88 Body Mass Index(BMI): 43 Temperature(F): 98.3 Respiratory 20 Rate(breaths/min): Photos: [1:No Photos] [2:No Photos] [3:No Photos] Wound Location: [1:Right Lower Leg - Anterior Right Lower Leg - Lateral Right Lower Leg - Posterior] Wounding Event: [1:Trauma] [2:Gradually Appeared] [3:Gradually Appeared] Primary Etiology: [1:Venous Leg Ulcer] [2:Venous Leg Ulcer] [3:Venous Leg Ulcer] Secondary Etiology: [1:Diabetic Wound/Ulcer of the Diabetic Wound/Ulcer of the Diabetic Wound/Ulcer of the Lower Extremity] [2:Lower Extremity] [3:Lower Extremity] Comorbid History: [1:Deep Vein Thrombosis, Hypertension, Type II Diabetes, Osteoarthritis] [2:Deep Vein Thrombosis, Hypertension, Type II Diabetes, Osteoarthritis] [3:Deep Vein Thrombosis, Hypertension, Type II Diabetes, Osteoarthritis] Date Acquired: [1:06/14/2019] [2:07/11/2019] [3:07/11/2019] Weeks of Treatment: [1:2] [2:2] [3:2] Wound Status: [1:Open] [2:Open] [3:Open] Measurements L x W x D 3.3x2.7x0.2 [2:2.1x1.5x0.2] [3:1.4x1.7x0.1] (cm) Area (cm) : [1:6.998] [2:2.474] [3:1.869] Volume (cm) : [1:1.4] [2:0.495] [3:0.187] % Reduction in Area: [1:14.00%] [2:25.00%] [3:47.10%] % Reduction in Volume: [1:14.00%] [2:50.00%] [3:47.00%] Classification: [1:Full Thickness Without Exposed Support Structures Exposed Support Structures Exposed Support Structures] [2:Full Thickness Without] [3:Full  Thickness Without] Exudate Amount: [1:Small] [2:Small] [3:Small] Exudate Type: [1:Serosanguineous] [2:Serosanguineous] [3:Serosanguineous] Exudate Color: [1:red, brown] [2:red, brown] [3:red, brown] Wound Margin: [1:Flat and Intact] [2:Flat and Intact] [3:Flat and Intact] Granulation Amount: [1:Large (67-100%)] [2:Medium (34-66%)] [3:Small (1-33%)] Granulation Quality: [1:Red] [2:Red] [3:Pink] Necrotic Amount: [1:Small (1-33%)] [2:Medium (34-66%)] [3:Large (67-100%)] Exposed Structures: [1:Fat Layer (Subcutaneous Fat Layer (Subcutaneous Fat Layer (Subcutaneous Tissue) Exposed: Yes Fascia: No Tendon: No Muscle: No Joint: No Bone: No] [2:Tissue) Exposed: Yes Fascia: No Tendon: No Muscle: No Joint: No Bone: No] [3:Tissue) Exposed: Yes  Fascia: No Tendon: No Muscle: No Joint: No Bone: No] Epithelialization: [1:None Compression Therapy] [2:None Compression Therapy] [3:None Compression Therapy] Treatment Notes Wound #1 (Right, Anterior Lower Leg) 2. Periwound Care Moisturizing lotion TCA Cream 3. Primary Dressing Applied Iodoflex 4. Secondary Dressing ABD Pad Dry Gauze 6. Support Layer Applied 3 layer compression wrap Wound #2 (Right, Lateral Lower Leg) 2. Periwound Care Moisturizing lotion TCA Cream 3. Primary Dressing Applied Iodoflex 4. Secondary Dressing ABD Pad Dry Gauze 6. Support Layer Applied 3 layer compression wrap Wound #3 (Right, Posterior Lower Leg) 2. Periwound Care Moisturizing lotion TCA Cream 3. Primary Dressing Applied Iodoflex 4. Secondary Dressing ABD Pad Dry Gauze 6. Support Layer Applied 3 layer compression Cytogeneticist) Signed: 08/15/2019 6:41:12 PM By: [name] MD Entered By: [name] on 08/15/2019 15:47:01 -------------------------------------------------------------------------------- Multi-Disciplinary Care Plan Details Patient Name: Date of Service: ARTHOR, GORTER 08/15/2019 1:45  PM Medical Record [id number]  Patient Account Number: [id number] Date of Birth/Sex: Treating RN: Oct 17, 1960 (59 y.o. [name] Primary Care Alvaretta Eisenberger: [name] Other Clinician: Referring Jood Retana: Treating Ervin Hensley/Extender:[name], [name], [name]: 2 Active Inactive Nutrition Nursing Diagnoses: Impaired glucose control: actual or potential Potential for alteratiion in Nutrition/Potential for imbalanced nutrition Goals: Patient/caregiver agrees to and verbalizes understanding of need to use nutritional supplements and/or vitamins as prescribed Date Initiated: 08/01/2019 Target Resolution Date: 09/02/2019 Goal Status: Active Patient/caregiver will maintain therapeutic glucose control Date Initiated: 08/01/2019 Target Resolution Date: 09/02/2019 Goal Status: Active Interventions: Assess HgA1c results as ordered upon admission and as needed Assess patient nutrition upon admission and as needed per policy Provide education on elevated blood sugars and impact on wound healing Provide education on nutrition Treatment Activities: Education provided on Nutrition : 08/01/2019 Notes: Venous Leg Ulcer Nursing Diagnoses: Knowledge deficit related to disease process and management Potential for venous Insuffiency (use before diagnosis confirmed) Goals: Patient will maintain optimal edema control Date Initiated: 08/01/2019 Target Resolution Date: 09/02/2019 Goal Status: Active Patient/caregiver will verbalize understanding of disease process and disease management Date Initiated: 08/01/2019 Target Resolution Date: 09/02/2019 Goal Status: Active Interventions: Assess peripheral edema status every visit. Compression as ordered Provide education on venous insufficiency Notes: Wound/Skin Impairment Nursing Diagnoses: Impaired tissue integrity Knowledge deficit related to smoking impact on wound healing Knowledge deficit related to ulceration/compromised skin  integrity Goals: Patient/caregiver will verbalize understanding of skin care regimen Date Initiated: 08/01/2019 Target Resolution Date: 09/02/2019 Goal Status: Active Interventions: Assess patient/caregiver ability to obtain necessary supplies Assess patient/caregiver ability to perform ulcer/skin care regimen upon admission and as needed Assess ulceration(s) every visit Provide education on ulcer and skin care Notes: Electronic Signature(s) Signed: 08/15/2019 6:39:44 PM By: Zandra Abts RN, BSN Entered By: Zandra Abts on 08/15/2019 18:27:10 -------------------------------------------------------------------------------- Pain Assessment Details Patient Name: Date of Service: ELI, PATTILLO 08/15/2019 1:45 PM Medical Record [id number] Patient Account Number: [id number] Date of Birth/Sex: Treating RN: March 12, 1961 (58 y.o. Damaris Schooner Primary Care Ulice Follett: [name] Other Clinician: Referring Lionel Woodberry: Treating Strummer Canipe/Extender:[name], [name], [name]: 2 Active Problems Location of Pain Severity and Description of Pain Patient Has Paino No Site Locations Rate the pain. Current Pain Level: 0 Pain Management and Medication Current Pain Management: Electronic Signature(s) Signed: 08/16/2019 5:43:30 PM By: Zenaida Deed RN, BSN Entered By: Zenaida Deed on 08/15/2019 14:31:42 -------------------------------------------------------------------------------- Patient/Caregiver Education Details Patient Name: Date of Service: QAADIR, KENT 2/1/2021andnbsp1:45 PM Medical Record Number:[id number] Patient Account Number: [id number] Date of Birth/Gender: Treating RN: 1961-01-10 (58 y.o. [name] Primary Care Physician: [name] Other Clinician: Referring Physician: Treating Physician/Extender:[name], [name], [name]: 2 Education Assessment Education Provided  To: Patient Education Topics Provided Nutrition: Methods: Explain/Verbal Responses: State content correctly Venous: Methods: Explain/Verbal Responses: State content correctly Wound/Skin Impairment: Methods: Explain/Verbal Responses: State content correctly Electronic Signature(s) Signed: 08/15/2019 6:39:44 PM By: Zandra Abts RN, BSN Entered By: Zandra Abts on 08/15/2019 18:27:26 -------------------------------------------------------------------------------- Wound Assessment Details Patient Name: Date of Service: LEANTHONY, RHETT 08/15/2019 1:45 PM Medical Record [id number] Patient Account Number: [id number] Date of Birth/Sex: Treating RN: June 13, 1961 (58 y.o. M) Primary Care Shaquana Buel: [name] Other Clinician: Referring Dicie Edelen: Treating Haskel Dewalt/Extender:[name], [name], [name]: 2 Wound Status Wound Number: 1 Primary Venous Leg Ulcer Etiology: Wound Location: Right Lower Leg - Anterior Secondary Diabetic Wound/Ulcer of the Lower Wounding Event: Trauma Etiology: Extremity Date Acquired: 06/14/2019 Wound Open Weeks  Of Treatment: 2 Status: Clustered Wound: No Comorbid Deep Vein Thrombosis, Hypertension, Type History: II Diabetes, Osteoarthritis Photos Wound Measurements Length: (cm) 3.3 % Reduct Width: (cm) 2.7 % Reduct Depth: (cm) 0.2 Epitheli Area: (cm) 6.998 Tunneli Volume: (cm) 1.4 Undermi Wound Description Classification: Full Thickness Without Exposed Support Foul Odo Structures Slough/F Wound Flat and Intact Margin: Exudate Small Amount: Exudate Serosanguineous Type: Exudate red, brown Color: Wound Bed Granulation Amount: Large (67-100%) Granulation Quality: Red Fascia Ex Necrotic Amount: Small (1-33%) Fat Layer Necrotic Quality: Adherent Slough Tendon Ex Muscle Ex Joint Exp Bone Expo r After Cleansing: No ibrino Yes Exposed Structure posed: No (Subcutaneous Tissue) Exposed: Yes posed:  No posed: No osed: No sed: No ion in Area: 14% ion in Volume: 14% alization: None ng: No ning: No Treatment Notes Wound #1 (Right, Anterior Lower Leg) 2. Periwound Care Moisturizing lotion TCA Cream 3. Primary Dressing Applied Iodoflex 4. Secondary Dressing ABD Pad Dry Gauze 6. Support Layer Applied 3 layer compression wrap Electronic Signature(s) Signed: 08/17/2019 4:30:25 PM By: Benjaman KindlerJones, Dedrick EMT/HBOT Previous Signature: 08/16/2019 5:43:30 PM Version By: Zenaida DeedBoehlein, Linda RN, BSN Entered By: Benjaman KindlerJones, Dedrick on 08/17/2019 15:35:28 -------------------------------------------------------------------------------- Wound Assessment Details Patient Name: Date of Service: Jeannette HowHOMAS, Braxtin 08/15/2019 1:45 PM Medical Record [id number] Patient Account Number: [id number] Date of Birth/Sex: Treating RN: [birth year] (58 y.o. M) Primary Care Jayvier Burgher: [name], Wilson Oliver Other Clinician: Referring Gladie Gravette: Treating Keion Neels/Extender:[name], Maeola SarahMichael Elkins, Curly RimWilson Oliver Weeks in Treatment: 2 Wound Status Wound Number: 2 Primary Venous Leg Ulcer Etiology: Wound Location: Right Lower Leg - Lateral Secondary Diabetic Wound/Ulcer of the Lower Wounding Event: Gradually Appeared Etiology: Extremity Date Acquired: 07/11/2019 Wound Open Weeks Of Treatment: 2 Status: Clustered Wound: No Comorbid Deep Vein Thrombosis, Hypertension, Type History: II Diabetes, Osteoarthritis Photos Wound Measurements Length: (cm) 2.1 % Reduct Width: (cm) 1.5 % Reduct Depth: (cm) 0.2 Epitheli Area: (cm) 2.474 Tunneli Volume: (cm) 0.495 Undermi Wound Description Classification: Full Thickness Without Exposed Support Foul Odo Structures Slough/F Wound Flat and Intact Margin: Exudate Small Amount: Exudate Serosanguineous Type: Exudate red, brown Color: Wound Bed Granulation Amount: Medium (34-66%) Granulation Quality: Red Fascia E Necrotic Amount: Medium (34-66%) Fat Laye Necrotic  Quality: Adherent Slough Tendon E Muscle E Joint Ex Bone Exp r After Cleansing: No ibrino Yes Exposed Structure xposed: No r (Subcutaneous Tissue) Exposed: Yes xposed: No xposed: No posed: No osed: No ion in Area: 25% ion in Volume: 50% alization: None ng: No ning: No Treatment Notes Wound #2 (Right, Lateral Lower Leg) 2. Periwound Care Moisturizing lotion TCA Cream 3. Primary Dressing Applied Iodoflex 4. Secondary Dressing ABD Pad Dry Gauze 6. Support Layer Applied 3 layer compression wrap Electronic Signature(s) Signed: 08/17/2019 4:30:25 PM By: Benjaman KindlerJones, Dedrick EMT/HBOT Previous Signature: 08/16/2019 5:43:30 PM Version By: Zenaida DeedBoehlein, Linda RN, BSN Entered By: Benjaman KindlerJones, Dedrick on 08/17/2019 15:35:46 -------------------------------------------------------------------------------- Wound Assessment Details Patient Name: Date of Service: Jeannette HowHOMAS, Finnegan 08/15/2019 1:45 PM Medical Record [id number] Patient Account Number: [id number] Date of Birth/Sex: Treating RN: [birth year] (58 y.o. M) Primary Care Ramona Ruark: [name], Wilson Oliver Other Clinician: Referring Khristopher Kapaun: Treating Janiaya Ryser/Extender:[name], Maeola SarahMichael Elkins, Curly RimWilson Oliver Weeks in Treatment: 2 Wound Status Wound Number: 3 Primary Venous Leg Ulcer Etiology: Wound Location: Right Lower Leg - Posterior Secondary Diabetic Wound/Ulcer of the Lower Wounding Event: Gradually Appeared Etiology: Extremity Date Acquired: 07/11/2019 Wound Open Weeks Of Treatment: 2 Status: Clustered Wound: No Comorbid Deep Vein Thrombosis, Hypertension, Type History: II Diabetes, Osteoarthritis Photos Wound Measurements Length: (cm) 1.4 % Reduct Width: (cm) 1.7 % Reduct Depth: (cm) 0.1  Talmo Area: (cm) 1.869 Tunneli Volume: (cm) 0.187 Undermi Wound Description Classification: Full Thickness Without Exposed Support Foul Odo Structures Slough/F Wound Flat and Intact Margin: Exudate Small Amount: Exudate  Serosanguineous Type: Exudate red, brown Color: Wound Bed Granulation Amount: Small (1-33%) Granulation Quality: Pink Fascia E Necrotic Amount: Large (67-100%) Fat Laye Necrotic Quality: Adherent Slough Tendon E Muscle E Joint E Bone Ex r After Cleansing: No ibrino Yes Exposed Structure xposed: No r (Subcutaneous Tissue) Exposed: Yes xposed: No xposed: No xposed: No posed: No ion in Area: 47.1% ion in Volume: 47% alization: None ng: No ning: No Treatment Notes Wound #3 (Right, Posterior Lower Leg) 2. Periwound Care Moisturizing lotion TCA Cream 3. Primary Dressing Applied Iodoflex 4. Secondary Dressing ABD Pad Dry Gauze 6. Support Layer Applied 3 layer compression wrap Electronic Signature(s) Signed: 08/17/2019 4:30:25 PM By: Mikeal Hawthorne EMT/HBOT Previous Signature: 08/16/2019 5:43:30 PM Version By: Baruch Gouty RN, BSN Entered By: Mikeal Hawthorne on 08/17/2019 15:35:09 -------------------------------------------------------------------------------- Vitals Details Patient Name: Date of Service: KENDERICK, KOBLER 08/15/2019 1:45 PM Medical Record [id number] Patient Account Number: [id number] Date of Birth/Sex: Treating RN: 1960-08-25 (59 y.o. Ernestene Mention Primary Care Armarion Greek: [name] Other Clinician: Referring Patrick Salemi: Treating Koltin Wehmeyer/Extender:[name], [name], [name] Weeks in Treatment: 2 Vital Signs Time Taken: 14:30 Temperature (F): 98.3 Height (in): 70 Pulse (bpm): 66 Source: Stated Respiratory Rate (breaths/min): 20 Weight (lbs): 302 Blood Pressure (mmHg): 138/88 Source: Stated Reference Range: 80 - 120 mg / dl Body Mass Index (BMI): 43.3 Electronic Signature(s) Signed: 08/16/2019 5:43:30 PM By: Baruch Gouty RN, BSN Entered By: Baruch Gouty on 08/15/2019 14:31:31

## 2019-08-22 ENCOUNTER — Other Ambulatory Visit: Payer: Self-pay

## 2019-08-22 ENCOUNTER — Encounter (HOSPITAL_BASED_OUTPATIENT_CLINIC_OR_DEPARTMENT_OTHER): Payer: PPO | Attending: Internal Medicine | Admitting: Internal Medicine

## 2019-08-22 DIAGNOSIS — L97212 Non-pressure chronic ulcer of right calf with fat layer exposed: Secondary | ICD-10-CM | POA: Diagnosis not present

## 2019-08-22 DIAGNOSIS — L97812 Non-pressure chronic ulcer of other part of right lower leg with fat layer exposed: Secondary | ICD-10-CM | POA: Diagnosis not present

## 2019-08-22 DIAGNOSIS — I87311 Chronic venous hypertension (idiopathic) with ulcer of right lower extremity: Secondary | ICD-10-CM | POA: Diagnosis not present

## 2019-08-22 DIAGNOSIS — E11622 Type 2 diabetes mellitus with other skin ulcer: Secondary | ICD-10-CM | POA: Diagnosis not present

## 2019-08-23 NOTE — Progress Notes (Signed)
ZED, WANNINGER ([id number]) Visit Report for 08/22/2019 Debridement Details Patient Name: Date of Service: Dennis Wyatt, Dennis Wyatt 08/22/2019 2:45 PM Medical Record Number:[id number] Patient Account Number: [id number] Date of Birth/Sex: Treating RN: [birth year] (59 y.o. M) Primary Care Provider: [name] Other Clinician: Referring Provider: Treating Provider/Extender:Finneas Mathe, [name], [name]: 3 Debridement Performed for Wound #1 Right,Anterior Lower Leg Assessment: Performed By: Physician Maxwell Caul., MD Debridement Type: Debridement Severity of Tissue Pre Fat layer exposed Debridement: Level of Consciousness (Pre- Awake and Alert procedure): Pre-procedure Verification/Time Out Taken: Yes - 15:50 Start Time: 15:50 Pain Control: Other : Benzocaine 20% Total Area Debrided (L x W): 3.8 (cm) x 2.5 (cm) = 9.5 (cm) Tissue and other material Viable, Non-Viable, Slough, Subcutaneous, Slough debrided: Level: Skin/Subcutaneous Tissue Debridement Description: Excisional Instrument: Curette Bleeding: Minimum Hemostasis Achieved: Pressure End Time: 15:51 Procedural Pain: 0 Post Procedural Pain: 0 Response to Treatment: Procedure was tolerated well Level of Consciousness Awake and Alert (Post-procedure): Post Debridement Measurements of Total Wound Length: (cm) 3.8 Width: (cm) 2.5 Depth: (cm) 0.2 Volume: (cm) 1.492 Character of Wound/Ulcer Post Improved Debridement: Severity of Tissue Post Debridement: Fat layer exposed Post Procedure Diagnosis Same as Pre-procedure Electronic Signature(s) Signed: 08/22/2019 6:01:37 PM By: [name] MD Entered By: [name] on 08/22/2019 17:45:36 -------------------------------------------------------------------------------- Debridement Details Patient Name: Date of Service: Dennis Wyatt, Dennis Wyatt 08/22/2019 2:45 PM Medical Record [id number] Patient Account Number: [id number] Date of  Birth/Sex: Treating RN: [birth year] (58 y.o. M) Primary Care Provider: [name] Other Clinician: Referring Provider: Treating Provider/Extender:Khaila Velarde, [name], [name]: 3 Debridement Performed for Wound #2 Right,Lateral Lower Leg Assessment: Performed By: Physician Maxwell Caul., MD Debridement Type: Debridement Severity of Tissue Pre Fat layer exposed Debridement: Level of Consciousness (Pre- Awake and Alert procedure): Pre-procedure Yes - 15:50 Verification/Time Out Taken: Start Time: 15:50 Pain Control: Other : Benzocaine 20% Total Area Debrided (L x W): 2 (cm) x 1.6 (cm) = 3.2 (cm) Tissue and other material Viable, Non-Viable, Slough, Subcutaneous, Slough debrided: Level: Skin/Subcutaneous Tissue Debridement Description: Excisional Instrument: Curette Bleeding: Minimum Hemostasis Achieved: Pressure End Time: 15:51 Procedural Pain: 0 Post Procedural Pain: 0 Response to Treatment: Procedure was tolerated well Level of Consciousness Awake and Alert (Post-procedure): Post Debridement Measurements of Total Wound Length: (cm) 2 Width: (cm) 1.6 Depth: (cm) 0.2 Volume: (cm) 0.503 Character of Wound/Ulcer Post Improved Debridement: Severity of Tissue Post Debridement: Fat layer exposed Post Procedure Diagnosis Same as Pre-procedure Electronic Signature(s) Signed: 08/22/2019 6:01:37 PM By: [name] MD Entered By: [name] on 08/22/2019 17:45:44 -------------------------------------------------------------------------------- Debridement Details Patient Name: Date of Service: Dennis Wyatt, Dennis Wyatt 08/22/2019 2:45 PM Medical Record [id number] Patient Account Number: [id number] Date of Birth/Sex: Treating RN: Dec 20, 1960 (58 y.o. M) Primary Care Provider: [name] Other Clinician: Referring Provider: Treating Provider/Extender:Suzanne Garbers, [name], [name]:  3 Debridement Performed for Wound #3 Right,Posterior Lower Leg Assessment: Performed By: Physician Maxwell Caul., MD Debridement Type: Debridement Severity of Tissue Pre Fat layer exposed Debridement: Level of Consciousness (Pre- Awake and Alert procedure): Pre-procedure Yes - 15:50 Verification/Time Out Taken: Start Time: 15:50 Pain Control: Other : Benzocaine 20% Total Area Debrided (L x W): 0.8 (cm) x 1.7 (cm) = 1.36 (cm) Tissue and other material Viable, Non-Viable, Slough, Subcutaneous, Slough debrided: Level: Skin/Subcutaneous Tissue Debridement Description: Excisional Instrument: Curette Bleeding: Minimum Hemostasis Achieved: Pressure End Time: 15:51 Procedural Pain: 0 Post Procedural Pain: 0 Response to Treatment: Procedure was tolerated well Level of Consciousness Awake and Alert (  Post-procedure): Post Debridement Measurements of Total Wound Length: (cm) 0.8 Width: (cm) 1.7 Depth: (cm) 0.2 Volume: (cm) 0.214 Character of Wound/Ulcer Post Improved Debridement: Severity of Tissue Post Debridement: Fat layer exposed Post Procedure Diagnosis Same as Pre-procedure Electronic Signature(s) Signed: 08/22/2019 6:01:37 PM By: [name] MD Entered By: [name] on 08/22/2019 17:45:54 -------------------------------------------------------------------------------- HPI Details Patient Name: Date of Service: Dennis Wyatt, Dennis Wyatt 08/22/2019 2:45 PM Medical Record [id number] Patient Account Number: [id number] Date of Birth/Sex: Treating RN: [birth year] (58 y.o. M) Primary Care Provider: [name] Other Clinician: Referring Provider: Treating Provider/Extender:Brytney Somes, [name], [name]: 3 History of Present Illness HPI Description: ADMISSION 08/01/2019 This is a 59 year old man who is essentially self-referred although he gets his primary care at [facility]. We did not have any information from them.  He is here for review of 3 wounds on the right lower leg. He states about a month and a half ago he dropped his cell phone on the right anterior leg creating a wound. More recently he has had 2 blisters come out 1 medially just above the lateral malleolus and one on the posterior calf. All of these are roughly the same condition however. He has been applying some form of ointment were not really sure what this is. He does not have a wound history. 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 ABI in our clinic was 0.95 1/25; the patient has 3 wounds on the right leg. One is on the right anterior 1 just above the lateral malleolus and one on the posterior calf. We have been using Iodoflex under compression. The base of these wounds appears better 2/1; still the 3 wounds one on the right anterior 1 on the lateral and 1 on the posterior calf we have been using Iodoflex under compression making some general improvement in the wound bed. The patient went for his reflux studies on 1/28. This showed reflux in the common femoral vein and greater than 1 second in the greater saphenous vein at the saphenofemoral junction but nothing in the more superficial veins below the knee either greater or lesser saphenous. I therefore I do not think he is going to be M amenable for an ablation. The report did note an aging consistent deep venous thrombosis involving the right popliteal artery. This led me to research [place] link. He had a similar finding on a an arterial duplex study in 2014 at which time he was recovering from a subarachnoid hemorrhage. This was seen by the rehab team who admitted him to rehab after the surgery for an anterior cerebral aneurysm. By that time it was felt safe to anticoagulate him and he was  put on Xarelto and continued that according to the patient to the last year or 2 when it was stopped and he is only on aspirin. 2/8; all of his wounds appear to be making some progress. We have been using Iodoflex. Base of the wounds appear to be cleaning up nicely but still requiring debridement. We have tried to put Apligraf through his insurance related to deep punched out chronic venous insufficiency wounds and a type II diabetic. We will see if this is affordable Electronic Signature(s) Signed: 08/22/2019 6:01:37 PM By: [name] MD Entered By: [name] on 08/22/2019 17:46:42 -------------------------------------------------------------------------------- Physical Exam Details Patient Name: Date of Service: Dennis Wyatt, Dennis Wyatt 08/22/2019 2:45  PM Medical Record [id number] Patient Account Number: [id number] Date of Birth/Sex: Treating RN: [birth year] (59 y.o. M) Primary Care Provider: [name] Other Clinician: Referring Provider: Treating Provider/Extender:Ashish Rossetti, [name], [name]: 3 Constitutional Sitting or standing Blood Pressure is within target range for patient.. Pulse regular and within target range for patient.Marland Kitchen Respirations regular, non-labored and within target range.. Temperature is normal and within the target range for the patient.Marland Kitchen Appears in no distress. Notes Wound exam Major wound on the lower leg and tibia. There is also the area on the posterior calf. Generally improvement in the wound bed. Still using a #5 curette to remove skin and subcutaneous tissue from the wound margins. Things seem to be improving in terms of how these wounds look. Electronic Signature(s) Signed: 08/22/2019 6:01:37 PM By: [name] MD Entered By: [name] on 08/22/2019 17:48:02 -------------------------------------------------------------------------------- Physician Orders Details Patient Name: Date of Service: Dennis Wyatt, Dennis Wyatt 08/22/2019 2:45 PM Medical Record [id number] Patient Account Number: [id number] Date of Birth/Sex: Treating RN: 14-Oct-1960 (58 y.o. [name] Primary Care Provider: [name] Other Clinician: Referring Provider: Treating Provider/Extender:Christoher Drudge, [name], [name]: 3 Verbal / Phone Orders: No Diagnosis Coding ICD-10 Coding Code Description [phone] Non-pressure chronic ulcer of other part of right lower leg with fat layer exposed I87.331 Chronic venous hypertension (idiopathic) with ulcer and inflammation of right lower extremity E11.622 Type 2 diabetes mellitus with other skin ulcer E11.42 Type 2 diabetes mellitus with diabetic polyneuropathy Follow-up Appointments Return Appointment in 1 week. Dressing Change Frequency Do not change entire dressing for one week. Skin Barriers/Peri-Wound Care Moisturizing lotion TCA Cream or Ointment - mixed with lotion Wound Cleansing May shower with protection. - may use cast protector Primary Wound Dressing Wound #1 Right,Anterior Lower Leg Iodoflex Wound #2 Right,Lateral Lower Leg Iodoflex Wound #3 Right,Posterior Lower Leg Iodoflex Secondary Dressing Wound #1 Right,Anterior Lower Leg ABD pad Zetuvit or Kerramax Wound #2 Right,Lateral Lower Leg ABD pad Zetuvit or Kerramax Wound #3 Right,Posterior Lower Leg ABD pad Zetuvit or Kerramax Edema Control 3 Layer Compression System - Right Lower Extremity Avoid standing for long periods of time Elevate legs to the level of the heart or above for 30 minutes daily and/or when sitting, a frequency of: - throughout the day Exercise regularly Electronic Signature(s) Signed: 08/22/2019 6:01:37 PM By: [name] MD Signed: 08/23/2019 5:30:42 PM By: Zandra Abts RN, BSN Entered By: Zandra Abts on 08/22/2019 15:50:22 -------------------------------------------------------------------------------- Problem List Details Patient  Name: Date of Service: Dennis Wyatt, Dennis Wyatt 08/22/2019 2:45 PM Medical Record [id number] Patient Account Number: [id number] Date of Birth/Sex: Treating RN: October 09, 1960 (58 y.o. [name] Primary Care Provider: [name] Other Clinician: Referring Provider: Treating Provider/Extender:Frady Taddeo, [name], [name]: 3 Active Problems ICD-10 Evaluated Encounter Code Description Active Date Today Diagnosis [phone] Non-pressure chronic ulcer of other part of right lower 08/01/2019 No Yes leg with fat layer exposed I87.331 Chronic venous hypertension (idiopathic) with ulcer 08/01/2019 No Yes and inflammation of right lower extremity E11.622 Type 2 diabetes mellitus with other skin ulcer 08/01/2019 No Yes E11.42 Type 2 diabetes mellitus with diabetic polyneuropathy 08/01/2019 No Yes Inactive Problems Resolved Problems Electronic Signature(s) Signed: 08/22/2019 6:01:37 PM By: [name] MD Entered By: [name] on 08/22/2019 17:45:23 -------------------------------------------------------------------------------- Progress Note Details Patient Name: Date of Service: Dennis Wyatt, Dennis Wyatt 08/22/2019 2:45 PM Medical Record [id number] Patient Account Number: [id number] Date of Birth/Sex: Treating RN: 1960/10/10 (58 y.o. M) Primary Care Provider: [name]  Other Clinician: Referring Provider: Treating Provider/Extender:Indra Wolters, [name], [name] Weeks in Treatment: 3 Subjective History of Present Illness (HPI) ADMISSION 08/01/2019 This is a 59 year old man who is essentially self-referred although he gets his primary care at [facility]. We did not have any information from them. He is here for review of 3 wounds on the right lower leg. He states about a month and a half ago he dropped his cell phone on the right anterior leg creating a wound. More recently he has had 2 blisters come out 1 medially just above  the lateral malleolus and one on the posterior calf. All of these are roughly the same condition however. He has been applying some form of ointment were not really sure what this is. He does not have a wound history. 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 ABI in our clinic was 0.95 1/25; the patient has 3 wounds on the right leg. One is on the right anterior 1 just above the lateral malleolus and one on the posterior calf. We have been using Iodoflex under compression. The base of these wounds appears better 2/1; still the 3 wounds one on the right anterior 1 on the lateral and 1 on the posterior calf we have been using Iodoflex under compression making some general improvement in the wound bed. The patient went for his reflux studies on 1/28. This showed reflux in the common femoral vein and greater than 1 second in the greater saphenous vein at the saphenofemoral junction but nothing in the more superficial veins below the knee either greater or lesser saphenous. I therefore I do not think he is going to be M amenable for an ablation. The report did note an aging consistent deep venous thrombosis involving the right popliteal artery. This led me to research [place] link. He had a similar finding on a an arterial duplex study in 2014 at which time he was recovering from a subarachnoid hemorrhage. This was seen by the rehab team who admitted him to rehab after the surgery for an anterior cerebral aneurysm. By that time it was felt safe to anticoagulate him and he was put on Xarelto and continued that according to the patient to the last year or 2 when it was stopped and he is only on aspirin. 2/8; all of his wounds appear to be making some progress. We have been using Iodoflex. Base of the  wounds appear to be cleaning up nicely but still requiring debridement. We have tried to put Apligraf through his insurance related to deep punched out chronic venous insufficiency wounds and a type II diabetic. We will see if this is affordable Objective Constitutional Sitting or standing Blood Pressure is within target range for patient.. Pulse regular and within target range for patient.Marland Kitchen Respirations regular, non-labored and within target range.. Temperature is normal and within the target range for the patient.Marland Kitchen Appears in no distress. Vitals Time Taken: 2:44 PM, Height: 70 in, Weight: 302 lbs, BMI: 43.3, Temperature: 98.5 F, Pulse: 71 bpm, Respiratory Rate: 18 breaths/min, Blood Pressure: 116/60 mmHg. General Notes: Wound exam ooMajor wound on the lower leg and tibia. There is also the area on the posterior calf. Generally improvement in the wound bed. Still using a #5 curette to remove skin and subcutaneous tissue from the wound margins. Things seem to be improving in terms of how  these wounds look. Integumentary (Hair, Skin) Wound #1 status is Open. Original cause of wound was Trauma. The wound is located on the Right,Anterior Lower Leg. The wound measures 3.8cm length x 2.5cm width x 0.2cm depth; 7.461cm^2 area and 1.492cm^3 volume. There is Fat Layer (Subcutaneous Tissue) Exposed exposed. There is no tunneling or undermining noted. There is a small amount of serosanguineous drainage noted. The wound margin is flat and intact. There is large (67-100%) red granulation within the wound bed. There is a small (1-33%) amount of necrotic tissue within the wound bed including Adherent Slough. Wound #2 status is Open. Original cause of wound was Gradually Appeared. The wound is located on the Right,Lateral Lower Leg. The wound measures 2cm length x 1.6cm width x 0.2cm depth; 2.513cm^2 area and 0.503cm^3 volume. There is Fat Layer (Subcutaneous Tissue) Exposed exposed. There is no tunneling  or undermining noted. There is a small amount of serosanguineous drainage noted. The wound margin is flat and intact. There is medium (34-66%) red granulation within the wound bed. There is a medium (34-66%) amount of necrotic tissue within the wound bed including Adherent Slough. Wound #3 status is Open. Original cause of wound was Gradually Appeared. The wound is located on the Right,Posterior Lower Leg. The wound measures 0.8cm length x 1.7cm width x 0.2cm depth; 1.068cm^2 area and 0.214cm^3 volume. There is Fat Layer (Subcutaneous Tissue) Exposed exposed. There is no tunneling or undermining noted. There is a small amount of serosanguineous drainage noted. The wound margin is flat and intact. There is small (1-33%) pink granulation within the wound bed. There is a large (67-100%) amount of necrotic tissue within the wound bed including Adherent Slough. Assessment Active Problems ICD-10 Non-pressure chronic ulcer of other part of right lower leg with fat layer exposed Chronic venous hypertension (idiopathic) with ulcer and inflammation of right lower extremity Type 2 diabetes mellitus with other skin ulcer Type 2 diabetes mellitus with diabetic polyneuropathy Procedures Wound #1 Pre-procedure diagnosis of Wound #1 is a Venous Leg Ulcer located on the Right,Anterior Lower Leg .Severity of Tissue Pre Debridement is: Fat layer exposed. There was a Excisional Skin/Subcutaneous Tissue Debridement with a total area of 9.5 sq cm performed by Maxwell Caulobson, Niall Illes G., MD. With the following instrument(s): Curette to remove Viable and Non-Viable tissue/material. Material removed includes Subcutaneous Tissue and Slough and after achieving pain control using Other (Benzocaine 20%). No specimens were taken. A time out was conducted at 15:50, prior to the start of the procedure. A Minimum amount of bleeding was controlled with Pressure. The procedure was tolerated well with a pain level of 0 throughout and  a pain level of 0 following the procedure. Post Debridement Measurements: 3.8cm length x 2.5cm width x 0.2cm depth; 1.492cm^3 volume. Character of Wound/Ulcer Post Debridement is improved. Severity of Tissue Post Debridement is: Fat layer exposed. Post procedure Diagnosis Wound #1: Same as Pre-Procedure Pre-procedure diagnosis of Wound #1 is a Venous Leg Ulcer located on the Right,Anterior Lower Leg . There was a Three Layer Compression Therapy Procedure by Zandra AbtsLynch, Shatara, RN. Post procedure Diagnosis Wound #1: Same as Pre-Procedure Wound #2 Pre-procedure diagnosis of Wound #2 is a Venous Leg Ulcer located on the Right,Lateral Lower Leg .Severity of Tissue Pre Debridement is: Fat layer exposed. There was a Excisional Skin/Subcutaneous Tissue Debridement with a total area of 3.2 sq cm performed by Maxwell Caulobson, Shadae Reino G., MD. With the following instrument(s): Curette to remove Viable and Non-Viable tissue/material. Material removed includes Subcutaneous Tissue and Slough and after  achieving pain control using Other (Benzocaine 20%). No specimens were taken. A time out was conducted at 15:50, prior to the start of the procedure. A Minimum amount of bleeding was controlled with Pressure. The procedure was tolerated well with a pain level of 0 throughout and a pain level of 0 following the procedure. Post Debridement Measurements: 2cm length x 1.6cm width x 0.2cm depth; 0.503cm^3 volume. Character of Wound/Ulcer Post Debridement is improved. Severity of Tissue Post Debridement is: Fat layer exposed. Post procedure Diagnosis Wound #2: Same as Pre-Procedure Pre-procedure diagnosis of Wound #2 is a Venous Leg Ulcer located on the Right,Lateral Lower Leg . There was a Three Layer Compression Therapy Procedure by Zandra AbtsLynch, Shatara, RN. Post procedure Diagnosis Wound #2: Same as Pre-Procedure Wound #3 Pre-procedure diagnosis of Wound #3 is a Venous Leg Ulcer located on the Right,Posterior Lower Leg .Severity  of Tissue Pre Debridement is: Fat layer exposed. There was a Excisional Skin/Subcutaneous Tissue Debridement with a total area of 1.36 sq cm performed by Maxwell Caulobson, Sharena Dibenedetto G., MD. With the following instrument(s): Curette to remove Viable and Non-Viable tissue/material. Material removed includes Subcutaneous Tissue and Slough and after achieving pain control using Other (Benzocaine 20%). No specimens were taken. A time out was conducted at 15:50, prior to the start of the procedure. A Minimum amount of bleeding was controlled with Pressure. The procedure was tolerated well with a pain level of 0 throughout and a pain level of 0 following the procedure. Post Debridement Measurements: 0.8cm length x 1.7cm width x 0.2cm depth; 0.214cm^3 volume. Character of Wound/Ulcer Post Debridement is improved. Severity of Tissue Post Debridement is: Fat layer exposed. Post procedure Diagnosis Wound #3: Same as Pre-Procedure Pre-procedure diagnosis of Wound #3 is a Venous Leg Ulcer located on the Right,Posterior Lower Leg . There was a Three Layer Compression Therapy Procedure by Zandra AbtsLynch, Shatara, RN. Post procedure Diagnosis Wound #3: Same as Pre-Procedure Plan Disc Follow-up Appointments: Return Appointment in 1 week. Dressing Change Frequency: Do not change entire dressing for one week. Skin Barriers/Peri-Wound Care: Moisturizing lotion TCA Cream or Ointment - mixed with lotion Wound Cleansing: May shower with protection. - may use cast protector Primary Wound Dressing: Wound #1 Right,Anterior Lower Leg: Iodoflex Wound #2 Right,Lateral Lower Leg: Iodoflex Wound #3 Right,Posterior Lower Leg: Iodoflex Secondary Dressing: Wound #1 Right,Anterior Lower Leg: ABD pad Zetuvit or Kerramax Wound #2 Right,Lateral Lower Leg: ABD pad Zetuvit or Kerramax Wound #3 Right,Posterior Lower Leg: ABD pad Zetuvit or Kerramax Edema Control: 3 Layer Compression System - Right Lower Extremity Avoid standing for  long periods of time Elevate legs to the level of the heart or above for 30 minutes daily and/or when sitting, a frequency of: - throughout the day Exercise regularly 1. I am going to continue Iodoflex for another week. Hopefully I could obtain Apligraf if this is affordable through his insurance 2. Otherwise we will probably move to endoform or Prisma Electronic Signature(s) Signed: 08/22/2019 6:01:37 PM By: Baltazar Najjarobson, Arlie Riker MD Entered By: Baltazar Najjarobson, Eustolia Drennen on 08/22/2019 17:48:46 -------------------------------------------------------------------------------- SuperBill Details Patient Name: Date of Service: Jeannette HowHOMAS, Dennis Wyatt 08/22/2019 Medical Record Number:[id number] Patient Account Number: [id number] Date of Birth/Sex: Treating RN: 1961/06/28 (58 y.o. M) Primary Care Provider: [name], Wilson Oliver Other Clinician: Referring Provider: Treating Provider/Extender:Azalea Cedar, Maeola SarahMichael Elkins, Curly RimWilson Oliver Weeks in Treatment: 3 Diagnosis Coding ICD-10 Codes Code Description [phone] Non-pressure chronic ulcer of other part of right lower leg with fat layer exposed I87.331 Chronic venous hypertension (idiopathic) with ulcer and inflammation of right lower extremity E11.622 Type  2 diabetes mellitus with other skin ulcer E11.42 Type 2 diabetes mellitus with diabetic polyneuropathy Facility Procedures CPT4 Code Description: [id number] 11042 - DEB SUBQ TISSUE 20 SQ CM/< ICD-10 Diagnosis Description L97.812 Non-pressure chronic ulcer of other part of right lower leg with I87.331 Chronic venous hypertension (idiopathic) with ulcer and inflamma lower  extremity Modifier: fat layer expo tion of right Quantity: 1 sed Physician Procedures CPT4: Code [id number] 11 Description: 042 - WC PHYS SUBQ TISS 20 SQ CM ICD-10 Diagnosis Description L97.812 Non-pressure chronic ulcer of other part of right lower leg with I87.331 Chronic venous hypertension (idiopathic) with ulcer and inflamma extremity Modifier: fat layer  expos tion of right lo Quantity: 1 ed wer Electronic Signature(s) Signed: 08/22/2019 6:01:37 PM By: [name] MD Entered By: [name] on 08/22/2019 17:49:03

## 2019-08-23 NOTE — Progress Notes (Addendum)
Dennis Wyatt, Dennis Wyatt ([id number]) Visit Report for 08/22/2019 Arrival Information Details Patient Name: Date of Service: Dennis Wyatt, Dennis Wyatt 08/22/2019 2:45 PM Medical Record [id number] Patient Account Number: [id number] Date of Birth/Sex: Treating RN: 1961/02/10 (58 y.o. Jerilynn Mages) Carlene Coria Primary Care Gyanna Jarema: [name] Other Clinician: Referring Rubi Tooley: Treating Yao Hyppolite/Extender:[name], [name], [name] Weeks in Treatment: 3 Visit Information History Since Last Visit All ordered tests and consults were completed: No Patient Arrived: Ambulatory Added or deleted any medications: No Arrival Time: 14:43 Any new allergies or adverse reactions: No Accompanied By: self Had a fall or experienced change in No Transfer Assistance: None activities of daily living that may affect Patient Identification Verified: Yes risk of falls: Secondary Verification Process Completed: Yes Signs or symptoms of abuse/neglect since last No Patient Requires Transmission-Based No visito Precautions: Hospitalized since last visit: No Patient Has Alerts: No Implantable device outside of the clinic excluding No cellular tissue based products placed in the center since last visit: Has Dressing in Place as Prescribed: Yes Has Compression in Place as Prescribed: Yes Pain Present Now: No Electronic Signature(s) Signed: 08/22/2019 5:29:28 PM By: Carlene Coria RN Entered By: Carlene Coria on 08/22/2019 14:44:06 -------------------------------------------------------------------------------- Compression Therapy Details Patient Name: Date of Service: Dennis Wyatt, Dennis Wyatt 08/22/2019 2:45 PM Medical Record [id number] Patient Account Number: [id number] Date of Birth/Sex: Treating RN: 03-22-1961 (59 y.o. [name] Primary Care Jamya Starry: [name] Other Clinician: Referring Harue Pribble: Treating Shalaina Guardiola/Extender:[name], [name], [name] Weeks in Treatment:  3 Compression Therapy Performed for Wound Wound #1 Right,Anterior Lower Leg Assessment: Performed By: Clinician [name], RN Compression Type: Three Layer Post Procedure Diagnosis Same as Pre-procedure Electronic Signature(s) Signed: 08/23/2019 5:30:42 PM By: [name] RN, BSN Entered By: [name] on 08/22/2019 15:54:29 -------------------------------------------------------------------------------- Compression Therapy Details Patient Name: Date of Service: Dennis Wyatt, Dennis Wyatt 08/22/2019 2:45 PM Medical Record [id number] Patient Account Number: [id number] Date of Birth/Sex: Treating RN: 01/05/1961 (59 y.o. [name] Primary Care Tarvis Blossom: [name] Other Clinician: Referring Atziri Zubiate: Treating Ymani Porcher/Extender:[name], [name], [name] Weeks in Treatment: 3 Compression Therapy Performed for Wound Wound #2 Right,Lateral Lower Leg Assessment: Performed By: Clinician [name], RN Compression Type: Three Layer Post Procedure Diagnosis Same as Pre-procedure Electronic Signature(s) Signed: 08/23/2019 5:30:42 PM By: [name] RN, BSN Entered By: [name] on 08/22/2019 15:54:29 -------------------------------------------------------------------------------- Compression Therapy Details Patient Name: Date of Service: Dennis Wyatt, Dennis Wyatt 08/22/2019 2:45 PM Medical Record [id number] Patient Account Number: [id number] Date of Birth/Sex: Treating RN: [birth year] (59 y.o. [name] Primary Care Keirsten Matuska: [name] Other Clinician: Referring Mitchell Epling: Treating Steward Sames/Extender:[name], [name], [name] Weeks in Treatment: 3 Compression Therapy Performed for Wound Wound #3 Right,Posterior Lower Leg Assessment: Performed By: Clinician [name], RN Compression Type: Three Layer Post Procedure Diagnosis Same as Pre-procedure Electronic Signature(s) Signed: 08/23/2019 5:30:42 PM By: [name] RN, BSN Entered By: [name] on 08/22/2019 15:54:29 -------------------------------------------------------------------------------- Encounter Discharge Information Details Patient Name: Date of Service: Dennis Wyatt, Dennis Wyatt 08/22/2019 2:45 PM Medical Record [id number] Patient Account Number: [id number] Date of Birth/Sex: Treating RN: 1961-06-06 (58 y.o. [name] Primary Care Bronwyn Belasco: [name] Other Clinician: Referring Rut Betterton: Treating Leveon Pelzer/Extender:[name], [name], [name] Weeks in Treatment: 3 Encounter Discharge Information Items Post Procedure Vitals Discharge Condition: Stable Temperature (F): 98.5 Ambulatory Status: Cane Pulse (bpm): 71 Discharge Destination: Home Respiratory Rate (breaths/min): 18 Transportation: Private Auto Blood Pressure (mmHg): 116/60 Accompanied By: self Schedule Follow-up Appointment: Yes Clinical Summary of Care: Electronic Signature(s) Signed: 08/22/2019 5:47:02 PM By: [name]  Entered By: Shawn Stall on 08/22/2019 16:21:51 -------------------------------------------------------------------------------- Lower Extremity Assessment Details Patient Name: Date of Service: Dennis Wyatt, Dennis Wyatt 08/22/2019 2:45 PM Medical Record Number:[id number] Patient Account Number: [id number] Date of Birth/Sex: Treating RN: February 26, 1961 (58 y.o. Judie Petit) Yevonne Pax Primary Care Karmyn Lowman: [name] Other Clinician: Referring Kylar Speelman: Treating Cailin Gebel/Extender:[name], [name], [name]: 3 Edema Assessment Assessed: [Left: No] [Right: No] Edema: [Left: Ye] [Right: s] Calf Left: Right: Point of Measurement: 36 cm From Medial Instep cm 39 cm Ankle Left: Right: Point of Measurement: 9 cm From Medial Instep cm 27 cm Electronic Signature(s) Signed: 08/22/2019 5:29:28 PM By: Yevonne Pax RN Entered By: Yevonne Pax on 08/22/2019  14:56:29 -------------------------------------------------------------------------------- Multi Wound Chart Details Patient Name: Date of Service: Dennis Wyatt, Dennis Wyatt 08/22/2019 2:45 PM Medical Record [id number] Patient Account Number: [id number] Date of Birth/Sex: Treating RN: 1960-09-29 (58 y.o. M) Primary Care Marico Buckle: [name] Other Clinician: Referring Avi Kerschner: Treating Amairani Shuey/Extender:[name], [name], [name]: 3 Vital Signs Height(in): 70 Pulse(bpm): 71 Weight(lbs): 302 Blood Pressure(mmHg): 116/60 Body Mass Index(BMI): 43 Temperature(F): 98.5 Respiratory 18 Rate(breaths/min): Photos: [1:No Photos] [2:No Photos] [3:No Photos] Wound Location: [1:Right Lower Leg - Anterior Right Lower Leg - Lateral Right Lower Leg - Posterior] Wounding Event: [1:Trauma] [2:Gradually Appeared] [3:Gradually Appeared] Primary Etiology: [1:Venous Leg Ulcer] [2:Venous Leg Ulcer] [3:Venous Leg Ulcer] Secondary Etiology: [1:Diabetic Wound/Ulcer of the Diabetic Wound/Ulcer of the Diabetic Wound/Ulcer of the Lower Extremity] [2:Lower Extremity] [3:Lower Extremity] Comorbid History: [1:Deep Vein Thrombosis, Hypertension, Type II Diabetes, Osteoarthritis] [2:Deep Vein Thrombosis, Hypertension, Type II Diabetes, Osteoarthritis] [3:Deep Vein Thrombosis, Hypertension, Type II Diabetes, Osteoarthritis] Date Acquired: [1:06/14/2019] [2:07/11/2019] [3:07/11/2019] Weeks of Treatment: [1:3] [2:3] [3:3] Wound Status: [1:Open] [2:Open] [3:Open] Measurements L x W x D 3.8x2.5x0.2 [2:2x1.6x0.2] [3:0.8x1.7x0.2] (cm) Area (cm) : [1:7.461] [2:2.513] [3:1.068] Volume (cm) : [1:1.492] [2:0.503] [3:0.214] % Reduction in Area: [1:8.30%] [2:23.80%] [3:69.80%] % Reduction in Volume: 8.30% [2:49.20%] [3:39.40%] Classification: [1:Full Thickness Without Exposed Support Structures Exposed Support Structures Exposed Support Structures] [2:Full Thickness Without] [3:Full  Thickness Without] Exudate Amount: [1:Small] [2:Small] [3:Small] Exudate Type: [1:Serosanguineous] [2:Serosanguineous] [3:Serosanguineous] Exudate Color: [1:red, brown] [2:red, brown] [3:red, brown] Wound Margin: [1:Flat and Intact] [2:Flat and Intact] [3:Flat and Intact] Granulation Amount: [1:Large (67-100%)] [2:Medium (34-66%)] [3:Small (1-33%)] Granulation Quality: [1:Red] [2:Red] [3:Pink] Necrotic Amount: [1:Small (1-33%)] [2:Medium (34-66%)] [3:Large (67-100%)] Exposed Structures: [1:Fat Layer (Subcutaneous Tissue) Exposed: Yes Fascia: No Tendon: No Muscle: No Joint: No Bone: No] [2:Fat Layer (Subcutaneous Tissue) Exposed: Yes Fascia: No Tendon: No Muscle: No Joint: No Bone: No] [3:Fat Layer (Subcutaneous Tissue) Exposed: Yes  Fascia: No Tendon: No Muscle: No Joint: No Bone: No] Epithelialization: [1:None] [2:None] [3:None] Debridement: [1:Debridement - Excisional] [2:Debridement - Excisional] [3:Debridement - Excisional] Pre-procedure [1:15:50] [2:15:50] [3:15:50] Verification/Time Out Taken: Pain Control: [1:Other] [2:Other] [3:Other] Tissue Debrided: [1:Subcutaneous, Slough] [2:Subcutaneous, Slough] [3:Subcutaneous, Slough] Level: [1:Skin/Subcutaneous Tissue] [2:Skin/Subcutaneous Tissue] [3:Skin/Subcutaneous Tissue] Debridement Area (sq cm):9.5 [2:3.2] [3:1.36] Instrument: [1:Curette] [2:Curette] [3:Curette] Bleeding: [1:Minimum] [2:Minimum] [3:Minimum] Hemostasis Achieved: [1:Pressure] [2:Pressure] [3:Pressure] Procedural Pain: [1:0] [2:0] [3:0] Post Procedural Pain: [1:0] [2:0] [3:0] Debridement Treatment Procedure was tolerated [2:Procedure was tolerated] [3:Procedure was tolerated] Response: [1:well] [2:well] [3:well] Post Debridement [1:3.8x2.5x0.2] [2:2x1.6x0.2] [3:0.8x1.7x0.2] Measurements L x W x D (cm) Post Debridement [1:1.492] [2:0.503] [3:0.214] Volume: (cm) Procedures Performed: Compression Therapy [1:Debridement] [2:Compression Therapy Debridement]  [3:Compression Therapy Debridement] Treatment Notes Wound #1 (Right, Anterior Lower Leg) 1. Cleanse With Wound Cleanser Soap and water 2. Periwound Care Moisturizing lotion TCA Cream 3. Primary Dressing Applied Iodoflex 4. Secondary Dressing ABD Pad  Kerramax/Xtrasorb 6. Support Layer Applied 3 layer compression wrap Notes netting. Wound #2 (Right, Lateral Lower Leg) 1. Cleanse With Wound Cleanser Soap and water 2. Periwound Care Moisturizing lotion TCA Cream 3. Primary Dressing Applied Iodoflex 4. Secondary Dressing ABD Pad Kerramax/Xtrasorb 6. Support Layer Applied 3 layer compression wrap Notes netting. Wound #3 (Right, Posterior Lower Leg) 1. Cleanse With Wound Cleanser Soap and water 2. Periwound Care Moisturizing lotion TCA Cream 3. Primary Dressing Applied Iodoflex 4. Secondary Dressing ABD Pad Kerramax/Xtrasorb 6. Support Layer Applied 3 layer compression wrap Notes netting. Electronic Signature(s) Signed: 08/22/2019 6:01:37 PM By: [name] MD Entered By: [name] on 08/22/2019 17:45:29 -------------------------------------------------------------------------------- Multi-Disciplinary Care Plan Details Patient Name: Date of Service: Dennis Wyatt, Dennis Wyatt 08/22/2019 2:45 PM Medical Record [id number] Patient Account Number: [id number] Date of Birth/Sex: Treating RN: 1961/01/15 (58 y.o. [name] Primary Care Neliah Cuyler: [name] Other Clinician: Referring Chery Giusto: Treating Gabbriella Presswood/Extender:[name], [name], [name]: 3 Active Inactive Nutrition Nursing Diagnoses: Impaired glucose control: actual or potential Potential for alteratiion in Nutrition/Potential for imbalanced nutrition Goals: Patient/caregiver agrees to and verbalizes understanding of need to use nutritional supplements and/or vitamins as prescribed Date Initiated: 08/01/2019 Target Resolution Date: 09/02/2019 Goal  Status: Active Patient/caregiver will maintain therapeutic glucose control Date Initiated: 08/01/2019 Target Resolution Date: 09/02/2019 Goal Status: Active Interventions: Assess HgA1c results as ordered upon admission and as needed Assess patient nutrition upon admission and as needed per policy Provide education on elevated blood sugars and impact on wound healing Provide education on nutrition Treatment Activities: Education provided on Nutrition : 08/15/2019 Notes: Venous Leg Ulcer Nursing Diagnoses: Knowledge deficit related to disease process and management Potential for venous Insuffiency (use before diagnosis confirmed) Goals: Patient will maintain optimal edema control Date Initiated: 08/01/2019 Target Resolution Date: 09/02/2019 Goal Status: Active Patient/caregiver will verbalize understanding of disease process and disease management Date Initiated: 08/01/2019 Target Resolution Date: 09/02/2019 Goal Status: Active Interventions: Assess peripheral edema status every visit. Compression as ordered Provide education on venous insufficiency Notes: Wound/Skin Impairment Nursing Diagnoses: Impaired tissue integrity Knowledge deficit related to smoking impact on wound healing Knowledge deficit related to ulceration/compromised skin integrity Goals: Patient/caregiver will verbalize understanding of skin care regimen Date Initiated: 08/01/2019 Target Resolution Date: 09/02/2019 Goal Status: Active Interventions: Assess patient/caregiver ability to obtain necessary supplies Assess patient/caregiver ability to perform ulcer/skin care regimen upon admission and as needed Assess ulceration(s) every visit Provide education on ulcer and skin care Notes: Electronic Signature(s) Signed: 08/23/2019 5:30:42 PM By: Zandra Abts RN, BSN Entered By: Zandra Abts on 08/22/2019 19:31:52 -------------------------------------------------------------------------------- Pain Assessment  Details Patient Name: Date of Service: Dennis Wyatt, Dennis Wyatt 08/22/2019 2:45 PM Medical Record [id number] Patient Account Number: [id number] Date of Birth/Sex: Treating RN: 1961-01-27 (58 y.o. Melonie [place] Primary Care Jasier Calabretta: [name] Other Clinician: Referring Bayani Renteria: Treating Alder Murri/Extender:[name], [name], [name]: 3 Active Problems Location of Pain Severity and Description of Pain Patient Has Paino No Site Locations Pain Management and Medication Current Pain Management: Electronic Signature(s) Signed: 08/22/2019 5:29:28 PM By: Yevonne Pax RN Entered By: Yevonne Pax on 08/22/2019 14:48:40 -------------------------------------------------------------------------------- Patient/Caregiver Education Details Patient Name: Date of Service: Dennis Wyatt, Dennis Wyatt 2/8/2021andnbsp2:45 PM Medical Record [id number] Patient Account Number: [id number] Date of Birth/Gender: 06/04/1961 (58 y.o. M) Treating RN: Zandra Abts Primary Care Physician: [name] Other Clinician: Referring Physician: Treating Physician/Extender:[name], [name], [name]: 3 Education Assessment Education Provided To: Patient Education Topics Provided Wound/Skin Impairment: Methods: Explain/Verbal Responses: State content  correctly Electronic Signature(s) Signed: 08/23/2019 5:30:42 PM By: Zandra Abts RN, BSN Entered By: Zandra Abts on 08/22/2019 19:32:08 -------------------------------------------------------------------------------- Wound Assessment Details Patient Name: Date of Service: Dennis Wyatt, Dennis Wyatt 08/22/2019 2:45 PM Medical Record [id number] Patient Account Number: [id number] Date of Birth/Sex: Treating RN: Jul 17, 1960 (58 y.o. M) Primary Care Shaiden Aldous: [name] Other Clinician: Referring Masato Pettie: Treating Scorpio Fortin/Extender:[name], [name], [name]:  3 Wound Status Wound Number: 1 Primary Venous Leg Ulcer Etiology: Wound Location: Right Lower Leg - Anterior Secondary Diabetic Wound/Ulcer of the Lower Wounding Event: Trauma Etiology: Extremity Date Acquired: 06/14/2019 Wound Open Weeks Of Treatment: 3 Status: Clustered Wound: No Comorbid Deep Vein Thrombosis, Hypertension, Type History: II Diabetes, Osteoarthritis Photos Wound Measurements Length: (cm) 3.8 % Reducti Width: (cm) 2.5 % Reduct Depth: (cm) 0.2 Epitheli Area: (cm) 7.461 Tunneli Volume: (cm) 1.492 Undermi Wound Description Classification: Full Thickness Without Exposed Support Foul Odo Structures Slough/F Wound Flat and Intact Margin: Exudate Small Amount: Exudate Serosanguineous Type: Exudate red, brown Color: Wound Bed Granulation Amount: Large (67-100%) Granulation Quality: Red Fascia E Necrotic Amount: Small (1-33%) Fat Laye Necrotic Quality: Adherent Slough Tendon E Muscle E Joint Ex Bone Exp r After Cleansing: No ibrino Yes Exposed Structure xposed: No r (Subcutaneous Tissue) Exposed: Yes xposed: No xposed: No posed: No osed: No on in Area: 8.3% ion in Volume: 8.3% alization: None ng: No ning: No Treatment Notes Wound #1 (Right, Anterior Lower Leg) 1. Cleanse With Wound Cleanser Soap and water 2. Periwound Care Moisturizing lotion TCA Cream 3. Primary Dressing Applied Iodoflex 4. Secondary Dressing ABD Pad Kerramax/Xtrasorb 6. Support Layer Applied 3 layer compression wrap Notes netting. Electronic Signature(s) Signed: 08/24/2019 4:31:42 PM By: Benjaman Kindler EMT/HBOT Previous Signature: 08/22/2019 5:29:28 PM Version By: Yevonne Pax RN Entered By: Benjaman Kindler on 08/24/2019 14:40:00 -------------------------------------------------------------------------------- Wound Assessment Details Patient Name: Date of Service: Dennis Wyatt, Dennis Wyatt 08/22/2019 2:45 PM Medical Record [id number] Patient Account Number:  [id number] Date of Birth/Sex: Treating RN: [birth year] (58 y.o. M) Primary Care Deloras Reichard: [name] Other Clinician: Referring Quinto Tippy: Treating Anett Ranker/Extender:[name], [name], [name]: 3 Wound Status Wound Number: 2 Primary Venous Leg Ulcer Etiology: Wound Location: Right Lower Leg - Lateral Secondary Diabetic Wound/Ulcer of the Lower Wounding Event: Gradually Appeared Etiology: Extremity Date Acquired: 07/11/2019 Wound Open Weeks Of Treatment: 3 Status: Clustered Wound: No Comorbid Deep Vein Thrombosis, Hypertension, Type History: II Diabetes, Osteoarthritis Photos Wound Measurements Length: (cm) 2 % Reduct Width: (cm) 1.6 % Reduct Depth: (cm) 0.2 Epitheli Area: (cm) 2.513 Tunneli Volume: (cm) 0.503 Undermi Wound Description Full Thickness Without Exposed Support Foul Odo Classification: Structures Slough/F Wound Flat and Intact Margin: Exudate Small Amount: Exudate Serosanguineous Type: Exudate red, brown Color: Wound Bed Granulation Amount: Medium (34-66%) Granulation Quality: Red Fascia E Necrotic Amount: Medium (34-66%) Fat Laye Necrotic Quality: Adherent Slough Tendon E Muscle E Joint Ex Bone Exp r After Cleansing: No ibrino Yes Exposed Structure xposed: No r (Subcutaneous Tissue) Exposed: Yes xposed: No xposed: No posed: No osed: No ion in Area: 23.8% ion in Volume: 49.2% alization: None ng: No ning: No Treatment Notes Wound #2 (Right, Lateral Lower Leg) 1. Cleanse With Wound Cleanser Soap and water 2. Periwound Care Moisturizing lotion TCA Cream 3. Primary Dressing Applied Iodoflex 4. Secondary Dressing ABD Pad Kerramax/Xtrasorb 6. Support Layer Applied 3 layer compression wrap Notes netting. Electronic Signature(s) Signed: 08/24/2019 4:31:42 PM By: Benjaman Kindler EMT/HBOT Previous Signature: 08/22/2019 5:29:28 PM Version By: Yevonne Pax RN Entered By: Benjaman Kindler on  08/24/2019 14:40:37 -------------------------------------------------------------------------------- Wound Assessment Details Patient Name: Date of Service: GARNIE, BLODGETT 08/22/2019 2:45 PM Medical Record Number:[id number] Patient Account Number: [id number] Date of Birth/Sex: Treating RN: [birth year] (59 y.o. M) Primary Care Cyndy Braver: [name] Other Clinician: Referring Reshaun Briseno: Treating Tiffine Henigan/Extender:[name], [name], [name]: 3 Wound Status Wound Number: 3 Primary Venous Leg Ulcer Etiology: Wound Location: Right Lower Leg - Posterior Secondary Diabetic Wound/Ulcer of the Lower Wounding Event: Gradually Appeared Etiology: Extremity Date Acquired: 07/11/2019 Wound Open Weeks Of Treatment: 3 Status: Clustered Wound: No Comorbid Deep Vein Thrombosis, Hypertension, Type History: II Diabetes, Osteoarthritis Photos Wound Measurements Length: (cm) 0.8 % Reducti Width: (cm) 1.7 % Reducti Depth: (cm) 0.2 Epitheli Area: (cm) 1.068 Tunneli Volume: (cm) 0.214 Undermi Wound Description Full Thickness Without Exposed Support Foul Odo Classification: Structures Slough/F Wound Flat and Intact Margin: Exudate Small Amount: Exudate Serosanguineous Type: Exudate red, brown Color: Wound Bed Granulation Amount: Small (1-33%) Granulation Quality: Pink Fascia E Necrotic Amount: Large (67-100%) Fat Laye Necrotic Quality: Adherent Slough Tendon E Muscle E Joint Ex Bone Exp r After Cleansing: No ibrino Yes Exposed Structure xposed: No r (Subcutaneous Tissue) Exposed: Yes xposed: No xposed: No posed: No osed: No on in Area: 69.8% on in Volume: 39.4% alization: None ng: No ning: No Treatment Notes Wound #3 (Right, Posterior Lower Leg) 1. Cleanse With Wound Cleanser Soap and water 2. Periwound Care Moisturizing lotion TCA Cream 3. Primary Dressing Applied Iodoflex 4. Secondary Dressing ABD Pad Kerramax/Xtrasorb 6.  Support Layer Applied 3 layer compression wrap Notes netting. Electronic Signature(s) Signed: 08/24/2019 4:31:42 PM By: Benjaman Kindler EMT/HBOT Previous Signature: 08/22/2019 5:29:28 PM Version By: Yevonne Pax RN Entered By: Benjaman Kindler on 08/24/2019 14:40:56 -------------------------------------------------------------------------------- Vitals Details Patient Name: Date of Service: JAMINE, KUEHNLE 08/22/2019 2:45 PM Medical Record [id number] Patient Account Number: [id number] Date of Birth/Sex: Treating RN: 1961/05/28 (58 y.o. Judie Petit) Yevonne Pax Primary Care Xeng Kucher: Other Clinician: [name] Referring Miken Stecher: Treating Hugh Garrow/Extender:[name], [name], [name]: 3 Vital Signs Time Taken: 14:44 Temperature (F): 98.5 Height (in): 70 Pulse (bpm): 71 Weight (lbs): 302 Respiratory Rate (breaths/min): 18 Body Mass Index (BMI): 43.3 Blood Pressure (mmHg): 116/60 Reference Range: 80 - 120 mg / dl Electronic Signature(s) Signed: 08/22/2019 5:29:28 PM By: Yevonne Pax RN Entered By: Yevonne Pax on 08/22/2019 14:48:30

## 2019-08-29 ENCOUNTER — Encounter (HOSPITAL_BASED_OUTPATIENT_CLINIC_OR_DEPARTMENT_OTHER): Payer: PPO | Admitting: Internal Medicine

## 2019-08-29 ENCOUNTER — Other Ambulatory Visit: Payer: Self-pay

## 2019-08-29 DIAGNOSIS — I87311 Chronic venous hypertension (idiopathic) with ulcer of right lower extremity: Secondary | ICD-10-CM | POA: Diagnosis not present

## 2019-08-29 DIAGNOSIS — E11622 Type 2 diabetes mellitus with other skin ulcer: Secondary | ICD-10-CM | POA: Diagnosis not present

## 2019-08-29 DIAGNOSIS — L97812 Non-pressure chronic ulcer of other part of right lower leg with fat layer exposed: Secondary | ICD-10-CM | POA: Diagnosis not present

## 2019-08-29 NOTE — Progress Notes (Signed)
CHRIST, KIRN ([id number]) Visit Report for 08/29/2019 Debridement Details Patient Name: Date of Service: Dennis Wyatt, Dennis Wyatt 08/29/2019 2:45 PM Medical Record Number:[id number] Patient Account Number: [id number] Date of Birth/Sex: Treating RN: 02/06/1961 (59 y.o. Dennis Wyatt Primary Care Provider: [name] Other Clinician: Referring Provider: Treating Provider/Extender:Nivaan Dicenzo, [name], [name]: 4 Debridement Performed for Wound #2 Right,Lateral Lower Leg Assessment: Performed By: Physician Maxwell Caul., MD Debridement Type: Debridement Severity of Tissue Pre Fat layer exposed Debridement: Level of Consciousness (Pre- Awake and Alert procedure): Pre-procedure Verification/Time Out Taken: Yes - 16:30 Start Time: 16:30 Pain Control: Other : Benzocaine 20% Total Area Debrided (L x W): 2.8 (cm) x 1.7 (cm) = 4.76 (cm) Tissue and other material Viable, Non-Viable, Slough, Subcutaneous, Skin: Epidermis, Slough debrided: Level: Skin/Subcutaneous Tissue Debridement Description: Excisional Instrument: Curette Bleeding: Moderate Hemostasis Achieved: Silver Nitrate End Time: 16:32 Procedural Pain: 3 Post Procedural Pain: 0 Response to Treatment: Procedure was tolerated well Level of Consciousness Awake and Alert (Post-procedure): Post Debridement Measurements of Total Wound Length: (cm) 2.8 Width: (cm) 1.7 Depth: (cm) 0.2 Volume: (cm) 0.748 Character of Wound/Ulcer Post Improved Debridement: Severity of Tissue Post Debridement: Fat layer exposed Post Procedure Diagnosis Same as Pre-procedure Electronic Signature(s) Signed: 08/29/2019 6:14:31 PM By: Zandra Abts RN, BSN Signed: 08/29/2019 6:16:05 PM By: [name] MD Entered By: Zandra Abts on 08/29/2019 16:32:41 -------------------------------------------------------------------------------- HPI Details Patient Name: Date of Service: Dennis Wyatt, Dennis Wyatt 08/29/2019  2:45 PM Medical Record [id number] Patient Account Number: [id number] Date of Birth/Sex: Treating RN: 03-May-1961 (59 y.o. Dennis Wyatt Primary Care Provider: [name] Other Clinician: Referring Provider: Treating Provider/Extender:[name], [name], [name]: 4 History of Present Illness HPI Description: ADMISSION 08/01/2019 This is a 59 year old man who is essentially self-referred although he gets his primary care at [facility]. We did not have any information from them. He is here for review of 3 wounds on the right lower leg. He states about a month and a half ago he dropped his cell phone on the right anterior leg creating a wound. More recently he has had 2 blisters come out 1 medially just above the lateral malleolus and one on the posterior calf. All of these are roughly the same condition however. He has been applying some form of ointment were not really sure what this is. He does not have a wound history. 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 ABI in our clinic was 0.95 1/25; the patient has 3 wounds on the right leg. One is on the right anterior 1 just above the lateral malleolus and one on the posterior calf. We have been using Iodoflex under compression. The base of these wounds appears better 2/1; still the 3 wounds one on the right anterior 1 on the lateral and 1 on the posterior calf we have been using Iodoflex under compression making some general improvement in the wound bed. The patient went for his reflux studies on 1/28. This showed reflux in the common femoral vein and greater than 1 second in the greater saphenous vein at the saphenofemoral junction but nothing in the more superficial veins below the knee either  greater or lesser saphenous. I therefore I do not think he is going to be M amenable for an ablation. The report did note an aging consistent deep venous thrombosis involving the right  popliteal artery. This led me to research [place] link. He had a similar finding on a an arterial duplex study in 2014 at which time he was recovering from a subarachnoid hemorrhage. This was seen by the rehab team who admitted him to rehab after the surgery for an anterior cerebral aneurysm. By that time it was felt safe to anticoagulate him and he was put on Xarelto and continued that according to the patient to the last year or 2 when it was stopped and he is only on aspirin. 2/8; all of his wounds appear to be making some progress. We have been using Iodoflex. Base of the wounds appear to be cleaning up nicely but still requiring debridement. We have tried to put Apligraf through his insurance related to deep punched out chronic venous insufficiency wounds and a type II diabetic. We will see if this is affordable 2/15; we are using Iodoflex on the wound areas. Everything looks like it is getting better the anterior and posterior wounds on the right contracted however the area on the lateral side is about the same size. We did not hear anything about Apligraf. Electronic Signature(s) Signed: 08/29/2019 6:16:05 PM By: [name] MD Entered By: [name] on 08/29/2019 17:53:48 -------------------------------------------------------------------------------- Physical Exam Details Patient Name: Date of Service: Dennis Wyatt, Dennis Wyatt 08/29/2019 2:45 PM Medical Record [id number] Patient Account Number: [id number] Date of Birth/Sex: Treating RN: 03-Jan-1961 (59 y.o. Dennis Wyatt Primary Care Provider: [name] Other Clinician: Referring Provider: Treating Provider/Extender:Krisalyn Yankowski, [name], [name]: 4 Cardiovascular It will pulses are  palpable. Notes Wound exam Major wound on the lower leg including an area on the anterior tibia, lateral lower leg and posteriorly. The area anteriorly and posteriorly are smaller however the area laterally is not this has a rolled senescent edges which I knocked down with a #5 curette hemostasis with direct pressure removing skin and subcutaneous tissue Electronic Signature(s) Signed: 08/29/2019 6:16:05 PM By: [name] MD Entered By: [name] on 08/29/2019 17:54:39 -------------------------------------------------------------------------------- Physician Orders Details Patient Name: Date of Service: Dennis Wyatt, Dennis Wyatt 08/29/2019 2:45 PM Medical Record [id number] Patient Account Number: [id number] Date of Birth/Sex: Treating RN: 1960-08-12 (58 y.o. Dennis Wyatt Primary Care Provider: [name] Other Clinician: Referring Provider: Treating Provider/Extender:Haileigh Pitz, [name], [name]: 4 Verbal / Phone Orders: No Diagnosis Coding ICD-10 Coding Code Description [phone] Non-pressure chronic ulcer of other part of right lower leg with fat layer exposed I87.331 Chronic venous hypertension (idiopathic) with ulcer and inflammation of right lower extremity E11.622 Type 2 diabetes mellitus with other skin ulcer E11.42 Type 2 diabetes mellitus with diabetic polyneuropathy Follow-up Appointments Return Appointment in 1 week. Dressing Change Frequency Do not change entire dressing for one week. Skin Barriers/Peri-Wound Care Moisturizing lotion TCA Cream or Ointment - mixed with lotion Wound Cleansing May shower with protection. - may use cast protector Primary Wound Dressing Wound #1 Right,Anterior Lower Leg Hydrofera Blue - Classic Wound #2 Right,Lateral Lower Leg Hydrofera Blue - Classic Wound #3 Right,Posterior Lower Leg Hydrofera Blue - Classic Secondary Dressing Wound #1 Right,Anterior Lower Leg ABD pad Zetuvit or  Kerramax Wound #2 Right,Lateral Lower Leg ABD pad Zetuvit or Kerramax Wound #3 Right,Posterior Lower Leg ABD pad Zetuvit or Kerramax Edema Control 3 Layer Compression System - Right Lower Extremity Avoid standing for long periods of time Elevate legs to the level of the heart or above for 30 minutes daily and/or when sitting, a frequency of: - throughout the day  Exercise regularly Electronic Signature(s) Signed: 08/29/2019 6:14:31 PM By: [name] RN, BSN Signed: 08/29/2019 6:16:05 PM By: Linton Ham MD Entered By: [name] on 08/29/2019 16:33:17 -------------------------------------------------------------------------------- Problem List Details Patient Name: Date of Service: Dennis Wyatt, Dennis Wyatt 08/29/2019 2:45 PM Medical Record [id number] Patient Account Number: [id number] Date of Birth/Sex: Treating RN: 01-24-1961 (59 y.o. [name] Primary Care Provider: [name] Other Clinician: Referring Provider: Treating Provider/Extender:Danyela Posas, [name], [name] Weeks in Treatment: 4 Active Problems ICD-10 Evaluated Encounter Code Description Active Date Today Diagnosis [phone] Non-pressure chronic ulcer of other part of right lower 08/01/2019 No Yes leg with fat layer exposed I87.331 Chronic venous hypertension (idiopathic) with ulcer 08/01/2019 No Yes and inflammation of right lower extremity E11.622 Type 2 diabetes mellitus with other skin ulcer 08/01/2019 No Yes E11.42 Type 2 diabetes mellitus with diabetic polyneuropathy 08/01/2019 No Yes Inactive Problems Resolved Problems Electronic Signature(s) Signed: 08/29/2019 6:16:05 PM By: Linton Ham MD Entered By: Linton Ham on 08/29/2019 17:52:36 -------------------------------------------------------------------------------- Progress Note Details Patient Name: Date of Service: Dennis Wyatt, Dennis Wyatt 08/29/2019 2:45 PM Medical Record [id number] Patient Account Number:  [id number] Date of Birth/Sex: Treating RN: [birth year] (59 y.o. [name] Primary Care Provider: [name] Other Clinician: Referring Provider: Treating Provider/Extender:Cassell Voorhies, [name], [name] Weeks in Treatment: 4 Subjective History of Present Illness (HPI) ADMISSION 08/01/2019 This is a 59 year old man who is essentially self-referred although he gets his primary care at [place]. We did not have any information from them. He is here for review of 3 wounds on the right lower leg. He states about a month and a half ago he dropped his cell phone on the right anterior leg creating a wound. More recently he has had 2 blisters come out 1 medially just above the lateral malleolus and one on the posterior calf. All of these are roughly the same condition however. He has been applying some form of ointment were not really sure what this is. He does not have a wound history. 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 ABI in our clinic was 0.95 1/25; the patient has 3 wounds on the right leg. One is on the right anterior 1 just above the lateral malleolus and one on the posterior calf. We have been using Iodoflex under compression. The base of these wounds appears better 2/1; still the 3 wounds one on the right anterior 1 on the lateral and 1 on the posterior calf we have been using Iodoflex under compression making some general improvement in the wound bed. The patient went for his reflux studies on 1/28. This showed reflux in the common femoral vein and greater than 1 second in the greater saphenous vein at the saphenofemoral junction but nothing in the more superficial veins below the knee either greater or lesser saphenous. I therefore I do not think he is  going to be M amenable for an ablation. The report did note an aging consistent deep venous thrombosis involving the right popliteal artery. This led me to research [place] link. He had a similar finding on a an arterial duplex study in 2014 at which time he was recovering from a subarachnoid hemorrhage. This was seen by the rehab team who admitted him to rehab after the surgery for an anterior cerebral aneurysm. By that time it was felt safe to anticoagulate him and he was  put on Xarelto and continued that according to the patient to the last year or 2 when it was stopped and he is only on aspirin. 2/8; all of his wounds appear to be making some progress. We have been using Iodoflex. Base of the wounds appear to be cleaning up nicely but still requiring debridement. We have tried to put Apligraf through his insurance related to deep punched out chronic venous insufficiency wounds and a type II diabetic. We will see if this is affordable 2/15; we are using Iodoflex on the wound areas. Everything looks like it is getting better the anterior and posterior wounds on the right contracted however the area on the lateral side is about the same size. We did not hear anything about Apligraf. Objective Constitutional Vitals Time Taken: 3:40 PM, Height: 70 in, Weight: 302 lbs, BMI: 43.3, Temperature: 98.2 F, Pulse: 75 bpm, Respiratory Rate: 19 breaths/min, Blood Pressure: 158/77 mmHg. Cardiovascular It will pulses are palpable. General Notes: Wound exam ooMajor wound on the lower leg including an area on the anterior tibia, lateral lower leg and posteriorly. The area anteriorly and posteriorly are smaller however the area laterally is not this has a rolled senescent edges which I knocked down with a #5 curette hemostasis with direct pressure removing skin and subcutaneous tissue Integumentary (Hair, Skin) Wound #1 status is Open. Original cause of wound was Trauma. The wound is located on the  Right,Anterior Lower Leg. The wound measures 3.4cm length x 2.8cm width x 0.1cm depth; 7.477cm^2 area and 0.748cm^3 volume. There is Fat Layer (Subcutaneous Tissue) Exposed exposed. There is no tunneling or undermining noted. There is a small amount of serosanguineous drainage noted. The wound margin is flat and intact. There is large (67-100%) red granulation within the wound bed. There is a small (1-33%) amount of necrotic tissue within the wound bed including Adherent Slough. Wound #2 status is Open. Original cause of wound was Gradually Appeared. The wound is located on the Right,Lateral Lower Leg. The wound measures 2.8cm length x 1.7cm width x 0.2cm depth; 3.738cm^2 area and 0.748cm^3 volume. There is Fat Layer (Subcutaneous Tissue) Exposed exposed. There is no tunneling or undermining noted. There is a small amount of serosanguineous drainage noted. The wound margin is flat and intact. There is medium (34-66%) red granulation within the wound bed. There is a medium (34-66%) amount of necrotic tissue within the wound bed including Adherent Slough. Wound #3 status is Open. Original cause of wound was Gradually Appeared. The wound is located on the Right,Posterior Lower Leg. The wound measures 1cm length x 1.3cm width x 0.1cm depth; 1.021cm^2 area and 0.102cm^3 volume. Assessment Active Problems ICD-10 Non-pressure chronic ulcer of other part of right lower leg with fat layer exposed Chronic venous hypertension (idiopathic) with ulcer and inflammation of right lower extremity Type 2 diabetes mellitus with other skin ulcer Type 2 diabetes mellitus with diabetic polyneuropathy Procedures Wound #2 Pre-procedure diagnosis of Wound #2 is a Venous Leg Ulcer located on the Right,Lateral Lower Leg .Severity of Tissue Pre Debridement is: Fat layer exposed. There was a Excisional Skin/Subcutaneous Tissue Debridement with a total area of 4.76 sq cm performed by Maxwell Caul., MD. With the  following instrument(s): Curette to remove Viable and Non-Viable tissue/material. Material removed includes Subcutaneous Tissue, Slough, and Skin: Epidermis after achieving pain control using Other (Benzocaine 20%). No specimens were taken. A time out was conducted at 16:30, prior to the start of the procedure. A Moderate amount of bleeding was controlled with  Silver Nitrate. The procedure was tolerated well with a pain level of 3 throughout and a pain level of 0 following the procedure. Post Debridement Measurements: 2.8cm length x 1.7cm width x 0.2cm depth; 0.748cm^3 volume. Character of Wound/Ulcer Post Debridement is improved. Severity of Tissue Post Debridement is: Fat layer exposed. Post procedure Diagnosis Wound #2: Same as Pre-Procedure Pre-procedure diagnosis of Wound #2 is a Venous Leg Ulcer located on the Right,Lateral Lower Leg . There was a Three Layer Compression Therapy Procedure by Zandra Abts, RN. Post procedure Diagnosis Wound #2: Same as Pre-Procedure Wound #1 Pre-procedure diagnosis of Wound #1 is a Venous Leg Ulcer located on the Right,Anterior Lower Leg . There was a Three Layer Compression Therapy Procedure by Zandra Abts, RN. Post procedure Diagnosis Wound #1: Same as Pre-Procedure Wound #3 Pre-procedure diagnosis of Wound #3 is a Venous Leg Ulcer located on the Right,Posterior Lower Leg . There was a Three Layer Compression Therapy Procedure by Zandra Abts, RN. Post procedure Diagnosis Wound #3: Same as Pre-Procedure Plan Follow-up Appointments: Return Appointment in 1 week. Dressing Change Frequency: Do not change entire dressing for one week. Skin Barriers/Peri-Wound Care: Moisturizing lotion TCA Cream or Ointment - mixed with lotion Wound Cleansing: May shower with protection. - may use cast protector Primary Wound Dressing: Wound #1 Right,Anterior Lower Leg: Hydrofera Blue - Classic Wound #2 Right,Lateral Lower Leg: Hydrofera Blue -  Classic Wound #3 Right,Posterior Lower Leg: Hydrofera Blue - Classic Secondary Dressing: Wound #1 Right,Anterior Lower Leg: ABD pad Zetuvit or Kerramax Wound #2 Right,Lateral Lower Leg: ABD pad Zetuvit or Kerramax Wound #3 Right,Posterior Lower Leg: ABD pad Zetuvit or Kerramax Edema Control: 3 Layer Compression System - Right Lower Extremity Avoid standing for long periods of time Elevate legs to the level of the heart or above for 30 minutes daily and/or when sitting, a frequency of: - throughout the day Exercise regularly 1. I change the dressing to Hydrofera Blue 2. Debridement of the senescent edges on the right lateral lower leg 3. Still under the same compression 4. Awaiting to hear from Apligraf Electronic Signature(s) Signed: 08/29/2019 6:16:05 PM By: [name] MD Entered By: [name] on 08/29/2019 17:57:53 -------------------------------------------------------------------------------- SuperBill Details Patient Name: Date of Service: Dennis Wyatt, Dennis Wyatt 08/29/2019 Medical Record Number:[id number] Patient Account Number: [id number] Date of Birth/Sex: Treating RN: May 10, 1961 (58 y.o. Dennis Wyatt Primary Care Provider: [name] Other Clinician: Referring Provider: Treating Provider/Extender:Ethelean Colla, [name], [name]: 4 Diagnosis Coding ICD-10 Codes Code Description [phone] Non-pressure chronic ulcer of other part of right lower leg with fat layer exposed I87.331 Chronic venous hypertension (idiopathic) with ulcer and inflammation of right lower extremity E11.622 Type 2 diabetes mellitus with other skin ulcer E11.42 Type 2 diabetes mellitus with diabetic polyneuropathy Facility Procedures CPT4 Code Description: [id number] 11042 - DEB SUBQ TISSUE 20 SQ CM/< ICD-10 Diagnosis Description L97.812 Non-pressure chronic ulcer of other part of right lower le Modifier: g with fat laye Quantity: 1 r exposed Physician  Procedures CPT4 Code Description: [id number] 11042 - WC PHYS SUBQ TISS 20 SQ CM ICD-10 Diagnosis Description L97.812 Non-pressure chronic ulcer of other part of right lower le Modifier: g with fat laye Quantity: 1 r exposed Electronic Signature(s) Signed: 08/29/2019 6:16:05 PM By: [name] MD Entered By: [name] on 08/29/2019 17:58:10

## 2019-08-29 NOTE — Progress Notes (Signed)
[name], BRUINS ([id number]) Visit Report for 08/29/2019 Arrival Information Details Patient Name: Date of Service: Dennis Wyatt, Dennis Wyatt 08/29/2019 2:45 PM Medical Record [id number] Patient Account Number: [id number] Date of Birth/Sex: Treating RN: [birth year] (59 y.o. [name] Primary Care Montasia Chisenhall: [name] Other Clinician: Referring Micahel Omlor: Treating Cassey Bacigalupo/Extender:[name], [name], [name] Weeks in Treatment: 4 Visit Information History Since Last Visit Cane Added or deleted any medications: No Patient Arrived: 15:40 Any new allergies or adverse reactions: No Arrival Time: Had a fall or experienced change in No Accompanied By: self None activities of daily living that may affect Transfer Assistance: risk of falls: Patient Identification Verified: Yes Signs or symptoms of abuse/neglect since last No Secondary Verification Process Completed: Yes visito Patient Requires Transmission-Based No Hospitalized since last visit: No Precautions: Implantable device outside of the clinic excluding No Patient Has Alerts: No cellular tissue based products placed in the center since last visit: Has Dressing in Place as Prescribed: Yes Has Compression in Place as Prescribed: Yes Pain Present Now: No Electronic Signature(s) Signed: 08/29/2019 6:12:41 PM By: [name] Entered By: [name] on 08/29/2019 15:40:35 -------------------------------------------------------------------------------- Compression Therapy Details Patient Name: Date of Service: Dennis Wyatt 08/29/2019 2:45 PM Medical Record [id number] Patient Account Number: [id number] Date of Birth/Sex: Treating RN: 1961/02/16 (59 y.o. [name] Primary Care Jimmie Rueter: [name] Other Clinician: Referring Maeli Spacek: Treating Hilaria Titsworth/Extender:[name], [name], [name] Weeks in Treatment: 4 Compression Therapy Performed for Wound Wound #1  Right,Anterior Lower Leg Assessment: Performed By: Clinician [name], RN Compression Type: Three Layer Post Procedure Diagnosis Same as Pre-procedure Electronic Signature(s) Signed: 08/29/2019 6:14:31 PM By: [name] RN, BSN Entered By: [name] on 08/29/2019 16:32:52 -------------------------------------------------------------------------------- Compression Therapy Details Patient Name: Date of Service: Dennis Wyatt 08/29/2019 2:45 PM Medical Record [id number] Patient Account Number: [id number] Date of Birth/Sex: Treating RN: Apr 30, 1961 (59 y.o. [name] Primary Care Alexsandro Salek: [name] Other Clinician: Referring Pearla Mckinny: Treating Hazel Wrinkle/Extender:[name], [name], [name] Weeks in Treatment: 4 Compression Therapy Performed for Wound Wound #2 Right,Lateral Lower Leg Assessment: Performed By: Clinician [name], RN Compression Type: Three Layer Post Procedure Diagnosis Same as Pre-procedure Electronic Signature(s) Signed: 08/29/2019 6:14:31 PM By: [name] RN, BSN Entered By: [name] on 08/29/2019 16:32:52 -------------------------------------------------------------------------------- Compression Therapy Details Patient Name: Date of Service: Dennis Wyatt 08/29/2019 2:45 PM Medical Record [id number] Patient Account Number: [id number] Date of Birth/Sex: Treating RN: 10-04-1960 (59 y.o. [name] Primary Care Jakeim Sedore: [name] Other Clinician: Referring Jamielee Mchale: Treating Violia Knopf/Extender:[name], [name], [name] Weeks in Treatment: 4 Compression Therapy Performed for Wound Wound #3 Right,Posterior Lower Leg Assessment: Performed By: Clinician [name], RN Compression Type: Three Layer Post Procedure Diagnosis Same as Pre-procedure Electronic Signature(s) Signed: 08/29/2019 6:14:31 PM By: [name] RN, BSN Entered By: [name] on  08/29/2019 16:32:53 -------------------------------------------------------------------------------- Encounter Discharge Information Details Patient Name: Date of Service: Dennis Wyatt 08/29/2019 2:45 PM Medical Record [id number] Patient Account Number: [id number] Date of Birth/Sex: Treating RN: 1960/12/31 (59 y.o. [name] Primary Care Aryaan Persichetti: [name] Other Clinician: Referring Yahia Bottger: Treating Dory Verdun/Extender:[name], [name], [name] Weeks in Treatment: 4 Encounter Discharge Information Items Post Procedure Vitals Discharge Condition: Stable Temperature (F): 98.2 Ambulatory Status: Cane Pulse (bpm): 75 Discharge Destination: Home Respiratory Rate (breaths/min): 19 Transportation: Private Auto Blood Pressure (mmHg): 158/77 Accompanied By: self Schedule Follow-up Appointment: Yes Clinical Summary of Care: Electronic Signature(s) Signed: 08/29/2019 6:19:44 PM By: [name] Entered By: [name] on 08/29/2019 16:52:14 -------------------------------------------------------------------------------- Lower  Extremity Assessment Details Patient Name: Date of Service: Dennis Wyatt, Dennis Wyatt 08/29/2019 2:45 PM Medical Record [id number] Patient Account Number: [id number] Date of Birth/Sex: Treating RN: [birth year] (59 y.o. [name] Primary Care Bernie Fobes: [name] Other Clinician: Referring [name]: Treating Gera Inboden/Extender:[name], [name], [name] Weeks in Treatment: 4 Edema Assessment Assessed: [Left: No] [Right: No] Edema: [Left: Ye] [Right: s] Calf Left: Right: Point of Measurement: 36 cm From Medial Instep cm 39 cm Ankle Left: Right: Point of Measurement: 9 cm From Medial Instep cm 27 cm Vascular Assessment Pulses: Dorsalis Pedis Palpable: [Right:Yes] Electronic Signature(s) Signed: 08/29/2019 6:12:41 PM By: [name] Entered By: [name] on 08/29/2019  15:44:35 -------------------------------------------------------------------------------- Multi Wound Chart Details Patient Name: Date of Service: Dennis Wyatt, Dennis Wyatt 08/29/2019 2:45 PM Medical Record [id number] Patient Account Number: [id number] Date of Birth/Sex: Treating RN: 12-Feb-1961 (59 y.o. Jonette Eva, Briant Cedar Primary Care Rylin Seavey: [name] Other Clinician: Referring Calvin Jablonowski: Treating Chole Driver/Extender:[name], [name], [name] Weeks in Treatment: 4 Vital Signs Height(in): 70 Pulse(bpm): 3 Weight(lbs): 302 Blood Pressure(mmHg): 158/77 Body Mass Index(BMI): 43 Temperature(F): 98.2 Respiratory 19 Rate(breaths/min): Photos: [1:No Photos] [2:No Photos] [3:No Photos] Wound Location: [1:Right Lower Leg - Anterior Right Lower Leg - Lateral Right, Posterior Lower Leg] Wounding Event: [1:Trauma] [2:Gradually Appeared] [3:Gradually Appeared] Primary Etiology: [1:Venous Leg Ulcer] [2:Venous Leg Ulcer] [3:Venous Leg Ulcer] Secondary Etiology: [1:Diabetic Wound/Ulcer of the Diabetic Wound/Ulcer of the Diabetic Wound/Ulcer of the Lower Extremity] [2:Lower Extremity] [3:Lower Extremity] Comorbid History: [1:Deep Vein Thrombosis, Hypertension, Type II Diabetes, Osteoarthritis] [2:Deep Vein Thrombosis, Hypertension, Type II Diabetes, Osteoarthritis] [3:N/A] Date Acquired: [1:06/14/2019] [2:07/11/2019] [3:07/11/2019] Weeks of Treatment: [1:4] [2:4] [3:4] Wound Status: [1:Open] [2:Open] [3:Open] Measurements L x W x D 3.4x2.8x0.1 [2:2.8x1.7x0.2] [3:1x1.3x0.1] (cm) Area (cm) : [1:7.477] [2:3.738] [3:1.021] Volume (cm) : [1:0.748] [2:0.748] [3:0.102] % Reduction in Area: [1:8.10%] [2:-13.30%] [3:71.10%] % Reduction in Volume: 54.00% [2:24.40%] [3:71.10%] Classification: [1:Full Thickness Without Exposed Support Structures Exposed Support Structures Exposed Support Structures] [2:Full Thickness Without] [3:Full Thickness Without] Exudate Amount: [1:Small]  [2:Small] [3:N/A] Exudate Type: [1:Serosanguineous] [2:Serosanguineous] [3:N/A] Exudate Color: [1:red, brown] [2:red, brown] [3:N/A] Wound Margin: [1:Flat and Intact] [2:Flat and Intact] [3:N/A] Granulation Amount: [1:Large (67-100%)] [2:Medium (34-66%)] [3:N/A] Granulation Quality: [1:Red] [2:Red] [3:N/A] Necrotic Amount: [1:Small (1-33%)] [2:Medium (34-66%)] [3:N/A] Exposed Structures: [1:Fat Layer (Subcutaneous Tissue) Exposed: Yes Fascia: No Tendon: No Muscle: No Joint: No Bone: No] [2:Fat Layer (Subcutaneous Tissue) Exposed: Yes Fascia: No Tendon: No Muscle: No Joint: No Bone: No] [3:N/A] Epithelialization: [1:None] [2:None] [3:N/A] Debridement: [1:N/A] [2:Debridement - Excisional] [3:N/A] Pre-procedure [1:N/A] [2:16:30] [3:N/A] Verification/Time Out Taken: Pain Control: [1:N/A] [2:Other] [3:N/A] Tissue Debrided: [1:N/A] [2:Subcutaneous, Slough] [3:N/A] Level: [1:N/A] [2:Skin/Subcutaneous Tissue] [3:N/A] Debridement Area (sq cm):N/A [2:4.76] [3:N/A] Instrument: [1:N/A] [2:Curette] [3:N/A] Bleeding: [1:N/A] [2:Moderate] [3:N/A] Hemostasis Achieved: [1:N/A] [2:Silver Nitrate] [3:N/A] Procedural Pain: [1:N/A] [2:3] [3:N/A] Post Procedural Pain: [1:N/A] [2:0] [3:N/A] Debridement Treatment N/A [2:Procedure was tolerated] [3:N/A] Response: [2:well] Post Debridement [1:N/A] [2:2.8x1.7x0.2] [3:N/A] Measurements L x W x D (cm) Post Debridement [1:N/A] [2:0.748] [3:N/A] Volume: (cm) Procedures Performed: Compression Therapy [2:Compression Therapy Debridement] [3:Compression Therapy] Treatment Notes Wound #1 (Right, Anterior Lower Leg) 1. Cleanse With Wound Cleanser Soap and water 2. Periwound Care Moisturizing lotion TCA Cream 3. Primary Dressing Applied Hydrofera Blue 4. Secondary Dressing ABD Pad Kerramax/Xtrasorb 6. Support Layer Applied 3 layer compression wrap Notes primary dressing hydrofera blue classic. netting. Wound #2 (Right, Lateral Lower Leg) 1. Cleanse  With Wound Cleanser Soap and water 2. Periwound Care Moisturizing lotion TCA Cream 3. Primary Dressing Applied Hydrofera Blue 4.  Secondary Dressing ABD Pad Kerramax/Xtrasorb 6. Support Layer Applied 3 layer compression wrap Notes primary dressing hydrofera blue classic. netting. Wound #3 (Right, Posterior Lower Leg) 1. Cleanse With Wound Cleanser Soap and water 2. Periwound Care Moisturizing lotion TCA Cream 3. Primary Dressing Applied Hydrofera Blue 4. Secondary Dressing ABD Pad Kerramax/Xtrasorb 6. Support Layer Applied 3 layer compression wrap Notes primary dressing hydrofera blue classic. netting. Electronic Signature(s) Signed: 08/29/2019 6:14:31 PM By: [name] RN, BSN Signed: 08/29/2019 6:16:05 PM By: Linton Ham MD Entered By: Linton Ham on 08/29/2019 17:52:43 -------------------------------------------------------------------------------- [place] Details Patient Name: Date of Service: NATHANEAL, Dennis Wyatt 08/29/2019 2:45 PM Medical Record [id number] Patient Account Number: [id number] Date of Birth/Sex: Treating RN: [birth year] (59 y.o. [name] Primary Care Ranen Doolin: [name] Other Clinician: Referring Skippy Marhefka: Treating Sapphira Harjo/Extender:[name], [name], [name] Weeks in Treatment: 4 Active Inactive Venous Leg Ulcer Nursing Diagnoses: Knowledge deficit related to disease process and management Potential for venous Insuffiency (use before diagnosis confirmed) Goals: Patient will maintain optimal edema control Date Initiated: 08/01/2019 Target Resolution Date: 09/30/2019 Goal Status: Active Patient/caregiver will verbalize understanding of disease process and disease management Date Initiated: 08/01/2019 Date Inactivated: 08/29/2019 Target Resolution Date: 09/02/2019 Goal Status: Met Interventions: Assess peripheral edema status every visit. Compression as ordered Provide education on  venous insufficiency Notes: Wound/Skin Impairment Nursing Diagnoses: Impaired tissue integrity Knowledge deficit related to smoking impact on wound healing Knowledge deficit related to ulceration/compromised skin integrity Goals: Patient/caregiver will verbalize understanding of skin care regimen Date Initiated: 08/01/2019 Target Resolution Date: 09/30/2019 Goal Status: Active Interventions: Assess patient/caregiver ability to obtain necessary supplies Assess patient/caregiver ability to perform ulcer/skin care regimen upon admission and as needed Assess ulceration(s) every visit Provide education on ulcer and skin care Notes: Electronic Signature(s) Signed: 08/29/2019 6:14:31 PM By: [name] RN, BSN Entered By: [name] on 08/29/2019 16:30:55 -------------------------------------------------------------------------------- Pain Assessment Details Patient Name: Date of Service: SILVESTRE, Dennis Wyatt 08/29/2019 2:45 PM Medical Record [id number] Patient Account Number: [id number] Date of Birth/Sex: Treating RN: 10-16-1960 (59 y.o. [name] Primary Care Rhianon Zabawa: [name] Other Clinician: Referring Miranda Frese: Treating Nayleah Gamel/Extender:[name], [name], [name] Weeks in Treatment: 4 Active Problems Location of Pain Severity and Description of Pain Patient Has Paino No Site Locations Pain Management and Medication Current Pain Management: Electronic Signature(s) Signed: 08/29/2019 6:12:41 PM By: [name] Entered By: [name] on 08/29/2019 15:44:29 -------------------------------------------------------------------------------- Patient/Caregiver Education Details Patient Name: Date of Service: ANDRI, Dennis Wyatt 2/15/2021andnbsp2:45 PM Medical Record [id number] Patient Account Number: [id number] Date of Birth/Gender: Treating RN: Jul 25, 1960 (59 y.o. [name] Primary Care Physician: [name] Other Clinician: Referring Physician: Treating Physician/Extender:[name], [name], [name] Weeks in Treatment: 4 Education Assessment Education Provided To: Patient Education Topics Provided Wound/Skin Impairment: Methods: Explain/Verbal Responses: State content correctly Electronic Signature(s) Signed: 08/29/2019 6:14:31 PM By: [name] RN, BSN Entered By: [name] on 08/29/2019 16:25:57 -------------------------------------------------------------------------------- Wound Assessment Details Patient Name: Date of Service: Dennis Wyatt, Dennis Wyatt 08/29/2019 2:45 PM Medical Record [id number] Patient Account Number: [id number] Date of Birth/Sex: Treating RN: 1960-10-14 (59 y.o. [name] Primary Care Alexys Lobello: [name] Other Clinician: Referring Krystin Keeven: Treating Shaunae Sieloff/Extender:[name], [name], [name] Weeks in Treatment: 4 Wound Status Wound Number: 1 Primary Venous Leg Ulcer Etiology: Wound Location: Right Lower Leg - Anterior Secondary Diabetic Wound/Ulcer of the Lower Wounding Event: Trauma Etiology: Extremity Date Acquired: 06/14/2019 Wound Open Weeks Of Treatment: 4 Status: Clustered Wound: No Comorbid Deep Vein Thrombosis, Hypertension, Type History: II Diabetes,  Osteoarthritis Wound Measurements Length: (cm) 3.4 % Reduct Width: (cm) 2.8 % Reduct Depth: (cm) 0.1 Epitheli Area: (cm) 7.477 Tunneli Volume: (cm) 0.748 Undermi Wound Description Classification: Full Thickness Without Exposed Support Foul Odo Structures Slough/F Wound Flat and Intact Margin: Exudate Small Amount: Exudate Serosanguineous Type: Exudate red, brown Color: Wound Bed Granulation Amount: Large (67-100%) Granulation Quality: Red Fascia E Necrotic Amount: Small (1-33%) Fat Laye Necrotic Quality: Adherent Slough Tendon E Muscle E Joint Ex Bone Exp r After Cleansing: No ibrino Yes Exposed Structure xposed:  No r (Subcutaneous Tissue) Exposed: Yes xposed: No xposed: No posed: No osed: No ion in Area: 8.1% ion in Volume: 54% alization: None ng: No ning: No Treatment Notes Wound #1 (Right, Anterior Lower Leg) 1. Cleanse With Wound Cleanser Soap and water 2. Periwound Care Moisturizing lotion TCA Cream 3. Primary Dressing Applied Hydrofera Blue 4. Secondary Dressing ABD Pad Kerramax/Xtrasorb 6. Support Layer Applied 3 layer compression wrap Notes primary dressing hydrofera blue classic. netting. Electronic Signature(s) Signed: 08/29/2019 6:12:41 PM By: [name] Entered By: [name] on 08/29/2019 15:45:50 -------------------------------------------------------------------------------- Wound Assessment Details Patient Name: Date of Service: DYLLEN, Dennis Wyatt 08/29/2019 2:45 PM Medical Record [id number] Patient Account Number: [id number] Date of Birth/Sex: Treating RN: Jul 31, 1960 (59 y.o. [name] Primary Care Malayja Freund: [name] Other Clinician: Referring Fallynn Gravett: Treating Oree Mirelez/Extender:[name], [name], [name] Weeks in Treatment: 4 Wound Status Wound Number: 2 Primary Venous Leg Ulcer Etiology: Wound Location: Right Lower Leg - Lateral Secondary Diabetic Wound/Ulcer of the Lower Wounding Event: Gradually Appeared Etiology: Extremity Date Acquired: 07/11/2019 Wound Open Weeks Of Treatment: 4 Status: Clustered Wound: No Comorbid Deep Vein Thrombosis, Hypertension, Type History: II Diabetes, Osteoarthritis Wound Measurements Length: (cm) 2.8 % Reduct Width: (cm) 1.7 % Reduct Depth: (cm) 0.2 Epitheli Area: (cm) 3.738 Tunneli Volume: (cm) 0.748 Undermi Wound Description Classification: Full Thickness Without Exposed Support Foul Odo Structures Slough/F Wound Flat and Intact Margin: Exudate Small Amount: Exudate Serosanguineous Type: Exudate red, brown Color: Wound Bed Granulation Amount:  Medium (34-66%) Granulation Quality: Red Fascia E Necrotic Amount: Medium (34-66%) Fat Laye Necrotic Quality: Adherent Slough Tendon E Muscle E Joint Ex Bone Exp r After Cleansing: No ibrino Yes Exposed Structure xposed: No r (Subcutaneous Tissue) Exposed: Yes xposed: No xposed: No posed: No osed: No ion in Area: -13.3% ion in Volume: 24.4% alization: None ng: No ning: No Treatment Notes Wound #2 (Right, Lateral Lower Leg) 1. Cleanse With Wound Cleanser Soap and water 2. Periwound Care Moisturizing lotion TCA Cream 3. Primary Dressing Applied Hydrofera Blue 4. Secondary Dressing ABD Pad Kerramax/Xtrasorb 6. Support Layer Applied 3 layer compression wrap Notes primary dressing hydrofera blue classic. netting. Electronic Signature(s) Signed: 08/29/2019 6:12:41 PM By: [name] Entered By: [name] on 08/29/2019 15:46:02 -------------------------------------------------------------------------------- Wound Assessment Details Patient Name: Date of Service: MYSON, Dennis Wyatt 08/29/2019 2:45 PM Medical Record [id number] Patient Account Number: [id number] Date of Birth/Sex: Treating RN: October 26, 1960 (59 y.o. [name] Primary Care Jeimy Bickert: [name] Other Clinician: Referring Fletcher Ostermiller: Treating Rida Loudin/Extender:[name], [name], [name] Weeks in Treatment: 4 Wound Status Wound Number: 3 Primary Etiology: Venous Leg Ulcer Wound Location: Right, Posterior Lower Leg Secondary Diabetic Wound/Ulcer of the Lower Etiology: Extremity Wounding Event: Gradually Appeared Wound Status: Open Date Acquired: 07/11/2019 Weeks Of Treatment: 4 Clustered Wound: No Wound Measurements Length: (cm) 1 % Reduct Width: (cm) 1.3 % Reduct Depth: (cm) 0.1 Area: (cm) 1.021 Volume: (cm) 0.102 Wound Description Classification: Full Thickness Without Exposed Support Structures ion in Area: 71.1%  ion in Volume: 71.1% Treatment  Notes Wound #3 (Right, Posterior Lower Leg) 1. Cleanse With Wound Cleanser Soap and water 2. Periwound Care Moisturizing lotion TCA Cream 3. Primary Dressing Applied Hydrofera Blue 4. Secondary Dressing ABD Pad Kerramax/Xtrasorb 6. Support Layer Applied 3 layer compression wrap Notes primary dressing hydrofera blue classic. netting. Electronic Signature(s) Signed: 08/29/2019 6:12:41 PM By: [name] Entered By: [name] on 08/29/2019 15:45:29 -------------------------------------------------------------------------------- Vitals Details Patient Name: Date of Service: PADEN, Dennis Wyatt 08/29/2019 2:45 PM Medical Record [id number] Patient Account Number: [id number] Date of Birth/Sex: Treating RN: 19-Sep-1960 (59 y.o. [name] Primary Care Raja Caputi: [name] Other Clinician: Referring Amya Hlad: Treating Demetrice Combes/Extender:[name], [name], [name] Weeks in Treatment: 4 Vital Signs Time Taken: 15:40 Temperature (F): 98.2 Height (in): 70 Pulse (bpm): 75 Weight (lbs): 302 Respiratory Rate (breaths/min): 19 Body Mass Index (BMI): 43.3 Blood Pressure (mmHg): 158/77 Reference Range: 80 - 120 mg / dl Electronic Signature(s) Signed: 08/29/2019 6:12:41 PM By: [name] Entered By: [name] on 08/29/2019 15:44:21

## 2019-08-30 DIAGNOSIS — E1165 Type 2 diabetes mellitus with hyperglycemia: Secondary | ICD-10-CM | POA: Diagnosis not present

## 2019-08-30 DIAGNOSIS — M25552 Pain in left hip: Secondary | ICD-10-CM | POA: Diagnosis not present

## 2019-08-30 DIAGNOSIS — M545 Low back pain: Secondary | ICD-10-CM | POA: Diagnosis not present

## 2019-08-30 DIAGNOSIS — Z79899 Other long term (current) drug therapy: Secondary | ICD-10-CM | POA: Diagnosis not present

## 2019-08-30 DIAGNOSIS — F17210 Nicotine dependence, cigarettes, uncomplicated: Secondary | ICD-10-CM | POA: Diagnosis not present

## 2019-08-30 DIAGNOSIS — M25561 Pain in right knee: Secondary | ICD-10-CM | POA: Diagnosis not present

## 2019-08-30 DIAGNOSIS — E1140 Type 2 diabetes mellitus with diabetic neuropathy, unspecified: Secondary | ICD-10-CM | POA: Diagnosis not present

## 2019-09-05 ENCOUNTER — Other Ambulatory Visit: Payer: Self-pay

## 2019-09-05 ENCOUNTER — Encounter (HOSPITAL_BASED_OUTPATIENT_CLINIC_OR_DEPARTMENT_OTHER): Payer: PPO | Admitting: Internal Medicine

## 2019-09-05 DIAGNOSIS — L97212 Non-pressure chronic ulcer of right calf with fat layer exposed: Secondary | ICD-10-CM | POA: Diagnosis not present

## 2019-09-05 DIAGNOSIS — I87311 Chronic venous hypertension (idiopathic) with ulcer of right lower extremity: Secondary | ICD-10-CM | POA: Diagnosis not present

## 2019-09-05 DIAGNOSIS — E11622 Type 2 diabetes mellitus with other skin ulcer: Secondary | ICD-10-CM | POA: Diagnosis not present

## 2019-09-05 DIAGNOSIS — L97812 Non-pressure chronic ulcer of other part of right lower leg with fat layer exposed: Secondary | ICD-10-CM | POA: Diagnosis not present

## 2019-09-08 DIAGNOSIS — M129 Arthropathy, unspecified: Secondary | ICD-10-CM | POA: Diagnosis not present

## 2019-09-08 DIAGNOSIS — E119 Type 2 diabetes mellitus without complications: Secondary | ICD-10-CM | POA: Diagnosis not present

## 2019-09-08 DIAGNOSIS — E7800 Pure hypercholesterolemia, unspecified: Secondary | ICD-10-CM | POA: Diagnosis not present

## 2019-09-08 DIAGNOSIS — Z79899 Other long term (current) drug therapy: Secondary | ICD-10-CM | POA: Diagnosis not present

## 2019-09-08 DIAGNOSIS — I10 Essential (primary) hypertension: Secondary | ICD-10-CM | POA: Diagnosis not present

## 2019-09-08 DIAGNOSIS — E559 Vitamin D deficiency, unspecified: Secondary | ICD-10-CM | POA: Diagnosis not present

## 2019-09-08 DIAGNOSIS — F17210 Nicotine dependence, cigarettes, uncomplicated: Secondary | ICD-10-CM | POA: Diagnosis not present

## 2019-09-08 DIAGNOSIS — R5383 Other fatigue: Secondary | ICD-10-CM | POA: Diagnosis not present

## 2019-09-08 DIAGNOSIS — Z1159 Encounter for screening for other viral diseases: Secondary | ICD-10-CM | POA: Diagnosis not present

## 2019-09-08 NOTE — Progress Notes (Signed)
Dennis Wyatt, Dennis Wyatt ([id number]) Visit Report for 09/05/2019 HPI Details Patient Name: Date of Service: Dennis Wyatt, Dennis Wyatt 09/05/2019 3:45 PM Medical Record Number:[id number] Patient Account Number: [id number] Date of Birth/Sex: Treating RN: Mar 16, 1961 (59 y.o. Dennis Wyatt Primary Care Provider: Kaleen Wyatt Other Clinician: Referring Provider: Treating Provider/Extender:Dennis Wyatt, Dennis Wyatt, Dennis Wyatt Weeks in Treatment: 5 History of Present Illness HPI Description: ADMISSION 08/01/2019 This is a 59 year old man who is essentially self-referred although he gets his primary care at [facility]. We did not have any information from them. He is here for review of 3 wounds on the right lower leg. He states about a month and a half ago he dropped his cell phone on the right anterior leg creating a wound. More recently he has had 2 blisters come out 1 medially just above the lateral malleolus and one on the posterior calf. All of these are roughly the same condition however. He has been applying some form of ointment were not really sure what this is. He does not have a wound history. 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 ABI in our clinic was 0.95 1/25; the patient has 3 wounds on the right leg. One is on the right anterior 1 just above the lateral malleolus and one on the posterior calf. We have been using Iodoflex under compression. The base of these wounds appears better 2/1; still the 3 wounds one on the right anterior 1 on the lateral and 1 on the posterior calf we have been using Iodoflex under compression making some general improvement in the wound bed. The patient went for his reflux studies on 1/28. This showed reflux in the common femoral vein and greater than 1 second  in the greater saphenous vein at the saphenofemoral junction but nothing in the more superficial veins below the knee either greater or lesser saphenous. I therefore I do not think he is going to be M amenable for an ablation. The report did note an aging consistent deep venous thrombosis involving the right popliteal artery. This led me to research [place] link. He had a similar finding on a an arterial duplex study in 2014 at which time he was recovering from a subarachnoid hemorrhage. This was seen by the rehab team who admitted him to rehab after the surgery for an anterior cerebral aneurysm. By that time it was felt safe to anticoagulate him and he was put on Xarelto and continued that according to the patient to the last year or 2 when it was stopped and he is only on aspirin. 2/8; all of his wounds appear to be making some progress. We have been using Iodoflex. Base of the wounds appear to be cleaning up nicely but still requiring debridement. We have tried to put Apligraf through his insurance related to deep punched out chronic venous insufficiency wounds and a type II diabetic. We will see if this is affordable 2/15; we are using Iodoflex on the wound areas. Everything looks like it is getting better the anterior and posterior wounds on the right contracted however the area on the lateral side is about the same size. We did not hear anything about Apligraf. 2/22; patient has a $200 per application out-of-pocket co-pay for Apligraf. This was beyond his means. He has good edema control Electronic Signature(s) Signed: 09/05/2019 5:55:43 PM By: Dennis Najjar MD Entered By: Dennis Wyatt  on 09/05/2019 17:46:47 -------------------------------------------------------------------------------- Physical Exam Details Patient Name: Date of Service: Dennis Wyatt, Dennis Wyatt 09/05/2019 3:45 PM Medical Record Number:[id number] Patient Account Number: [id number] Date of Birth/Sex: Treating  RN: [birth year] (59 y.o. Dennis Wyatt Primary Care Provider: Kaleen Wyatt Other Clinician: Referring Provider: Treating Provider/Extender:Dennis Wyatt, Dennis Wyatt, Dennis Wyatt Weeks in Treatment: 5 Constitutional Patient is hypertensive.. Pulse regular and within target range for patient.Marland Kitchen Respirations regular, non-labored and within target range.. Temperature is normal and within the target range for the patient.Marland Kitchen Appears in no distress. Respiratory work of breathing is normal. Cardiovascular Pedal pulses are palpable. We have good edema control. Integumentary (Hair, Skin) No erythema around the wound. Notes Wound exam Major wound on the lower leg anteriorly laterally and posteriorly. All these wounds are measuring smaller. No mechanical debridement is necessary and the wound beds look healthy although there is raised edges around the anterior lateral wound that may require debridement Electronic Signature(s) Signed: 09/05/2019 5:55:43 PM By: Dennis Najjar MD Entered By: Dennis Wyatt on 09/05/2019 17:48:24 -------------------------------------------------------------------------------- Physician Orders Details Patient Name: Date of Service: Dennis Wyatt, Dennis Wyatt 09/05/2019 3:45 PM Medical Record [id number] Patient Account Number: [id number] Date of Birth/Sex: Treating RN: 1960/07/18 (59 y.o. Dennis Wyatt Primary Care Provider: Kaleen Wyatt Other Clinician: Referring Provider: Treating Provider/Extender:Dennis Wyatt, Dennis Wyatt, Dennis Wyatt Weeks in Treatment: 5 Verbal / Phone Orders: No Diagnosis Coding ICD-10 Coding Code Description [phone] Non-pressure chronic ulcer of other part of right lower leg with fat layer exposed I87.331 Chronic venous hypertension (idiopathic) with ulcer and inflammation of right lower extremity E11.622 Type 2 diabetes mellitus with other skin ulcer E11.42 Type 2 diabetes mellitus with diabetic  polyneuropathy Follow-up Appointments Return Appointment in 1 week. Dressing Change Frequency Do not change entire dressing for one week. Skin Barriers/Peri-Wound Care Moisturizing lotion TCA Cream or Ointment - mixed with lotion Wound Cleansing May shower with protection. - may use cast protector Primary Wound Dressing Wound #1 Right,Anterior Lower Leg Hydrofera Blue - Classic Wound #2 Right,Lateral Lower Leg Hydrofera Blue - Classic Wound #3 Right,Posterior Lower Leg Hydrofera Blue - Classic Secondary Dressing Wound #1 Right,Anterior Lower Leg ABD pad Zetuvit or Kerramax Wound #2 Right,Lateral Lower Leg ABD pad Zetuvit or Kerramax Wound #3 Right,Posterior Lower Leg ABD pad Zetuvit or Kerramax Edema Control 3 Layer Compression System - Right Lower Extremity Avoid standing for long periods of time Elevate legs to the level of the heart or above for 30 minutes daily and/or when sitting, a frequency of: - throughout the day Exercise regularly Electronic Signature(s) Signed: 09/05/2019 5:55:43 PM By: Dennis Najjar MD Signed: 09/08/2019 8:56:01 AM By: Zandra Abts RN, BSN Entered By: Zandra Abts on 09/05/2019 17:00:47 -------------------------------------------------------------------------------- Problem List Details Patient Name: Date of Service: Dennis Wyatt, Dennis Wyatt 09/05/2019 3:45 PM Medical Record [id number] Patient Account Number: [id number] Date of Birth/Sex: Treating RN: [birth year] (58 y.o. Dennis Wyatt Primary Care Provider: Kaleen Wyatt Other Clinician: Referring Provider: Treating Provider/Extender:Breydan Shilling[place], Dennis Wyatt, Dennis Wyatt Weeks in Treatment: 5 Active Problems ICD-10 Evaluated Encounter Code Description Active Date Today Diagnosis [phone] Non-pressure chronic ulcer of other part of right lower 08/01/2019 No Yes leg with fat layer exposed I87.331 Chronic venous hypertension (idiopathic) with ulcer 08/01/2019 No Yes and  inflammation of right lower extremity E11.622 Type 2 diabetes mellitus with other skin ulcer 08/01/2019 No Yes E11.42 Type 2 diabetes mellitus with diabetic polyneuropathy 08/01/2019 No Yes Inactive Problems Resolved Problems Electronic Signature(s) Signed: 09/05/2019 5:55:43 PM By: Dennis Najjar MD Entered By: Dennis Wyatt on 09/05/2019 17:45:52 --------------------------------------------------------------------------------  Progress Note Details Patient Name: Date of Service: Dennis Wyatt, Dennis Wyatt 09/05/2019 3:45 PM Medical Record Number:[id number] Patient Account Number: [id number] Date of Birth/Sex: Treating RN: Nov 24, 1960 (59 y.o. Dennis Wyatt Primary Care Provider: Kaleen Wyatt Other Clinician: Referring Provider: Treating Provider/Extender:Dominque Marlin, Dennis Wyatt, Dennis Wyatt Weeks in Treatment: 5 Subjective History of Present Illness (HPI) ADMISSION 08/01/2019 This is a 59 year old man who is essentially self-referred although he gets his primary care at [facility]. We did not have any information from them. He is here for review of 3 wounds on the right lower leg. He states about a month and a half ago he dropped his cell phone on the right anterior leg creating a wound. More recently he has had 2 blisters come out 1 medially just above the lateral malleolus and one on the posterior calf. All of these are roughly the same condition however. He has been applying some form of ointment were not really sure what this is. He does not have a wound history. 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 ABI in our clinic was 0.95 1/25; the patient has 3 wounds on the right leg. One is on the right anterior 1 just above the lateral malleolus and one on the posterior calf. We have  been using Iodoflex under compression. The base of these wounds appears better 2/1; still the 3 wounds one on the right anterior 1 on the lateral and 1 on the posterior calf we have been using Iodoflex under compression making some general improvement in the wound bed. The patient went for his reflux studies on 1/28. This showed reflux in the common femoral vein and greater than 1 second in the greater saphenous vein at the saphenofemoral junction but nothing in the more superficial veins below the knee either greater or lesser saphenous. I therefore I do not think he is going to be M amenable for an ablation. The report did note an aging consistent deep venous thrombosis involving the right popliteal artery. This led me to research  link. He had a similar finding on a an arterial duplex study in 2014 at which time he was recovering from a subarachnoid hemorrhage. This was seen by the rehab team who admitted him to rehab after the surgery for an anterior cerebral aneurysm. By that time it was felt safe to anticoagulate him and he was put on Xarelto and continued that according to the patient to the last year or 2 when it was stopped and he is only on aspirin. 2/8; all of his wounds appear to be making some progress. We have been using Iodoflex. Base of the wounds appear to be cleaning up nicely but still requiring debridement. We have tried to put Apligraf through his insurance related to deep punched out chronic venous insufficiency wounds and a type II diabetic. We will see if this is affordable 2/15; we are using Iodoflex on the wound areas. Everything looks like it is getting better the anterior and posterior wounds on the right contracted however the area on the lateral side is about the same size. We did not hear anything about Apligraf. 2/22; patient has a $200 per application out-of-pocket co-pay for Apligraf. This was beyond his means. He has good edema  control Objective Constitutional Patient is hypertensive.. Pulse regular and within target range for patient.Marland Kitchen Respirations regular, non-labored and within target  range.. Temperature is normal and within the target range for the patient.Marland Kitchen Appears in no distress. Vitals Time Taken: 4:11 PM, Height: 70 in, Weight: 302 lbs, BMI: 43.3, Temperature: 98.5 F, Pulse: 58 bpm, Respiratory Rate: 18 breaths/min, Blood Pressure: 153/60 mmHg. Respiratory work of breathing is normal. Cardiovascular Pedal pulses are palpable. We have good edema control. General Notes: Wound exam ooMajor wound on the lower leg anteriorly laterally and posteriorly. All these wounds are measuring smaller. No mechanical debridement is necessary and the wound beds look healthy although there is raised edges around the anterior lateral wound that may require debridement Integumentary (Hair, Skin) No erythema around the wound. Wound #1 status is Open. Original cause of wound was Trauma. The wound is located on the Right,Anterior Lower Leg. The wound measures 2.8cm length x 2.5cm width x 0.2cm depth; 5.498cm^2 area and 1.1cm^3 volume. There is Fat Layer (Subcutaneous Tissue) Exposed exposed. There is no tunneling or undermining noted. There is a small amount of serosanguineous drainage noted. The wound margin is flat and intact. There is large (67-100%) red granulation within the wound bed. There is a small (1-33%) amount of necrotic tissue within the wound bed including Adherent Slough. Wound #2 status is Open. Original cause of wound was Gradually Appeared. The wound is located on the Right,Lateral Lower Leg. The wound measures 2.3cm length x 1.6cm width x 0.2cm depth; 2.89cm^2 area and 0.578cm^3 volume. There is Fat Layer (Subcutaneous Tissue) Exposed exposed. There is no tunneling or undermining noted. There is a small amount of serosanguineous drainage noted. The wound margin is flat and intact. There is medium (34-66%)  red granulation within the wound bed. There is a medium (34-66%) amount of necrotic tissue within the wound bed including Adherent Slough. Wound #3 status is Open. Original cause of wound was Gradually Appeared. The wound is located on the Right,Posterior Lower Leg. The wound measures 1cm length x 0.9cm width x 0.1cm depth; 0.707cm^2 area and 0.071cm^3 volume. There is Fat Layer (Subcutaneous Tissue) Exposed exposed. There is no tunneling or undermining noted. There is a medium amount of serosanguineous drainage noted. There is large (67-100%) red, pink granulation within the wound bed. There is a small (1-33%) amount of necrotic tissue within the wound bed including Adherent Slough. Assessment Active Problems ICD-10 Non-pressure chronic ulcer of other part of right lower leg with fat layer exposed Chronic venous hypertension (idiopathic) with ulcer and inflammation of right lower extremity Type 2 diabetes mellitus with other skin ulcer Type 2 diabetes mellitus with diabetic polyneuropathy Procedures Wound #1 Pre-procedure diagnosis of Wound #1 is a Venous Leg Ulcer located on the Right,Anterior Lower Leg . There was a Three Layer Compression Therapy Procedure by Zandra Abts, RN. Post procedure Diagnosis Wound #1: Same as Pre-Procedure Wound #2 Pre-procedure diagnosis of Wound #2 is a Venous Leg Ulcer located on the Right,Lateral Lower Leg . There was a Three Layer Compression Therapy Procedure by Zandra Abts, RN. Post procedure Diagnosis Wound #2: Same as Pre-Procedure Wound #3 Pre-procedure diagnosis of Wound #3 is a Venous Leg Ulcer located on the Right,Posterior Lower Leg . There was a Three Layer Compression Therapy Procedure by Zandra Abts, RN. Post procedure Diagnosis Wound #3: Same as Pre-Procedure Plan Follow-up Appointments: Return Appointment in 1 week. Dressing Change Frequency: Do not change entire dressing for one week. Skin Barriers/Peri-Wound  Care: Moisturizing lotion TCA Cream or Ointment - mixed with lotion Wound Cleansing: May shower with protection. - may use cast protector Primary Wound Dressing: Wound #1  Right,Anterior Lower Leg: Hydrofera Blue - Classic Wound #2 Right,Lateral Lower Leg: Hydrofera Blue - Classic Wound #3 Right,Posterior Lower Leg: Hydrofera Blue - Classic Secondary Dressing: Wound #1 Right,Anterior Lower Leg: ABD pad Zetuvit or Kerramax Wound #2 Right,Lateral Lower Leg: ABD pad Zetuvit or Kerramax Wound #3 Right,Posterior Lower Leg: ABD pad Zetuvit or Kerramax Edema Control: 3 Layer Compression System - Right Lower Extremity Avoid standing for long periods of time Elevate legs to the level of the heart or above for 30 minutes daily and/or when sitting, a frequency of: - throughout the day Exercise regularly 1. I change the primary dressing to Hydrofera Blue 2. Advanced treatment products are not going to be an option because of co-pay expense 3. We will follow of the dimensions of the wound anteriorly and laterally carefully. If these do not come down in surface area or continue to come down then debridement of the edges of the wound may be necessary which will be difficult. The posterior wound does not have any such rolled Electronic Signature(s) Signed: 09/05/2019 5:55:43 PM By: Linton Ham MD Entered By: Linton Ham on 09/05/2019 17:49:25 -------------------------------------------------------------------------------- SuperBill Details Patient Name: Date of Service: Dennis Wyatt, Dennis Wyatt 09/05/2019 Medical Record [id number] Patient Account Number: [id number] Date of Birth/Sex: Treating RN: 1960-09-06 (59 y.o. [name] Primary Care Provider: [name] Other Clinician: Referring Provider: Treating Provider/Extender:Olamide Lahaie, [name], [name] Weeks in Treatment: 5 Diagnosis Coding ICD-10 Codes Code Description [phone] Non-pressure chronic ulcer  of other part of right lower leg with fat layer exposed I87.331 Chronic venous hypertension (idiopathic) with ulcer and inflammation of right lower extremity E11.622 Type 2 diabetes mellitus with other skin ulcer E11.42 Type 2 diabetes mellitus with diabetic polyneuropathy Facility Procedures CPT4 Code Description: [id number] (Facility Use Only) [phone] - [facility] LWR RT LEG Modifier: Quantity: 1 Physician Procedures CPT4 Code Description: [id number] - WC PHYS LEVEL 3 - EST PT ICD-10 Diagnosis Description X48.016 Non-pressure chronic ulcer of other part of right lower le I87.331 Chronic venous hypertension (idiopathic) with ulcer and in lower extremity E11.622  Type 2 diabetes mellitus with other skin ulcer Modifier: g with fat layer flammation of ri Quantity: 1 exposed ght Electronic Signature(s) Signed: 09/06/2019 7:48:35 AM By: Linton Ham MD Signed: 09/08/2019 8:56:01 AM By: [name] RN, BSN Previous Signature: 09/05/2019 5:55:43 PM Version By: Linton Ham MD Entered By: [name] on 09/05/2019 18:39:04

## 2019-09-08 NOTE — Progress Notes (Signed)
Dennis Wyatt, Dennis Wyatt ([id number]) Visit Report for 09/05/2019 Arrival Information Details Patient Name: Date of Service: Dennis Wyatt, Dennis Wyatt 09/05/2019 3:45 PM Medical Record [id number] Patient Account Number: [id number] Date of Birth/Sex: Treating RN: 05/24/1961 (59 y.o. Dennis Wyatt Primary Care Dennis Wyatt: Dennis Wyatt Other Wyatt: Referring Dennis Wyatt: Treating Dennis Wyatt/Extender:Dennis Wyatt: 5 Visit Information History Since Last Visit Cane Added or deleted any medications: No Patient Arrived: 16:10 Any new allergies or adverse reactions: No Arrival Time: Had a fall or experienced change in No Accompanied By: self None activities of daily living that may affect Transfer Assistance: risk of falls: Patient Identification Verified: Yes Signs or symptoms of abuse/neglect since last No Secondary Verification Process Completed: Yes visito Patient Requires Transmission-Based No Hospitalized since last visit: No Precautions: Implantable device outside of the clinic excluding No Patient Has Alerts: No cellular tissue based products placed in the center since last visit: Has Dressing in Place as Prescribed: Yes Has Compression in Place as Prescribed: Yes Pain Present Now: No Electronic Signature(s) Signed: 09/05/2019 5:29:42 PM By: Dennis Wyatt Entered By: Dennis Wyatt on 09/05/2019 16:11:53 -------------------------------------------------------------------------------- Compression Therapy Details Patient Name: Date of Service: Dennis Wyatt, Dennis Wyatt 09/05/2019 3:45 PM Medical Record [id number] Patient Account Number: [id number] Date of Birth/Sex: Treating RN: 1960-08-22 (59 y.o. Dennis Wyatt Primary Care Dennis Wyatt: Dennis Wyatt Other Wyatt: Referring Dennis Wyatt: Treating Dennis Wyatt/Extender:Dennis Wyatt: 5 Compression Therapy Performed for Wound Wound #1  Right,Anterior Lower Leg Assessment: Performed By: Wyatt Dennis Hurst, RN Compression Type: Three Layer Post Procedure Diagnosis Same as Pre-procedure Electronic Signature(s) Signed: 09/08/2019 8:56:01 AM By: Dennis Hurst RN, Wyatt Entered By: Dennis Wyatt on 09/05/2019 17:01:28 -------------------------------------------------------------------------------- Compression Therapy Details Patient Name: Date of Service: [name], [name] 09/05/2019 3:45 PM Medical Record [id number] Patient Account Number: [id number] Date of Birth/Sex: Treating RN: 12-19-1960 (59 y.o. Dennis Wyatt Primary Care Dennis Wyatt: Dennis Wyatt Other Wyatt: Referring Nalee Lightle: Treating Dennis Wyatt/Extender:Dennis Wyatt: 5 Compression Therapy Performed for Wound Wound #2 Right,Lateral Lower Leg Assessment: Performed By: Wyatt Dennis Hurst, RN Compression Type: Three Layer Post Procedure Diagnosis Same as Pre-procedure Electronic Signature(s) Signed: 09/08/2019 8:56:01 AM By: Dennis Hurst RN, Wyatt Entered By: Dennis Wyatt on 09/05/2019 17:01:28 -------------------------------------------------------------------------------- Compression Therapy Details Patient Name: Date of Service: Dennis Wyatt, Dennis Wyatt 09/05/2019 3:45 PM Medical Record [id number] Patient Account Number: [id number] Date of Birth/Sex: Treating RN: 1961-03-25 (59 y.o. Dennis Wyatt Primary Care Dennis Wyatt: Dennis Wyatt Other Wyatt: Referring Dennis Wyatt: Treating Dennis Wyatt/Extender:Dennis Wyatt: 5 Compression Therapy Performed for Wound Wound #3 Right,Posterior Lower Leg Assessment: Performed By: Wyatt Dennis Hurst, RN Compression Type: Three Layer Post Procedure Diagnosis Same as Pre-procedure Electronic Signature(s) Signed: 09/08/2019 8:56:01 AM By: Dennis Hurst RN, Wyatt Entered By: Dennis Wyatt on  09/05/2019 17:01:29 -------------------------------------------------------------------------------- Encounter Discharge Information Details Patient Name: Date of Service: Dennis Wyatt, Dennis Wyatt 09/05/2019 3:45 PM Medical Record [id number] Patient Account Number: [id number] Date of Birth/Sex: Treating RN: 1961/03/21 (59 y.o. Dennis Wyatt Primary Care Dennis Wyatt: Dennis Wyatt Other Wyatt: Referring Dennis Wyatt: Treating Dennis Wyatt/Extender:Dennis Wyatt: 5 Encounter Discharge Information Items Discharge Condition: Stable Ambulatory Status: Cane Discharge Destination: Home Transportation: Private Auto Accompanied By: self Schedule Follow-up Appointment: Yes Clinical Summary of Care: Electronic Signature(s) Signed: 09/05/2019 5:30:41 PM By: Dennis Wyatt Entered By: Dennis Wyatt on 09/05/2019 17:10:17 -------------------------------------------------------------------------------- Lower Extremity Assessment Details Patient Name: Date of Service: Dennis Wyatt, Dennis Wyatt 09/05/2019 3:45 PM Medical Record [id number] Patient  Account Number: [id number] Date of Birth/Sex: Treating RN: Dennis Wyatt Primary Care Dennis Wyatt: Referring Dennis Wyatt: Treating Dennis Wyatt/Extender:Dennis Wyatt: 5 Edema Assessment Assessed: [Left: No] [Right: No] Edema: [Left: Ye] [Right: s] Calf Left: Right: Point of Measurement: 36 cm From Medial Instep cm 39 cm Ankle Left: Right: Point of Measurement: 9 cm From Medial Instep cm 27 cm Vascular Assessment Pulses: Dorsalis Pedis Palpable: [Right:Yes] Electronic Signature(s) Signed: 09/05/2019 5:29:42 PM By: Dennis Wyatt Entered By: Dennis Wyatt on 09/05/2019 16:13:23 -------------------------------------------------------------------------------- Multi Wound Chart Details Patient Name: Date of  Service: Dennis Wyatt, Dennis Wyatt 09/05/2019 3:45 PM Medical Record [id number] Patient Account Number: [id number] Date of Birth/Sex: Treating RN: 04/05/1961 (59 y.o. Dennis Wyatt Primary Care Dennis Wyatt: Dennis Wyatt Other Wyatt: Referring Chane Cowden: Treating Catalyna Reilly/Extender:Dennis Wyatt: 5 Vital Signs Height(in): 23 Pulse(bpm): 25 Weight(lbs): 302 Blood Pressure(mmHg): 153/60 Body Mass Index(BMI): 43 Temperature(F): 98.5 Respiratory 18 Rate(breaths/min): Photos: [1:No Photos] [2:No Photos] [3:No Photos] Wound Location: [1:Right Lower Leg - Anterior Right Lower Leg - Lateral Right Lower Leg - Posterior] Wounding Event: [1:Trauma] [2:Gradually Appeared] [3:Gradually Appeared] Primary Etiology: [1:Venous Leg Ulcer] [2:Venous Leg Ulcer] [3:Venous Leg Ulcer] Secondary Etiology: [1:Diabetic Wound/Ulcer of the Diabetic Wound/Ulcer of the Diabetic Wound/Ulcer of the Lower Extremity] [2:Lower Extremity] [3:Lower Extremity] Comorbid History: [1:Deep Vein Thrombosis, Hypertension, Type II Diabetes, Osteoarthritis] [2:Deep Vein Thrombosis, Hypertension, Type II Diabetes, Osteoarthritis] [3:Deep Vein Thrombosis, Hypertension, Type II Diabetes, Osteoarthritis] Date Acquired: [1:06/14/2019] [2:07/11/2019] [3:07/11/2019] Weeks of Wyatt: [1:5] [2:5] [3:5] Wound Status: [1:Open] [2:Open] [3:Open] Measurements L x W x D 2.8x2.5x0.2 [2:2.3x1.6x0.2] [3:1x0.9x0.1] (cm) Area (cm) : [1:5.498] [2:2.89] [9:1.791] Volume (cm) : [1:1.1] [2:0.578] [3:0.071] % Reduction in Area: [1:32.40%] [2:12.40%] [3:80.00%] % Reduction in Volume: 32.40% [2:41.60%] [3:79.90%] Classification: [1:Full Thickness Without Exposed Support Structures Exposed Support Structures Exposed Support Structures] [2:Full Thickness Without] [3:Full Thickness Without] Exudate Amount: [1:Small] [2:Small] [3:Medium] Exudate Type: [1:Serosanguineous] [2:Serosanguineous]  [3:Serosanguineous] Exudate Color: [1:red, brown] [2:red, brown] [3:red, brown] Wound Margin: [1:Flat and Intact] [2:Flat and Intact] [3:N/A] Granulation Amount: [1:Large (67-100%)] [2:Medium (34-66%)] [3:Large (67-100%)] Granulation Quality: [1:Red] [2:Red] [3:Red, Pink] Necrotic Amount: [1:Small (1-33%)] [2:Medium (34-66%)] [3:Small (1-33%)] Exposed Structures: [1:Fat Layer (Subcutaneous Tissue) Exposed: Yes Fascia: No Tendon: No Muscle: No Joint: No Bone: No] [2:Fat Layer (Subcutaneous Tissue) Exposed: Yes Fascia: No Tendon: No Muscle: No Joint: No Bone: No] [3:Fat Layer (Subcutaneous Tissue) Exposed: Yes  Fascia: No Tendon: No Muscle: No Joint: No Bone: No] Epithelialization: [1:Small (1-33%) Compression Therapy] [2:None Compression Therapy] [3:None Compression Therapy] Wyatt Notes Wound #1 (Right, Anterior Lower Leg) 1. Cleanse With Wound Cleanser Soap and water 2. Periwound Care Moisturizing lotion TCA Cream 3. Primary Dressing Applied Hydrofera Blue 4. Secondary Dressing ABD Pad Kerramax/Xtrasorb 6. Support Layer Applied 3 layer compression wrap Notes netting. Wound #2 (Right, Lateral Lower Leg) 1. Cleanse With Wound Cleanser Soap and water 2. Periwound Care Moisturizing lotion TCA Cream 3. Primary Dressing Applied Hydrofera Blue 4. Secondary Dressing ABD Pad Kerramax/Xtrasorb 6. Support Layer Applied 3 layer compression wrap Notes netting. Wound #3 (Right, Posterior Lower Leg) 1. Cleanse With Wound Cleanser Soap and water 2. Periwound Care Moisturizing lotion TCA Cream 3. Primary Dressing Applied Hydrofera Blue 4. Secondary Dressing ABD Pad Kerramax/Xtrasorb 6. Support Layer Applied 3 layer compression wrap Notes netting. Electronic Signature(s) Signed: 09/05/2019 5:55:43 PM By: Linton Ham MD Signed: 09/08/2019 8:56:01 AM By: Dennis Hurst RN, Wyatt Entered By: Linton Ham on 09/05/2019  17:46:06 --------------------------------------------------------------------------------  Multi-Disciplinary Care Plan Details Patient Name: Date of Service: Dennis Wyatt, Dennis Wyatt 09/05/2019 3:45 PM Medical Record [id number] Patient Account Number: [id number] Date of Birth/Sex: Treating RN: October 02, 1960 (59 y.o. Dennis Wyatt Primary Care Quandra Fedorchak: Dennis Wyatt Other Wyatt: Referring Latangela Mccomas: Treating Cortnie Ringel/Extender:Dennis Wyatt: 5 Active Inactive Venous Leg Ulcer Nursing Diagnoses: Knowledge deficit related to disease process and management Potential for venous Insuffiency (use before diagnosis confirmed) Goals: Patient will maintain optimal edema control Date Initiated: 08/01/2019 Target Resolution Date: 09/30/2019 Goal Status: Active Patient/caregiver will verbalize understanding of disease process and disease management Date Initiated: 08/01/2019 Date Inactivated: 08/29/2019 Target Resolution Date: 09/02/2019 Goal Status: Met Interventions: Assess peripheral edema status every visit. Compression as ordered Provide education on venous insufficiency Notes: Wound/Skin Impairment Nursing Diagnoses: Impaired tissue integrity Knowledge deficit related to smoking impact on wound healing Knowledge deficit related to ulceration/compromised skin integrity Goals: Patient/caregiver will verbalize understanding of skin care regimen Date Initiated: 08/01/2019 Target Resolution Date: 09/30/2019 Goal Status: Active Interventions: Assess patient/caregiver ability to obtain necessary supplies Assess patient/caregiver ability to perform ulcer/skin care regimen upon admission and as needed Assess ulceration(s) every visit Provide education on ulcer and skin care Notes: Electronic Signature(s) Signed: 09/08/2019 8:56:01 AM By: Dennis Hurst RN, Wyatt Entered By: Dennis Wyatt on 09/05/2019  18:38:21 -------------------------------------------------------------------------------- Pain Assessment Details Patient Name: Date of Service: Dennis Wyatt, Dennis Wyatt 09/05/2019 3:45 PM Medical Record [id number] Patient Account Number: [id number] Date of Birth/Sex: Treating RN: [birth year] (59 y.o. Dennis Wyatt Primary Care Tinika Bucknam: Dennis Wyatt Other Wyatt: Referring Rayvn Rickerson: Treating Helvi Royals/Extender:Dennis Wyatt: 5 Active Problems Location of Pain Severity and Description of Pain Patient Has Paino No Site Locations Pain Management and Medication Current Pain Management: Electronic Signature(s) Signed: 09/05/2019 5:29:42 PM By: Dennis Wyatt Entered By: Dennis Wyatt on 09/05/2019 16:12:41 -------------------------------------------------------------------------------- Patient/Caregiver Education Details Patient Name: Date of Service: Dennis Wyatt, Dennis Wyatt 2/22/2021andnbsp3:45 PM Medical Record [id number] Patient Account Number: [id number] Date of Birth/Gender: Treating RN: 17-Jun-1961 (59 y.o. Dennis Wyatt Primary Care Physician: Dennis Wyatt Other Wyatt: Referring Physician: Treating Physician/Extender:Dennis Wyatt: 5 Education Assessment Education Provided To: Patient Education Topics Provided Wound/Skin Impairment: Methods: Explain/Verbal Responses: State content correctly Electronic Signature(s) Signed: 09/08/2019 8:56:01 AM By: Dennis Hurst RN, Wyatt Entered By: Dennis Wyatt on 09/05/2019 18:38:45 -------------------------------------------------------------------------------- Wound Assessment Details Patient Name: Date of Service: Dennis Wyatt, Dennis Wyatt 09/05/2019 3:45 PM Medical Record [id number] Patient Account Number: [id number] Date of Birth/Sex: Treating RN: 06/17/1961 (59 y.o. Dennis Wyatt Primary Care Beyla Loney:  Dennis Wyatt Other Wyatt: Referring Jerrin Recore: Treating Kerriann Kamphuis/Extender:Dennis Wyatt: 5 Wound Status Wound Number: 1 Primary Venous Leg Ulcer Etiology: Wound Location: Right Lower Leg - Anterior Secondary Diabetic Wound/Ulcer of the Lower Wounding Event: Trauma Etiology: Extremity Etiology: Extremity Date Acquired: 06/14/2019 Wound Open Weeks Of Wyatt: 5 Status: Clustered Wound: No Comorbid Deep Vein Thrombosis, Hypertension, Type History: II Diabetes, Osteoarthritis Photos Wound Measurements Length: (cm) 2.8 % Reduct Width: (cm) 2.5 % Reduct Depth: (cm) 0.2 Epitheli Area: (cm) 5.498 Tunneli Volume: (cm) 1.1 Undermi Wound Description Classification: Full Thickness Without Exposed Support Foul Odo Structures Slough/F Wound Flat and Intact Margin: Exudate Small Amount: Exudate Serosanguineous Type: Exudate red, brown Color: Wound Bed Granulation Amount: Large (67-100%) Granulation Quality: Red Fascia E Necrotic Amount: Small (1-33%) Fat Laye Necrotic Quality: Adherent Slough Tendon E Muscle E Joint Ex Bone Exp r After Cleansing: No ibrino Yes Exposed Structure xposed: No r (  Subcutaneous Tissue) Exposed: Yes xposed: No xposed: No posed: No osed: No ion in Area: 32.4% ion in Volume: 32.4% alization: Small (1-33%) ng: No ning: No Wyatt Notes Wound #1 (Right, Anterior Lower Leg) 1. Cleanse With Wound Cleanser Soap and water 2. Periwound Care Moisturizing lotion TCA Cream 3. Primary Dressing Applied Hydrofera Blue 4. Secondary Dressing ABD Pad Kerramax/Xtrasorb 6. Support Layer Applied 3 layer compression wrap Notes netting. Electronic Signature(s) Signed: 09/06/2019 5:16:59 PM By: Mikeal Hawthorne EMT/HBOT Signed: 09/08/2019 8:56:01 AM By: Dennis Hurst RN, Wyatt Previous Signature: 09/05/2019 5:29:42 PM Version By: Dennis Wyatt Entered By: Mikeal Hawthorne on 09/06/2019  13:39:24 -------------------------------------------------------------------------------- Wound Assessment Details Patient Name: Date of Service: Dennis Wyatt, Dennis Wyatt 09/05/2019 3:45 PM Medical Record [id number] Patient Account Number: [id number] Date of Birth/Sex: Treating RN: 23-May-1961 (59 y.o. Dennis Wyatt Primary Care Jaecob Lowden: Dennis Wyatt Other Wyatt: Referring Mikhi Athey: Treating Elene Downum/Extender:Dennis Wyatt: 5 Wound Status Wound Number: 2 Primary Venous Leg Ulcer Etiology: Wound Location: Right Lower Leg - Lateral Secondary Diabetic Wound/Ulcer of the Lower Wounding Event: Gradually Appeared Etiology: Extremity Date Acquired: 07/11/2019 Wound Open Weeks Of Wyatt: 5 Status: Clustered Wound: No Comorbid Deep Vein Thrombosis, Hypertension, Type History: II Diabetes, Osteoarthritis Photos Wound Measurements Length: (cm) 2.3 % Reduct Width: (cm) 1.6 % Reduct Depth: (cm) 0.2 Epitheli Area: (cm) 2.89 Tunneli Volume: (cm) 0.578 Undermi Wound Description Classification: Full Thickness Without Exposed Support Foul Odo Structures Slough/F Wound Flat and Intact Margin: Exudate Small Amount: Exudate Serosanguineous Type: Exudate red, brown Color: Wound Bed Granulation Amount: Medium (34-66%) Granulation Quality: Red Fascia E Necrotic Amount: Medium (34-66%) Fat Laye Necrotic Quality: Adherent Slough Tendon E Muscle E Joint Ex Bone Exp r After Cleansing: No ibrino Yes Exposed Structure xposed: No r (Subcutaneous Tissue) Exposed: Yes xposed: No xposed: No posed: No osed: No ion in Area: 12.4% ion in Volume: 41.6% alization: None ng: No ning: No Wyatt Notes Wound #2 (Right, Lateral Lower Leg) 1. Cleanse With Wound Cleanser Soap and water 2. Periwound Care Moisturizing lotion TCA Cream 3. Primary Dressing Applied Hydrofera Blue 4. Secondary Dressing ABD  Pad Kerramax/Xtrasorb 6. Support Layer Applied 3 layer compression wrap Notes netting. Electronic Signature(s) Signed: 09/06/2019 5:16:59 PM By: Mikeal Hawthorne EMT/HBOT Signed: 09/08/2019 8:56:01 AM By: Dennis Hurst RN, Wyatt Previous Signature: 09/05/2019 5:29:42 PM Version By: Dennis Wyatt Entered By: Mikeal Hawthorne on 09/06/2019 13:39:44 -------------------------------------------------------------------------------- Wound Assessment Details Patient Name: Date of Service: CHAVIS, TESSLER 09/05/2019 3:45 PM Medical Record [id number] Patient Account Number: [id number] Date of Birth/Sex: Treating RN: [birth year] (59 y.o. Dennis Wyatt Primary Care Kmari Brian: Dennis Wyatt Other Wyatt: Referring Bralin Garry: Treating Wally Shevchenko/Extender:Dennis Wyatt: 5 Wound Status Wound Number: 3 Primary Venous Leg Ulcer Etiology: Wound Location: Right Lower Leg - Posterior Secondary Diabetic Wound/Ulcer of the Lower Wounding Event: Gradually Appeared Etiology: Extremity Date Acquired: 07/11/2019 Wound Open Weeks Of Wyatt: 5 Status: Clustered Wound: No Comorbid Deep Vein Thrombosis, Hypertension, Type History: II Diabetes, Osteoarthritis Photos Wound Measurements Length: (cm) 1 % Reduct Width: (cm) 0.9 % Reduct Depth: (cm) 0.1 Epitheli Area: (cm) 0.707 Tunneli Volume: (cm) 0.071 Undermi Wound Description Full Thickness Without Exposed Support Foul Odo Classification: Structures Slough/F Exudate Medium Amount: Exudate Serosanguineous Type: Exudate red, brown Color: Wound Bed Granulation Amount: Large (67-100%) Granulation Quality: Red, Pink Fascia E Necrotic Amount: Small (1-33%) Fat Laye Necrotic Quality: Adherent Slough Tendon E Muscle E Joint Ex Bone Exp r After Cleansing: No ibrino Yes Exposed Structure  xposed: No r (Subcutaneous Tissue) Exposed: Yes xposed: No xposed: No posed: No osed: No ion  in Area: 80% ion in Volume: 79.9% alization: None ng: No ning: No Wyatt Notes Wound #3 (Right, Posterior Lower Leg) 1. Cleanse With Wound Cleanser Soap and water 2. Periwound Care Moisturizing lotion TCA Cream 3. Primary Dressing Applied Hydrofera Blue 4. Secondary Dressing ABD Pad Kerramax/Xtrasorb 6. Support Layer Applied 3 layer compression wrap Notes netting. Electronic Signature(s) Signed: 09/06/2019 5:16:59 PM By: Mikeal Hawthorne EMT/HBOT Signed: 09/08/2019 8:56:01 AM By: Dennis Hurst RN, Wyatt Previous Signature: 09/05/2019 5:29:42 PM Version By: Dennis Wyatt Entered By: Mikeal Hawthorne on 09/06/2019 13:40:05 -------------------------------------------------------------------------------- Vitals Details Patient Name: Date of Service: ARSAL, TAPPAN 09/05/2019 3:45 PM Medical Record [id number] Patient Account Number: [id number] Date of Birth/Sex: Treating RN: May 07, 1961 (59 y.o. Dennis Wyatt Primary Care Norrine Ballester: Dennis Wyatt Other Wyatt: Referring Chavez Rosol: Treating Jakevion Arney/Extender:Dennis Wyatt: 5 Vital Signs Time Taken: 16:11 Temperature (F): 98.5 Height (in): 70 Pulse (bpm): 58 Weight (lbs): 302 Respiratory Rate (breaths/min): 18 Body Mass Index (BMI): 43.3 Blood Pressure (mmHg): 153/60 Reference Range: 80 - 120 mg / dl Electronic Signature(s) Signed: 09/05/2019 5:29:42 PM By: Dennis Wyatt Entered By: Dennis Wyatt on 09/05/2019 16:12:34

## 2019-09-12 ENCOUNTER — Encounter (HOSPITAL_BASED_OUTPATIENT_CLINIC_OR_DEPARTMENT_OTHER): Payer: PPO | Attending: Internal Medicine | Admitting: Internal Medicine

## 2019-09-12 ENCOUNTER — Other Ambulatory Visit: Payer: Self-pay

## 2019-09-12 DIAGNOSIS — L97812 Non-pressure chronic ulcer of other part of right lower leg with fat layer exposed: Secondary | ICD-10-CM | POA: Insufficient documentation

## 2019-09-12 DIAGNOSIS — I87331 Chronic venous hypertension (idiopathic) with ulcer and inflammation of right lower extremity: Secondary | ICD-10-CM | POA: Diagnosis not present

## 2019-09-12 DIAGNOSIS — E11622 Type 2 diabetes mellitus with other skin ulcer: Secondary | ICD-10-CM | POA: Insufficient documentation

## 2019-09-12 DIAGNOSIS — Z87891 Personal history of nicotine dependence: Secondary | ICD-10-CM | POA: Insufficient documentation

## 2019-09-12 DIAGNOSIS — E11621 Type 2 diabetes mellitus with foot ulcer: Secondary | ICD-10-CM | POA: Insufficient documentation

## 2019-09-12 DIAGNOSIS — Z96642 Presence of left artificial hip joint: Secondary | ICD-10-CM | POA: Insufficient documentation

## 2019-09-12 DIAGNOSIS — S81801A Unspecified open wound, right lower leg, initial encounter: Secondary | ICD-10-CM | POA: Diagnosis not present

## 2019-09-12 DIAGNOSIS — I87311 Chronic venous hypertension (idiopathic) with ulcer of right lower extremity: Secondary | ICD-10-CM | POA: Diagnosis not present

## 2019-09-12 DIAGNOSIS — I10 Essential (primary) hypertension: Secondary | ICD-10-CM | POA: Diagnosis not present

## 2019-09-12 DIAGNOSIS — E1142 Type 2 diabetes mellitus with diabetic polyneuropathy: Secondary | ICD-10-CM | POA: Diagnosis not present

## 2019-09-12 DIAGNOSIS — M1990 Unspecified osteoarthritis, unspecified site: Secondary | ICD-10-CM | POA: Insufficient documentation

## 2019-09-12 DIAGNOSIS — Z86718 Personal history of other venous thrombosis and embolism: Secondary | ICD-10-CM | POA: Insufficient documentation

## 2019-09-12 DIAGNOSIS — I872 Venous insufficiency (chronic) (peripheral): Secondary | ICD-10-CM | POA: Insufficient documentation

## 2019-09-12 NOTE — Progress Notes (Addendum)
Dennis Wyatt, Dennis Wyatt ([id number]) Visit Report for 09/12/2019 Arrival Information Details Patient Name: Date of Service: Dennis Wyatt, Dennis Wyatt 09/12/2019 4:00 PM Medical Record [id number] Patient Account Number: [id number] Date of Birth/Sex: Treating RN: 01/05/1961 (59 y.o. Dennis Wyatt Primary Care [name]: [name] [name]: Treating [name], [name], [name] Weeks in Treatment: 6 Visit Information History Since Last Visit Added or deleted any medications: No Patient Arrived: Ambulatory Any new allergies or adverse reactions: No Arrival Time: 16:42 Had a fall or experienced change in No Accompanied By: self activities of daily living that may affect Transfer Assistance: None risk of falls: Patient Identification Verified: Yes Signs or symptoms of abuse/neglect since last No Secondary Verification Process Completed: Yes visito Patient Requires Transmission-Based No Hospitalized since last visit: No Precautions: Implantable device outside of the clinic excluding No Patient Has Alerts: No cellular tissue based products placed in the center since last visit: Has Dressing in Place as Prescribed: Yes Has Compression in Place as Prescribed: Yes Pain Present Now: No Electronic Signature(s) Signed: 09/12/2019 5:21:05 PM By: [name] Entered By: [name] on 09/12/2019 16:42:20 -------------------------------------------------------------------------------- Compression Therapy Details Patient Name: Date of Service: Dennis Wyatt 09/12/2019 4:00 PM Medical Record [id number] Patient Account Number: [id number] Date of Birth/Sex: Treating RN: Apr 07, 1961 (59 y.o. [name] Primary Care Deajah Erkkila: [name] Other Clinician: [name]: Treating Marian Grandt/Extender:[name], [name], [name] Weeks in Treatment: 6 Compression Therapy Performed for Wound Wound  #1 Right,Anterior Lower Leg Assessment: Performed By: Clinician [name], RN Compression Type: Three Layer Post Procedure Diagnosis Same as Pre-procedure Electronic Signature(s) Signed: 09/12/2019 5:57:14 PM By: [name] RN, BSN Entered By: [name] on 09/12/2019 17:07:38 -------------------------------------------------------------------------------- Compression Therapy Details Patient Name: Date of Service: Dennis Wyatt 09/12/2019 4:00 PM Medical Record [id number] Patient Account Number: [id number] Date of Birth/Sex: Treating RN: 07/08/1961 (59 y.o. [name] Primary Care Magaly Pollina: [name] Other Clinician: [name]: Treating Koby Hartfield/Extender:[name], [name], [name] Weeks in Treatment: 6 Compression Therapy Performed for Wound Wound #2 Right,Lateral Lower Leg Assessment: Performed By: Clinician [name], RN Compression Type: Three Layer Post Procedure Diagnosis Same as Pre-procedure Electronic Signature(s) Signed: 09/12/2019 5:57:14 PM By: [name] RN, BSN Entered By: [name] on 09/12/2019 17:07:38 -------------------------------------------------------------------------------- Encounter Discharge Information Details Patient Name: Date of Service: Dennis Wyatt, Dennis Wyatt 09/12/2019 4:00 PM Medical Record [id number] Patient Account Number: [id number] Date of Birth/Sex: Treating RN: 05-Apr-1961 (59 y.o. [name] Primary Care Nika Yazzie: [name] Other Clinician: [name]: Treating Cosme Jacob/Extender:[name], [name], [name] Weeks in Treatment: 6 Encounter Discharge Information Items Discharge Condition: Stable Ambulatory Status: Cane Discharge Destination: Home Transportation: Private Auto Accompanied By: self Schedule Follow-up Appointment: Yes Clinical Summary of Care: Electronic Signature(s) Signed: 09/12/2019 5:15:23 PM By: [name] Entered  By: [name] on 09/12/2019 17:11:35 -------------------------------------------------------------------------------- Lower Extremity Assessment Details Patient Name: Date of Service: Dennis Wyatt, Dennis Wyatt 09/12/2019 4:00 PM Medical Record [id number] Patient Account Number: [id number] Date of Birth/Sex: Treating RN: 08-Jun-1961 (59 y.o. Dennis Wyatt Primary Care Shamari Trostel: [name] Other Clinician: [name]: Treating Lorris Carducci/Extender:[name], [name], [name] Weeks in Treatment: 6 Edema Assessment Assessed: [Left: No] [Right: No] Edema: [Left: Ye] [Right: s] Calf Left: Right: Point of Measurement: 36 cm From Medial Instep cm 38 cm Ankle Left: Right: Point of Measurement: 9 cm From Medial Instep cm 25 cm Vascular Assessment Pulses: Dorsalis Pedis Palpable: [Right:Yes] Electronic Signature(s) Signed: 09/12/2019 5:21:05 PM By: [name] Entered By: [name] on 09/12/2019 16:44:37 --------------------------------------------------------------------------------  Multi Wound Chart Details Patient Name: Date of Service: Dennis Wyatt, Dennis Wyatt 09/12/2019 4:00 PM Medical Record [id number] Patient Account Number: [id number] Date of Birth/Sex: Treating RN: 01/19/1961 (59 y.o. [name] Primary Care Lizzy Hamre: [name] Other Clinician: [name]: Treating Malcomb Gangemi/Extender:[name], [name], [name] Weeks in Treatment: 6 Vital Signs Height(in): 70 Pulse(bpm): 19 Weight(lbs): 302 Blood Pressure(mmHg): 140/47 Body Mass Index(BMI): 43 Temperature(F): 97.6 Respiratory 19 Rate(breaths/min): Photos: [1:No Photos] [2:No Photos] [3:No Photos] Wound Location: [1:Right, Anterior Lower Leg Right, Lateral Lower Leg] [3:Right, Posterior Lower Leg] Wounding Event: [1:Trauma] [2:Gradually Appeared] [3:Gradually Appeared] Primary Etiology: [1:Venous Leg Ulcer] [2:Venous Leg Ulcer] [3:Venous Leg  Ulcer] Secondary Etiology: [1:Diabetic Wound/Ulcer of the Diabetic Wound/Ulcer of the Diabetic Wound/Ulcer of the Lower Extremity] [2:Lower Extremity] [3:Lower Extremity] Comorbid History: [1:Deep Vein Thrombosis, Hypertension, Type II Diabetes, Osteoarthritis] [2:Deep Vein Thrombosis, Hypertension, Type II Diabetes, Osteoarthritis] [3:Deep Vein Thrombosis, Hypertension, Type II Diabetes, Osteoarthritis] Date Acquired: [1:06/14/2019] [2:07/11/2019] [3:07/11/2019] Weeks of Treatment: [1:6] [2:6] [3:6] Wound Status: [1:Open] [2:Open] [3:Healed - Epithelialized] Measurements L x W x D 3x2x0.1 [2:2.1x1.4x0.2] [3:0x0x0] (cm) Area (cm) : [1:4.712] [1:4.431] [3:0] Volume (cm) : [1:0.471] [2:0.462] [3:0] % Reduction in Area: [1:42.10%] [2:30.00%] [3:100.00%] % Reduction in Volume: 71.10% [2:53.30%] [3:100.00%] Classification: [1:Full Thickness Without Exposed Support Structures Exposed Support Structures Exposed Support Structures] [2:Full Thickness Without] [3:Full Thickness Without] Exudate Amount: [1:Small] [2:Small] [3:None Present] Exudate Type: [1:Serosanguineous] [2:Serosanguineous] [3:N/A] Exudate Color: [1:red, brown] [2:red, brown] [3:N/A] Wound Margin: [1:Flat and Intact] [2:Flat and Intact] [3:Distinct, outline attached] Granulation Amount: [1:Large (67-100%)] [2:Medium (34-66%)] [3:None Present (0%)] Granulation Quality: [1:Red] [2:Red] [3:N/A] Necrotic Amount: [1:Small (1-33%)] [2:Medium (34-66%)] [3:None Present (0%)] Exposed Structures: [1:Fat Layer (Subcutaneous Fat Layer (Subcutaneous Fascia: No Tissue) Exposed: Yes Fascia: No Tendon: No Muscle: No Joint: No Bone: No] [2:Tissue) Exposed: Yes Fascia: No Tendon: No Muscle: No Joint: No Bone: No] [3:Fat Layer (Subcutaneous Tissue)  Exposed: No Tendon: No Muscle: No Joint: No Bone: No] Epithelialization: [1:Small (1-33%)] [2:None Compression Therapy] [3:Large (67-100%) N/A] Treatment Notes Wound #1 (Right, Anterior Lower Leg) 1.  Cleanse With Wound Cleanser Soap and water 2. Periwound Care Moisturizing lotion TCA Cream 3. Primary Dressing Applied Hydrofera Blue 4. Secondary Dressing ABD Pad 6. Support Layer Applied 3 layer compression wrap Notes netting. Wound #2 (Right, Lateral Lower Leg) 1. Cleanse With Wound Cleanser Soap and water 2. Periwound Care Moisturizing lotion TCA Cream 3. Primary Dressing Applied Hydrofera Blue 4. Secondary Dressing ABD Pad 6. Support Layer Applied 3 layer compression wrap Notes netting. Electronic Signature(s) Signed: 09/12/2019 5:45:55 PM By: Linton Ham MD Signed: 09/12/2019 5:57:14 PM By: [name] RN, BSN Entered By: Linton Ham on 09/12/2019 17:17:51 -------------------------------------------------------------------------------- Multi-Disciplinary Care Plan Details Patient Name: Date of Service: Dennis Wyatt, Dennis Wyatt 09/12/2019 4:00 PM Medical Record [id number] Patient Account Number: [id number] Date of Birth/Sex: Treating RN: 1960/11/23 (59 y.o. [name] Primary Care Cherisa Brucker: [name] Other Clinician: Referring Agam Tuohy: Treating Kolby Schara/Extender:[name], [name], [name] Weeks in Treatment: 6 Active Inactive Venous Leg Ulcer Nursing Diagnoses: Knowledge deficit related to disease process and management Potential for venous Insuffiency (use before diagnosis confirmed) Goals: Patient will maintain optimal edema control Date Initiated: 08/01/2019 Target Resolution Date: 09/30/2019 Goal Status: Active Patient/caregiver will verbalize understanding of disease process and disease management Date Initiated: 08/01/2019 Date Inactivated: 08/29/2019 Target Resolution Date: 09/02/2019 Goal Status: Met Interventions: Assess peripheral edema status every visit. Compression as ordered Provide education on venous insufficiency Notes: Wound/Skin Impairment Nursing Diagnoses: Impaired tissue integrity Knowledge  deficit related to  smoking impact on wound healing Knowledge deficit related to ulceration/compromised skin integrity Goals: Patient/caregiver will verbalize understanding of skin care regimen Date Initiated: 08/01/2019 Target Resolution Date: 09/30/2019 Goal Status: Active Interventions: Assess patient/caregiver ability to obtain necessary supplies Assess patient/caregiver ability to perform ulcer/skin care regimen upon admission and as needed Assess ulceration(s) every visit Provide education on ulcer and skin care Notes: Electronic Signature(s) Signed: 09/12/2019 5:57:14 PM By: [name] RN, BSN Entered By: [name] on 09/12/2019 17:49:25 -------------------------------------------------------------------------------- Pain Assessment Details Patient Name: Date of Service: Dennis Wyatt, Dennis Wyatt 09/12/2019 4:00 PM Medical Record [id number] Patient Account Number: [id number] Date of Birth/Sex: Treating RN: Sep 30, 1960 (59 y.o. Dennis Wyatt Primary Care Kenyette Gundy: [name] Other Clinician: Referring Nika Yazzie: Treating Evelynne Spiers/Extender:[name], [name], [name] Weeks in Treatment: 6 Active Problems Location of Pain Severity and Description of Pain Patient Has Paino No Site Locations Pain Management and Medication Current Pain Management: Electronic Signature(s) Signed: 09/12/2019 5:21:05 PM By: [name] Entered By: [name] on 09/12/2019 16:44:11 -------------------------------------------------------------------------------- Patient/Caregiver Education Details Patient Name: Date of Service: Dennis Wyatt, Dennis Wyatt 3/1/2021andnbsp4:00 PM Medical Record [id number] Patient Account Number: [id number] Date of Birth/Gender: Treating RN: [birth year] (59 y.o. [name] Primary Care Physician: [name] Other Clinician: Referring Physician: Treating Physician/Extender:[name], [name], [name] Weeks  in Treatment: 6 Education Assessment Education Provided To: Patient Education Topics Provided Venous: Methods: Explain/Verbal Responses: State content correctly Wound/Skin Impairment: Methods: Explain/Verbal Responses: State content correctly Electronic Signature(s) Signed: 09/12/2019 5:57:14 PM By: [name] RN, BSN Entered By: [name] on 09/12/2019 17:49:36 -------------------------------------------------------------------------------- Wound Assessment Details Patient Name: Date of Service: Dennis Wyatt, Dennis Wyatt 09/12/2019 4:00 PM Medical Record [id number] Patient Account Number: [id number] Date of Birth/Sex: Treating RN: [birth year] (59 y.o. [name] Primary Care Rhiannon Sassaman: [name] Other Clinician: Referring Alyssamae Klinck: Treating Bethany Hirt/Extender:[name], [name], [name] Weeks in Treatment: 6 Wound Status Wound Number: 1 Primary Venous Leg Ulcer Etiology: Wound Location: Right, Anterior Lower Leg Secondary Diabetic Wound/Ulcer of the Lower Wounding Event: Trauma Etiology: Extremity Date Acquired: 06/14/2019 Wound Open Weeks Of Treatment: 6 Status: Clustered Wound: No Comorbid Deep Vein Thrombosis, Hypertension, Type History: II Diabetes, Osteoarthritis Photos Wound Measurements Length: (cm) 3 % Reduct Width: (cm) 2 % Reduct Depth: (cm) 0.1 Epitheli Area: (cm) 4.712 Tunneli Volume: (cm) 0.471 Undermi Wound Description Classification: Full Thickness Without Exposed Support Foul Odo Structures Slough/F Wound Flat and Intact Margin: Exudate Small Amount: Exudate Serosanguineous Type: Exudate red, brown Color: Wound Bed Granulation Amount: Large (67-100%) Granulation Quality: Red Fascia E Necrotic Amount: Small (1-33%) Fat Laye Necrotic Quality: Adherent Slough Tendon E Muscle E Joint Ex Bone Exp r After Cleansing: No ibrino Yes Exposed Structure xposed: No r (Subcutaneous Tissue) Exposed: Yes xposed:  No xposed: No posed: No osed: No ion in Area: 42.1% ion in Volume: 71.1% alization: Small (1-33%) ng: No ning: No Electronic Signature(s) Signed: 09/16/2019 4:01:29 PM By: Mikeal Hawthorne EMT/HBOT Signed: 10/26/2019 9:05:01 AM By: [name] RN, BSN Previous Signature: 09/12/2019 5:57:14 PM Version By: [name] RN, BSN Entered By: Mikeal Hawthorne on 09/16/2019 13:45:04 -------------------------------------------------------------------------------- Wound Assessment Details Patient Name: Date of Service: Dennis Wyatt, Dennis Wyatt 09/12/2019 4:00 PM Medical Record [id number] Patient Account Number: [id number] Date of Birth/Sex: Treating RN: 1961/06/28 (59 y.o. [name] Primary Care Bridger Pizzi: [name] Other Clinician: Referring Korina Tretter: Treating Toshiro Hanken/Extender:[name], [name], [name] Weeks in Treatment: 6 Wound Status Wound Number: 2 Primary Venous Leg Ulcer Etiology: Wound Location: Right Lower Leg - Lateral Secondary Diabetic Wound/Ulcer of the  Lower Wounding Event: Gradually Appeared Etiology: Extremity Date Acquired: 07/11/2019 Wound Open Weeks Of Treatment: 6 Status: Clustered Wound: No Comorbid Deep Vein Thrombosis, Hypertension, Type History: II Diabetes, Osteoarthritis Photos Wound Measurements Length: (cm) 2.1 % Reduct Width: (cm) 1.4 % Reduct Depth: (cm) 0.2 Epitheli Area: (cm) 2.309 Tunneli Volume: (cm) 0.462 Undermi Wound Description Classification: Full Thickness Without Exposed Support Foul Odo Structures Slough/F Wound Flat and Intact Margin: Exudate Small Small Amount: Exudate Serosanguineous Type: Exudate red, brown Color: Wound Bed Granulation Amount: Medium (34-66%) Granulation Quality: Red Fascia Exp Necrotic Amount: Medium (34-66%) Fat Layer Necrotic Quality: Adherent Slough Tendon Exp Muscle Exp Joint Expo Bone Expos r After Cleansing: No ibrino Yes Exposed Structure osed: No (Subcutaneous  Tissue) Exposed: Yes osed: No osed: No sed: No ed: No ion in Area: 30% ion in Volume: 53.3% alization: None ng: No ning: No Electronic Signature(s) Signed: 09/16/2019 4:01:29 PM By: Mikeal Hawthorne EMT/HBOT Signed: 10/26/2019 9:05:01 AM By: [name] RN, BSN Previous Signature: 09/12/2019 5:57:14 PM Version By: [name] RN, BSN Entered By: Mikeal Hawthorne on 09/16/2019 13:45:19 -------------------------------------------------------------------------------- Wound Assessment Details Patient Name: Date of Service: Dennis Wyatt, Dennis Wyatt 09/12/2019 4:00 PM Medical Record [id number] Patient Account Number: [id number] Date of Birth/Sex: Treating RN: [birth year] (59 y.o. [name] Primary Care Naren Benally: [name] Other Clinician: Referring Sneha Willig: Treating Jaiyah Beining/Extender:[name], [name], [name] Weeks in Treatment: 6 Wound Status Wound Number: 3 Primary Venous Leg Ulcer Etiology: Wound Location: Right Lower Leg - Posterior Secondary Diabetic Wound/Ulcer of the Lower Wounding Event: Gradually Appeared Etiology: Extremity Date Acquired: 07/11/2019 Wound Healed - Epithelialized Weeks Of Treatment: 6 Status: Clustered Wound: No Comorbid Deep Vein Thrombosis, Hypertension, Type History: II Diabetes, Osteoarthritis Photos Wound Measurements Length: (cm) 0 % Reduct Width: (cm) 0 % Reduct Depth: (cm) 0 Epitheli Area: (cm) 0 Tunneli Volume: (cm) 0 Undermi Wound Description Classification: Full Thickness Without Exposed Support Foul Odo Structures Slough/F Wound Distinct, outline attached Margin: Exudate None Present Amount: Wound Bed Granulation Amount: None Present (0%) Necrotic Amount: None Present (0%) Fascia E Fat Laye Tendon E Muscle E Joint Ex Bone Exp r After Cleansing: No ibrino No Exposed Structure xposed: No r (Subcutaneous Tissue) Exposed: No xposed: No xposed: No posed: No osed: No ion in Area: 100% ion in  Volume: 100% alization: Large (67-100%) ng: No ning: No Electronic Signature(s) Signed: 09/16/2019 4:01:29 PM By: Mikeal Hawthorne EMT/HBOT Signed: 10/26/2019 9:05:01 AM By: [name] RN, BSN Previous Signature: 09/12/2019 5:57:14 PM Version By: [name] RN, BSN Entered By: Mikeal Hawthorne on 09/16/2019 13:47:02 -------------------------------------------------------------------------------- Vitals Details Patient Name: Date of Service: Dennis Wyatt, Dennis Wyatt 09/12/2019 4:00 PM Medical Record [id number] Patient Account Number: [id number] Date of Birth/Sex: Treating RN: [birth year] (59 y.o. Dennis Wyatt Primary Care Dereck Agerton: [name] Other Clinician: Referring Riyanshi Wahab: Treating Samayra Hebel/Extender:[name], [name], [name] Weeks in Treatment: 6 Vital Signs Time Taken: 16:40 Temperature (F): 97.6 Height (in): 70 Pulse (bpm): 56 Weight (lbs): 302 Respiratory Rate (breaths/min): 19 Body Mass Index (BMI): 43.3 Blood Pressure (mmHg): 140/47 Reference Range: 80 - 120 mg / dl Electronic Signature(s) Signed: 09/12/2019 5:21:05 PM By: [name] Entered By: [name] on 09/12/2019 16:43:23

## 2019-09-13 NOTE — Progress Notes (Signed)
Dennis Wyatt, Dennis Wyatt ([id number]) Visit Report for 09/12/2019 HPI Details Patient Name: Date of Service: Dennis Wyatt, Dennis Wyatt 09/12/2019 4:00 PM Medical Record [id number] Patient Account Number: [id number] Date of Birth/Sex: Treating RN: 04-29-1961 (59 y.o. [name] Primary Care Provider: [name] Other Clinician: Referring Provider: Treating Provider/Extender:Braelyn Bordonaro, [name], [name] Weeks in Treatment: 6 History of Present Illness HPI Description: ADMISSION 08/01/2019 This is a 59 year old man who is essentially self-referred although he gets his primary care at [facility]. We did not have any information from them. He is here for review of 3 wounds on the right lower leg. He states about a month and a half ago he dropped his cell phone on the right anterior leg creating a wound. More recently he has had 2 blisters come out 1 medially just above the lateral malleolus and one on the posterior calf. All of these are roughly the same condition however. He has been applying some form of ointment were not really sure what this is. He does not have a wound history. 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 ABI in our clinic was 0.95 1/25; the patient has 3 wounds on the right leg. One is on the right anterior 1 just above the lateral malleolus and one on the posterior calf. We have been using Iodoflex under compression. The base of these wounds appears better 2/1; still the 3 wounds one on the right anterior 1 on the lateral and 1 on the posterior calf we have been using Iodoflex under compression making some general improvement in the wound bed. The patient went for his reflux studies on 1/28. This showed reflux in the common femoral vein and greater than 1 second in  the greater saphenous vein at the saphenofemoral junction but nothing in the more superficial veins below the knee either greater or lesser saphenous. I therefore I do not think he is going to be M amenable for an ablation. The report did note an aging consistent deep venous thrombosis involving the right popliteal artery. This led me to research [place] link. He had a similar finding on a an arterial duplex study in 2014 at which time he was recovering from a subarachnoid hemorrhage. This was seen by the rehab team who admitted him to rehab after the surgery for an anterior cerebral aneurysm. By that time it was felt safe to anticoagulate him and he was put on Xarelto and continued that according to the patient to the last year or 2 when it was stopped and he is only on aspirin. 2/8; all of his wounds appear to be making some progress. We have been using Iodoflex. Base of the wounds appear to be cleaning up nicely but still requiring debridement. We have tried to put Apligraf through his insurance related to deep punched out chronic venous insufficiency wounds and a type II diabetic. We will see if this is affordable 2/15; we are using Iodoflex on the wound areas. Everything looks like it is getting better the anterior and posterior wounds on the right contracted however the area on the lateral side is about the same size. We did not hear anything about Apligraf. 2/22; patient has a $426 per application out-of-pocket co-pay for Apligraf. This was beyond his means. He has good edema control 3/1; one of his wounds on the posterior calf is healed. The areas anteriorly  and laterally look healthy and are contracted. We are using Hydrofera Blue under compression Electronic Signature(s) Signed: 09/12/2019 5:45:55 PM By: [name] MD Entered By: [name] on 09/12/2019 17:18:50 -------------------------------------------------------------------------------- Physical Exam  Details Patient Name: Date of Service: Dennis Wyatt, Dennis Wyatt 09/12/2019 4:00 PM Medical Record [id number] Patient Account Number: [id number] Date of Birth/Sex: Treating RN: [birth year] (59 y.o. [name] Primary Care Provider: [name] Other Clinician: Referring Provider: Treating Provider/Extender:Arrin Ishler, [name], [name]: 6 Constitutional Sitting or standing Blood Pressure is within target range for patient.. Pulse regular and within target range for patient.Marland Kitchen Respirations regular, non-labored and within target range.. Temperature is normal and within the target range for the patient.Marland Kitchen Appears in no distress. Respiratory work of breathing is normal. Cardiovascular Pedal pulses are palpable. Edema control is good. Psychiatric appears at normal baseline. Notes Wound exam; major wound on the lower lip leg laterally and anteriorly. The area posteriorly is closed. Surface of these wounds looks healthy no debridement is required. There is still some depth Electronic Signature(s) Signed: 09/12/2019 5:45:55 PM By: [name] MD Entered By: [name] on 09/12/2019 17:20:20 -------------------------------------------------------------------------------- Physician Orders Details Patient Name: Date of Service: Dennis Wyatt, Dennis Wyatt 09/12/2019 4:00 PM Medical Record [id number] Patient Account Number: [id number] Date of Birth/Sex: Treating RN: Mar 08, 1961 (59 y.o. [name] Primary Care Provider: [name] Other Clinician: Referring Provider: Treating Provider/Extender:Monya Kozakiewicz, [name], [name]: 6 Verbal / Phone Orders: No Diagnosis Coding ICD-10 Coding Code Description [phone] Non-pressure chronic ulcer of other part of right lower leg with fat layer exposed I87.331 Chronic venous hypertension (idiopathic) with ulcer and inflammation of right lower extremity E11.622 Type 2  diabetes mellitus with other skin ulcer E11.42 Type 2 diabetes mellitus with diabetic polyneuropathy Follow-up Appointments Return Appointment in 1 week. Dressing Change Frequency Do not change entire dressing for one week. Skin Barriers/Peri-Wound Care Moisturizing lotion TCA Cream or Ointment - mixed with lotion Wound Cleansing May shower with protection. - may use cast protector Primary Wound Dressing Wound #1 Right,Anterior Lower Leg Hydrofera Blue - Classic Wound #2 Right,Lateral Lower Leg Hydrofera Blue - Classic Secondary Dressing Wound #1 Right,Anterior Lower Leg ABD pad Zetuvit or Kerramax Wound #2 Right,Lateral Lower Leg ABD pad Zetuvit or Kerramax Edema Control 3 Layer Compression System - Right Lower Extremity Avoid standing for long periods of time Elevate legs to the level of the heart or above for 30 minutes daily and/or when sitting, a frequency of: - throughout the day Exercise regularly Electronic Signature(s) Signed: 09/12/2019 5:45:55 PM By: [name] MD Signed: 09/12/2019 5:57:14 PM By: Zandra Abts RN, BSN Entered By: Zandra Abts on 09/12/2019 17:07:14 -------------------------------------------------------------------------------- Problem List Details Patient Name: Date of Service: Dennis Wyatt, Dennis Wyatt 09/12/2019 4:00 PM Medical Record [id number] Patient Account Number: [id number] Date of Birth/Sex: Treating RN: September 28, 1960 (58 y.o. [name] Primary Care Provider: Other Clinician: [name] Referring Provider: Treating Provider/Extender:Callum Wolf, [name], [name]: 6 Active Problems ICD-10 Evaluated Encounter Code Description Active Date Today Diagnosis [phone] Non-pressure chronic ulcer of other part of right lower 08/01/2019 No Yes leg with fat layer exposed I87.331 Chronic venous hypertension (idiopathic) with ulcer 08/01/2019 No Yes and inflammation of right lower extremity E11.622  Type 2 diabetes mellitus with other skin ulcer 08/01/2019 No Yes E11.42 Type 2 diabetes mellitus with diabetic polyneuropathy 08/01/2019 No Yes Inactive Problems Resolved Problems Electronic Signature(s) Signed: 09/12/2019 5:45:55 PM By: [name] MD Entered By: [name] on 09/12/2019  17:17:42 -------------------------------------------------------------------------------- Progress Note Details Patient Name: Date of Service: Dennis Wyatt, Dennis Wyatt 09/12/2019 4:00 PM Medical Record Number:[id number] Patient Account Number: [id number] Date of Birth/Sex: Treating RN: [birth year] (59 y.o. [name] Primary Care Provider: [name] Other Clinician: Referring Provider: Treating Provider/Extender:Raevyn Sokol, [name], [name]: 6 Subjective History of Present Illness (HPI) ADMISSION 08/01/2019 This is a 59 year old man who is essentially self-referred although he gets his primary care at [facility]. We did not have any information from them. He is here for review of 3 wounds on the right lower leg. He states about a month and a half ago he dropped his cell phone on the right anterior leg creating a wound. More recently he has had 2 blisters come out 1 medially just above the lateral malleolus and one on the posterior calf. All of these are roughly the same condition however. He has been applying some form of ointment were not really sure what this is. He does not have a wound history. 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 ABI in our clinic was 0.95 1/25; the patient has 3 wounds on the right leg. One is on the right anterior 1 just above the lateral malleolus and one on the posterior calf. We have been using Iodoflex under compression. The base  of these wounds appears better 2/1; still the 3 wounds one on the right anterior 1 on the lateral and 1 on the posterior calf we have been using Iodoflex under compression making some general improvement in the wound bed. The patient went for his reflux studies on 1/28. This showed reflux in the common femoral vein and greater than 1 second in the greater saphenous vein at the saphenofemoral junction but nothing in the more superficial veins below the knee either greater or lesser saphenous. I therefore I do not think he is going to be M amenable for an ablation. The report did note an aging consistent deep venous thrombosis involving the right popliteal artery. This led me to research [place] link. He had a similar finding on a an arterial duplex study in 2014 at which time he was recovering from a subarachnoid hemorrhage. This was seen by the rehab team who admitted him to rehab after the surgery for an anterior cerebral aneurysm. By that time it was felt safe to anticoagulate him and he was put on Xarelto and continued that according to the patient to the last year or 2 when it was stopped and he is only on aspirin. 2/8; all of his wounds appear to be making some progress. We have been using Iodoflex. Base of the wounds appear to be cleaning up nicely but still requiring debridement. We have tried to put Apligraf through his insurance related to deep punched out chronic venous insufficiency wounds and a type II diabetic. We will see if this is affordable 2/15; we are using Iodoflex on the wound areas. Everything looks like it is getting better the anterior and posterior wounds on the right contracted however the area on the lateral side is about the same size. We did not hear anything about Apligraf. 2/22; patient has a $200 per application out-of-pocket co-pay for Apligraf. This was beyond his means. He has good edema control 3/1; one of his wounds on the posterior calf is healed. The  areas anteriorly and laterally look  healthy and are contracted. We are using Hydrofera Blue under compression Objective Constitutional Sitting or standing Blood Pressure is within target range for patient.. Pulse regular and within target range for patient.Marland Kitchen Respirations regular, non-labored and within target range.. Temperature is normal and within the target range for the patient.Marland Kitchen Appears in no distress. Vitals Time Taken: 4:40 PM, Height: 70 in, Weight: 302 lbs, BMI: 43.3, Temperature: 97.6 F, Pulse: 56 bpm, Respiratory Rate: 19 breaths/min, Blood Pressure: 140/47 mmHg. Respiratory work of breathing is normal. Cardiovascular Pedal pulses are palpable. Edema control is good. Psychiatric appears at normal baseline. General Notes: Wound exam; major wound on the lower lip leg laterally and anteriorly. The area posteriorly is closed. Surface of these wounds looks healthy no debridement is required. There is still some depth Integumentary (Hair, Skin) Wound #1 status is Open. Original cause of wound was Trauma. The wound is located on the Right,Anterior Lower Leg. The wound measures 3cm length x 2cm width x 0.1cm depth; 4.712cm^2 area and 0.471cm^3 volume. There is Fat Layer (Subcutaneous Tissue) Exposed exposed. There is no tunneling or undermining noted. There is a small amount of serosanguineous drainage noted. The wound margin is flat and intact. There is large (67-100%) red granulation within the wound bed. There is a small (1-33%) amount of necrotic tissue within the wound bed including Adherent Slough. Wound #2 status is Open. Original cause of wound was Gradually Appeared. The wound is located on the Right,Lateral Lower Leg. The wound measures 2.1cm length x 1.4cm width x 0.2cm depth; 2.309cm^2 area and 0.462cm^3 volume. There is Fat Layer (Subcutaneous Tissue) Exposed exposed. There is no tunneling or undermining noted. There is a small amount of serosanguineous drainage noted.  The wound margin is flat and intact. There is medium (34-66%) red granulation within the wound bed. There is a medium (34-66%) amount of necrotic tissue within the wound bed including Adherent Slough. Wound #3 status is Healed - Epithelialized. Original cause of wound was Gradually Appeared. The wound is located on the Right,Posterior Lower Leg. The wound measures 0cm length x 0cm width x 0cm depth; 0cm^2 area and 0cm^3 volume. There is no tunneling or undermining noted. There is a none present amount of drainage noted. The wound margin is distinct with the outline attached to the wound base. There is no granulation within the wound bed. There is no necrotic tissue within the wound bed. Assessment Active Problems ICD-10 Non-pressure chronic ulcer of other part of right lower leg with fat layer exposed Chronic venous hypertension (idiopathic) with ulcer and inflammation of right lower extremity Type 2 diabetes mellitus with other skin ulcer Type 2 diabetes mellitus with diabetic polyneuropathy Procedures Wound #1 Pre-procedure diagnosis of Wound #1 is a Venous Leg Ulcer located on the Right,Anterior Lower Leg . There was a Three Layer Compression Therapy Procedure by Zandra Abts, RN. Post procedure Diagnosis Wound #1: Same as Pre-Procedure Wound #2 Pre-procedure diagnosis of Wound #2 is a Venous Leg Ulcer located on the Right,Lateral Lower Leg . There was a Three Layer Compression Therapy Procedure by Zandra Abts, RN. Post procedure Diagnosis Wound #2: Same as Pre-Procedure Plan Follow-up Appointments: Return Appointment in 1 week. Dressing Change Frequency: Do not change entire dressing for one week. Skin Barriers/Peri-Wound Care: Moisturizing lotion TCA Cream or Ointment - mixed with lotion Wound Cleansing: May shower with protection. - may use cast protector Primary Wound Dressing: Wound #1 Right,Anterior Lower Leg: Hydrofera Blue - Classic Wound #2 Right,Lateral Lower  Leg: Hydrofera Blue - Classic  Secondary Dressing: Wound #1 Right,Anterior Lower Leg: ABD pad Zetuvit or Kerramax Wound #2 Right,Lateral Lower Leg: ABD pad Zetuvit or Kerramax Edema Control: 3 Layer Compression System - Right Lower Extremity Avoid standing for long periods of time Elevate legs to the level of the heart or above for 30 minutes daily and/or when sitting, a frequency of: - throughout the day Exercise regularly 1. We are continuing with Hydrofera Blue, Kerramax ABDs under 3 layer compression which seems to be doing a good job controlling his edema 2. He had an unaffordable co-pay for Apligraf fortunately his wounds seem to be getting better Electronic Signature(s) Signed: 09/12/2019 5:45:55 PM By: [name] MD Entered By: [name] on 09/12/2019 17:24:03 -------------------------------------------------------------------------------- SuperBill Details Patient Name: Date of Service: Dennis Wyatt, Dennis Wyatt 09/12/2019 Medical Record [id number] Patient Account Number: [id number] Date of Birth/Sex: Treating RN: [birth year] (58 y.o. [name] Primary Care Provider: [name] Other Clinician: Referring Provider: Treating Provider/Extender:Amisadai Woodford, [name], [name]: 6 Diagnosis Coding ICD-10 Codes Code Description [phone] Non-pressure chronic ulcer of other part of right lower leg with fat layer exposed I87.331 Chronic venous hypertension (idiopathic) with ulcer and inflammation of right lower extremity E11.622 Type 2 diabetes mellitus with other skin ulcer E11.42 Type 2 diabetes mellitus with diabetic polyneuropathy Facility Procedures CPT4 Code Description: [id number] (Facility Use Only) [phone] - APPLY MULTLAY COMPRS LWR RT LEG Modifier: Quantity: 1 Physician Procedures CPT4 Code Description: [id number] - WC PHYS LEVEL 3 - EST PT ICD-10 Diagnosis Description L97.812 Non-pressure chronic ulcer of other part of  right lower le I87.331 Chronic venous hypertension (idiopathic) with ulcer and in lower extremity Modifier: g with fat layer flammation of ri Quantity: 1 exposed ght Electronic Signature(s) Signed: 09/12/2019 5:57:14 PM By: Zandra Abts RN, BSN Signed: 09/13/2019 5:14:23 PM By: [name] MD Previous Signature: 09/12/2019 5:45:55 PM Version By: [name] MD Entered By: Zandra Abts on 09/12/2019 17:49:52

## 2019-09-14 DIAGNOSIS — E1142 Type 2 diabetes mellitus with diabetic polyneuropathy: Secondary | ICD-10-CM | POA: Diagnosis not present

## 2019-09-14 DIAGNOSIS — B351 Tinea unguium: Secondary | ICD-10-CM | POA: Diagnosis not present

## 2019-09-14 DIAGNOSIS — M79674 Pain in right toe(s): Secondary | ICD-10-CM | POA: Diagnosis not present

## 2019-09-14 DIAGNOSIS — M79675 Pain in left toe(s): Secondary | ICD-10-CM | POA: Diagnosis not present

## 2019-09-19 ENCOUNTER — Encounter (HOSPITAL_BASED_OUTPATIENT_CLINIC_OR_DEPARTMENT_OTHER): Payer: PPO | Admitting: Internal Medicine

## 2019-09-19 ENCOUNTER — Other Ambulatory Visit: Payer: Self-pay

## 2019-09-19 DIAGNOSIS — E11621 Type 2 diabetes mellitus with foot ulcer: Secondary | ICD-10-CM | POA: Diagnosis not present

## 2019-09-19 DIAGNOSIS — I87311 Chronic venous hypertension (idiopathic) with ulcer of right lower extremity: Secondary | ICD-10-CM | POA: Diagnosis not present

## 2019-09-19 DIAGNOSIS — L97812 Non-pressure chronic ulcer of other part of right lower leg with fat layer exposed: Secondary | ICD-10-CM | POA: Diagnosis not present

## 2019-09-19 DIAGNOSIS — L97212 Non-pressure chronic ulcer of right calf with fat layer exposed: Secondary | ICD-10-CM | POA: Diagnosis not present

## 2019-09-20 NOTE — Progress Notes (Signed)
Dennis, Wyatt ([id number]) Visit Report for 09/19/2019 HPI Details Patient Name: Date of Service: Dennis Wyatt, Dennis Wyatt 09/19/2019 1:30 PM Medical Record Number:[id number] Patient Account Number: [id number] Date of Birth/Sex: Treating RN: 09/09/1960 (59 y.o. Dennis Wyatt Primary Care Provider: Kaleen Wyatt Other Clinician: Referring Provider: Treating Provider/Extender:Dennis Wyatt, Dennis Wyatt, Dennis Wyatt in Treatment: 7 History of Present Illness HPI Description: ADMISSION 08/01/2019 This is a 59 year old man who is essentially self-referred although he gets his primary care at [facility]. We did not have any information from them. He is here for review of 3 wounds on the right lower leg. He states about a month and a half ago he dropped his cell phone on the right anterior leg creating a wound. More recently he has had 2 blisters come out 1 medially just above the lateral malleolus and one on the posterior calf. All of these are roughly the same condition however. He has been applying some form of ointment were not really sure what this is. He does not have a wound history. 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 ABI in our clinic was 0.95 1/25; the patient has 3 wounds on the right leg. One is on the right anterior 1 just above the lateral malleolus and one on the posterior calf. We have been using Iodoflex under compression. The base of these wounds appears better 2/1; still the 3 wounds one on the right anterior 1 on the lateral and 1 on the posterior calf we have been using Iodoflex under compression making some general improvement in the wound bed. The patient went for his reflux studies on 1/28. This showed reflux in the common femoral vein and greater than 1 second in  the greater saphenous vein at the saphenofemoral junction but nothing in the more superficial veins below the knee either greater or lesser saphenous. I therefore I do not think he is going to be M amenable for an ablation. The report did note an aging consistent deep venous thrombosis involving the right popliteal artery. This led me to research [place] link. He had a similar finding on a an arterial duplex study in 2014 at which time he was recovering from a subarachnoid hemorrhage. This was seen by the rehab team who admitted him to rehab after the surgery for an anterior cerebral aneurysm. By that time it was felt safe to anticoagulate him and he was put on Xarelto and continued that according to the patient to the last year or 2 when it was stopped and he is only on aspirin. 2/8; all of his wounds appear to be making some progress. We have been using Iodoflex. Base of the wounds appear to be cleaning up nicely but still requiring debridement. We have tried to put Apligraf through his insurance related to deep punched out chronic venous insufficiency wounds and a type II diabetic. We will see if this is affordable 2/15; we are using Iodoflex on the wound areas. Everything looks like it is getting better the anterior and posterior wounds on the right contracted however the area on the lateral side is about the same size. We did not hear anything about Apligraf. 2/22; patient has a $200 per application out-of-pocket co-pay for Apligraf. This was beyond his means. He has good edema control 3/1; one of his wounds on the posterior calf is healed. The areas anteriorly  and laterally look healthy and are contracted. We are using Hydrofera Blue under compression 3/8; the patient's wound is on the anterior and lateral right leg. We are using Hydrofera Blue under compression making good progress Electronic Signature(s) Signed: 09/19/2019 5:29:15 PM By: Dennis Wyatt, Synda Bagent MD Entered By: Dennis Wyatt, Dennis Wyatt  on 09/19/2019 15:26:29 -------------------------------------------------------------------------------- Physical Exam Details Patient Name: Date of Service: Dennis Wyatt, Dennis Wyatt 09/19/2019 1:30 PM Medical Record [id number] Patient Account Number: [id number] Date of Birth/Sex: Treating RN: [birth year] (58 y.o. Dennis KollerM) Wyatt, Dennis Primary Care Provider: [name], Dennis Wyatt Other Clinician: Referring Provider: Treating Provider/Extender:Dennis Wyatt, Dennis SarahMichael Wyatt, Dennis RimWilson Wyatt Wyatt in Treatment: 7 Constitutional Sitting or standing Blood Pressure is within target range for patient.. Pulse regular and within target range for patient.Marland Kitchen. Respirations regular, non-labored and within target range.. Temperature is normal and within the target range for the patient.Marland Kitchen. Appears in no distress. Respiratory work of breathing is normal. Cardiovascular Pedal pulses palpable and strong bilaterally.. Integumentary (Hair, Skin) Changes of chronic venous insufficiency. Psychiatric appears at normal baseline. Notes Wound exam; major wound on the lower leg on the right laterally and anteriorly. The posterior wound is closed surface of these wounds is healthy no debridement is required. Surface area improved Electronic Signature(s) Signed: 09/19/2019 5:29:15 PM By: Dennis Wyatt, Mahum Betten MD Entered By: Dennis Wyatt, Jakyrie Totherow on 09/19/2019 15:28:31 -------------------------------------------------------------------------------- Physician Orders Details Patient Name: Date of Service: Dennis Wyatt, Dennis Wyatt 09/19/2019 1:30 PM Medical Record [id number] Patient Account Number: [id number] Date of Birth/Sex: Treating RN: [birth year] (58 y.o. Dennis KollerM) Wyatt, Dennis Primary Care Provider: [name], Dennis Wyatt Other Clinician: Referring Provider: Treating Provider/Extender:Dennis Wyatt, Dennis SarahMichael Wyatt, Dennis RimWilson Wyatt Wyatt in Treatment: 7 Verbal / Phone Orders: No Diagnosis Coding ICD-10 Coding Code Description [phone] Non-pressure chronic  ulcer of other part of right lower leg with fat layer exposed I87.331 Chronic venous hypertension (idiopathic) with ulcer and inflammation of right lower extremity E11.622 Type 2 diabetes mellitus with other skin ulcer E11.42 Type 2 diabetes mellitus with diabetic polyneuropathy Follow-up Appointments Return Appointment in 1 week. Dressing Change Frequency Do not change entire dressing for one week. Skin Barriers/Peri-Wound Care Moisturizing lotion TCA Cream or Ointment - mixed with lotion Wound Cleansing May shower with protection. - may use cast protector Primary Wound Dressing Wound #1 Right,Anterior Lower Leg Hydrofera Blue - Classic Wound #2 Right,Lateral Lower Leg Hydrofera Blue - Classic Secondary Dressing Wound #1 Right,Anterior Lower Leg ABD pad Zetuvit or Kerramax Wound #2 Right,Lateral Lower Leg ABD pad Zetuvit or Kerramax Edema Control 3 Layer Compression System - Right Lower Extremity Avoid standing for long periods of time Elevate legs to the level of the heart or above for 30 minutes daily and/or when sitting, a frequency of: - throughout the day Exercise regularly Electronic Signature(s) Signed: 09/19/2019 5:29:15 PM By: Dennis Wyatt, Nekita Pita MD Signed: 09/19/2019 5:59:01 PM By: Zandra AbtsLynch, Shatara RN, BSN Entered By: Zandra AbtsLynch, Dennis on 09/19/2019 14:52:07 -------------------------------------------------------------------------------- Problem List Details Patient Name: Date of Service: Dennis Wyatt, Dennis Wyatt 09/19/2019 1:30 PM Medical Record [id number] Patient Account Number: [id number] Date of Birth/Sex: Treating RN: [birth year] (58 y.o. Dennis KollerM) Wyatt, Dennis Primary Care Provider: [name], Dennis Wyatt Other Clinician: Referring Provider: Treating Provider/Extender:Laniece Hornbaker, Dennis SarahMichael Wyatt, Dennis RimWilson Wyatt Wyatt in Treatment: 7 Active Problems ICD-10 Evaluated Encounter Code Description Active Date Today Diagnosis L97.812 Non-pressure chronic ulcer of other part of right  lower 08/01/2019 No Yes leg with fat layer exposed I87.331 Chronic venous hypertension (idiopathic) with ulcer 08/01/2019 No Yes and inflammation of right lower extremity E11.622 Type 2 diabetes mellitus with other skin ulcer 08/01/2019 No Yes E11.42 Type 2  diabetes mellitus with diabetic polyneuropathy 08/01/2019 No Yes Inactive Problems Resolved Problems Electronic Signature(s) Signed: 09/19/2019 5:29:15 PM By: Dennis Najjar MD Entered By: Dennis Najjar on 09/19/2019 15:25:52 -------------------------------------------------------------------------------- Progress Note Details Patient Name: Date of Service: Dennis Wyatt, Dennis Wyatt 09/19/2019 1:30 PM Medical Record [id number] Patient Account Number: [id number] Date of Birth/Sex: Treating RN: 1960/08/26 (58 y.o. Dennis Wyatt Primary Care Provider: Kaleen Wyatt Other Clinician: Referring Provider: Treating Provider/Extender:Seren Chaloux, Dennis Wyatt, Dennis Wyatt in Treatment: 7 Subjective History of Present Illness (HPI) ADMISSION 08/01/2019 This is a 59 year old man who is essentially self-referred although he gets his primary care at [facility]. We did not have any information from them. He is here for review of 3 wounds on the right lower leg. He states about a month and a half ago he dropped his cell phone on the right anterior leg creating a wound. More recently he has had 2 blisters come out 1 medially just above the lateral malleolus and one on the posterior calf. All of these are roughly the same condition however. He has been applying some form of ointment were not really sure what this is. He does not have a wound history. 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 ABI in our clinic was 0.95 1/25; the patient has  3 wounds on the right leg. One is on the right anterior 1 just above the lateral malleolus and one on the posterior calf. We have been using Iodoflex under compression. The base of these wounds appears better 2/1; still the 3 wounds one on the right anterior 1 on the lateral and 1 on the posterior calf we have been using Iodoflex under compression making some general improvement in the wound bed. The patient went for his reflux studies on 1/28. This showed reflux in the common femoral vein and greater than 1 second in the greater saphenous vein at the saphenofemoral junction but nothing in the more superficial veins below the knee either greater or lesser saphenous. I therefore I do not think he is going to be M amenable for an ablation. The report did note an aging consistent deep venous thrombosis involving the right popliteal artery. This led me to research [place] link. He had a similar finding on a an arterial duplex study in 2014 at which time he was recovering from a subarachnoid hemorrhage. This was seen by the rehab team who admitted him to rehab after the surgery for an anterior cerebral aneurysm. By that time it was felt safe to anticoagulate him and he was put on Xarelto and continued that according to the patient to the last year or 2 when it was stopped and he is only on aspirin. 2/8; all of his wounds appear to be making some progress. We have been using Iodoflex. Base of the wounds appear to be cleaning up nicely but still requiring debridement. We have tried to put Apligraf through his insurance related to deep punched out chronic venous insufficiency wounds and a type II diabetic. We will see if this is affordable 2/15; we are using Iodoflex on the wound areas. Everything looks like it is getting better the anterior and posterior wounds on the right contracted however the area on the lateral side is about the same size. We did not hear anything about Apligraf. 2/22; patient  has a $200 per application out-of-pocket co-pay for  Apligraf. This was beyond his means. He has good edema control 3/1; one of his wounds on the posterior calf is healed. The areas anteriorly and laterally look healthy and are contracted. We are using Hydrofera Blue under compression 3/8; the patient's wound is on the anterior and lateral right leg. We are using Hydrofera Blue under compression making good progress Objective Constitutional Sitting or standing Blood Pressure is within target range for patient.. Pulse regular and within target range for patient.Marland Kitchen Respirations regular, non-labored and within target range.. Temperature is normal and within the target range for the patient.Marland Kitchen Appears in no distress. Vitals Time Taken: 1:59 PM, Height: 70 in, Weight: 302 lbs, BMI: 43.3, Temperature: 98.1 F, Pulse: 66 bpm, Respiratory Rate: 19 breaths/min, Blood Pressure: 141/70 mmHg. Respiratory work of breathing is normal. Cardiovascular Pedal pulses palpable and strong bilaterally.Marland Kitchen Psychiatric appears at normal baseline. General Notes: Wound exam; major wound on the lower leg on the right laterally and anteriorly. The posterior wound is closed surface of these wounds is healthy no debridement is required. Surface area improved Integumentary (Hair, Skin) Changes of chronic venous insufficiency. Wound #1 status is Open. Original cause of wound was Trauma. The wound is located on the Right,Anterior Lower Leg. The wound measures 2.7cm length x 1.7cm width x 0.1cm depth; 3.605cm^2 area and 0.36cm^3 volume. There is Fat Layer (Subcutaneous Tissue) Exposed exposed. There is no tunneling or undermining noted. There is a small amount of serosanguineous drainage noted. The wound margin is flat and intact. There is large (67-100%) red granulation within the wound bed. There is a small (1-33%) amount of necrotic tissue within the wound bed including Adherent Slough. Wound #2 status is Open. Original  cause of wound was Gradually Appeared. The wound is located on the Right,Lateral Lower Leg. The wound measures 1.8cm length x 1.2cm width x 0.2cm depth; 1.696cm^2 area and 0.339cm^3 volume. There is Fat Layer (Subcutaneous Tissue) Exposed exposed. There is no tunneling or undermining noted. There is a small amount of serosanguineous drainage noted. The wound margin is flat and intact. There is medium (34-66%) red granulation within the wound bed. There is a medium (34-66%) amount of necrotic tissue within the wound bed including Adherent Slough. Assessment Active Problems ICD-10 Non-pressure chronic ulcer of other part of right lower leg with fat layer exposed Chronic venous hypertension (idiopathic) with ulcer and inflammation of right lower extremity Type 2 diabetes mellitus with other skin ulcer Type 2 diabetes mellitus with diabetic polyneuropathy Procedures Wound #1 Pre-procedure diagnosis of Wound #1 is a Venous Leg Ulcer located on the Right,Anterior Lower Leg . There was a Three Layer Compression Therapy Procedure by [name], RN. Post procedure Diagnosis Wound #1: Same as Pre-Procedure Wound #2 Pre-procedure diagnosis of Wound #2 is a Venous Leg Ulcer located on the Right,Lateral Lower Leg . There was a Three Layer Compression Therapy Procedure by [name], RN. Post procedure Diagnosis Wound #2: Same as Pre-Procedure Plan Follow-up Appointments: Return Appointment in 1 week. Dressing Change Frequency: Do not change entire dressing for one week. Skin Barriers/Peri-Wound Care: Moisturizing lotion TCA Cream or Ointment - mixed with lotion Wound Cleansing: May shower with protection. - may use cast protector Primary Wound Dressing: Wound #1 Right,Anterior Lower Leg: Hydrofera Blue - Classic Wound #2 Right,Lateral Lower Leg: Hydrofera Blue - Classic Secondary Dressing: Wound #1 Right,Anterior Lower Leg: ABD pad Zetuvit or Kerramax Wound #2 Right,Lateral Lower  Leg: ABD pad Zetuvit or Kerramax Edema Control: 3 Layer Compression System - Right Lower Extremity  Avoid standing for long periods of time Elevate legs to the level of the heart or above for 30 minutes daily and/or when sitting, a frequency of: - throughout the day Exercise regularly 1. Hydrofera Blue ABDs/3 layer compression Electronic Signature(s) Signed: 09/19/2019 5:29:15 PM By: Dennis Najjar MD Entered By: Dennis Najjar on 09/19/2019 15:29:44 -------------------------------------------------------------------------------- SuperBill Details Patient Name: Date of Service: Dennis Wyatt, Dennis Wyatt 09/19/2019 Medical Record Number:[id number] Patient Account Number: [id number] Date of Birth/Sex: Treating RN: Jan 29, 1961 (58 y.o. Dennis Wyatt Primary Care Provider: Kaleen Wyatt Other Clinician: Referring Provider: Treating Provider/Extender:Aizlynn Digilio, Dennis Wyatt, Dennis Wyatt in Treatment: 7 Diagnosis Coding ICD-10 Codes Code Description [phone] Non-pressure chronic ulcer of other part of right lower leg with fat layer exposed I87.331 Chronic venous hypertension (idiopathic) with ulcer and inflammation of right lower extremity E11.622 Type 2 diabetes mellitus with other skin ulcer E11.42 Type 2 diabetes mellitus with diabetic polyneuropathy Facility Procedures CPT4 Code Description: [id number] (Facility Use Only) [phone] - APPLY MULTLAY COMPRS LWR RT LEG Modifier: Quantity: 1 Physician Procedures CPT4 Code Description: [id number] - WC PHYS LEVEL 3 - EST PT ICD-10 Diagnosis Description L97.812 Non-pressure chronic ulcer of other part of right lower le I87.331 Chronic venous hypertension (idiopathic) with ulcer and in lower extremity Modifier: g with fat layer flammation of ri Quantity: 1 exposed ght Electronic Signature(s) Signed: 09/19/2019 5:59:01 PM By: Zandra Abts RN, BSN Signed: 09/20/2019 5:48:31 PM By: Dennis Najjar MD Previous Signature: 09/19/2019 5:29:15  PM Version By: Dennis Najjar MD Entered By: Zandra Abts on 09/19/2019 17:43:53

## 2019-09-23 DIAGNOSIS — F17210 Nicotine dependence, cigarettes, uncomplicated: Secondary | ICD-10-CM | POA: Diagnosis not present

## 2019-09-23 DIAGNOSIS — J449 Chronic obstructive pulmonary disease, unspecified: Secondary | ICD-10-CM | POA: Diagnosis not present

## 2019-09-23 DIAGNOSIS — R0602 Shortness of breath: Secondary | ICD-10-CM | POA: Diagnosis not present

## 2019-09-26 ENCOUNTER — Encounter (HOSPITAL_BASED_OUTPATIENT_CLINIC_OR_DEPARTMENT_OTHER): Payer: PPO | Admitting: Internal Medicine

## 2019-09-26 ENCOUNTER — Other Ambulatory Visit: Payer: Self-pay

## 2019-09-26 DIAGNOSIS — E11621 Type 2 diabetes mellitus with foot ulcer: Secondary | ICD-10-CM | POA: Diagnosis not present

## 2019-09-26 DIAGNOSIS — E11622 Type 2 diabetes mellitus with other skin ulcer: Secondary | ICD-10-CM | POA: Diagnosis not present

## 2019-09-26 DIAGNOSIS — L97812 Non-pressure chronic ulcer of other part of right lower leg with fat layer exposed: Secondary | ICD-10-CM | POA: Diagnosis not present

## 2019-09-26 DIAGNOSIS — E1142 Type 2 diabetes mellitus with diabetic polyneuropathy: Secondary | ICD-10-CM | POA: Diagnosis not present

## 2019-09-26 DIAGNOSIS — I87331 Chronic venous hypertension (idiopathic) with ulcer and inflammation of right lower extremity: Secondary | ICD-10-CM | POA: Diagnosis not present

## 2019-09-26 NOTE — Progress Notes (Addendum)
Dennis Wyatt, Dennis Wyatt ([id number]) Visit Report for 09/26/2019 Arrival Information Details Patient Name: Date of Service: Dennis Wyatt, Dennis Wyatt 09/26/2019 2:00 PM Medical Record Number:[id number] Patient Account Number: [id number] Date of Birth/Sex: Treating RN: 01/21/1961 (59 y.o. [name], [name] Primary Care Laquashia Mergenthaler: [name] Other Clinician: Referring Preslei Blakley: Treating Adrien Shankar/Extender:[name], [name], [name]: 8 Visit Information History Since Last Visit Cane Added or deleted any medications: No Patient Arrived: 13:54 Any new allergies or adverse reactions: No Arrival Time: Had a fall or experienced change in No Accompanied By: self None activities of daily living that may affect Transfer Assistance: risk of falls: Patient Identification Verified: Yes Signs or symptoms of abuse/neglect since last No Secondary Verification Process Completed: Yes visito Patient Requires Transmission-Based No Hospitalized since last visit: No Precautions: Implantable device outside of the clinic excluding No Patient Has Alerts: No cellular tissue based products placed in the center since last visit: Has Dressing in Place as Prescribed: Yes Has Compression in Place as Prescribed: Yes Pain Present Now: No Electronic Signature(s) Signed: 09/26/2019 5:25:09 PM By: Shawn Stall Entered By: Shawn Stall on 09/26/2019 13:57:02 -------------------------------------------------------------------------------- Compression Therapy Details Patient Name: Date of Service: Dennis Wyatt, Dennis Wyatt 09/26/2019 2:00 PM Medical Record [id number] Patient Account Number: [id number] Date of Birth/Sex: Treating RN: [birth year] (59 y.o. [name] Primary Care Sholom Dulude: [name] Other Clinician: Referring Ronnae Kaser: Treating Renan Danese/Extender:[name], [name], [name]: 8 Compression Therapy Performed for Wound Wound #1  Right,Anterior Lower Leg Assessment: Performed By: Clinician Zandra Abts, RN Compression Type: Three Layer Post Procedure Diagnosis Same as Pre-procedure Electronic Signature(s) Signed: 09/26/2019 5:44:18 PM By: Zandra Abts RN, BSN Entered By: Zandra Abts on 09/26/2019 14:16:02 -------------------------------------------------------------------------------- Compression Therapy Details Patient Name: Date of Service: Dennis Wyatt, Dennis Wyatt 09/26/2019 2:00 PM Medical Record [id number] Patient Account Number: [id number] Date of Birth/Sex: Treating RN: December 30, 1960 (59 y.o. [name] Primary Care Ellen Goris: [name] Other Clinician: Referring Aubra Pappalardo: Treating Brelynn Wheller/Extender:[name], [name], [name]: 8 Compression Therapy Performed for Wound Wound #2 Right,Lateral Lower Leg Assessment: Performed By: Clinician Zandra Abts, RN Compression Type: Three Layer Post Procedure Diagnosis Same as Pre-procedure Electronic Signature(s) Signed: 09/26/2019 5:44:18 PM By: Zandra Abts RN, BSN Entered By: Zandra Abts on 09/26/2019 14:16:02 -------------------------------------------------------------------------------- Encounter Discharge Information Details Patient Name: Date of Service: Dennis Wyatt, Dennis Wyatt 09/26/2019 2:00 PM Medical Record [id number] Patient Account Number: [id number] Date of Birth/Sex: Treating RN: 11-24-1960 (59 y.o. Tammy Sours Primary Care Rayola Everhart: [name] Other Clinician: Referring Mary Hockey: Treating Etheleen Valtierra/Extender:[name], [name], [name]: 8 Encounter Discharge Information Items Discharge Condition: Stable Ambulatory Status: Cane Discharge Destination: Home Transportation: Private Auto Accompanied By: self Schedule Follow-up Appointment: Yes Clinical Summary of Care: Electronic Signature(s) Signed: 09/26/2019 5:25:09 PM By: Shawn Stall Entered By: Shawn Stall on 09/26/2019 14:49:11 -------------------------------------------------------------------------------- Lower Extremity Assessment Details Patient Name: Date of Service: Dennis Wyatt, Dennis Wyatt 09/26/2019 2:00 PM Medical Record [id number] Patient Account Number: [id number] Date of Birth/Sex: Treating RN: [birth year] (59 y.o. Tammy Sours Primary Care Anaija Wissink: [name] Other Clinician: Referring Kasai Beltran: Treating Jaisean Monteforte/Extender:[name], [name], [name]: 8 Edema Assessment Assessed: [Left: No] [Right: Yes] Edema: [Left: Ye] [Right: s] Calf Left: Right: Point of Measurement: 36 cm From Medial Instep cm 37 cm Ankle Left: Right: Point of Measurement: 9 cm From Medial Instep cm 24 cm Vascular Assessment Pulses: Dorsalis Pedis Palpable: [Right:Yes] Electronic Signature(s) Signed: 09/26/2019 5:25:09 PM By: Shawn Stall Entered By: Shawn Stall on 09/26/2019 14:00:40 --------------------------------------------------------------------------------  Multi Wound Chart Details Patient Name: Date of Service: Dennis Wyatt, Dennis Wyatt 09/26/2019 2:00 PM Medical Record [id number] Patient Account Number: [id number] Date of Birth/Sex: Treating RN: [birth year] (59 y.o. [name] Primary Care Nihaal Friesen: [name] Other Clinician: Referring Tippi Mccrae: Treating Cerinity Zynda/Extender:[name], [name], [name] Weeks in Treatment: 8 Vital Signs Height(in): 70 Pulse(bpm): 33 Weight(lbs): 302 Blood Pressure(mmHg): 158/80 Body Mass Index(BMI): 43 Temperature(F): 97.5 Respiratory 18 Rate(breaths/min): Photos: [1:No Photos] [2:No Photos] [N/A:N/A] Wound Location: [1:Right Lower Leg - Anterior Right Lower Leg - Lateral N/A] Wounding Event: [1:Trauma] [2:Gradually Appeared] [N/A:N/A] Primary Etiology: [1:Venous Leg Ulcer] [2:Venous Leg Ulcer] [N/A:N/A] Secondary Etiology: [1: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 Lower Extremity] [2:Lower Extremity] Comorbid History: [1:Deep Vein Thrombosis, Hypertension, Type II Diabetes, Osteoarthritis] [2:Deep Vein Thrombosis, Hypertension, Type II Diabetes, Osteoarthritis] [N/A:N/A] Date Acquired: [1:06/14/2019] [2:07/11/2019] [N/A:N/A] Weeks of Treatment: [1:8] [2:8] [N/A:N/A] Wound Status: [1:Open] [2:Open] [N/A:N/A] Measurements L x W x D 1.8x1.2x0.1 [2:1.7x0.7x0.1] [N/A:N/A] (cm) Area (cm) : [1:1.696] [2:0.935] [N/A:N/A] Volume (cm) : [1:0.17] [2:0.093] [N/A:N/A] % Reduction in Area: [1:79.20%] [2:71.70%] [N/A:N/A] % Reduction in Volume: 89.60% [2:90.60%] [N/A:N/A] Classification: [1:Full Thickness Without Exposed Support Structures Exposed Support Structures] [2:Full Thickness Without] [N/A:N/A] Exudate Amount: [1:Small] [2:Small] [N/A:N/A] Exudate Type: [1:Serosanguineous] [2:Serosanguineous] [N/A:N/A] Exudate Color: [1:red, brown] [2:red, brown] [N/A:N/A] Wound Margin: [1:Flat and Intact] [2:Flat and Intact] [N/A:N/A] Granulation Amount: [1:Large (67-100%)] [2:Large (67-100%)] [N/A:N/A] Granulation Quality: [1:Red, Friable] [2:Red, Friable] [N/A:N/A] Necrotic Amount: [1:None Present (0%)] [2:None Present (0%)] [N/A:N/A] Exposed Structures: [1:Fat Layer (Subcutaneous Fat Layer (Subcutaneous N/A Tissue) Exposed: Yes Fascia: No Tendon: No Muscle: No Joint: No Bone: No] [2:Tissue) Exposed: Yes Fascia: No Tendon: No Muscle: No Joint: No Bone: No] Epithelialization: [1:Medium (34-66%)] [2:Medium (34-66%) Compression Therapy] [N/A:N/A N/A] Treatment Notes Wound #1 (Right, Anterior Lower Leg) 1. Cleanse With Wound Cleanser Soap and water 2. Periwound Care Moisturizing lotion TCA Cream 3. Primary Dressing Applied Hydrofera Blue 4. Secondary Dressing Dry Gauze 6. Support Layer Applied 3 layer compression wrap Notes netting. Wound #2 (Right, Lateral Lower Leg) 1. Cleanse With Wound Cleanser Soap and water 2.  Periwound Care Moisturizing lotion TCA Cream 3. Primary Dressing Applied Hydrofera Blue 4. Secondary Dressing Dry Gauze 6. Support Layer Applied 3 layer compression wrap Notes netting. Electronic Signature(s) Signed: 09/27/2019 10:32:20 AM By: Linton Ham MD Signed: 10/26/2019 9:03:51 AM By: [name] RN, BSN Entered By: Linton Ham on 09/27/2019 07:22:06 -------------------------------------------------------------------------------- Multi-Disciplinary Care Plan Details Patient Name: Date of Service: Dennis Wyatt, Dennis Wyatt 09/26/2019 2:00 PM Medical Record [id number] Patient Account Number: [id number] Date of Birth/Sex: Treating RN: 09-02-1960 (59 y.o. [name] Primary Care Ilean Spradlin: [name] Other Clinician: Referring Coreon Simkins: Treating Angelli Baruch/Extender:[name], [name], [name] Weeks in Treatment: 8 Active Inactive Wound/Skin Impairment Nursing Diagnoses: Impaired tissue integrity Knowledge deficit related to smoking impact on wound healing Knowledge deficit related to ulceration/compromised skin integrity Goals: Patient/caregiver will verbalize understanding of skin care regimen Date Initiated: 08/01/2019 Target Resolution Date: 10/28/2019 Goal Status: Active Interventions: Assess patient/caregiver ability to obtain necessary supplies Assess patient/caregiver ability to perform ulcer/skin care regimen upon admission and as needed Assess ulceration(s) every visit Provide education on ulcer and skin care Notes: Electronic Signature(s) Signed: 09/26/2019 5:44:18 PM By: [name] RN, BSN Entered By: [name] on 09/26/2019 14:07:46 -------------------------------------------------------------------------------- Pain Assessment Details Patient Name: Date of Service: Dennis Wyatt, Dennis Wyatt 09/26/2019 2:00 PM Medical Record [id number] Patient Account Number: [id number] Date of Birth/Sex: Treating RN: 1961-04-05 (59 y.o.  [name] Primary Care Manhattan Mccuen: [name]  Other Clinician: Referring Elfreda Blanchet: Treating Jazir Newey/Extender:[name], [name], [name]: 8 Active Problems Location of Pain Severity and Description of Pain Patient Has Paino No Site Locations Rate the pain. Current Pain Level: 0 Pain Management and Medication Current Pain Management: Electronic Signature(s) Signed: 09/26/2019 5:25:09 PM By: Shawn Stall Entered By: Shawn Stall on 09/26/2019 13:57:11 -------------------------------------------------------------------------------- Patient/Caregiver Education Details Patient Name: Date of Service: Dennis Wyatt, Dennis Wyatt 3/15/2021andnbsp2:00 PM Medical Record [id number] Patient Account Number: [id number] Date of Birth/Gender: Treating RN: [birth year] (59 y.o. [name] Primary Care Physician: [name] Other Clinician: Referring Physician: Treating Physician/Extender:[name], [name], [name]: 8 Education Assessment Education Provided To: Patient Education Topics Provided Wound/Skin Impairment: Methods: Explain/Verbal Responses: State content correctly Electronic Signature(s) Signed: 09/26/2019 5:44:18 PM By: Zandra Abts RN, BSN Entered By: Zandra Abts on 09/26/2019 14:07:56 -------------------------------------------------------------------------------- Wound Assessment Details Patient Name: Date of Service: Dennis Wyatt, Dennis Wyatt 09/26/2019 2:00 PM Medical Record [id number] Patient Account Number: [id number] Date of Birth/Sex: Treating RN: 1960/10/03 (58 y.o. [name] Primary Care Gizell Danser: [name] Other Clinician: Referring Bach Rocchi: Treating Robby Pirani/Extender:[name], [name], [name]: 8 Wound Status Wound Number: 1 Primary Venous Leg Ulcer Etiology: Wound Location: Right Lower Leg - Anterior Secondary Diabetic  Wound/Ulcer of the Lower Wounding Event: Trauma Etiology: Extremity Date Acquired: 06/14/2019 Wound Open Weeks Of Treatment: 8 Status: Clustered Wound: No Comorbid Deep Vein Thrombosis, Hypertension, Type History: II Diabetes, Osteoarthritis Photos Wound Measurements Length: (cm) 1.8 % Reduct Width: (cm) 1.2 % Reduct Depth: (cm) 0.1 Epitheli Area: (cm) 1.696 Tunneli Volume: (cm) 0.17 Undermi Wound Description Full Thickness Without Exposed Support Foul Odo Classification: Structures Slough/F Wound Flat and Intact Margin: Exudate Small Amount: Exudate Serosanguineous Type: Exudate red, brown Color: Wound Bed Granulation Amount: Large (67-100%) Granulation Quality: Red, Friable Fascia E Necrotic Amount: None Present (0%) Fat Laye Tendon E Muscle E Joint Ex Bone Exp r After Cleansing: No ibrino Yes Exposed Structure xposed: No r (Subcutaneous Tissue) Exposed: Yes xposed: No xposed: No posed: No osed: No ion in Area: 79.2% ion in Volume: 89.6% alization: Medium (34-66%) ng: No ning: No Electronic Signature(s) Signed: 09/27/2019 4:45:30 PM By: Benjaman Kindler EMT/HBOT Signed: 10/26/2019 9:03:51 AM By: Zandra Abts RN, BSN Previous Signature: 09/26/2019 5:25:09 PM Version By: Shawn Stall Entered By: Benjaman Kindler on 09/27/2019 11:16:35 -------------------------------------------------------------------------------- Wound Assessment Details Patient Name: Date of Service: Dennis Wyatt, Dennis Wyatt 09/26/2019 2:00 PM Medical Record [id number] Patient Account Number: [id number] Date of Birth/Sex: Treating RN: [birth year] (58 y.o. [name] Primary Care Dorie Ohms: [name] Other Clinician: Referring Bishoy Cupp: Treating Areg Bialas/Extender:[name], [name], [name]: 8 Wound Status Wound Number: 2 Primary Venous Leg Ulcer Etiology: Wound Location: Right, Lateral Lower Leg Secondary Diabetic Wound/Ulcer of the  Lower Wounding Event: Gradually Appeared Etiology: Extremity Date Acquired: 07/11/2019 Wound Open Weeks Of Treatment: 8 Status: Clustered Wound: No Comorbid Deep Vein Thrombosis, Hypertension, Type History: II Diabetes, Osteoarthritis Photos Wound Measurements Length: (cm) 1.7 % Reduct Width: (cm) 0.7 % Reduct Depth: (cm) 0.1 Epitheli Area: (cm) 0.935 Tunneli Volume: (cm) 0.093 Undermi Wound Description Classification: Full Thickness Without Exposed Support Foul Odo Structures Slough/F Wound Flat and Intact Margin: Exudate Small Amount: Exudate Serosanguineous Type: Exudate red, brown Color: Wound Bed Granulation Amount: Large (67-100%) Granulation Quality: Red, Friable Fascia E Necrotic Amount: None Present (0%) Fat Laye Tendon E Muscle E Joint Ex Bone Exp r After Cleansing: No ibrino No Exposed Structure xposed: No r (Subcutaneous Tissue) Exposed: Yes  xposed: No xposed: No posed: No osed: No ion in Area: 71.7% ion in Volume: 90.6% alization: Medium (34-66%) ng: No ning: No Electronic Signature(s) Signed: 09/27/2019 4:45:30 PM By: Benjaman Kindler EMT/HBOT Signed: 10/26/2019 9:03:51 AM By: Zandra Abts RN, BSN Previous Signature: 09/26/2019 5:25:09 PM Version By: Shawn Stall Entered By: Benjaman Kindler on 09/27/2019 11:17:14 -------------------------------------------------------------------------------- Vitals Details Patient Name: Date of Service: Dennis Wyatt, BRODERSEN 09/26/2019 2:00 PM Medical Record [id number] Patient Account Number: [id number] Date of Birth/Sex: Treating RN: July 03, 1961 (58 y.o. [name], [name] Primary Care Anum Palecek: [name] Other Clinician: Referring Braylyn Eye: Treating Sherlin Sonier/Extender:[name], [name], [name]: 8 Vital Signs Time Taken: 13:57 Temperature (F): 97.5 Height (in): 70 Pulse (bpm): 89 Weight (lbs): 302 Respiratory Rate (breaths/min): 18 Body Mass Index (BMI):  43.3 Blood Pressure (mmHg): 158/80 Reference Range: 80 - 120 mg / dl Electronic Signature(s) Signed: 09/26/2019 5:25:09 PM By: Shawn Stall Entered By: Shawn Stall on 09/26/2019 14:00:30

## 2019-09-27 DIAGNOSIS — Z79899 Other long term (current) drug therapy: Secondary | ICD-10-CM | POA: Diagnosis not present

## 2019-09-27 DIAGNOSIS — M25561 Pain in right knee: Secondary | ICD-10-CM | POA: Diagnosis not present

## 2019-09-27 DIAGNOSIS — M545 Low back pain: Secondary | ICD-10-CM | POA: Diagnosis not present

## 2019-09-27 DIAGNOSIS — E1165 Type 2 diabetes mellitus with hyperglycemia: Secondary | ICD-10-CM | POA: Diagnosis not present

## 2019-09-27 DIAGNOSIS — E1140 Type 2 diabetes mellitus with diabetic neuropathy, unspecified: Secondary | ICD-10-CM | POA: Diagnosis not present

## 2019-09-27 DIAGNOSIS — F17210 Nicotine dependence, cigarettes, uncomplicated: Secondary | ICD-10-CM | POA: Diagnosis not present

## 2019-09-27 NOTE — Progress Notes (Signed)
[name], [name] ([id number]) Visit Report for 09/26/2019 HPI Details Patient Name: Date of Service: Dennis Wyatt, Dennis Wyatt 09/26/2019 2:00 PM Medical Record Number:[id number] Patient Account Number: [id number] Date of Birth/Sex: Treating RN: 10-07-1960 (59 y.o. Dennis Wyatt Primary Care Provider: Kaleen Wyatt Other Clinician: Referring Provider: Treating Provider/Extender:Wyatt, Dennis Sarah, Dennis Wyatt Weeks in Treatment: 8 History of Present Illness HPI Description: ADMISSION 08/01/2019 This is a 59 year old man who is essentially self-referred although he gets his primary care at [facility]. We did not have any information from them. He is here for review of 3 wounds on the right lower leg. He states about a month and a half ago he dropped his cell phone on the right anterior leg creating a wound. More recently he has had 2 blisters come out 1 medially just above the lateral malleolus and one on the posterior calf. All of these are roughly the same condition however. He has been applying some form of ointment were not really sure what this is. He does not have a wound history. 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 ABI in our clinic was 0.95 1/25; the patient has 3 wounds on the right leg. One is on the right anterior 1 just above the lateral malleolus and one on the posterior calf. We have been using Iodoflex under compression. The base of these wounds appears better 2/1; still the 3 wounds one on the right anterior 1 on the lateral and 1 on the posterior calf we have been using Iodoflex under compression making some general improvement in the wound bed. The patient went for his reflux studies on 1/28. This showed reflux in the common femoral vein and greater than 1 second  in the greater saphenous vein at the saphenofemoral junction but nothing in the more superficial veins below the knee either greater or lesser saphenous. I therefore I do not think he is going to be M amenable for an ablation. The report did note an aging consistent deep venous thrombosis involving the right popliteal artery. This led me to research [place] link. He had a similar finding on a an arterial duplex study in 2014 at which time he was recovering from a subarachnoid hemorrhage. This was seen by the rehab team who admitted him to rehab after the surgery for an anterior cerebral aneurysm. By that time it was felt safe to anticoagulate him and he was put on Xarelto and continued that according to the patient to the last year or 2 when it was stopped and he is only on aspirin. 2/8; all of his wounds appear to be making some progress. We have been using Iodoflex. Base of the wounds appear to be cleaning up nicely but still requiring debridement. We have tried to put Apligraf through his insurance related to deep punched out chronic venous insufficiency wounds and a type II diabetic. We will see if this is affordable 2/15; we are using Iodoflex on the wound areas. Everything looks like it is getting better the anterior and posterior wounds on the right contracted however the area on the lateral side is about the same size. We did not hear anything about Apligraf. 2/22; patient has a $200 per application out-of-pocket co-pay for Apligraf. This was beyond his means. He has good edema control 3/1; one of his wounds on the posterior calf is healed. The areas anteriorly  and laterally look healthy and are contracted. We are using Hydrofera Blue under compression 3/8; the patient's wound is on the anterior and lateral right leg. We are using Hydrofera Blue under compression making good progress 3/15; the patient's wound is on the anterior and lateral right leg. We are using Hydrofera Blue on  the compression and there is continued improvement in surface area. The wound that was on the posterior calf is still fully epithelialized Electronic Signature(s) Signed: 09/27/2019 10:32:20 AM By: [name] MD Entered By: [name] on 09/27/2019 07:22:40 -------------------------------------------------------------------------------- Physical Exam Details Patient Name: Date of Service: Dennis Wyatt, Dennis Wyatt 09/26/2019 2:00 PM Medical Record [id number] Patient Account Number: [id number] Date of Birth/Sex: Treating RN: [birth year] (59 y.o. Dennis Wyatt Primary Care Provider: Kaleen Wyatt Other Clinician: Referring Provider: Treating Provider/Extender:Wyatt, Dennis Sarah, Dennis Wyatt Weeks in Treatment: 8 Constitutional Patient is hypertensive.. Pulse regular and within target range for patient.Marland Kitchen Respirations regular, non-labored and within target range.. Temperature is normal and within the target range for the patient.Marland Kitchen Appears in no distress. Cardiovascular Needle pulses are palpable. Edema control is quite satisfactory. Integumentary (Hair, Skin) No evidence of surrounding infection. Notes Wound exam; major wound on the lower leg laterally and anteriorly. These are smaller epithelialized with healthy surface area. Debrided with Anasept and gauze, no mechanical debridement is felt to be necessary Electronic Signature(s) Signed: 09/27/2019 10:32:20 AM By: [name] MD Entered By: [name] on 09/27/2019 07:23:34 -------------------------------------------------------------------------------- Physician Orders Details Patient Name: Date of Service: Dennis Wyatt, Dennis Wyatt 09/26/2019 2:00 PM Medical Record [id number] Patient Account Number: [id number] Date of Birth/Sex: Treating RN: [birth year] (59 y.o. Dennis Wyatt Primary Care Provider: Kaleen Wyatt Other Clinician: Referring Provider: Treating Provider/Extender:Wyatt,  Dennis Sarah, Dennis Wyatt Weeks in Treatment: 8 Verbal / Phone Orders: No Diagnosis Coding ICD-10 Coding Code Description [phone] Non-pressure chronic ulcer of other part of right lower leg with fat layer exposed I87.331 Chronic venous hypertension (idiopathic) with ulcer and inflammation of right lower extremity E11.622 Type 2 diabetes mellitus with other skin ulcer E11.42 Type 2 diabetes mellitus with diabetic polyneuropathy Follow-up Appointments Return Appointment in 1 week. Dressing Change Frequency Do not change entire dressing for one week. Skin Barriers/Peri-Wound Care Moisturizing lotion TCA Cream or Ointment - mixed with lotion Wound Cleansing May shower with protection. - may use cast protector Primary Wound Dressing Wound #1 Right,Anterior Lower Leg Hydrofera Blue - Classic Wound #2 Right,Lateral Lower Leg Hydrofera Blue - Classic Secondary Dressing Wound #1 Right,Anterior Lower Leg ABD pad Zetuvit or Kerramax Wound #2 Right,Lateral Lower Leg ABD pad Zetuvit or Kerramax Edema Control 3 Layer Compression System - Right Lower Extremity Avoid standing for long periods of time Elevate legs to the level of the heart or above for 30 minutes daily and/or when sitting, a frequency of: - throughout the day Exercise regularly Electronic Signature(s) Signed: 09/26/2019 5:44:18 PM By: Zandra Abts RN, BSN Signed: 09/27/2019 10:32:20 AM By: [name] MD Entered By: Zandra Abts on 09/26/2019 14:07:25 -------------------------------------------------------------------------------- Problem List Details Patient Name: Date of Service: Dennis Wyatt, Dennis Wyatt 09/26/2019 2:00 PM Medical Record [id number] Patient Account Number: [id number] Date of Birth/Sex: Treating RN: 15-Apr-1961 (58 y.o. Dennis Wyatt Primary Care Provider: Kaleen Wyatt Other Clinician: Referring Provider: Treating Provider/Extender:Wyatt, Dennis Sarah, Dennis Wyatt Weeks in  Treatment: 8 Active Problems ICD-10 Evaluated Encounter Code Description Active Date Today Diagnosis L97.812 Non-pressure chronic ulcer of other part of right lower 08/01/2019 No Yes leg with fat layer exposed I87.331 Chronic venous hypertension (idiopathic) with ulcer  08/01/2019 No Yes and inflammation of right lower extremity E11.622 Type 2 diabetes mellitus with other skin ulcer 08/01/2019 No Yes E11.42 Type 2 diabetes mellitus with diabetic polyneuropathy 08/01/2019 No Yes Inactive Problems Resolved Problems Electronic Signature(s) Signed: 09/27/2019 10:32:20 AM By: [name] MD Previous Signature: 09/26/2019 5:44:18 PM Version By: Zandra Abts RN, BSN Entered By: [name] on 09/27/2019 07:21:55 -------------------------------------------------------------------------------- Progress Note Details Patient Name: Date of Service: Dennis Wyatt, Dennis Wyatt 09/26/2019 2:00 PM Medical Record [id number] Patient Account Number: [id number] Date of Birth/Sex: Treating RN: 13-Aug-1960 (58 y.o. Dennis Wyatt Primary Care Provider: Kaleen Wyatt Other Clinician: Referring Provider: Treating Provider/Extender:Wyatt, Dennis Sarah, Dennis Wyatt Weeks in Treatment: 8 Subjective History of Present Illness (HPI) ADMISSION 08/01/2019 This is a 59 year old man who is essentially self-referred although he gets his primary care at [facility]. We did not have any information from them. He is here for review of 3 wounds on the right lower leg. He states about a month and a half ago he dropped his cell phone on the right anterior leg creating a wound. More recently he has had 2 blisters come out 1 medially just above the lateral malleolus and one on the posterior calf. All of these are roughly the same condition however. He has been applying some form of ointment were not really sure what this is. He does not have a wound history. Past medical history includes cigarette  smoker, left total hip replacement in 2018, subarachnoid hemorrhage from a anterior communicating artery aneurysm in 2014, borderline diabetic but he apparently seemed receives weekly injections. ABI in our clinic was 0.95 1/25; the patient has 3 wounds on the right leg. One is on the right anterior 1 just above the lateral malleolus and one on the posterior calf. We have been using Iodoflex under compression. The base of these wounds appears better 2/1; still the 3 wounds one on the right anterior 1 on the lateral and 1 on the posterior calf we have been using Iodoflex under compression making some general improvement in the wound bed. The patient went for his reflux studies on 1/28. This showed reflux in the common femoral vein and greater than 1 second in the greater saphenous vein at the saphenofemoral junction but nothing in the more superficial veins below the knee either greater or lesser saphenous. I therefore I do not think he is going to be M amenable for an ablation. The report did note an aging consistent deep venous thrombosis involving the right popliteal artery. This led me to research [place] link. He had a similar finding on a an arterial duplex study in 2014 at which time he was recovering from a subarachnoid hemorrhage. This was seen by the rehab team who admitted him to rehab after the surgery for an anterior cerebral aneurysm. By that time it was felt safe to anticoagulate him and he was put on Xarelto and continued that according to the patient to the last year or 2 when it was stopped and he is only on aspirin. 2/8; all of his wounds appear to be making some progress. We have been using Iodoflex. Base of the wounds appear to be cleaning up nicely but still requiring debridement. We have tried to put Apligraf through his insurance related to deep punched out chronic venous insufficiency wounds and a type II diabetic. We will see if this is affordable 2/15; we are using  Iodoflex on the wound areas. Everything looks like it is getting better the anterior and  posterior wounds on the right contracted however the area on the lateral side is about the same size. We did not hear anything about Apligraf. 2/22; patient has a $315 per application out-of-pocket co-pay for Apligraf. This was beyond his means. He has good edema control 3/1; one of his wounds on the posterior calf is healed. The areas anteriorly and laterally look healthy and are contracted. We are using Hydrofera Blue under compression 3/8; the patient's wound is on the anterior and lateral right leg. We are using Hydrofera Blue under compression making good progress 3/15; the patient's wound is on the anterior and lateral right leg. We are using Hydrofera Blue on the compression and there is continued improvement in surface area. The wound that was on the posterior calf is still fully epithelialized Objective Constitutional Patient is hypertensive.. Pulse regular and within target range for patient.Marland Kitchen Respirations regular, non-labored and within target range.. Temperature is normal and within the target range for the patient.Marland Kitchen Appears in no distress. Vitals Time Taken: 1:57 PM, Height: 70 in, Weight: 302 lbs, BMI: 43.3, Temperature: 97.5 F, Pulse: 89 bpm, Respiratory Rate: 18 breaths/min, Blood Pressure: 158/80 mmHg. Cardiovascular Needle pulses are palpable. Edema control is quite satisfactory. General Notes: Wound exam; major wound on the lower leg laterally and anteriorly. These are smaller epithelialized with healthy surface area. Debrided with Anasept and gauze, no mechanical debridement is felt to be necessary Integumentary (Hair, Skin) No evidence of surrounding infection. Wound #1 status is Open. Original cause of wound was Trauma. The wound is located on the Right,Anterior Lower Leg. The wound measures 1.8cm length x 1.2cm width x 0.1cm depth; 1.696cm^2 area and 0.17cm^3 volume. There is  Fat Layer (Subcutaneous Tissue) Exposed exposed. There is no tunneling or undermining noted. There is a small amount of serosanguineous drainage noted. The wound margin is flat and intact. There is large (67-100%) red, friable granulation within the wound bed. There is no necrotic tissue within the wound bed. Wound #2 status is Open. Original cause of wound was Gradually Appeared. The wound is located on the Right,Lateral Lower Leg. The wound measures 1.7cm length x 0.7cm width x 0.1cm depth; 0.935cm^2 area and 0.093cm^3 volume. There is Fat Layer (Subcutaneous Tissue) Exposed exposed. There is no tunneling or undermining noted. There is a small amount of serosanguineous drainage noted. The wound margin is flat and intact. There is large (67-100%) red, friable granulation within the wound bed. There is no necrotic tissue within the wound bed. Assessment Active Problems ICD-10 Non-pressure chronic ulcer of other part of right lower leg with fat layer exposed Chronic venous hypertension (idiopathic) with ulcer and inflammation of right lower extremity Type 2 diabetes mellitus with other skin ulcer Type 2 diabetes mellitus with diabetic polyneuropathy Procedures Wound #1 Pre-procedure diagnosis of Wound #1 is a Venous Leg Ulcer located on the Right,Anterior Lower Leg . There was a Three Layer Compression Therapy Procedure by [name], RN. Post procedure Diagnosis Wound #1: Same as Pre-Procedure Wound #2 Pre-procedure diagnosis of Wound #2 is a Venous Leg Ulcer located on the Right,Lateral Lower Leg . There was a Three Layer Compression Therapy Procedure by [name], RN. Post procedure Diagnosis Wound #2: Same as Pre-Procedure Plan Follow-up Appointments: Return Appointment in 1 week. Dressing Change Frequency: Do not change entire dressing for one week. Skin Barriers/Peri-Wound Care: Moisturizing lotion TCA Cream or Ointment - mixed with lotion Wound Cleansing: May shower  with protection. - may use cast protector Primary Wound Dressing: Wound #1 Right,Anterior  Lower Leg: Hydrofera Blue - Classic Wound #2 Right,Lateral Lower Leg: Hydrofera Blue - Classic Secondary Dressing: Wound #1 Right,Anterior Lower Leg: ABD pad Zetuvit or Kerramax Wound #2 Right,Lateral Lower Leg: ABD pad Zetuvit or Kerramax Edema Control: 3 Layer Compression System - Right Lower Extremity Avoid standing for long periods of time Elevate legs to the level of the heart or above for 30 minutes daily and/or when sitting, a frequency of: - throughout the day Exercise regularly 1. There is no need to change the primary dressing here which is Hydrofera Blue 2. We are making nice progress on the 2 remaining wounds, one of the original 3 wounds on the posterior calf remains healed Electronic Signature(s) Signed: 09/27/2019 10:32:20 AM By: Baltazar Najjarobson, Michael MD Entered By: Baltazar Najjarobson, Michael on 09/27/2019 07:24:02 -------------------------------------------------------------------------------- SuperBill Details Patient Name: Date of Service: Dennis Wyatt, Dennis Wyatt 09/26/2019 Medical Record [id number] Patient Account Number: [id number] Date of Birth/Sex: Treating RN: 1961/01/10 (58 y.o. Dennis KollerM) Lynch, Shatara Primary Care Provider: [name], Wilson Oliver Other Clinician: Referring Provider: Treating Provider/Extender:Wyatt, Dennis SarahMichael Elkins, Dennis RimWilson Oliver Weeks in Treatment: 8 Diagnosis Coding ICD-10 Codes Code Description [phone] Non-pressure chronic ulcer of other part of right lower leg with fat layer exposed I87.331 Chronic venous hypertension (idiopathic) with ulcer and inflammation of right lower extremity E11.622 Type 2 diabetes mellitus with other skin ulcer E11.42 Type 2 diabetes mellitus with diabetic polyneuropathy Facility Procedures CPT4 Code Description: [id number] (Facility Use Only) [id number] - APPLY MULTLAY COMPRS LWR RT LEG Modifier: Quantity: 1 Physician Procedures CPT4 Code  Description: [id number] - WC PHYS LEVEL 3 - EST PT ICD-10 Diagnosis Description L97.812 Non-pressure chronic ulcer of other part of right lower le I87.331 Chronic venous hypertension (idiopathic) with ulcer and in lower extremity E11.622  Type 2 diabetes mellitus with other skin ulcer Modifier: g with fat layer flammation of ri Quantity: 1 exposed ght Electronic Signature(s) Signed: 09/27/2019 10:32:20 AM By: Baltazar Najjarobson, Michael MD Previous Signature: 09/26/2019 5:44:18 PM Version By: Zandra AbtsLynch, Shatara RN, BSN Entered By: Baltazar Najjarobson, Michael on 09/27/2019 01:02:7207:24:24

## 2019-10-03 ENCOUNTER — Other Ambulatory Visit: Payer: Self-pay

## 2019-10-03 ENCOUNTER — Encounter (HOSPITAL_BASED_OUTPATIENT_CLINIC_OR_DEPARTMENT_OTHER): Payer: PPO | Admitting: Internal Medicine

## 2019-10-03 DIAGNOSIS — E11621 Type 2 diabetes mellitus with foot ulcer: Secondary | ICD-10-CM | POA: Diagnosis not present

## 2019-10-03 NOTE — Progress Notes (Signed)
ALANO, BLASCO ([id number]) Visit Report for 10/03/2019 SuperBill Details Patient Name: Date of Service: TUNG, PUSTEJOVSKY 10/03/2019 Medical Record Number:[id number] Patient Account Number: [id number] Date of Birth/Sex: Treating RN: 1960/11/14 (58 y.o. Judie Petit) Yevonne Pax Primary Care Provider: [name] Other Clinician: Referring Provider: Treating Provider/Extender:Norell Brisbin, [name], [name]: 9 Diagnosis Coding ICD-10 Codes Code Description [phone] Non-pressure chronic ulcer of other part of right lower leg with fat layer exposed I87.331 Chronic venous hypertension (idiopathic) with ulcer and inflammation of right lower extremity E11.622 Type 2 diabetes mellitus with other skin ulcer E11.42 Type 2 diabetes mellitus with diabetic polyneuropathy Facility Procedures CPT4 Code Description Modifier Quantity [id number] (Facility Use Only) [phone] - APPLY MULTLAY COMPRS LWR RT LEG 1 Electronic Signature(s) Signed: 10/03/2019 5:05:11 PM By: Yevonne Pax RN Signed: 10/03/2019 5:34:39 PM By: [name] MD Entered By: Yevonne Pax on 10/03/2019 15:27:07

## 2019-10-03 NOTE — Progress Notes (Signed)
Dennis Wyatt, Dennis Wyatt ([id number]) Visit Report for 10/03/2019 Arrival Information Details Patient Name: Date of Service: Dennis Wyatt, Dennis Wyatt 10/03/2019 2:30 PM Medical Record Number:[id number] Patient Account Number: [id number] Date of Birth/Sex: Treating RN: 25-Jul-1960 (59 y.o. Judie Petit) Yevonne Pax Primary Care Charmeka Free[place]: [name] Other Clinician: Referring Jasslyn Finkel: Treating Webb Weed/Extender:[name], [name], [name]: 9 Visit Information History Since Last Visit Cane All ordered tests and consults were completed: No Patient Arrived: 14:53 Added or deleted any medications: No Arrival Time: Any new allergies or adverse reactions: No Accompanied By: self None Had a fall or experienced change in No Transfer Assistance: activities of daily living that may affect Patient Identification Verified: Yes risk of falls: Secondary Verification Process Completed: Yes Signs or symptoms of abuse/neglect since last No Patient Requires Transmission-Based No visito Precautions: Hospitalized since last visit: No Patient Has Alerts: No Implantable device outside of the clinic excluding No cellular tissue based products placed in the center since last visit: Has Dressing in Place as Prescribed: Yes Has Compression in Place as Prescribed: Yes Pain Present Now: No Electronic Signature(s) Signed: 10/03/2019 5:05:11 PM By: Yevonne Pax RN Entered By: Yevonne Pax on 10/03/2019 14:54:38 -------------------------------------------------------------------------------- Compression Therapy Details Patient Name: Date of Service: Dennis Wyatt, Dennis Wyatt 10/03/2019 2:30 PM Medical Record [id number] Patient Account Number: [id number] Date of Birth/Sex: Treating RN: January 03, 1961 (59 y.o. Melonie [place] Primary Care Memori Sammon: [name] Other Clinician: Referring Kathyrn Warmuth: Treating Pascha Fogal/Extender:[name], [name], [name]: 9 Compression  Therapy Performed for Wound Wound #2 Right,Lateral Lower Leg Assessment: Performed By: Little Ishikawa, RN Compression Type: Three Emergency planning/management officer) Signed: 10/03/2019 5:05:11 PM By: Yevonne Pax RN Entered By: Yevonne Pax on 10/03/2019 15:24:13 -------------------------------------------------------------------------------- Compression Therapy Details Patient Name: Date of Service: Dennis Wyatt, Dennis Wyatt 10/03/2019 2:30 PM Medical Record Number:[id number] Patient Account Number: [id number] Date of Birth/Sex: Treating RN: Oct 04, 1960 (59 y.o. Melonie [place] Primary Care Wendy Mikles: [name] Other Clinician: Referring Zitlali Primm: Treating Timber Marshman/Extender:[name], [name], [name]: 9 Compression Therapy Performed for Wound Wound #1 Right,Anterior Lower Leg Assessment: Performed By: Little Ishikawa, RN Compression Type: Three Emergency planning/management officer) Signed: 10/03/2019 5:05:11 PM By: Yevonne Pax RN Entered By: Yevonne Pax on 10/03/2019 15:24:14 -------------------------------------------------------------------------------- Encounter Discharge Information Details Patient Name: Date of Service: Dennis Wyatt, Dennis Wyatt 10/03/2019 2:30 PM Medical Record [id number] Patient Account Number: [id number] Date of Birth/Sex: Treating RN: 1961/04/23 (59 y.o. Judie Petit) Yevonne Pax Primary Care Chastin Garlitz: [name] Other Clinician: Referring Redell Nazir: Treating Shirlene Andaya/Extender:[name], [name], [name]: 9 Encounter Discharge Information Items Discharge Condition: Stable Ambulatory Status: Cane Discharge Destination: Home Transportation: Private Auto Accompanied By: self Schedule Follow-up Appointment: Yes Clinical Summary of Care: Patient Declined Electronic Signature(s) Signed: 10/03/2019 5:05:11 PM By: Yevonne Pax RN Entered By: Yevonne Pax on 10/03/2019  15:26:54 -------------------------------------------------------------------------------- Lower Extremity Assessment Details Patient Name: Date of Service: Dennis Wyatt, Dennis Wyatt 10/03/2019 2:30 PM Medical Record Number:[id number] Patient Account Number: [id number] Date of Birth/Sex: Treating RN: 1961-06-24 (59 y.o. Melonie [place] Primary Care Annella Prowell: [name] Other Clinician: Referring Ivionna Verley: Treating Bartley Vuolo/Extender:[name], [name], [name]: 9 Edema Assessment Assessed: [Left: No] [Right: No] Edema: [Left: Ye] [Right: s] Calf Left: Right: Point of Measurement: 36 cm From Medial Instep cm 37 cm Ankle Left: Right: Point of Measurement: 9 cm From Medial Instep cm 24.7 cm Electronic Signature(s) Signed: 10/03/2019 5:05:11 PM By: Yevonne Pax RN Entered By: Yevonne Pax on 10/03/2019 14:57:04 -------------------------------------------------------------------------------- Pain Assessment Details Patient Name: Date of  Service: Dennis Wyatt, Dennis Wyatt 10/03/2019 2:30 PM Medical Record [id number] Patient Account Number: [id number] Date of Birth/Sex: Treating RN: [birth year] (59 y.o. Jerilynn Mages) Carlene Coria Primary Care Maanya Hippert: [name] Other Clinician: Referring Dailyn Reith: Treating Hezzie Karim/Extender:[name], [name], [name] Weeks in Treatment: 9 Active Problems Location of Pain Severity and Description of Pain Patient Has Paino No Site Locations Pain Management and Medication Current Pain Management: Electronic Signature(s) Signed: 10/03/2019 5:05:11 PM By: Carlene Coria RN Entered By: Carlene Coria on 10/03/2019 14:55:46 -------------------------------------------------------------------------------- Patient/Caregiver Education Details Patient Name: Date of Service: Dennis Wyatt, Dennis Wyatt 3/22/2021andnbsp2:30 PM Medical Record [id number] Patient Account Number: [id number] Date of Birth/Gender: Treating RN: 05/14/1961 (59  y.o. Jerilynn Mages) Carlene Coria Primary Care Physician: [name] Other Clinician: Referring Physician: Treating Physician/Extender:[name], [name], [name] Weeks in Treatment: 9 Education Assessment Education Provided To: Patient Education Topics Provided Wound/Skin Impairment: Methods: Explain/Verbal Responses: State content correctly Electronic Signature(s) Signed: 10/03/2019 5:05:11 PM By: Carlene Coria RN Entered By: Carlene Coria on 10/03/2019 15:26:37 -------------------------------------------------------------------------------- Wound Assessment Details Patient Name: Date of Service: Dennis Wyatt, Dennis Wyatt 10/03/2019 2:30 PM Medical Record [id number] Patient Account Number: [id number] Date of Birth/Sex: Treating RN: [birth year] (58 y.o. Oval Linsey Primary Care Miyo Aina: [name] Other Clinician: Referring Kaleyah Labreck: Treating Yacob Wilkerson/Extender:[name], [name], [name] Weeks in Treatment: 9 Wound Status Wound Number: 1 Primary Venous Leg Ulcer Etiology: Wound Location: Right Lower Leg - Anterior Secondary Diabetic Wound/Ulcer of the Lower Wounding Event: Trauma Etiology: Extremity Date Acquired: 06/14/2019 Wound Open Weeks Of Treatment: 9 Status: Clustered Wound: No Comorbid Deep Vein Thrombosis, Hypertension, History: Type II Diabetes, Osteoarthritis Wound Measurements Length: (cm) 1.6 % Reduct Width: (cm) 0.9 % Reduct Depth: (cm) 0.1 Epitheli Area: (cm) 1.131 Tunneli Volume: (cm) 0.113 Undermi Wound Description Full Thickness Without Exposed Support Foul Od Classification: Structures Slough/ Wound Flat and Intact Margin: Exudate Small Amount: Exudate Serosanguineous Type: Exudate red, brown Color: Wound Bed Granulation Amount: Large (67-100%) Granulation Quality: Red, Friable Fascia E Necrotic Amount: None Present (0%) Fat Laye Tendon E Muscle E Joint Ex Bone Exp or After Cleansing: No Fibrino  Yes Exposed Structure xposed: No r (Subcutaneous Tissue) Exposed: Yes xposed: No xposed: No posed: No osed: No ion in Area: 86.1% ion in Volume: 93.1% alization: Medium (34-66%) ng: No ning: No Treatment Notes Wound #1 (Right, Anterior Lower Leg) 1. Cleanse With Wound Cleanser Soap and water 3. Primary Dressing Applied Hydrofera Blue 4. Secondary Dressing Dry Gauze 6. Support Layer Applied 3 layer compression wrap Notes classic, moistened with normal saline, netting Electronic Signature(s) Signed: 10/03/2019 5:05:11 PM By: Carlene Coria RN Entered By: Carlene Coria on 10/03/2019 14:59:41 -------------------------------------------------------------------------------- Wound Assessment Details Patient Name: Date of Service: Dennis Wyatt, Dennis Wyatt 10/03/2019 2:30 PM Medical Record [id number] Patient Account Number: [id number] Date of Birth/Sex: Treating RN: [birth year] (58 y.o. Jerilynn Mages) Carlene Coria Primary Care Teliyah Royal: [name] Other Clinician: Referring Kinnley Paulson: Treating Theodor Mustin/Extender:[name], [name], [name] Weeks in Treatment: 9 Wound Status Wound Number: 2 Primary Venous Leg Ulcer Etiology: Wound Location: Right, Lateral Lower Leg Secondary Diabetic Wound/Ulcer of the Lower Wounding Event: Gradually Appeared Etiology: Extremity Date Acquired: 07/11/2019 Wound Open Weeks Of Treatment: 9 Status: Clustered Wound: No Comorbid Deep Vein Thrombosis, Hypertension, History: Type II Diabetes, Osteoarthritis Wound Measurements Length: (cm) 1.2 % Reduct Width: (cm) 0.5 % Reduct Depth: (cm) 0.1 Epitheli Area: (cm) 0.471 Tunneli Volume: (cm) 0.047 Undermi Wound Description Full Thickness Without Exposed Support Foul Od Classification: Structures Slough/ Wound Flat and Intact Margin: Exudate Small Amount: Exudate Serosanguineous Type: Exudate  red, brown Color: Wound Bed Granulation Amount: Large (67-100%) Granulation Quality: Red,  Friable Fascia E Necrotic Amount: None Present (0%) Fat Laye Tendon Ex Muscle Ex Joint Exp Bone Expo or After Cleansing: No Fibrino No Exposed Structure xposed: No r (Subcutaneous Tissue) Exposed: Yes posed: No posed: No osed: No sed: No ion in Area: 85.7% ion in Volume: 95.3% alization: Medium (34-66%) ng: No ning: No Treatment Notes Wound #2 (Right, Lateral Lower Leg) 1. Cleanse With Wound Cleanser Soap and water 3. Primary Dressing Applied Hydrofera Blue 4. Secondary Dressing Dry Gauze 6. Support Layer Applied 3 layer compression wrap Notes classic, moistened with normal saline, netting Electronic Signature(s) Signed: 10/03/2019 5:05:11 PM By: Yevonne Pax RN Entered By: Yevonne Pax on 10/03/2019 14:59:33 -------------------------------------------------------------------------------- Vitals Details Patient Name: Date of Service: Dennis Wyatt, Dennis Wyatt 10/03/2019 2:30 PM Medical Record [id number] Patient Account Number: [id number] Date of Birth/Sex: Treating RN: 06/10/1961 (58 y.o. Judie Petit) Yevonne Pax Primary Care Sheryl Saintil: [name] Other Clinician: Referring Adalie Mand: Treating Jye Fariss/Extender:[name], [name], [name]: 9 Vital Signs Time Taken: 14:54 Temperature (F): 98.2 Height (in): 70 Pulse (bpm): 60 Weight (lbs): 302 Respiratory Rate (breaths/min): 18 Body Mass Index (BMI): 43.3 Blood Pressure (mmHg): 141/60 Reference Range: 80 - 120 mg / dl Electronic Signature(s) Signed: 10/03/2019 5:05:11 PM By: Yevonne Pax RN Entered By: Yevonne Pax on 10/03/2019 14:55:30

## 2019-10-10 ENCOUNTER — Other Ambulatory Visit: Payer: Self-pay

## 2019-10-10 ENCOUNTER — Encounter (HOSPITAL_BASED_OUTPATIENT_CLINIC_OR_DEPARTMENT_OTHER): Payer: PPO | Admitting: Internal Medicine

## 2019-10-10 DIAGNOSIS — L97812 Non-pressure chronic ulcer of other part of right lower leg with fat layer exposed: Secondary | ICD-10-CM | POA: Diagnosis not present

## 2019-10-10 DIAGNOSIS — E1142 Type 2 diabetes mellitus with diabetic polyneuropathy: Secondary | ICD-10-CM | POA: Diagnosis not present

## 2019-10-10 DIAGNOSIS — E11621 Type 2 diabetes mellitus with foot ulcer: Secondary | ICD-10-CM | POA: Diagnosis not present

## 2019-10-10 DIAGNOSIS — I87331 Chronic venous hypertension (idiopathic) with ulcer and inflammation of right lower extremity: Secondary | ICD-10-CM | POA: Diagnosis not present

## 2019-10-10 DIAGNOSIS — E11622 Type 2 diabetes mellitus with other skin ulcer: Secondary | ICD-10-CM | POA: Diagnosis not present

## 2019-10-10 NOTE — Progress Notes (Signed)
Dennis Wyatt, Dennis Wyatt ([id number]) Visit Report for 10/10/2019 HPI Details Patient Name: Date of Service: Dennis Wyatt, Dennis Wyatt 10/10/2019 2:30 PM Medical Record [id number] Patient Account Number: [id number] Date of Birth/Sex: Treating RN: 1961-05-04 (59 y.o. [name] Primary Care Provider: [name] Other Clinician: Referring Provider: Treating Provider/Extender:Ekaterini Capitano, [name], [name] Weeks in Treatment: 10 History of Present Illness HPI Description: ADMISSION 08/01/2019 This is a 59 year old man who is essentially self-referred although he gets his primary care at [facility]. We did not have any information from them. He is here for review of 3 wounds on the right lower leg. He states about a month and a half ago he dropped his cell phone on the right anterior leg creating a wound. More recently he has had 2 blisters come out 1 medially just above the lateral malleolus and one on the posterior calf. All of these are roughly the same condition however. He has been applying some form of ointment were not really sure what this is. He does not have a wound history. 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 ABI in our clinic was 0.95 1/25; the patient has 3 wounds on the right leg. One is on the right anterior 1 just above the lateral malleolus and one on the posterior calf. We have been using Iodoflex under compression. The base of these wounds appears better 2/1; still the 3 wounds one on the right anterior 1 on the lateral and 1 on the posterior calf we have been using Iodoflex under compression making some general improvement in the wound bed. The patient went for his reflux studies on 1/28. This showed reflux in the common femoral vein and greater than 1 second  in the greater saphenous vein at the saphenofemoral junction but nothing in the more superficial veins below the knee either greater or lesser saphenous. I therefore I do not think he is going to be M amenable for an ablation. The report did note an aging consistent deep venous thrombosis involving the right popliteal artery. This led me to research [place] link. He had a similar finding on a an arterial duplex study in 2014 at which time he was recovering from a subarachnoid hemorrhage. This was seen by the rehab team who admitted him to rehab after the surgery for an anterior cerebral aneurysm. By that time it was felt safe to anticoagulate him and he was put on Xarelto and continued that according to the patient to the last year or 2 when it was stopped and he is only on aspirin. 2/8; all of his wounds appear to be making some progress. We have been using Iodoflex. Base of the wounds appear to be cleaning up nicely but still requiring debridement. We have tried to put Apligraf through his insurance related to deep punched out chronic venous insufficiency wounds and a type II diabetic. We will see if this is affordable 2/15; we are using Iodoflex on the wound areas. Everything looks like it is getting better the anterior and posterior wounds on the right contracted however the area on the lateral side is about the same size. We did not hear anything about Apligraf. 2/22; patient has a $601 per application out-of-pocket co-pay for Apligraf. This was beyond his means. He has good edema control 3/1; one of his wounds on the posterior calf is healed. The areas anteriorly  and laterally look healthy and are contracted. We are using Hydrofera Blue under compression 3/8; the patient's wound is on the anterior and lateral right leg. We are using Hydrofera Blue under compression making good progress 3/15; the patient's wound is on the anterior and lateral right leg. We are using Hydrofera Blue on  the compression and there is continued improvement in surface area. The wound that was on the posterior calf is still fully epithelialized 3/29; the patient has improving wounds on the anterior and lateral right leg. We are using Hydrofera Blue under continued compression. Electronic Signature(s) Signed: 10/10/2019 5:31:36 PM By: [name] MD Entered By: [name] on 10/10/2019 15:39:22 -------------------------------------------------------------------------------- Physical Exam Details Patient Name: Date of Service: Dennis Wyatt, Dennis Wyatt 10/10/2019 2:30 PM Medical Record [id number] Patient Account Number: [id number] Date of Birth/Sex: Treating RN: 1960-11-20 (58 y.o. [name] Primary Care Provider: [name] Other Clinician: Referring Provider: Treating Provider/Extender:Clemens Lachman, [name], [name]: 10 Constitutional Patient is hypertensive.. Pulse regular and within target range for patient.Marland Kitchen Respirations regular, non-labored and within target range.. Temperature is normal and within the target range for the patient.Marland Kitchen Appears in no distress. Respiratory work of breathing is normal. Cardiovascular Pedal pulses are palpable on the right. He has edema in the right greater than left leg. Integumentary (Hair, Skin) Skin changes of chronic venous insufficiency. Notes Wound exam; major wound on the lower leg laterally and anteriorly. These are considerably smaller and have a healthy surface. Electronic Signature(s) Signed: 10/10/2019 5:31:36 PM By: [name] MD Entered By: [name] on 10/10/2019 15:40:25 -------------------------------------------------------------------------------- Physician Orders Details Patient Name: Date of Service: Dennis Wyatt, Dennis Wyatt 10/10/2019 2:30 PM Medical Record [id number] Patient Account Number: [id number] Date of Birth/Sex: Treating RN: 31-Oct-1960 (58 y.o. Damaris Schooner Primary Care Provider: Other Clinician: [name] Referring Provider: Treating Provider/Extender:Joshuan Bolander, [name], [name]: 10 Verbal / Phone Orders: No Diagnosis Coding ICD-10 Coding Code Description [phone] Non-pressure chronic ulcer of other part of right lower leg with fat layer exposed I87.331 Chronic venous hypertension (idiopathic) with ulcer and inflammation of right lower extremity E11.622 Type 2 diabetes mellitus with other skin ulcer E11.42 Type 2 diabetes mellitus with diabetic polyneuropathy Follow-up Appointments Return Appointment in 1 week. Dressing Change Frequency Do not change entire dressing for one week. Skin Barriers/Peri-Wound Care Moisturizing lotion TCA Cream or Ointment - mixed with lotion Wound Cleansing May shower with protection. - may use cast protector Primary Wound Dressing Wound #1 Right,Anterior Lower Leg Hydrofera Blue - Classic Wound #2 Right,Lateral Lower Leg Hydrofera Blue - Classic Secondary Dressing Wound #1 Right,Anterior Lower Leg Dry Gauze ABD pad Wound #2 Right,Lateral Lower Leg Dry Gauze ABD pad Edema Control 3 Layer Compression System - Right Lower Extremity - unna layer at top to keep from sliding down Avoid standing for long periods of time Elevate legs to the level of the heart or above for 30 minutes daily and/or when sitting, a frequency of: - throughout the day Exercise regularly Electronic Signature(s) Signed: 10/10/2019 5:31:36 PM By: [name] MD Signed: 10/10/2019 5:31:53 PM By: Zenaida Deed RN, BSN Entered By: Zenaida Deed on 10/10/2019 15:36:26 -------------------------------------------------------------------------------- Problem List Details Patient Name: Date of Service: Dennis Wyatt, Dennis Wyatt 10/10/2019 2:30 PM Medical Record [id number] Patient Account Number: [id number] Date of Birth/Sex: Treating RN: Sep 15, 1960 (58 y.o. Damaris Schooner Primary Care Provider: [name] Other Clinician: Referring Provider: Treating Provider/Extender:Areona Homer, [name], [name]: 10 Active Problems ICD-10 Evaluated  Encounter Code Description Active Date Today Diagnosis L97.812 Non-pressure chronic ulcer of other part of right lower 08/01/2019 No Yes leg with fat layer exposed I87.331 Chronic venous hypertension (idiopathic) with ulcer 08/01/2019 No Yes and inflammation of right lower extremity E11.622 Type 2 diabetes mellitus with other skin ulcer 08/01/2019 No Yes E11.42 Type 2 diabetes mellitus with diabetic polyneuropathy 08/01/2019 No Yes Inactive Problems Resolved Problems Electronic Signature(s) Signed: 10/10/2019 5:31:36 PM By: [name] MD Entered By: [name] on 10/10/2019 15:37:55 -------------------------------------------------------------------------------- Progress Note Details Patient Name: Date of Service: Dennis Wyatt, Dennis Wyatt 10/10/2019 2:30 PM Medical Record [id number] Patient Account Number: [id number] Date of Birth/Sex: Treating RN: 07/19/1960 (58 y.o. [name] Primary Care Provider: [name] Other Clinician: Referring Provider: Treating Provider/Extender:Tynleigh Birt, [name], [name]: 10 Subjective History of Present Illness (HPI) ADMISSION 08/01/2019 This is a 59 year old man who is essentially self-referred although he gets his primary care at [facility]. We did not have any information from them. He is here for review of 3 wounds on the right lower leg. He states about a month and a half ago he dropped his cell phone on the right anterior leg creating a wound. More recently he has had 2 blisters come out 1 medially just above the lateral malleolus and one on the posterior calf. All of these are roughly the same condition however. He has been applying some form of ointment were not really sure  what this is. He does not have a wound history. 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 ABI in our clinic was 0.95 1/25; the patient has 3 wounds on the right leg. One is on the right anterior 1 just above the lateral malleolus and one on the posterior calf. We have been using Iodoflex under compression. The base of these wounds appears better 2/1; still the 3 wounds one on the right anterior 1 on the lateral and 1 on the posterior calf we have been using Iodoflex under compression making some general improvement in the wound bed. The patient went for his reflux studies on 1/28. This showed reflux in the common femoral vein and greater than 1 second in the greater saphenous vein at the saphenofemoral junction but nothing in the more superficial veins below the knee either greater or lesser saphenous. I therefore I do not think he is going to be M amenable for an ablation. The report did note an aging consistent deep venous thrombosis involving the right popliteal artery. This led me to research [place] link. He had a similar finding on a an arterial duplex study in 2014 at which time he was recovering from a subarachnoid hemorrhage. This was seen by the rehab team who admitted him to rehab after the surgery for an anterior cerebral aneurysm. By that time it was felt safe to anticoagulate him and he was put on Xarelto and continued that according to the patient to the last year or 2 when it was stopped and he is only on aspirin. 2/8; all of his wounds appear to be making some progress. We have been using Iodoflex. Base of the wounds appear to be cleaning up nicely but still requiring debridement. We have tried to put Apligraf through his insurance related to deep punched out chronic venous  insufficiency wounds and a type II diabetic. We will see if this  is affordable 2/15; we are using Iodoflex on the wound areas. Everything looks like it is getting better the anterior and posterior wounds on the right contracted however the area on the lateral side is about the same size. We did not hear anything about Apligraf. 2/22; patient has a $200 per application out-of-pocket co-pay for Apligraf. This was beyond his means. He has good edema control 3/1; one of his wounds on the posterior calf is healed. The areas anteriorly and laterally look healthy and are contracted. We are using Hydrofera Blue under compression 3/8; the patient's wound is on the anterior and lateral right leg. We are using Hydrofera Blue under compression making good progress 3/15; the patient's wound is on the anterior and lateral right leg. We are using Hydrofera Blue on the compression and there is continued improvement in surface area. The wound that was on the posterior calf is still fully epithelialized 3/29; the patient has improving wounds on the anterior and lateral right leg. We are using Hydrofera Blue under continued compression. Objective Constitutional Patient is hypertensive.. Pulse regular and within target range for patient.Marland Kitchen. Respirations regular, non-labored and within target range.. Temperature is normal and within the target range for the patient.Marland Kitchen. Appears in no distress. Vitals Time Taken: 2:50 PM, Height: 70 in, Weight: 302 lbs, BMI: 43.3, Temperature: 98.3 F, Pulse: 60 bpm, Respiratory Rate: 18 breaths/min, Blood Pressure: 149/66 mmHg. Respiratory work of breathing is normal. Cardiovascular Pedal pulses are palpable on the right. He has edema in the right greater than left leg. General Notes: Wound exam; major wound on the lower leg laterally and anteriorly. These are considerably smaller and have a healthy surface. Integumentary (Hair, Skin) Skin changes of chronic venous  insufficiency. Wound #1 status is Open. Original cause of wound was Trauma. The wound is located on the Right,Anterior Lower Leg. The wound measures 2.4cm length x 0.9cm width x 0.1cm depth; 1.696cm^2 area and 0.17cm^3 volume. There is Fat Layer (Subcutaneous Tissue) Exposed exposed. There is no tunneling or undermining noted. There is a small amount of serosanguineous drainage noted. The wound margin is flat and intact. There is large (67-100%) red, pink granulation within the wound bed. There is a small (1-33%) amount of necrotic tissue within the wound bed including Adherent Slough. Wound #2 status is Open. Original cause of wound was Gradually Appeared. The wound is located on the Right,Lateral Lower Leg. The wound measures 1.4cm length x 0.5cm width x 0.1cm depth; 0.55cm^2 area and 0.055cm^3 volume. There is Fat Layer (Subcutaneous Tissue) Exposed exposed. There is no tunneling or undermining noted. There is a small amount of serosanguineous drainage noted. The wound margin is flat and intact. There is large (67-100%) red, pink granulation within the wound bed. There is no necrotic tissue within the wound bed. Assessment Active Problems ICD-10 Non-pressure chronic ulcer of other part of right lower leg with fat layer exposed Chronic venous hypertension (idiopathic) with ulcer and inflammation of right lower extremity Type 2 diabetes mellitus with other skin ulcer Type 2 diabetes mellitus with diabetic polyneuropathy Procedures Wound #1 Pre-procedure diagnosis of Wound #1 is a Venous Leg Ulcer located on the Right,Anterior Lower Leg . There was a Three Layer Compression Therapy Procedure by Shawn Stalleaton, Bobbi, RN. Post procedure Diagnosis Wound #1: Same as Pre-Procedure Plan Follow-up Appointments: Return Appointment in 1 week. Dressing Change Frequency: Do not change entire dressing for one week. Skin Barriers/Peri-Wound Care: Moisturizing lotion TCA Cream or Ointment - mixed with  lotion Wound Cleansing: May shower  with protection. - may use cast protector Primary Wound Dressing: Wound #1 Right,Anterior Lower Leg: Hydrofera Blue - Classic Wound #2 Right,Lateral Lower Leg: Hydrofera Blue - Classic Secondary Dressing: Wound #1 Right,Anterior Lower Leg: Dry Gauze ABD pad Wound #2 Right,Lateral Lower Leg: Dry Gauze ABD pad Edema Control: 3 Layer Compression System - Right Lower Extremity - unna layer at top to keep from sliding down Avoid standing for long periods of time Elevate legs to the level of the heart or above for 30 minutes daily and/or when sitting, a frequency of: - throughout the day Exercise regularly 1. Hydrofera Blue ABDs under 3 layer compression Electronic Signature(s) Signed: 10/10/2019 5:31:36 PM By: [name] MD Entered By: [name] on 10/10/2019 15:41:14 -------------------------------------------------------------------------------- SuperBill Details Patient Name: Date of Service: Dennis Wyatt, Dennis Wyatt 10/10/2019 Medical Record Number:[id number] Patient Account Number: [id number] Date of Birth/Sex: Treating RN: 06-15-1961 (58 y.o. Damaris Schooner Primary Care Provider: [name] Other Clinician: Referring Provider: Treating Provider/Extender:Perlita Forbush, [name], [name]: 10 Diagnosis Coding ICD-10 Codes Code Description [phone] Non-pressure chronic ulcer of other part of right lower leg with fat layer exposed I87.331 Chronic venous hypertension (idiopathic) with ulcer and inflammation of right lower extremity E11.622 Type 2 diabetes mellitus with other skin ulcer E11.42 Type 2 diabetes mellitus with diabetic polyneuropathy Facility Procedures CPT4 Code Description: [id number] (Facility Use Only) [phone] - APPLY MULTLAY COMPRS LWR RT LEG Modifier: Quantity: 1 Physician Procedures CPT4 Code Description: [id number] - WC PHYS LEVEL 3 - EST PT ICD-10 Diagnosis Description L97.812  Non-pressure chronic ulcer of other part of right lower le I87.331 Chronic venous hypertension (idiopathic) with ulcer and in lower extremity Modifier: g with fat layer flammation of ri Quantity: 1 exposed ght Electronic Signature(s) Signed: 10/10/2019 5:31:36 PM By: [name] MD Entered By: [name] on 10/10/2019 15:41:33

## 2019-10-14 NOTE — Progress Notes (Signed)
Dennis Wyatt, Dennis Wyatt ([id number]) Visit Report for 10/10/2019 Arrival Information Details Patient Name: Date of Service: Dennis Wyatt, Dennis Wyatt 10/10/2019 2:30 PM Medical Record [id number] Patient Account Number: [id number] Date of Birth/Sex: Treating RN: 1961-04-13 (59 y.o. [name] Primary Care Keaton Stirewalt: [name] Other Clinician: Referring Athen Riel: Treating Hakeem Frazzini/Extender:[name], [name], [name] Weeks in Treatment: 10 Visit Information History Since Last Visit Cane Added or deleted any medications: No Patient Arrived: 14:57 Any new allergies or adverse reactions: No Arrival Time: Had a fall or experienced change in No Accompanied By: self None activities of daily living that may affect Transfer Assistance: risk of falls: Patient Identification Verified: Yes Signs or symptoms of abuse/neglect since last No Secondary Verification Process Completed: Yes visito Patient Requires Transmission-Based No Hospitalized since last visit: No Precautions: Implantable device outside of the clinic excluding No Patient Has Alerts: No cellular tissue based products placed in the center since last visit: Has Dressing in Place as Prescribed: Yes Has Compression in Place as Prescribed: Yes Pain Present Now: No Electronic Signature(s) Signed: 10/10/2019 5:06:29 PM By: [name] Entered By: [name] on 10/10/2019 14:57:33 -------------------------------------------------------------------------------- Compression Therapy Details Patient Name: Date of Service: Dennis Wyatt, Dennis Wyatt 10/10/2019 2:30 PM Medical Record [id number] Patient Account Number: [id number] Date of Birth/Sex: Treating RN: December 26, 1960 (59 y.o. Ernestene Mention Primary Care Kayden Amend: [name] Other Clinician: Referring Sui Kasparek: Treating Madisan Bice/Extender:[name], [name], [name] Weeks in Treatment: 10 Compression Therapy Performed for Wound Wound  #1 Right,Anterior Lower Leg Assessment: Performed By: Clinician [name], RN Compression Type: Three Layer Post Procedure Diagnosis Same as Pre-procedure Electronic Signature(s) Signed: 10/10/2019 5:31:53 PM By: Baruch Gouty RN, BSN Entered By: Baruch Gouty on 10/10/2019 15:33:57 -------------------------------------------------------------------------------- Lower Extremity Assessment Details Patient Name: Date of Service: Dennis Wyatt, Dennis Wyatt 10/10/2019 2:30 PM Medical Record [id number] Patient Account Number: [id number] Date of Birth/Sex: Treating RN: August 20, 1960 (59 y.o. [name] Primary Care Franceska Strahm: [name] Other Clinician: Referring Aspyn Warnke: Treating Lonie Newsham/Extender:[name], [name], [name] Weeks in Treatment: 10 Edema Assessment Assessed: [Left: No] [Right: No] Edema: [Left: Ye] [Right: s] Calf Left: Right: Point of Measurement: 36 cm From Medial Instep cm 46 cm Ankle Left: Right: Point of Measurement: 9 cm From Medial Instep cm 24.6 cm Vascular Assessment Pulses: Dorsalis Pedis Palpable: [Right:Yes] Electronic Signature(s) Signed: 10/10/2019 5:06:29 PM By: [name] Entered By: [name] on 10/10/2019 15:04:53 -------------------------------------------------------------------------------- Multi Wound Chart Details Patient Name: Date of Service: Dennis Wyatt, Dennis Wyatt 10/10/2019 2:30 PM Medical Record [id number] Patient Account Number: [id number] Date of Birth/Sex: Treating RN: [birth year] (59 y.o. [name] Primary Care Meighan Treto: [name] Other Clinician: Referring Dasani Crear: Treating Merica Prell/Extender:[name], [name], [name] Weeks in Treatment: 10 Vital Signs Height(in): 70 Pulse(bpm): 60 Weight(lbs): 302 Blood Pressure(mmHg): 149/66 Body Mass Index(BMI): 43 Temperature(F): 98.3 Respiratory 18 Rate(breaths/min): Photos: [1:No Photos] [2:No Photos]  [N/A:N/A] Wound Location: [1:Right Lower Leg - Anterior Right Lower Leg - Lateral N/A] Wounding Event: [1:Trauma] [2:Gradually Appeared] [N/A:N/A] Primary Etiology: [1:Venous Leg Ulcer] [2:Venous Leg Ulcer] [N/A:N/A] Secondary Etiology: [1:Diabetic Wound/Ulcer of the Diabetic Wound/Ulcer of the N/A Lower Extremity] [2:Lower Extremity] Comorbid History: [1:Deep Vein Thrombosis, Hypertension, Type II Diabetes, Osteoarthritis] [2:Deep Vein Thrombosis, Hypertension, Type II Diabetes, Osteoarthritis] [N/A:N/A] Date Acquired: [1:06/14/2019] [2:07/11/2019] [N/A:N/A] Weeks of Treatment: [1:10] [2:10] [N/A:N/A] Wound Status: [1:Open] [2:Open] [N/A:N/A] Measurements L x W x D 2.4x0.9x0.1 [2:1.4x0.5x0.1] [N/A:N/A] (cm) Area (cm) : [1:1.696] [2:0.55] [N/A:N/A] Volume (cm) : [1:0.17] [2:0.055] [N/A:N/A] % Reduction in Area: [1:79.20%] [2:83.30%] [N/A:N/A] % Reduction in Volume: 89.60% [2:94.40%] [N/A:N/A]  Classification: [1:Full Thickness Without Exposed Support Structures Exposed Support Structures] [2:Full Thickness Without] [N/A:N/A] Exudate Amount: [1:Small] [2:Small] [N/A:N/A] Exudate Type: [1:Serosanguineous] [2:Serosanguineous] [N/A:N/A] Exudate Color: [1:red, brown] [2:red, brown] [N/A:N/A] Wound Margin: [1:Flat and Intact] [2:Flat and Intact] [N/A:N/A] Granulation Amount: [1:Large (67-100%)] [2:Large (67-100%)] [N/A:N/A] Granulation Quality: [1:Red, Pink] [2:Red, Pink] [N/A:N/A] Necrotic Amount: [1:Small (1-33%)] [2:None Present (0%)] [N/A:N/A] Exposed Structures: [1:Fat Layer (Subcutaneous Fat Layer (Subcutaneous N/A Tissue) Exposed: Yes Fascia: No Tendon: No Muscle: No Joint: No Bone: No] [2:Tissue) Exposed: Yes Fascia: No Tendon: No Muscle: No Joint: No Bone: No] Epithelialization: [1:Medium (34-66%)] [2:Medium (34-66%) N/A] [N/A:N/A N/A] Treatment Notes Electronic Signature(s) Signed: 10/10/2019 5:31:36 PM By: [name] MD Signed: 10/14/2019 5:28:43 PM By: Zandra Abts RN,  BSN Entered By: [name] on 10/10/2019 15:38:17 -------------------------------------------------------------------------------- Multi-Disciplinary Care Plan Details Patient Name: Date of Service: Dennis Wyatt, Dennis Wyatt 10/10/2019 2:30 PM Medical Record [id number] Patient Account Number: [id number] Date of Birth/Sex: Treating RN: 05/20/1961 (58 y.o. Damaris Schooner Primary Care Kaiyan Luczak: [name] Other Clinician: Referring Vernee Baines: Treating Amanuel Sinkfield/Extender:[name], [name], [name]: 10 Active Inactive Wound/Skin Impairment Nursing Diagnoses: Impaired tissue integrity Knowledge deficit related to smoking impact on wound healing Knowledge deficit related to ulceration/compromised skin integrity Goals: Patient/caregiver will verbalize understanding of skin care regimen Date Initiated: 08/01/2019 Target Resolution Date: 10/28/2019 Goal Status: Active Interventions: Assess patient/caregiver ability to obtain necessary supplies Assess patient/caregiver ability to perform ulcer/skin care regimen upon admission and as needed Assess ulceration(s) every visit Provide education on ulcer and skin care Notes: Electronic Signature(s) Signed: 10/10/2019 5:31:53 PM By: Zenaida Deed RN, BSN Entered By: Zenaida Deed on 10/10/2019 15:04:57 -------------------------------------------------------------------------------- Pain Assessment Details Patient Name: Date of Service: Dennis Wyatt, Dennis Wyatt 10/10/2019 2:30 PM Medical Record [id number] Patient Account Number: [id number] Date of Birth/Sex: Treating RN: 06/10/1961 (58 y.o. Katherina Right Primary Care Harlyn Rathmann: [name] Other Clinician: Referring Cory Kitt: Treating Jolynda Townley/Extender:[name], [name], [name]: 10 Active Problems Location of Pain Severity and Description of Pain Patient Has Paino No Site Locations Pain Management and  Medication Current Pain Management: Electronic Signature(s) Signed: 10/10/2019 5:06:29 PM By: Cherylin Mylar Entered By: Cherylin Mylar on 10/10/2019 15:04:29 -------------------------------------------------------------------------------- Patient/Caregiver Education Details Patient Name: Date of Service: Dennis Wyatt, Dennis Wyatt 3/29/2021andnbsp2:30 PM Medical Record [id number] Patient Account Number: [id number] Date of Birth/Gender: Treating RN: [birth year] (58 y.o. Damaris Schooner Primary Care Physician: [name] Other Clinician: Referring Physician: Treating Physician/Extender:[name], [name], [name]: 10 Education Assessment Education Provided To: Patient Education Topics Provided Venous: Methods: Explain/Verbal Responses: Reinforcements needed, State content correctly Wound/Skin Impairment: Methods: Explain/Verbal Responses: Reinforcements needed, State content correctly Electronic Signature(s) Signed: 10/10/2019 5:31:53 PM By: Zenaida Deed RN, BSN Entered By: Zenaida Deed on 10/10/2019 15:05:24 -------------------------------------------------------------------------------- Wound Assessment Details Patient Name: Date of Service: Dennis Wyatt, Dennis Wyatt 10/10/2019 2:30 PM Medical Record [id number] Patient Account Number: [id number] Date of Birth/Sex: Treating RN: 1960-09-02 (58 y.o. Katherina Right Primary Care Qadir Folks: [name] Other Clinician: Referring Grahm Etsitty: Treating Shyheem Whitham/Extender:[name], [name], [name]: 10 Wound Status Wound Number: 1 Primary Venous Leg Ulcer Etiology: Wound Location: Right Lower Leg - Anterior Secondary Diabetic Wound/Ulcer of the Lower Wounding Event: Trauma Etiology: Extremity Date Acquired: 06/14/2019 Wound Open Weeks Of Treatment: 10 Status: Clustered Wound: No Comorbid Deep Vein Thrombosis, Hypertension, Type History: II  Diabetes, Osteoarthritis Photos Photo Uploaded By: Benjaman Kindler on 10/14/2019 10:58:36 Wound Measurements Length: (cm) 2.4 % Reduct Width: (cm) 0.9 % Reduct Depth: (cm) 0.1 Epitheli  Area: (cm) 1.696 Tunneli Volume: (cm) 0.17 Undermi Wound Description Full Thickness Without Exposed Support Foul Odo Classification: Structures Slough/F Wound Flat and Intact Margin: Exudate Small Amount: Exudate Serosanguineous Type: Exudate red, brown Color: Wound Bed Granulation Amount: Large (67-100%) Granulation Quality: Red, Pink Fascia E Necrotic Amount: Small (1-33%) Fat Laye Necrotic Quality: Adherent Slough Tendon E Muscle E Joint Ex Bone Exp r After Cleansing: No ibrino Yes Exposed Structure xposed: No r (Subcutaneous Tissue) Exposed: Yes xposed: No xposed: No posed: No osed: No ion in Area: 79.2% ion in Volume: 89.6% alization: Medium (34-66%) ng: No ning: No Electronic Signature(s) Signed: 10/10/2019 5:06:29 PM By: Cherylin Mylar Entered By: Cherylin Mylar on 10/10/2019 15:08:31 -------------------------------------------------------------------------------- Wound Assessment Details Patient Name: Date of Service: Dennis Wyatt, Dennis Wyatt 10/10/2019 2:30 PM Medical Record [id number] Patient Account Number: [id number] Date of Birth/Sex: Treating RN: 03-Dec-1960 (58 y.o. Katherina Right Primary Care Anylah Scheib: [name] Other Clinician: Referring Elita Dame: Treating Silvester Reierson/Extender:[name], [name], [name]: 10 Wound Status Wound Number: 2 Primary Venous Leg Ulcer Etiology: Wound Location: Right Lower Leg - Lateral Secondary Diabetic Wound/Ulcer of the Lower Wounding Event: Gradually Appeared Etiology: Extremity Date Acquired: 07/11/2019 Wound Open Weeks Of Treatment: 10 Status: Clustered Wound: No Comorbid Deep Vein Thrombosis, Hypertension, Type History: II Diabetes, Osteoarthritis Photos Photo Uploaded  By: Benjaman Kindler on 10/14/2019 10:58:37 Wound Measurements Length: (cm) 1.4 % Reduct Width: (cm) 0.5 % Reduct Depth: (cm) 0.1 Epitheli Area: (cm) 0.55 Tunneli Volume: (cm) 0.055 Undermi Wound Description Classification: Full Thickness Without Exposed Support Foul Odo Structures Slough/F Wound Wound Flat and Intact Margin: Exudate Small Amount: Exudate Serosanguineous Type: Exudate red, brown Color: Wound Bed Granulation Amount: Large (67-100%) Granulation Quality: Red, Pink Fascia Necrotic Amount: None Present (0%) Fat La Tendon Muscle Joint Bone E r After Cleansing: No ibrino No Exposed Structure Exposed: No yer (Subcutaneous Tissue) Exposed: Yes Exposed: No Exposed: No Exposed: No xposed: No ion in Area: 83.3% ion in Volume: 94.4% alization: Medium (34-66%) ng: No ning: No Electronic Signature(s) Signed: 10/10/2019 5:06:29 PM By: Cherylin Mylar Entered By: Cherylin Mylar on 10/10/2019 15:08:48 -------------------------------------------------------------------------------- Vitals Details Patient Name: Date of Service: Dennis Wyatt, Dennis Wyatt 10/10/2019 2:30 PM Medical Record [id number] Patient Account Number: [id number] Date of Birth/Sex: Treating RN: [birth year] (58 y.o. Katherina Right Primary Care Tiarah Shisler: [name] Other Clinician: Referring Keithan Dileonardo: Treating Roy Snuffer/Extender:[name], [name], [name]: 10 Vital Signs Time Taken: 14:50 Temperature (F): 98.3 Height (in): 70 Pulse (bpm): 60 Weight (lbs): 302 Respiratory Rate (breaths/min): 18 Body Mass Index (BMI): 43.3 Blood Pressure (mmHg): 149/66 Reference Range: 80 - 120 mg / dl Electronic Signature(s) Signed: 10/10/2019 5:06:29 PM By: Cherylin Mylar Entered By: Cherylin Mylar on 10/10/2019 14:58:54

## 2019-10-17 ENCOUNTER — Encounter (HOSPITAL_BASED_OUTPATIENT_CLINIC_OR_DEPARTMENT_OTHER): Payer: PPO | Attending: Internal Medicine | Admitting: Internal Medicine

## 2019-10-17 ENCOUNTER — Other Ambulatory Visit: Payer: Self-pay

## 2019-10-17 DIAGNOSIS — I10 Essential (primary) hypertension: Secondary | ICD-10-CM | POA: Insufficient documentation

## 2019-10-17 DIAGNOSIS — Z96642 Presence of left artificial hip joint: Secondary | ICD-10-CM | POA: Insufficient documentation

## 2019-10-17 DIAGNOSIS — I872 Venous insufficiency (chronic) (peripheral): Secondary | ICD-10-CM | POA: Diagnosis not present

## 2019-10-17 DIAGNOSIS — E11622 Type 2 diabetes mellitus with other skin ulcer: Secondary | ICD-10-CM | POA: Insufficient documentation

## 2019-10-17 DIAGNOSIS — E1142 Type 2 diabetes mellitus with diabetic polyneuropathy: Secondary | ICD-10-CM | POA: Diagnosis not present

## 2019-10-17 DIAGNOSIS — M1990 Unspecified osteoarthritis, unspecified site: Secondary | ICD-10-CM | POA: Diagnosis not present

## 2019-10-17 DIAGNOSIS — L97812 Non-pressure chronic ulcer of other part of right lower leg with fat layer exposed: Secondary | ICD-10-CM | POA: Insufficient documentation

## 2019-10-17 DIAGNOSIS — Z87891 Personal history of nicotine dependence: Secondary | ICD-10-CM | POA: Diagnosis not present

## 2019-10-17 DIAGNOSIS — Z86718 Personal history of other venous thrombosis and embolism: Secondary | ICD-10-CM | POA: Insufficient documentation

## 2019-10-17 DIAGNOSIS — I87331 Chronic venous hypertension (idiopathic) with ulcer and inflammation of right lower extremity: Secondary | ICD-10-CM | POA: Diagnosis not present

## 2019-10-17 NOTE — Progress Notes (Signed)
Dennis Wyatt, Dennis Wyatt ([id number]) Visit Report for 10/17/2019 HPI Details Patient Name: Date of Service: Dennis Wyatt, Dennis Wyatt 10/17/2019 3:15 PM Medical Record [id number] Patient Account Number: [id number] Date of Birth/Sex: Treating RN: July 01, 1961 (59 y.o. [name] Primary Care Provider: [name] Other Clinician: Referring Provider: Treating Provider/Extender:Abrahan Fulmore, [name], [name] Weeks in Treatment: 11 History of Present Illness HPI Description: ADMISSION 08/01/2019 This is a 59 year old man who is essentially self-referred although he gets his primary care at [facility]. We did not have any information from them. He is here for review of 3 wounds on the right lower leg. He states about a month and a half ago he dropped his cell phone on the right anterior leg creating a wound. More recently he has had 2 blisters come out 1 medially just above the lateral malleolus and one on the posterior calf. All of these are roughly the same condition however. He has been applying some form of ointment were not really sure what this is. He does not have a wound history. 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 ABI in our clinic was 0.95 1/25; the patient has 3 wounds on the right leg. One is on the right anterior 1 just above the lateral malleolus and one on the posterior calf. We have been using Iodoflex under compression. The base of these wounds appears better 2/1; still the 3 wounds one on the right anterior 1 on the lateral and 1 on the posterior calf we have been using Iodoflex under compression making some general improvement in the wound bed. The patient went for his reflux studies on 1/28. This showed reflux in the common femoral vein and greater than 1 second  in the greater saphenous vein at the saphenofemoral junction but nothing in the more superficial veins below the knee either greater or lesser saphenous. I therefore I do not think he is going to be M amenable for an ablation. The report did note an aging consistent deep venous thrombosis involving the right popliteal artery. This led me to research [place] link. He had a similar finding on a an arterial duplex study in 2014 at which time he was recovering from a subarachnoid hemorrhage. This was seen by the rehab team who admitted him to rehab after the surgery for an anterior cerebral aneurysm. By that time it was felt safe to anticoagulate him and he was put on Xarelto and continued that according to the patient to the last year or 2 when it was stopped and he is only on aspirin. 2/8; all of his wounds appear to be making some progress. We have been using Iodoflex. Base of the wounds appear to be cleaning up nicely but still requiring debridement. We have tried to put Apligraf through his insurance related to deep punched out chronic venous insufficiency wounds and a type II diabetic. We will see if this is affordable 2/15; we are using Iodoflex on the wound areas. Everything looks like it is getting better the anterior and posterior wounds on the right contracted however the area on the lateral side is about the same size. We did not hear anything about Apligraf. 2/22; patient has a $213 per application out-of-pocket co-pay for Apligraf. This was beyond his means. He has good edema control 3/1; one of his wounds on the posterior calf is healed. The areas anteriorly  and laterally look healthy and are contracted. We are using Hydrofera Blue under compression 3/8; the patient's wound is on the anterior and lateral right leg. We are using Hydrofera Blue under compression making good progress 3/15; the patient's wound is on the anterior and lateral right leg. We are using Hydrofera Blue on  the compression and there is continued improvement in surface area. The wound that was on the posterior calf is still fully epithelialized 3/29; the patient has improving wounds on the anterior and lateral right leg. We are using Hydrofera Blue under continued compression. 4/5; continued improvements in the wounds on the anterior and lateral right leg we have been using Hydrofera Blue under compression. He has 20/30 below-knee compression stockings from elastic therapy we will discharge him in these. Hopefully he will be compliant Electronic Signature(s) Signed: 10/17/2019 5:24:04 PM By: [name] MD Entered By: [name] on 10/17/2019 17:09:09 -------------------------------------------------------------------------------- Physical Exam Details Patient Name: Date of Service: Dennis Wyatt, Dennis Wyatt 10/17/2019 3:15 PM Medical Record [id number] Patient Account Number: [id number] Date of Birth/Sex: Treating RN: [birth year] (58 y.o. [name] Primary Care Provider: [name] Other Clinician: Referring Provider: Treating Provider/Extender:Aniruddh Ciavarella, [name], [name]: 11 Constitutional Patient is hypertensive.. Pulse regular and within target range for patient.Marland Kitchen Respirations regular, non-labored and within target range.. Temperature is normal and within the target range for the patient.Marland Kitchen Appears in no distress. Respiratory work of breathing is normal. Cardiovascular Pedal pulses are palpable. Changes of chronic venous insufficiency. Psychiatric appears at normal baseline. Notes Wound exam; major wound on the lower leg laterally and anteriorly. Both of these are considerably smaller and look healthy. Electronic Signature(s) Signed: 10/17/2019 5:24:04 PM By: [name] MD Entered By: [name] on 10/17/2019 17:10:46 -------------------------------------------------------------------------------- Physician Orders  Details Patient Name: Date of Service: Dennis Wyatt, Dennis Wyatt 10/17/2019 3:15 PM Medical Record [id number] Patient Account Number: [id number] Date of Birth/Sex: Treating RN: Dec 15, 1960 (58 y.o. [name] Primary Care Provider: [name] Other Clinician: Referring Provider: Treating Provider/Extender:Casen Pryor, [name], [name]: 11 Verbal / Phone Orders: No Diagnosis Coding ICD-10 Coding Code Description [phone] Non-pressure chronic ulcer of other part of right lower leg with fat layer exposed I87.331 Chronic venous hypertension (idiopathic) with ulcer and inflammation of right lower extremity E11.622 Type 2 diabetes mellitus with other skin ulcer E11.42 Type 2 diabetes mellitus with diabetic polyneuropathy Follow-up Appointments Return Appointment in 1 week. Dressing Change Frequency Do not change entire dressing for one week. Skin Barriers/Peri-Wound Care Moisturizing lotion TCA Cream or Ointment - mixed with lotion Wound Cleansing May shower with protection. - may use cast protector Primary Wound Dressing Wound #1 Right,Anterior Lower Leg Hydrofera Blue - Classic Wound #2 Right,Lateral Lower Leg Hydrofera Blue - Classic Secondary Dressing Wound #1 Right,Anterior Lower Leg Dry Gauze ABD pad Wound #2 Right,Lateral Lower Leg Dry Gauze ABD pad Edema Control 3 Layer Compression System - Right Lower Extremity - unna layer at top to keep from sliding down Avoid standing for long periods of time Elevate legs to the level of the heart or above for 30 minutes daily and/or when sitting, a frequency of: - throughout the day Exercise regularly Electronic Signature(s) Signed: 10/17/2019 5:09:30 PM By: Zandra Abts RN, BSN Signed: 10/17/2019 5:24:04 PM By: [name] MD Entered By: Zandra Abts on 10/17/2019 16:17:32 -------------------------------------------------------------------------------- Problem List Details Patient  Name: Date of Service: Dennis Wyatt, Dennis Wyatt 10/17/2019 3:15 PM Medical Record [id number] Patient Account Number: [id number] Date of Birth/Sex: Treating RN: [birth year] (  59 y.o. [name] Primary Care Provider: [name] Other Clinician: Referring Provider: Treating Provider/Extender:Glennys Schorsch, [name], [name]: 11 Active Problems ICD-10 Evaluated Encounter Code Description Active Date Today Diagnosis L97.812 Non-pressure chronic ulcer of other part of right lower 08/01/2019 No Yes leg with fat layer exposed I87.331 Chronic venous hypertension (idiopathic) with ulcer 08/01/2019 No Yes and inflammation of right lower extremity E11.622 Type 2 diabetes mellitus with other skin ulcer 08/01/2019 No Yes E11.42 Type 2 diabetes mellitus with diabetic polyneuropathy 08/01/2019 No Yes Inactive Problems Resolved Problems Electronic Signature(s) Signed: 10/17/2019 5:24:04 PM By: [name] MD Entered By: [name] on 10/17/2019 17:08:02 -------------------------------------------------------------------------------- Progress Note Details Patient Name: Date of Service: Dennis Wyatt, Dennis Wyatt 10/17/2019 3:15 PM Medical Record [id number] Patient Account Number: [id number] Date of Birth/Sex: Treating RN: April 05, 1961 (58 y.o. [name] Primary Care Provider: [name] Other Clinician: Referring Provider: Treating Provider/Extender:Tanara Turvey, [name], [name]: 11 Subjective History of Present Illness (HPI) ADMISSION 08/01/2019 This is a 59 year old man who is essentially self-referred although he gets his primary care at [facility]. We did not have any information from them. He is here for review of 3 wounds on the right lower leg. He states about a month and a half ago he dropped his cell phone on the right anterior leg creating a wound. More recently he has had 2 blisters come out 1  medially just above the lateral malleolus and one on the posterior calf. All of these are roughly the same condition however. He has been applying some form of ointment were not really sure what this is. He does not have a wound history. 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 ABI in our clinic was 0.95 1/25; the patient has 3 wounds on the right leg. One is on the right anterior 1 just above the lateral malleolus and one on the posterior calf. We have been using Iodoflex under compression. The base of these wounds appears better 2/1; still the 3 wounds one on the right anterior 1 on the lateral and 1 on the posterior calf we have been using Iodoflex under compression making some general improvement in the wound bed. The patient went for his reflux studies on 1/28. This showed reflux in the common femoral vein and greater than 1 second in the greater saphenous vein at the saphenofemoral junction but nothing in the more superficial veins below the knee either greater or lesser saphenous. I therefore I do not think he is going to be M amenable for an ablation. The report did note an aging consistent deep venous thrombosis involving the right popliteal artery. This led me to research [place] link. He had a similar finding on a an arterial duplex study in 2014 at which time he was recovering from a subarachnoid hemorrhage. This was seen by the rehab team who admitted him to rehab after the surgery for an anterior cerebral aneurysm. By that time it was felt safe to anticoagulate him and he was put on Xarelto and continued that according to the patient to the last year or 2 when it was stopped and he is only on aspirin. 2/8; all of his wounds appear to be making some progress. We have been using Iodoflex.  Base of the wounds appear to be cleaning up nicely but still requiring  debridement. We have tried to put Apligraf through his insurance related to deep punched out chronic venous insufficiency wounds and a type II diabetic. We will see if this is affordable 2/15; we are using Iodoflex on the wound areas. Everything looks like it is getting better the anterior and posterior wounds on the right contracted however the area on the lateral side is about the same size. We did not hear anything about Apligraf. 2/22; patient has a $200 per application out-of-pocket co-pay for Apligraf. This was beyond his means. He has good edema control 3/1; one of his wounds on the posterior calf is healed. The areas anteriorly and laterally look healthy and are contracted. We are using Hydrofera Blue under compression 3/8; the patient's wound is on the anterior and lateral right leg. We are using Hydrofera Blue under compression making good progress 3/15; the patient's wound is on the anterior and lateral right leg. We are using Hydrofera Blue on the compression and there is continued improvement in surface area. The wound that was on the posterior calf is still fully epithelialized 3/29; the patient has improving wounds on the anterior and lateral right leg. We are using Hydrofera Blue under continued compression. 4/5; continued improvements in the wounds on the anterior and lateral right leg we have been using Hydrofera Blue under compression. He has 20/30 below-knee compression stockings from elastic therapy we will discharge him in these. Hopefully he will be compliant Objective Constitutional Patient is hypertensive.. Pulse regular and within target range for patient.Marland Kitchen Respirations regular, non-labored and within target range.. Temperature is normal and within the target range for the patient.Marland Kitchen Appears in no distress. Vitals Time Taken: 3:43 PM, Height: 70 in, Weight: 302 lbs, BMI: 43.3, Temperature: 98.4  F, Pulse: 77 bpm, Respiratory Rate: 18 breaths/min, Blood Pressure: 143/63 mmHg. Respiratory work of breathing is normal. Cardiovascular Pedal pulses are palpable. Changes of chronic venous insufficiency. Psychiatric appears at normal baseline. General Notes: Wound exam; major wound on the lower leg laterally and anteriorly. Both of these are considerably smaller and look healthy. Integumentary (Hair, Skin) Wound #1 status is Open. Original cause of wound was Trauma. The wound is located on the Right,Anterior Lower Leg. The wound measures 0.8cm length x 0.4cm width x 0.1cm depth; 0.251cm^2 area and 0.025cm^3 volume. There is Fat Layer (Subcutaneous Tissue) Exposed exposed. There is no tunneling or undermining noted. There is a small amount of serosanguineous drainage noted. The wound margin is flat and intact. There is large (67-100%) red, pink granulation within the wound bed. There is a small (1-33%) amount of necrotic tissue within the wound bed including Adherent Slough. Wound #2 status is Open. Original cause of wound was Gradually Appeared. The wound is located on the Right,Lateral Lower Leg. The wound measures 0.9cm length x 0.5cm width x 0.1cm depth; 0.353cm^2 area and 0.035cm^3 volume. There is Fat Layer (Subcutaneous Tissue) Exposed exposed. There is no tunneling or undermining noted. There is a small amount of serosanguineous drainage noted. The wound margin is flat and intact. There is large (67-100%) red, pink granulation within the wound bed. There is no necrotic tissue within the wound bed. Assessment Active Problems ICD-10 Non-pressure chronic ulcer of other part of right lower leg with fat layer exposed Chronic venous hypertension (idiopathic) with ulcer and inflammation of right lower extremity Type 2 diabetes mellitus with other skin ulcer Type 2 diabetes mellitus with diabetic polyneuropathy Procedures Wound #1 Pre-procedure diagnosis of Wound #1 is a Venous Leg  Ulcer located on  the Right,Anterior Lower Leg . There was a Three Layer Compression Therapy Procedure by Zandra AbtsLynch, Shatara, RN. Post procedure Diagnosis Wound #1: Same as Pre-Procedure Wound #2 Pre-procedure diagnosis of Wound #2 is a Venous Leg Ulcer located on the Right,Lateral Lower Leg . There was a Three Layer Compression Therapy Procedure by Zandra AbtsLynch, Shatara, RN. Post procedure Diagnosis Wound #2: Same as Pre-Procedure Plan Follow-up Appointments: Return Appointment in 1 week. Dressing Change Frequency: Do not change entire dressing for one week. Skin Barriers/Peri-Wound Care: Moisturizing lotion TCA Cream or Ointment - mixed with lotion Wound Cleansing: May shower with protection. - may use cast protector Primary Wound Dressing: Wound #1 Right,Anterior Lower Leg: Hydrofera Blue - Classic Wound #2 Right,Lateral Lower Leg: Hydrofera Blue - Classic Secondary Dressing: Wound #1 Right,Anterior Lower Leg: Dry Gauze ABD pad Wound #2 Right,Lateral Lower Leg: Dry Gauze ABD pad Edema Control: 3 Layer Compression System - Right Lower Extremity - unna layer at top to keep from sliding down Avoid standing for long periods of time Elevate legs to the level of the heart or above for 30 minutes daily and/or when sitting, a frequency of: - throughout the day Exercise regularly #1 continue with Hydrofera Blue ABDs under 3 layer compression 2. He has 20/30 below-knee stockings. If he is closed next time of discharge him in these Electronic Signature(s) Signed: 10/17/2019 5:24:04 PM By: Baltazar Najjarobson, Agron Swiney MD Entered By: Baltazar Najjarobson, Girtrude Enslin on 10/17/2019 17:12:10 -------------------------------------------------------------------------------- SuperBill Details Patient Name: Date of Service: Jeannette HowHOMAS, Dennis Wyatt 10/17/2019 Medical Record Number:[id number] Patient Account Number: [id number] Date of Birth/Sex: Treating RN: 25-May-1961 (58 y.o. Elizebeth KollerM) Lynch, Shatara Primary Care Provider: [name], Wilson  Oliver Other Clinician: Referring Provider: Treating Provider/Extender:Mabry Santarelli, Maeola SarahMichael Elkins, Curly RimWilson Oliver Weeks in Treatment: 11 Diagnosis Coding ICD-10 Codes Code Description [phone] Non-pressure chronic ulcer of other part of right lower leg with fat layer exposed I87.331 Chronic venous hypertension (idiopathic) with ulcer and inflammation of right lower extremity E11.622 Type 2 diabetes mellitus with other skin ulcer E11.42 Type 2 diabetes mellitus with diabetic polyneuropathy Facility Procedures CPT4 Code Description: [id number] (Facility Use Only) [id number] - APPLY MULTLAY COMPRS LWR RT LEG Modifier: Quantity: 1 Physician Procedures CPT4 Code Description: [id number] - WC PHYS LEVEL 3 - EST PT ICD-10 Diagnosis Description L97.812 Non-pressure chronic ulcer of other part of right lower le I87.331 Chronic venous hypertension (idiopathic) with ulcer and in lower extremity Modifier: g with fat layer flammation of ri Quantity: 1 exposed ght Electronic Signature(s) Signed: 10/17/2019 5:24:04 PM By: Baltazar Najjarobson, Indya Oliveria MD Previous Signature: 10/17/2019 5:09:30 PM Version By: Zandra AbtsLynch, Shatara RN, BSN Entered By: Baltazar Najjarobson, Larisa Lanius on 10/17/2019 17:12:39

## 2019-10-19 NOTE — Progress Notes (Signed)
[name], Wyatt ([id number]) Visit Report for 10/17/2019 Arrival Information Details Patient Name: Date of Service: Dennis Wyatt, Dennis Wyatt 10/17/2019 3:15 PM Medical Record Number:[id number] Patient Account Number: [id number] Date of Birth/Sex: Treating RN: 01-Apr-1961 (59 y.o. Dennis Wyatt) Yevonne Pax Primary Care Illa Enlow: [name] Other Clinician: Referring Ryott Rafferty: Treating Kimya Mccahill/Extender:[name], [name], [name]: 11 Visit Information History Since Last Visit All ordered tests and consults were completed: No Patient Arrived: Ambulatory Added or deleted any medications: No Arrival Time: 15:42 Any new allergies or adverse reactions: No Accompanied By: self Had a fall or experienced change in No Transfer Assistance: None activities of daily living that may affect Patient Identification Verified: Yes risk of falls: Secondary Verification Process Completed: Yes Signs or symptoms of abuse/neglect since last No Patient Requires Transmission-Based No visito Precautions: Hospitalized since last visit: No Patient Has Alerts: No Implantable device outside of the clinic excluding No cellular tissue based products placed in the center since last visit: Has Dressing in Place as Prescribed: Yes Has Compression in Place as Prescribed: Yes Pain Present Now: No Electronic Signature(s) Signed: 10/18/2019 6:43:44 PM By: Yevonne Pax RN Entered By: Yevonne Pax on 10/17/2019 15:43:11 -------------------------------------------------------------------------------- Compression Therapy Details Patient Name: Date of Service: JRU, PENSE 10/17/2019 3:15 PM Medical Record [id number] Patient Account Number: [id number] Date of Birth/Sex: Treating RN: 1961/01/02 (59 y.o. Dennis Wyatt Primary Care Zainah Steven: [name] Other Clinician: Referring Larsen Zettel: Treating Meztli Llanas/Extender:[name], [name], [name]:  11 Compression Therapy Performed for Wound Wound #1 Right,Anterior Lower Leg Assessment: Performed By: Clinician Zandra Abts, RN Compression Type: Three Layer Post Procedure Diagnosis Same as Pre-procedure Electronic Signature(s) Signed: 10/17/2019 5:09:30 PM By: Zandra Abts RN, BSN Entered By: Zandra Abts on 10/17/2019 16:18:27 -------------------------------------------------------------------------------- Compression Therapy Details Patient Name: Date of Service: KALETH, KOY 10/17/2019 3:15 PM Medical Record [id number] Patient Account Number: [id number] Date of Birth/Sex: Treating RN: 25-Apr-1961 (59 y.o. Dennis Wyatt Primary Care Nataya Bastedo: [name] Other Clinician: Referring Arad Burston: Treating Dardan Shelton/Extender:[name], [name], [name]: 11 Compression Therapy Performed for Wound Wound #2 Right,Lateral Lower Leg Assessment: Performed By: Clinician Zandra Abts, RN Compression Type: Three Layer Post Procedure Diagnosis Same as Pre-procedure Electronic Signature(s) Signed: 10/17/2019 5:09:30 PM By: Zandra Abts RN, BSN Entered By: Zandra Abts on 10/17/2019 16:18:27 -------------------------------------------------------------------------------- Encounter Discharge Information Details Patient Name: Date of Service: Wyatt, BAUERNFEIND 10/17/2019 3:15 PM Medical Record [id number] Patient Account Number: [id number] Date of Birth/Sex: Treating RN: 1961-04-10 (59 y.o. Dennis Wyatt, [name] Primary Care Symantha Steeber: [name] Other Clinician: Referring Allyana Vogan: Treating Rondey Fallen/Extender:[name], [name], [name]: 11 Encounter Discharge Information Items Discharge Condition: Stable Ambulatory Status: Cane Discharge Destination: Home Transportation: Private Auto Accompanied By: self Schedule Follow-up Appointment: Yes Clinical Summary of Care: Electronic  Signature(s) Signed: 10/17/2019 5:03:02 PM By: Shawn Stall Entered By: Shawn Stall on 10/17/2019 16:34:22 -------------------------------------------------------------------------------- Lower Extremity Assessment Details Patient Name: Date of Service: [name], [name] 10/17/2019 3:15 PM Medical Record [id number] Patient Account Number: [id number] Date of Birth/Sex: Treating RN: Dec 27, 1960 (59 y.o. Dennis Wyatt Primary Care Mariafernanda Hendricksen: [name] Other Clinician: Referring Amaira Safley: Treating Shantal Roan/Extender:[name], [name], [name]: 11 Edema Assessment Assessed: [Left: No] [Right: No] Edema: [Left: Ye] [Right: s] Calf Left: Right: Point of Measurement: 36 cm From Medial Instep cm 38 cm Ankle Left: Right: Point of Measurement: 9 cm From Medial Instep cm 24 cm Electronic Signature(s) Signed: 10/18/2019 6:43:44 PM By: Yevonne Pax RN Entered By: Yevonne Pax  on 10/17/2019 15:44:44 -------------------------------------------------------------------------------- Multi Wound Chart Details Patient Name: Date of Service: Dennis, Wyatt 10/17/2019 3:15 PM Medical Record Number:[id number] Patient Account Number: [id number] Date of Birth/Sex: Treating RN: 11/21/1960 (59 y.o. Dennis Wyatt Primary Care Dennis Wyatt: [name] Other Clinician: Referring Iran Kievit: Treating Haydan Wedig/Extender:[name], [name], [name]: 11 Vital Signs Height(in): 70 Pulse(bpm): 77 Weight(lbs): 302 Blood Pressure(mmHg): 143/63 Body Mass Index(BMI): 43 Temperature(F): 98.4 Respiratory 18 Rate(breaths/min): Photos: [1:No Photos] [2:No Photos] [N/A:N/A] Wound Location: [1:Right, Anterior Lower Leg Right, Lateral Lower Leg] [N/A:N/A] Wounding Event: [1:Trauma] [2:Gradually Appeared] [N/A:N/A] Primary Etiology: [1:Venous Leg Ulcer] [2:Venous Leg Ulcer] [N/A:N/A] Secondary Etiology: [1:Diabetic Wound/Ulcer of the  Diabetic Wound/Ulcer of the N/A Lower Extremity] [2:Lower Extremity] Comorbid History: [1:Deep Vein Thrombosis, Hypertension, Type II Diabetes, Osteoarthritis] [2:Deep Vein Thrombosis, Hypertension, Type II Diabetes, Osteoarthritis] [N/A:N/A] Date Acquired: [1:06/14/2019] [2:07/11/2019] [N/A:N/A] Weeks of Treatment: [1:11] [2:11] [N/A:N/A] Wound Status: [1:Open] [2:Open] [N/A:N/A] Measurements L x W x D 0.8x0.4x0.1 [2:0.9x0.5x0.1] [N/A:N/A] (cm) Area (cm) : [1:0.251] [2:0.353] [N/A:N/A] Volume (cm) : [1:0.025] [2:0.035] [N/A:N/A] % Reduction in Area: [1:96.90%] [2:89.30%] [N/A:N/A] % Reduction in Volume: 98.50% [2:96.50%] [N/A:N/A] Classification: [1:Full Thickness Without Exposed Support Structures Exposed Support Structures] [2:Full Thickness Without] [N/A:N/A] Exudate Amount: [1:Small] [2:Small] [N/A:N/A] Exudate Type: [1:Serosanguineous] [2:Serosanguineous] [N/A:N/A] Exudate Color: [1:red, brown] [2:red, brown] [N/A:N/A] Wound Margin: [1:Flat and Intact] [2:Flat and Intact] [N/A:N/A] Granulation Amount: [1:Large (67-100%)] [2:Large (67-100%)] [N/A:N/A] Granulation Quality: [1:Red, Pink] [2:Red, Pink] [N/A:N/A] Necrotic Amount: [1:Small (1-33%)] [2:None Present (0%)] [N/A:N/A] Exposed Structures: [1:Fat Layer (Subcutaneous Fat Layer (Subcutaneous N/A Tissue) Exposed: Yes Fascia: No Tendon: No Muscle: No Joint: No Bone: No] [2:Tissue) Exposed: Yes Fascia: No Tendon: No Muscle: No Joint: No Bone: No] Epithelialization: [1:Medium (34-66%)] [2:Medium (34-66%) Compression Therapy] [N/A:N/A N/A] Treatment Notes Wound #1 (Right, Anterior Lower Leg) 1. Cleanse With Wound Cleanser Soap and water 2. Periwound Care Moisturizing lotion TCA Cream 3. Primary Dressing Applied Hydrofera Blue 4. Secondary Dressing Dry Gauze 6. Support Layer Applied 3 layer compression wrap Notes netting. Wound #2 (Right, Lateral Lower Leg) 1. Cleanse With Wound Cleanser Soap and water 2. Periwound  Care Moisturizing lotion TCA Cream 3. Primary Dressing Applied Hydrofera Blue 4. Secondary Dressing Dry Gauze 6. Support Layer Applied 3 layer compression wrap Notes netting. Electronic Signature(s) Signed: 10/17/2019 5:09:30 PM By: Zandra Abts RN, BSN Signed: 10/17/2019 5:24:04 PM By: [name] MD Entered By: [name] on 10/17/2019 17:08:14 -------------------------------------------------------------------------------- Multi-Disciplinary Care Plan Details Patient Name: Date of Service: MARTIE, FULGHAM 10/17/2019 3:15 PM Medical Record [id number] Patient Account Number: [id number] Date of Birth/Sex: Treating RN: [birth year] (58 y.o. Dennis Wyatt Primary Care Arali Somera: [name] Other Clinician: Referring Nicklaus Alviar: Treating Elif Yonts/Extender:[name], [name], [name]: 11 Active Inactive Wound/Skin Impairment Nursing Diagnoses: Impaired tissue integrity Knowledge deficit related to smoking impact on wound healing Knowledge deficit related to ulceration/compromised skin integrity Goals: Patient/caregiver will verbalize understanding of skin care regimen Date Initiated: 08/01/2019 Target Resolution Date: 10/28/2019 Goal Status: Active Interventions: Assess patient/caregiver ability to obtain necessary supplies Assess patient/caregiver ability to perform ulcer/skin care regimen upon admission and as needed Assess ulceration(s) every visit Provide education on ulcer and skin care Notes: Electronic Signature(s) Signed: 10/17/2019 5:09:30 PM By: Zandra Abts RN, BSN Entered By: Zandra Abts on 10/17/2019 17:03:43 -------------------------------------------------------------------------------- Pain Assessment Details Patient Name: Date of Service: CARRELL, RAHMANI 10/17/2019 3:15 PM Medical Record [id number] Patient Account Number: [id number] Date of Birth/Sex: Treating RN: 05/01/1961 (58 y.o. Dennis Wyatt Primary Care Stehanie Ekstrom: Windle Guard  Danne Baxter Other Clinician: Referring Britny Riel: Treating Merridy Pascoe/Extender:[name], [name], [name] Weeks in Treatment: 11 Active Problems Location of Pain Severity and Description of Pain Patient Has Paino No Site Locations Pain Management and Medication Current Pain Management: Electronic Signature(s) Signed: 10/18/2019 6:43:44 PM By: Carlene Coria RN Entered By: Carlene Coria on 10/17/2019 15:44:02 -------------------------------------------------------------------------------- Patient/Caregiver Education Details Patient Name: Date of Service: TAEDYN, GLASSCOCK 4/5/2021andnbsp3:15 PM Medical Record [id number] Patient Account Number: [id number] Date of Birth/Gender: Treating RN: 1961-04-01 (59 y.o. [name] Primary Care Physician: [name] Other Clinician: Referring Physician: Treating Physician/Extender:[name], [name], [name] Weeks in Treatment: 11 Education Assessment Education Provided To: Patient Education Topics Provided Wound/Skin Impairment: Methods: Explain/Verbal Responses: State content correctly Electronic Signature(s) Signed: 10/17/2019 5:09:30 PM By: [name] RN, BSN Entered By: [name] on 10/17/2019 17:03:52 -------------------------------------------------------------------------------- Wound Assessment Details Patient Name: Date of Service: BRANDEN, SHALLENBERGER 10/17/2019 3:15 PM Medical Record [id number] Patient Account Number: [id number] Date of Birth/Sex: Treating RN: [birth year] (58 y.o. Oval Linsey Primary Care Doye Montilla: [name] Other Clinician: Referring Adonai Selsor: Treating Caroljean Monsivais/Extender:[name], [name], [name] Weeks in Treatment: 11 Wound Status Wound Number: 1 Primary Venous Leg Ulcer Etiology: Wound Location: Right, Anterior Lower Leg Secondary Diabetic Wound/Ulcer of the Lower Wounding Event:  Trauma Etiology: Extremity Date Acquired: 06/14/2019 Wound Open Weeks Of Treatment: 11 Status: Clustered Wound: No Comorbid Deep Vein Thrombosis, Hypertension, Type History: II Diabetes, Osteoarthritis Wound Measurements Length: (cm) 0.8 % Redu Width: (cm) 0.4 % Redu Depth: (cm) 0.1 Epithe Area: (cm) 0.251 Tunne Volume: (cm) 0.025 Under Wound Description Classification: Full Thickness Without Exposed Support Foul Structures Classification: Structures Slough/ Wound Flat and Intact Margin: Exudate Small Amount: Exudate Serosanguineous Type: Exudate red, brown Color: Wound Bed Granulation Amount: Large (67-100%) Granulation Quality: Red, Pink Fascia E Necrotic Amount: Small (1-33%) Fat Laye Necrotic Quality: Adherent Slough Tendon E Muscle E Joint Ex Bone Exp Odor After Cleansing: No Fibrino Yes Exposed Structure xposed: No r (Subcutaneous Tissue) Exposed: Yes xposed: No xposed: No posed: No osed: No ction in Area: 96.9% ction in Volume: 98.5% lialization: Medium (34-66%) ling: No mining: No Treatment Notes Wound #1 (Right, Anterior Lower Leg) 1. Cleanse With Wound Cleanser Soap and water 2. Periwound Care Moisturizing lotion TCA Cream 3. Primary Dressing Applied Hydrofera Blue 4. Secondary Dressing Dry Gauze 6. Support Layer Applied 3 layer compression wrap Notes netting. Electronic Signature(s) Signed: 10/18/2019 6:43:44 PM By: Carlene Coria RN Entered By: Carlene Coria on 10/17/2019 15:46:40 -------------------------------------------------------------------------------- Wound Assessment Details Patient Name: Date of Service: SELIM, DURDEN 10/17/2019 3:15 PM Medical Record [id number] Patient Account Number: [id number] Date of Birth/Sex: Treating RN: 1960-12-20 (58 y.o. Jerilynn Mages) Carlene Coria Primary Care Navjot Pilgrim: [name] Other Clinician: Referring Jeremih Dearmas: Treating Javonta Gronau/Extender:[name], [name], [name] Weeks in Treatment: 11 Wound Status Wound Number: 2 Primary Venous Leg Ulcer Etiology: Etiology: Wound Location: Right, Lateral Lower Leg Secondary Diabetic Wound/Ulcer of the Lower Wounding Event: Gradually Appeared Etiology: Extremity Date Acquired: 07/11/2019 Wound Open Weeks Of Treatment: 11 Status: Clustered Wound: No Comorbid Deep Vein Thrombosis, Hypertension, Type History: II Diabetes, Osteoarthritis Wound Measurements Length: (cm) 0.9 % Reduct Width: (cm) 0.5 % Reduct Depth: (cm) 0.1 Epitheli Area: (cm) 0.353 Tunneli Volume: (cm) 0.035 Undermi Wound Description Classification: Full Thickness Without Exposed Support Foul Odo Structures Slough/F Wound Flat and Intact Margin: Exudate Small Amount: Exudate Serosanguineous Type: Exudate red, brown Color: Wound Bed Granulation Amount: Large (67-100%) Granulation Quality: Red, Pink Fascia E Necrotic Amount: None Present (0%) Fat Laye  Tendon E Muscle E Joint Ex Bone Exp r After Cleansing: No ibrino No Exposed Structure xposed: No r (Subcutaneous Tissue) Exposed: Yes xposed: No xposed: No posed: No osed: No ion in Area: 89.3% ion in Volume: 96.5% alization: Medium (34-66%) ng: No ning: No Treatment Notes Wound #2 (Right, Lateral Lower Leg) 1. Cleanse With Wound Cleanser Soap and water 2. Periwound Care Moisturizing lotion TCA Cream 3. Primary Dressing Applied Hydrofera Blue 4. Secondary Dressing Dry Gauze 6. Support Layer Applied 3 layer compression wrap Notes netting. Electronic Signature(s) Signed: 10/18/2019 6:43:44 PM By: Yevonne Pax RN Entered By: Yevonne Pax on 10/17/2019 15:47:07 -------------------------------------------------------------------------------- Vitals Details Patient Name: Date of Service: CAIRO, AGOSTINELLI 10/17/2019 3:15 PM Medical Record [id number] Patient Account Number: [id number] Date of Birth/Sex: Treating RN: 17-Feb-1961 (59 y.o. Dennis Wyatt) Yevonne Pax Primary Care Perla Echavarria: [name] Other Clinician: Referring Conor Filsaime: Treating Kenslee Achorn/Extender:[name], [name], [name]: 11 Vital Signs Time Taken: 15:43 Temperature (F): 98.4 Height (in): 70 Pulse (bpm): 77 Weight (lbs): 302 Respiratory Rate (breaths/min): 18 Body Mass Index (BMI): 43.3 Blood Pressure (mmHg): 143/63 Reference Range: 80 - 120 mg / dl Electronic Signature(s) Signed: 10/18/2019 6:43:44 PM By: Yevonne Pax RN Entered By: Yevonne Pax on 10/17/2019 15:43:50

## 2019-10-24 ENCOUNTER — Other Ambulatory Visit: Payer: Self-pay

## 2019-10-24 ENCOUNTER — Encounter (HOSPITAL_BASED_OUTPATIENT_CLINIC_OR_DEPARTMENT_OTHER): Payer: PPO | Admitting: Internal Medicine

## 2019-10-24 DIAGNOSIS — E1142 Type 2 diabetes mellitus with diabetic polyneuropathy: Secondary | ICD-10-CM | POA: Diagnosis not present

## 2019-10-24 DIAGNOSIS — I87331 Chronic venous hypertension (idiopathic) with ulcer and inflammation of right lower extremity: Secondary | ICD-10-CM | POA: Diagnosis not present

## 2019-10-24 DIAGNOSIS — E11622 Type 2 diabetes mellitus with other skin ulcer: Secondary | ICD-10-CM | POA: Diagnosis not present

## 2019-10-24 DIAGNOSIS — L97812 Non-pressure chronic ulcer of other part of right lower leg with fat layer exposed: Secondary | ICD-10-CM | POA: Diagnosis not present

## 2019-10-25 NOTE — Progress Notes (Signed)
Dennis Wyatt, Dennis Wyatt ([id number]) Visit Report for 10/24/2019 HPI Details Patient Name: Date of Service: Dennis Wyatt, Dennis Wyatt 10/24/2019 2:15 PM Medical Record Number:[id number] Patient Account Number: [id number] Date of Birth/Sex: Treating RN: 25-Jul-1960 (59 y.o. Dennis KollerM) Dennis Wyatt Primary Care Provider: [name], Dennis Wyatt Other Clinician: Referring Provider: Treating Provider/Extender:, Dennis SarahMichael Wyatt, Dennis RimWilson Wyatt Wyatt in Treatment: 12 History of Present Illness HPI Description: ADMISSION 08/01/2019 This is a 59 year old man who is essentially self-referred although he [facility]. We did not have any information from them. He is here for review of 3 wounds on the right lower leg. He states about a month and a half ago he dropped his cell phone on the right anterior leg creating a wound. More recently he has had 2 blisters come out 1 medially just above the lateral malleolus and one on the posterior calf. All of these are roughly the same condition however. He has been applying some form of ointment were not really sure what this is. He does not have a wound history. 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 ABI in our clinic was 0.95 1/25; the patient has 3 wounds on the right leg. One is on the right anterior 1 just above the lateral malleolus and one on the posterior calf. We have been using Iodoflex under compression. The base of these wounds appears better 2/1; still the 3 wounds one on the right anterior 1 on the lateral and 1 on the posterior calf we have been using Iodoflex under compression making some general improvement in the wound bed. The patient went for his reflux studies on 1/28. This showed reflux in the common femoral vein and greater than 1 second  in the greater saphenous vein at the saphenofemoral junction but nothing in the more superficial veins below the knee either greater or lesser saphenous. I therefore I do not think he is going to be M amenable for an ablation. The report did note an aging consistent deep venous thrombosis involving the right popliteal artery. This led me to research [place] link. He had a similar finding on a an arterial duplex study in 2014 at which time he was recovering from a subarachnoid hemorrhage. This was seen by the rehab team who admitted him to rehab after the surgery for an anterior cerebral aneurysm. By that time it was felt safe to anticoagulate him and he was put on Xarelto and continued that according to the patient to the last year or 2 when it was stopped and he is only on aspirin. 2/8; all of his wounds appear to be making some progress. We have been using Iodoflex. Base of the wounds appear to be cleaning up nicely but still requiring debridement. We have tried to put Apligraf through his insurance related to deep punched out chronic venous insufficiency wounds and a type II diabetic. We will see if this is affordable 2/15; we are using Iodoflex on the wound areas. Everything looks like it is getting better the anterior and posterior wounds on the right contracted however the area on the lateral side is about the same size. We did not hear anything about Apligraf. 2/22; patient has a $200 per application out-of-pocket co-pay for Apligraf. This was beyond his means. He has good edema control 3/1; one of his wounds on the posterior calf is healed. The areas anteriorly  and laterally look healthy and are contracted. We are using Hydrofera Blue under compression 3/8; the patient's wound is on the anterior and lateral right leg. We are using Hydrofera Blue under compression making good progress 3/15; the patient's wound is on the anterior and lateral right leg. We are using Hydrofera Blue on  the compression and there is continued improvement in surface area. The wound that was on the posterior calf is still fully epithelialized 3/29; the patient has improving wounds on the anterior and lateral right leg. We are using Hydrofera Blue under continued compression. 4/5; continued improvements in the wounds on the anterior and lateral right leg we have been using Hydrofera Blue under compression. He has 20/30 below-knee compression stockings from elastic therapy we will discharge him in these. Hopefully he will be compliant 4/12; the patient's wounds on the anterior and lateral right leg have closed. He has bilateral 20/30 below-knee stockings from elastic therapy in [place]) Signed: 10/25/2019 5:51:00 PM By: [name] MD Entered By: [name] on 10/24/2019 15:18:44 -------------------------------------------------------------------------------- Physical Exam Details Patient Name: Date of Service: Dennis Wyatt, Dennis Wyatt 10/24/2019 2:15 PM Medical Record [id number] Patient Account Number: [id number] Date of Birth/Sex: Treating RN: 1961-07-03 (58 y.o. Dennis Wyatt Primary Care Provider: [name] Other Clinician: Referring Provider: Treating Provider/Extender:, Dennis Wyatt, Dennis Wyatt Wyatt in Treatment: 12 Constitutional Sitting or standing Blood Pressure is within target range for patient.. Pulse regular and within target range for patient.Marland Kitchen Respirations regular, non-labored and within target range.. Temperature is normal and within the target range for the patient.Marland Kitchen Appears in no distress. Notes Wound exam; both wounds are closed and fully epithelialized right anterior and right lateral. Edema control is good. Electronic Signature(s) Signed: 10/25/2019 5:51:00 PM By: [name] MD Entered By: [name] on 10/24/2019  15:19:34 -------------------------------------------------------------------------------- Physician Orders Details Patient Name: Date of Service: Dennis Wyatt, Dennis Wyatt 10/24/2019 2:15 PM Medical Record [id number] Patient Account Number: [id number] Date of Birth/Sex: Treating RN: [birth year] (58 y.o. Dennis Wyatt Primary Care Provider: [name] Other Clinician: Referring Provider: Treating Provider/Extender:, Dennis Wyatt, Dennis Wyatt Wyatt in Treatment: 12 Verbal / Phone Orders: No Diagnosis Coding ICD-10 Coding Code Description 2164521035 Non-pressure chronic ulcer of other part of right lower leg with fat layer exposed I87.331 Chronic venous hypertension (idiopathic) with ulcer and inflammation of right lower extremity E11.622 Type 2 diabetes mellitus with other skin ulcer E11.42 Type 2 diabetes mellitus with diabetic polyneuropathy Discharge From [facility] Services Discharge from Wound Care Center Skin Barriers/Peri-Wound Care Moisturizing lotion - both legs daily Edema Control Avoid standing for long periods of time Elevate legs to the level of the heart or above for 30 minutes daily and/or when sitting, a frequency of: - throughout the day Exercise regularly Support Garment 20-30 mm/Hg pressure to: - compression stockings to both legs daily. Apply first thing in the morning, remove at night before bed. Electronic Signature(s) Signed: 10/24/2019 6:12:10 PM By: Zandra Abts RN, BSN Signed: 10/25/2019 5:51:00 PM By: [name] MD Entered By: Zandra Abts on 10/24/2019 14:26:01 -------------------------------------------------------------------------------- Problem List Details Patient Name: Date of Service: Dennis Wyatt, Dennis Wyatt 10/24/2019 2:15 PM Medical Record [id number] Patient Account Number: [id number] Date of Birth/Sex: Treating RN: 1961/04/23 (58 y.o. Dennis Wyatt Primary Care Provider: [name] Other Clinician: Referring  Provider: Treating Provider/Extender:, Dennis Wyatt, Dennis Wyatt Wyatt in Treatment: 12 Active Problems ICD-10 Evaluated Encounter Code Description Active Date Today Diagnosis L97.812 Non-pressure chronic ulcer of other part of right lower 08/01/2019 No Yes leg  with fat layer exposed I87.331 Chronic venous hypertension (idiopathic) with ulcer 08/01/2019 No Yes and inflammation of right lower extremity E11.622 Type 2 diabetes mellitus with other skin ulcer 08/01/2019 No Yes E11.42 Type 2 diabetes mellitus with diabetic polyneuropathy 08/01/2019 No Yes Inactive Problems Resolved Problems Electronic Signature(s) Signed: 10/25/2019 5:51:00 PM By: Dennis Ham MD Entered By: Dennis Wyatt on 10/24/2019 15:17:54 -------------------------------------------------------------------------------- Progress Note Details Patient Name: Date of Service: Dennis Wyatt, Dennis Wyatt 10/24/2019 2:15 PM Medical Record [id number] Patient Account Number: [id number] Date of Birth/Sex: Treating RN: August 24, 1960 (59 y.o. [name] Primary Care Provider: [name] Other Clinician: Referring Provider: Treating Provider/Extender:, Dennis Wyatt, Dennis Wyatt Wyatt in Treatment: 12 Subjective History of Present Illness (HPI) ADMISSION 08/01/2019 This is a 59 year old man who is essentially self-referred although he gets his primary care at [facility]. We did not have any information from them. He is here for review of 3 wounds on the right lower leg. He states about a month and a half ago he dropped his cell phone on the right anterior leg creating a wound. More recently he has had 2 blisters come out 1 medially just above the lateral malleolus and one on the posterior calf. All of these are roughly the same condition however. He has been applying some form of ointment were not really sure what this is. He does not have a wound history. Past medical history includes  cigarette smoker, left total hip replacement in 2018, subarachnoid hemorrhage from a anterior communicating artery aneurysm in 2014, borderline diabetic but he apparently seemed receives weekly injections. ABI in our clinic was 0.95 1/25; the patient has 3 wounds on the right leg. One is on the right anterior 1 just above the lateral malleolus and one on the posterior calf. We have been using Iodoflex under compression. The base of these wounds appears better 2/1; still the 3 wounds one on the right anterior 1 on the lateral and 1 on the posterior calf we have been using Iodoflex under compression making some general improvement in the wound bed. The patient went for his reflux studies on 1/28. This showed reflux in the common femoral vein and greater than 1 second in the greater saphenous vein at the saphenofemoral junction but nothing in the more superficial veins below the knee either greater or lesser saphenous. I therefore I do not think he is going to be M amenable for an ablation. The report did note an aging consistent deep venous thrombosis involving the right popliteal artery. This led me to research [place] link. He had a similar finding on a an arterial duplex study in 2014 at which time he was recovering from a subarachnoid hemorrhage. This was seen by the rehab team who admitted him to rehab after the surgery for an anterior cerebral aneurysm. By that time it was felt safe to anticoagulate him and he was put on Xarelto and continued that according to the patient to the last year or 2 when it was stopped and he is only on aspirin. 2/8; all of his wounds appear to be making some progress. We have been using Iodoflex. Base of the wounds appear to be cleaning up nicely but still requiring debridement. We have tried to put Apligraf through his insurance related to deep punched out chronic venous insufficiency wounds and a type II diabetic. We will see if this is affordable 2/15; we  are using Iodoflex on the wound areas. Everything looks like it is getting better the anterior and  posterior wounds on the right contracted however the area on the lateral side is about the same size. We did not hear anything about Apligraf. 2/22; patient has a $200 per application out-of-pocket co-pay for Apligraf. This was beyond his means. He has good edema control 3/1; one of his wounds on the posterior calf is healed. The areas anteriorly and laterally look healthy and are contracted. We are using Hydrofera Blue under compression 3/8; the patient's wound is on the anterior and lateral right leg. We are using Hydrofera Blue under compression making good progress 3/15; the patient's wound is on the anterior and lateral right leg. We are using Hydrofera Blue on the compression and there is continued improvement in surface area. The wound that was on the posterior calf is still fully epithelialized 3/29; the patient has improving wounds on the anterior and lateral right leg. We are using Hydrofera Blue under continued compression. 4/5; continued improvements in the wounds on the anterior and lateral right leg we have been using Hydrofera Blue under compression. He has 20/30 below-knee compression stockings from elastic therapy we will discharge him in these. Hopefully he will be compliant 4/12; the patient's wounds on the anterior and lateral right leg have closed. He has bilateral 20/30 below-knee stockings from elastic therapy in [place] Objective Constitutional Sitting or standing Blood Pressure is within target range for patient.. Pulse regular and within target range for patient.Marland Kitchen Respirations regular, non-labored and within target range.. Temperature is normal and within the target range for the patient.Marland Kitchen Appears in no distress. Vitals Time Taken: 2:13 PM, Height: 70 in, Weight: 302 lbs, BMI: 43.3, Temperature: 98.4 F, Pulse: 57 bpm, Respiratory Rate: 18 breaths/min, Blood  Pressure: 133/53 mmHg. General Notes: Wound exam; both wounds are closed and fully epithelialized right anterior and right lateral. Edema control is good. Integumentary (Hair, Skin) Wound #1 status is Healed - Epithelialized. Original cause of wound was Trauma. The wound is located on the Right,Anterior Lower Leg. The wound measures 0cm length x 0cm width x 0cm depth; 0cm^2 area and 0cm^3 volume. Wound #2 status is Healed - Epithelialized. Original cause of wound was Gradually Appeared. The wound is located on the Right,Lateral Lower Leg. The wound measures 0cm length x 0cm width x 0cm depth; 0cm^2 area and 0cm^3 volume. Assessment Active Problems ICD-10 Non-pressure chronic ulcer of other part of right lower leg with fat layer exposed Chronic venous hypertension (idiopathic) with ulcer and inflammation of right lower extremity Type 2 diabetes mellitus with other skin ulcer Type 2 diabetes mellitus with diabetic polyneuropathy Plan Discharge From [facility] Services: Discharge from Wound Care Center Skin Barriers/Peri-Wound Care: Moisturizing lotion - both legs daily Edema Control: Avoid standing for long periods of time Elevate legs to the level of the heart or above for 30 minutes daily and/or when sitting, a frequency of: - throughout the day Exercise regularly Support Garment 20-30 mm/Hg pressure to: - compression stockings to both legs daily. Apply first thing in the morning, remove at night before bed. #1 the patient can be discharged from the wound care center to his own 20/30 below-knee stockings. Skin lubrication emphasized. Electronic Signature(s) Signed: 10/25/2019 5:51:00 PM By: [name] MD Entered By: [name] on 10/24/2019 15:21:00 -------------------------------------------------------------------------------- SuperBill Details Patient Name: Date of Service: ENOCH, MOFFA 10/24/2019 Medical Record Number:[id number] Patient Account Number: [id number] Date of  Birth/Sex: Treating RN: 06-Jan-1961 (58 y.o. Dennis Wyatt Primary Care Provider: [name] Other Clinician: Referring Provider: Treating Provider/Extender:, Dennis Wyatt, Dennis Wyatt Wyatt  in Treatment: 12 Diagnosis Coding ICD-10 Codes Code Description [id number] Non-pressure chronic ulcer of other part of right lower leg with fat layer exposed I87.331 Chronic venous hypertension (idiopathic) with ulcer and inflammation of right lower extremity E11.622 Type 2 diabetes mellitus with other skin ulcer E11.42 Type 2 diabetes mellitus with diabetic polyneuropathy Facility Procedures CPT4 Code: [id number] Description: 99213 - WOUND CARE VISIT-LEV 3 EST PT Modifier: Quantity: 1 Physician Procedures CPT4 Code Description: [id number] - WC PHYS LEVEL 2 - EST PT ICD-10 Diagnosis Description L97.812 Non-pressure chronic ulcer of other part of right lower le I87.331 Chronic venous hypertension (idiopathic) with ulcer and in lower extremity E11.622  Type 2 diabetes mellitus with other skin ulcer Modifier: g with fat layer flammation of ri Quantity: 1 exposed ght Electronic Signature(s) Signed: 10/24/2019 6:12:10 PM By: Zandra Abts RN, BSN Signed: 10/25/2019 5:51:00 PM By: [name] MD Entered By: Zandra Abts on 10/24/2019 18:04:16

## 2019-10-25 NOTE — Progress Notes (Signed)
Dennis Wyatt, Dennis Wyatt ([id number]) Visit Report for 10/24/2019 Arrival Information Details Patient Name: Date of Service: Dennis Wyatt, Dennis Wyatt 10/24/2019 2:15 PM Medical Record [id number] Patient Account Number: [id number] Date of Birth/Sex: Treating RN: [birth year] (59 y.o. Lorette Ang, Meta.Reding Primary Care Alam Guterrez: [name] Other Clinician: Referring Jonahtan Manseau: Treating Stepfanie Yott/Extender:[name], [name], [name] Weeks in Treatment: 12 Visit Information History Since Last Visit Added or deleted any medications: No Patient Arrived: Ambulatory Any new allergies or adverse reactions: No Arrival Time: 14:08 Had a fall or experienced change in No Accompanied By: self activities of daily living that may affect Transfer Assistance: None risk of falls: Patient Identification Verified: Yes Signs or symptoms of abuse/neglect since last No Secondary Verification Process Completed: Yes visito Patient Requires Transmission-Based No Hospitalized since last visit: No Precautions: Implantable device outside of the clinic excluding No Patient Has Alerts: No cellular tissue based products placed in the center since last visit: Has Dressing in Place as Prescribed: Yes Has Compression in Place as Prescribed: Yes Pain Present Now: Yes Electronic Signature(s) Signed: 10/24/2019 5:40:16 PM By: [name] Entered By: [name] on 10/24/2019 14:10:59 -------------------------------------------------------------------------------- Clinic Level of Care Assessment Details Patient Name: Date of Service: Dennis Wyatt, Dennis Wyatt 10/24/2019 2:15 PM Medical Record [id number] Patient Account Number: [id number] Date of Birth/Sex: Treating RN: March 09, 1961 (60 y.o. [name] Primary Care Detron Carras: [name] Other Clinician: Referring Ashtian Villacis: Treating Nikolay Demetriou/Extender:[name], [name], [name] Weeks in Treatment: 12 Clinic Level of Care Assessment  Items TOOL 4 Quantity Score X - Use when only an EandM is performed on FOLLOW-UP visit 1 0 ASSESSMENTS - Nursing Assessment / Reassessment X - Reassessment of Co-morbidities (includes updates in patient status) 1 10 X - Reassessment of Adherence to Treatment Plan 1 5 ASSESSMENTS - Wound and Skin Assessment / Reassessment []  - Simple Wound Assessment / Reassessment - one wound 0 X - Complex Wound Assessment / Reassessment - multiple wounds 2 5 []  - Dermatologic / Skin Assessment (not related to wound area) 0 ASSESSMENTS - Focused Assessment []  - Circumferential Edema Measurements - multi extremities 0 []  - Nutritional Assessment / Counseling / Intervention 0 X - Lower Extremity Assessment (monofilament, tuning fork, pulses) 1 5 []  - Peripheral Arterial Disease Assessment (using hand held doppler) 0 ASSESSMENTS - Ostomy and/or Continence Assessment and Care []  - Incontinence Assessment and Management 0 []  - Ostomy Care Assessment and Management (repouching, etc.) 0 PROCESS - Coordination of Care X - Simple Patient / Family Education for ongoing care 1 15 []  - Complex (extensive) Patient / Family Education for ongoing care 0 X - Staff obtains Programmer, systems, Records, Test Results / Process Orders 1 10 []  - Staff telephones HHA, Nursing Homes / Clarify orders / etc 0 []  - Routine Transfer to another Facility (non-emergent condition) 0 []  - Routine Hospital Admission (non-emergent condition) 0 []  - New Admissions / Biomedical engineer / Ordering NPWT, Apligraf, etc. 0 []  - Emergency Hospital Admission (emergent condition) 0 X - Simple Discharge Coordination 1 10 []  - Complex (extensive) Discharge Coordination 0 PROCESS - Special Needs []  - Pediatric / Minor Patient Management 0 []  - Isolation Patient Management 0 []  - Hearing / Language / Visual special needs 0 []  - Assessment of Community assistance (transportation, D/C planning, etc.) 0 []  - Additional assistance / Altered mentation  0 []  - Support Surface(s) Assessment (bed, cushion, seat, etc.) 0 INTERVENTIONS - Wound Cleansing / Measurement []  - Simple Wound Cleansing - one wound 0 X - Complex Wound Cleansing - multiple  wounds 2 5 X - Wound Imaging (photographs - any number of wounds) 1 5 []  - Wound Tracing (instead of photographs) 0 []  - Simple Wound Measurement - one wound 0 X - Complex Wound Measurement - multiple wounds 2 5 INTERVENTIONS - Wound Dressings []  - Small Wound Dressing one or multiple wounds 0 []  - Medium Wound Dressing one or multiple wounds 0 []  - Large Wound Dressing one or multiple wounds 0 []  - Application of Medications - topical 0 []  - Application of Medications - injection 0 INTERVENTIONS - Miscellaneous []  - External ear exam 0 []  - Specimen Collection (cultures, biopsies, blood, body fluids, etc.) 0 []  - Specimen(s) / Culture(s) sent or taken to Lab for analysis 0 []  - Patient Transfer (multiple staff / / Similar devices) 0 []  - Simple Staple / Suture removal (25 or less) 0 []  - Complex Staple / Suture removal (26 or more) 0 []  - Hypo / Hyperglycemic Management (close monitor of Blood Glucose) 0 []  - Ankle / Brachial Index (ABI) - do not check if billed separately 0 X - Vital Signs 1 5 Has the patient been seen at the hospital within the last three years: Yes Total Score: 95 Level Of Care: New/Established - Level 3 Electronic Signature(s) Signed: 10/24/2019 6:12:10 PM By: RN, BSN Entered By: on 10/24/2019 18:04:06 -------------------------------------------------------------------------------- Encounter Discharge Information Details Patient Name: Date of Service: Dennis Wyatt, Dennis Wyatt 10/24/2019 2:15 PM Medical Record Patient Account Number: Date of Birth/Sex: Treating RN: 02/02/1961 (58 y.o. Primary Care Martine Bleecker: Nurse, adult Other Clinician: Referring Marte Celani: Treating  Debara Kamphuis/Extender:[name], , Weeks in Treatment: 12 Encounter Discharge Information Items Discharge Condition: Stable Ambulatory Status: Cane Discharge Destination: Home Transportation: Private Auto Accompanied By: self Schedule Follow-up Appointment: Yes Clinical Summary of Care: Patient Declined Electronic Signature(s) Signed: 10/24/2019 5:40:42 PM By: Entered By: 12/24/2019 on 10/24/2019 14:32:12 -------------------------------------------------------------------------------- Lower Extremity Assessment Details Patient Name: Date of Service: Dennis Wyatt, Dennis Wyatt 10/24/2019 2:15 PM Medical Record Jeannette How Patient Account Number: 12/24/2019 Date of Birth/Sex: Treating RN: September 24, 1960 (58 y.o. [id number] Primary Care Denae Zulueta: 03/08/1961 Other Clinician: Referring Jacek Colson: Treating Tessah Patchen/Extender:[name], Katherina Right, [name] Weeks in Treatment: 12 Edema Assessment Assessed: [Left: No] [Right: Yes] Edema: [Left: N] [Right: o] Calf Left: Right: Point of Measurement: 36 cm From Medial Instep cm 37 cm Ankle Left: Right: Point of Measurement: 9 cm From Medial Instep cm 24 cm Vascular Assessment Pulses: Dorsalis Pedis Palpable: [Right:Yes] Electronic Signature(s) Signed: 10/24/2019 5:40:16 PM By: [name] Entered By: 12/24/2019 on 10/24/2019 14:14:22 -------------------------------------------------------------------------------- Multi Wound Chart Details Patient Name: Date of Service: Dennis Wyatt, Dennis Wyatt 10/24/2019 2:15 PM Medical Record Jeannette How Patient Account Number: 12/24/2019 Date of Birth/Sex: Treating RN: 08/18/1960 (58 y.o. [id number] Primary Care Ami Mally: 03/08/1961 Other Clinician: Referring Jaydis Duchene: Treating Indria Bishara/Extender:[name], Tammy Sours, [name] Weeks in Treatment: 12 Vital Signs Height(in): 70 Pulse(bpm): 57 Weight(lbs): 302 Blood  Pressure(mmHg): 133/53 Body Mass Index(BMI): 43 Temperature(F): 98.4 Respiratory 18 Rate(breaths/min): Photos: [1:No Photos] [2:No Photos] [N/A:N/A] Wound Location: [1:Right, Anterior Lower Leg Right, Lateral Lower Leg] [N/A:N/A] Wounding Event: [1:Trauma] [2:Gradually Appeared] [N/A:N/A] Primary Etiology: [1:Venous Leg Ulcer] [2:Venous Leg Ulcer] [N/A:N/A] Secondary Etiology: [1:Diabetic Wound/Ulcer of the Diabetic Wound/Ulcer of the N/A Lower Extremity] [2:Lower Extremity] Date Acquired: [1:06/14/2019] [2:07/11/2019] [N/A:N/A] Weeks of Treatment: [1:12] [2:12] [N/A:N/A] Wound Status: [1:Healed - Epithelialized] [2:Healed - Epithelialized] [N/A:N/A] Measurements L x W x D 0x0x0 [2:0x0x0] [N/A:N/A] (  cm) Area (cm) : [1:0] [2:0] [N/A:N/A] Volume (cm) : [1:0] [2:0] [N/A:N/A] % Reduction in Area: [1:100.00%] [2:100.00%] [N/A:N/A] % Reduction in Volume: 100.00% [2:100.00%] [N/A:N/A] Classification: [1:Full Thickness Without Exposed Support Structures Exposed Support Structures] [2:Full Thickness Without] [N/A:N/A] Treatment Notes Electronic Signature(s) Signed: 10/24/2019 6:12:10 PM By: Zandra Abts RN, BSN Signed: 10/25/2019 5:51:00 PM By: [name] MD Entered By: [name] on 10/24/2019 15:18:04 -------------------------------------------------------------------------------- Multi-Disciplinary Care Plan Details Patient Name: Date of Service: Dennis Wyatt, Dennis Wyatt 10/24/2019 2:15 PM Medical Record [id number] Patient Account Number: [id number] Date of Birth/Sex: Treating RN: [birth year] (58 y.o. [name] Primary Care Angeleen Horney: [name] Other Clinician: Referring Dennis Wyatt Beckstrand: Treating Cason Luffman/Extender:[name], [name], [name]: 12 Active Inactive Electronic Signature(s) Signed: 10/24/2019 6:12:10 PM By: Zandra Abts RN, BSN Entered By: Zandra Abts on 10/24/2019  18:03:29 -------------------------------------------------------------------------------- Pain Assessment Details Patient Name: Date of Service: Dennis Wyatt, Dennis Wyatt 10/24/2019 2:15 PM Medical Record [id number] Patient Account Number: [id number] Date of Birth/Sex: Treating RN: 1960-10-28 (58 y.o. Tammy Sours Primary Care Latunya Kissick: [name] Other Clinician: Referring Duvall Comes: Treating Sharief Wainwright/Extender:[name], [name], [name]: 12 Active Problems Location of Pain Severity and Description of Pain Patient Has Paino Yes Site Locations Pain Location: Generalized Pain Rate the pain. Current Pain Level: 5 Worst Pain Level: 10 Least Pain Level: 0 Tolerable Pain Level: 8 Pain Management and Medication Current Pain Management: Medication: Yes Cold Application: No Rest: Yes Massage: No Activity: No T.E.N.S.: No Heat Application: No Leg drop or elevation: Yes Is the Current Pain Management Adequate: Adequate How does your wound impact your activities of daily livingo Sleep: No Bathing: No Appetite: No Relationship With Others: No Bladder Continence: No Emotions: No Bowel Continence: No Work: No Toileting: No Drive: No Dressing: No Hobbies: No Electronic Signature(s) Signed: 10/24/2019 5:40:16 PM By: Shawn Stall Entered By: Shawn Stall on 10/24/2019 14:11:21 -------------------------------------------------------------------------------- Patient/Caregiver Education Details Patient Name: Date of Service: KYEN, TAITE 4/12/2021andnbsp2:15 PM Medical Record [id number] Patient Account Number: [id number] Date of Birth/Gender: Treating RN: 05/16/1961 (59 y.o. [name] Primary Care Physician: [name] Other Clinician: Referring Physician: Treating Physician/Extender:[name], [name], [name]: 12 Education Assessment Education Provided To: Patient Education  Topics Provided Wound/Skin Impairment: Methods: Explain/Verbal Responses: State content correctly Electronic Signature(s) Signed: 10/24/2019 6:12:10 PM By: Zandra Abts RN, BSN Entered By: Zandra Abts on 10/24/2019 18:03:39 -------------------------------------------------------------------------------- Wound Assessment Details Patient Name: Date of Service: Dennis Wyatt, Dennis Wyatt 10/24/2019 2:15 PM Medical Record [id number] Patient Account Number: [id number] Date of Birth/Sex: Treating RN: 1961/03/02 (58 y.o. [name], [name] Primary Care Harden Bramer: [name] Other Clinician: Referring Dede Dobesh: Treating Louis Gaw/Extender:[name], [name], [name]: 12 Wound Status Wound Number: 1 Primary Etiology: Venous Leg Ulcer Wound Location: Right, Anterior Lower Leg Secondary Diabetic Wound/Ulcer of the Lower Etiology: Extremity Wounding Event: Trauma Wound Status: Healed - Epithelialized Date Acquired: 06/14/2019 Weeks Of Treatment: 12 Clustered Wound: No Wound Measurements Length: (cm) 0 % Reduct Width: (cm) 0 % Reduct Depth: (cm) 0 Area: (cm) 0 Volume: (cm) 0 Wound Description Full Thickness Without Exposed Support Classification: Structures ion in Area: 100% ion in Volume: 100% Electronic Signature(s) Signed: 10/24/2019 5:40:16 PM By: Shawn Stall Entered By: Shawn Stall on 10/24/2019 14:14:39 -------------------------------------------------------------------------------- Wound Assessment Details Patient Name: Date of Service: Dennis Wyatt, Dennis Wyatt 10/24/2019 2:15 PM Medical Record [id number] Patient Account Number: [id number] Date of Birth/Sex: Treating RN: 12-30-1960 (58 y.o. Tammy Sours Primary Care Nezar Buckles: [name] Other Clinician: Referring Malayia Spizzirri:  Treating Laiyah Exline/Extender:[name], [name], [name]: 12 Wound Status Wound Number: 2 Primary Etiology: Venous Leg  Ulcer Wound Location: Right, Lateral Lower Leg Secondary Diabetic Wound/Ulcer of the Lower Etiology: Extremity Wounding Event: Gradually Appeared Wound Status: Healed - Epithelialized Date Acquired: 07/11/2019 Weeks Of Treatment: 12 Clustered Wound: No Wound Measurements Length: (cm) 0 % Reduct Width: (cm) 0 % Reduct Depth: (cm) 0 Area: (cm) 0 Volume: (cm) 0 Wound Description Classification: Full Thickness Without Exposed Support Structures ion in Area: 100% ion in Volume: 100% Electronic Signature(s) Signed: 10/24/2019 5:40:16 PM By: Shawn Stall Entered By: Shawn Stall on 10/24/2019 14:14:40 -------------------------------------------------------------------------------- Vitals Details Patient Name: Date of Service: Dennis Wyatt, Dennis Wyatt 10/24/2019 2:15 PM Medical Record [id number] Patient Account Number: [id number] Date of Birth/Sex: Treating RN: 1960-11-29 (58 y.o. [name], [name] Primary Care Keithan Dileonardo: [name] Other Clinician: Referring Antoneo Ghrist: Treating Rossy Virag/Extender:[name], [name], [name]: 12 Vital Signs Time Taken: 14:13 Temperature (F): 98.4 Height (in): 70 Pulse (bpm): 57 Weight (lbs): 302 Respiratory Rate (breaths/min): 18 Body Mass Index (BMI): 43.3 Blood Pressure (mmHg): 133/53 Reference Range: 80 - 120 mg / dl Electronic Signature(s) Signed: 10/24/2019 5:40:16 PM By: Shawn Stall Entered By: Shawn Stall on 10/24/2019 14:15:05

## 2019-10-26 NOTE — Progress Notes (Signed)
Dennis Wyatt, Dennis Wyatt ([id number]) Visit Report for 09/19/2019 Arrival Information Details Patient Name: Date of Service: Dennis Wyatt, Dennis Wyatt 09/19/2019 1:30 PM Medical Record [id number] Patient Account Number: [id number] Date of Birth/Sex: Treating RN: 1961/03/22 (59 y.o. Jonette Eva, Briant Cedar Primary Care Alecia Doi: [name] Other Clinician: Referring Nakiesha Rumsey: Treating Nolyn Eilert/Extender:[name], [name], [name] Weeks in Treatment: 7 Visit Information History Since Last Visit Cane Added or deleted any medications: No Patient Arrived: 13:58 Any new allergies or adverse reactions: No Arrival Time: Had a fall or experienced change in No Accompanied By: self None activities of daily living that may affect Transfer Assistance: risk of falls: Patient Identification Verified: Yes Signs or symptoms of abuse/neglect since last No Secondary Verification Process Completed: Yes visito Patient Requires Transmission-Based No Hospitalized since last visit: No Precautions: Implantable device outside of the clinic excluding No Patient Has Alerts: No cellular tissue based products placed in the center since last visit: Has Dressing in Place as Prescribed: Yes Pain Present Now: No Electronic Signature(s) Signed: 10/26/2019 9:21:08 AM By: Sandre Kitty Entered By: Sandre Kitty on 09/19/2019 13:59:09 -------------------------------------------------------------------------------- Compression Therapy Details Patient Name: Date of Service: Dennis Wyatt, Dennis Wyatt 09/19/2019 1:30 PM Medical Record [id number] Patient Account Number: [id number] Date of Birth/Sex: Treating RN: 1961-03-12 (59 y.o. [name] Primary Care Lennon Boutwell: [name] Other Clinician: Referring Bobbette Eakes: Treating Christ Fullenwider/Extender:[name], [name], [name] Weeks in Treatment: 7 Compression Therapy Performed for Wound Wound #1 Right,Anterior Lower Leg Assessment: Performed By:  Clinician [name], RN Compression Type: Three Layer Post Procedure Diagnosis Same as Pre-procedure Electronic Signature(s) Signed: 09/19/2019 5:59:01 PM By: [name] RN, BSN Entered By: [name] on 09/19/2019 14:55:02 -------------------------------------------------------------------------------- Compression Therapy Details Patient Name: Date of Service: Dennis Wyatt, Dennis Wyatt 09/19/2019 1:30 PM Medical Record [id number] Patient Account Number: [id number] Date of Birth/Sex: Treating RN: 07-Jun-1961 (59 y.o. [name] Primary Care Minka Knight: [name] Other Clinician: Referring Briann Sarchet: Treating Rynlee Lisbon/Extender:[name], [name], [name] Weeks in Treatment: 7 Compression Therapy Performed for Wound Wound #2 Right,Lateral Lower Leg Assessment: Performed By: Clinician [name], RN Compression Type: Three Layer Post Procedure Diagnosis Same as Pre-procedure Electronic Signature(s) Signed: 09/19/2019 5:59:01 PM By: [name] RN, BSN Entered By: [name] on 09/19/2019 14:55:02 -------------------------------------------------------------------------------- Encounter Discharge Information Details Patient Name: Date of Service: Dennis Wyatt, Dennis Wyatt 09/19/2019 1:30 PM Medical Record [id number] Patient Account Number: [id number] Date of Birth/Sex: Treating RN: April 17, 1961 (59 y.o. Lorette Ang, Meta.Reding Primary Care Evalise Abruzzese: [name] Other Clinician: Referring Sukhman Martine: Treating Phillp Dolores/Extender:[name], [name], [name] Weeks in Treatment: 7 Encounter Discharge Information Items Discharge Condition: Stable Ambulatory Status: Cane Discharge Destination: Home Transportation: Private Auto Accompanied By: self Schedule Follow-up Appointment: Yes Clinical Summary of Care: Electronic Signature(s) Signed: 09/19/2019 5:42:31 PM By: [name] Entered By: [name] on 09/19/2019  15:08:00 -------------------------------------------------------------------------------- Lower Extremity Assessment Details Patient Name: Date of Service: Dennis Wyatt, Dennis Wyatt 09/19/2019 1:30 PM Medical Record [id number] Patient Account Number: [id number] Date of Birth/Sex: Treating RN: Nov 16, 1960 (58 y.o. Oval Linsey Primary Care Adriahna Shearman: [name] Other Clinician: Referring Ryker Pherigo: Treating Paddy Neis/Extender:[name], [name], [name] Weeks in Treatment: 7 Edema Assessment Assessed: [Left: No] [Right: No] Edema: [Left: Ye] [Right: s] Calf Left: Right: Point of Measurement: 36 cm From Medial Instep cm 44 cm Ankle Left: Right: Point of Measurement: 9 cm From Medial Instep cm 24 cm Electronic Signature(s) Signed: 09/21/2019 7:20:02 AM By: Carlene Coria RN Entered By: Carlene Coria on 09/19/2019 14:26:05 -------------------------------------------------------------------------------- Multi Wound Chart Details Patient Name: Date of Service: Dennis Wyatt, Dennis Wyatt 09/19/2019  1:30 PM Medical Record [id number] Patient Account Number: [id number] Date of Birth/Sex: Treating RN: 19-Sep-1960 (59 y.o. [name] Primary Care Wister Hoefle: [name] Other Clinician: Referring Laurali Goddard: Treating Emori Mumme/Extender:[name], [name], [name] Weeks in Treatment: 7 Vital Signs Height(in): 70 Pulse(bpm): 46 Weight(lbs): 302 Blood Pressure(mmHg): 141/70 Body Mass Index(BMI): 43 Temperature(F): 98.1 Respiratory 19 19 Rate(breaths/min): Photos: [1:No Photos] [2:No Photos] [N/A:N/A] Wound Location: [1:Right Lower Leg - Anterior Right Lower Leg - Lateral N/A] Wounding Event: [1:Trauma] [2:Gradually Appeared] [N/A:N/A] Primary Etiology: [1:Venous Leg Ulcer] [2:Venous Leg Ulcer] [N/A:N/A] Secondary Etiology: [1:Diabetic Wound/Ulcer of the Diabetic Wound/Ulcer of the N/A Lower Extremity] [2:Lower Extremity] Comorbid History: [1:Deep Vein  Thrombosis, Hypertension, Type II Diabetes, Osteoarthritis] [2:Deep Vein Thrombosis, Hypertension, Type II Diabetes, Osteoarthritis] [N/A:N/A] Date Acquired: [1:06/14/2019] [2:07/11/2019] [N/A:N/A] Weeks of Treatment: [1:7] [2:7] [N/A:N/A] Wound Status: [1:Open] [2:Open] [N/A:N/A] Measurements L x W x D 2.7x1.7x0.1 [2:1.8x1.2x0.2] [N/A:N/A] (cm) Area (cm) : [1:3.605] [2:1.696] [N/A:N/A] Volume (cm) : [1:0.36] [2:0.339] [N/A:N/A] % Reduction in Area: [1:55.70%] [2:48.60%] [N/A:N/A] % Reduction in Volume: 77.90% [2:65.80%] [N/A:N/A] Classification: [1:Full Thickness Without Exposed Support Structures Exposed Support Structures] [2:Full Thickness Without] [N/A:N/A] Exudate Amount: [1:Small] [2:Small] [N/A:N/A] Exudate Type: [1:Serosanguineous] [2:Serosanguineous] [N/A:N/A] Exudate Color: [1:red, brown] [2:red, brown] [N/A:N/A] Wound Margin: [1:Flat and Intact] [2:Flat and Intact] [N/A:N/A] Granulation Amount: [1:Large (67-100%)] [2:Medium (34-66%)] [N/A:N/A] Granulation Quality: [1:Red] [2:Red] [N/A:N/A] Necrotic Amount: [1:Small (1-33%)] [2:Medium (34-66%)] [N/A:N/A] Exposed Structures: [1:Fat Layer (Subcutaneous Fat Layer (Subcutaneous N/A Tissue) Exposed: Yes Fascia: No Tendon: No Muscle: No Joint: No Bone: No] [2:Tissue) Exposed: Yes Fascia: No Tendon: No Muscle: No Joint: No Bone: No] Epithelialization: [1:Small (1-33%)] [2:None Compression Therapy] [N/A:N/A N/A] Treatment Notes Wound #1 (Right, Anterior Lower Leg) 1. Cleanse With Wound Cleanser Soap and water 2. Periwound Care Moisturizing lotion TCA Cream 3. Primary Dressing Applied Hydrofera Blue 4. Secondary Dressing ABD Pad Dry Gauze 6. Support Layer Applied 3 layer compression wrap Notes netting. explained how to order compression stockings and leg measurements to patient. Wound #2 (Right, Lateral Lower Leg) 1. Cleanse With Wound Cleanser Soap and water 2. Periwound Care Moisturizing lotion TCA Cream 3. Primary  Dressing Applied Hydrofera Blue 4. Secondary Dressing ABD Pad Dry Gauze 6. Support Layer Applied 3 layer compression wrap Notes netting. explained how to order compression stockings and leg measurements to patient. Electronic Signature(s) Signed: 09/19/2019 5:29:15 PM By: Linton Ham MD Signed: 09/19/2019 5:59:01 PM By: [name] RN, BSN Entered By: Linton Ham on 09/19/2019 15:25:59 -------------------------------------------------------------------------------- Multi-Disciplinary Care Plan Details Patient Name: Date of Service: Dennis Wyatt, Dennis Wyatt 09/19/2019 1:30 PM Medical Record [id number] Patient Account Number: [id number] Date of Birth/Sex: Treating RN: 1961/07/12 (59 y.o. [name] Primary Care Yida Hyams: [name] Other Clinician: Referring Amanee Iacovelli: Treating Sota Hetz/Extender:[name], [name], [name] Weeks in Treatment: 7 Active Inactive Venous Leg Ulcer Nursing Diagnoses: Knowledge deficit related to disease process and management Potential for venous Insuffiency (use before diagnosis confirmed) Goals: Patient will maintain optimal edema control Date Initiated: 08/01/2019 Target Resolution Date: 09/30/2019 Goal Status: Active Patient/caregiver will verbalize understanding of disease process and disease management Date Initiated: 08/01/2019 Date Inactivated: 08/29/2019 Target Resolution Date: 09/02/2019 Goal Status: Met Interventions: Assess peripheral edema status every visit. Compression as ordered Provide education on venous insufficiency Notes: Wound/Skin Impairment Nursing Diagnoses: Impaired tissue integrity Knowledge deficit related to smoking impact on wound healing Knowledge deficit related to ulceration/compromised skin integrity Goals: Patient/caregiver will verbalize understanding of skin care regimen Date Initiated: 08/01/2019 Target Resolution Date: 09/30/2019 Goal Status: Active Interventions: Assess  patient/caregiver ability to obtain necessary supplies Assess patient/caregiver ability to perform ulcer/skin care regimen upon admission and as needed Assess ulceration(s) every visit Provide education on ulcer and skin care Notes: Electronic Signature(s) Signed: 09/19/2019 5:59:01 PM By: [name] RN, BSN Entered By: [name] on 09/19/2019 14:52:21 -------------------------------------------------------------------------------- Pain Assessment Details Patient Name: Date of Service: Dennis Wyatt, Dennis Wyatt 09/19/2019 1:30 PM Medical Record [id number] Patient Account Number: [id number] Date of Birth/Sex: Treating RN: [birth year] (59 y.o. [name] Primary Care Daliana Leverett: [name] Other Clinician: Referring Ruthvik Barnaby: Treating Areanna Gengler/Extender:[name], [name], [name] Weeks in Treatment: 7 Active Problems Location of Pain Severity and Description of Pain Patient Has Paino No Site Locations Pain Management and Medication Current Pain Management: Electronic Signature(s) Signed: 09/19/2019 5:59:01 PM By: [name] RN, BSN Signed: 10/26/2019 9:21:08 AM By: Sandre Kitty Entered By: Sandre Kitty on 09/19/2019 14:01:00 -------------------------------------------------------------------------------- Patient/Caregiver Education Details Patient Name: Date of Service: Dennis Wyatt, Dennis Wyatt 3/8/2021andnbsp1:30 PM Medical Record [id number] Patient Account Number: [id number] Date of Birth/Gender: Treating RN: 1961-04-16 (59 y.o. [name] Primary Care Physician: [name] Other Clinician: Referring Physician: Treating Physician/Extender:[name], [name], [name] Weeks in Treatment: 7 Education Assessment Education Provided To: Patient Education Topics Provided Wound/Skin Impairment: Methods: Explain/Verbal Responses: State content correctly Electronic Signature(s) Signed: 09/19/2019 5:59:01 PM By: [name] RN, BSN Entered By: [name] on 09/19/2019 14:52:40 -------------------------------------------------------------------------------- Wound Assessment Details Patient Name: Date of Service: Dennis Wyatt, Dennis Wyatt 09/19/2019 1:30 PM Medical Record [id number] Patient Account Number: [id number] Date of Birth/Sex: Treating RN: 12/02/1960 (59 y.o. [name] Primary Care Amiyah Shryock: [name] Other Clinician: Referring Graciana Sessa: Treating Graham Doukas/Extender:[name], [name], [name] Weeks in Treatment: 7 Wound Status Wound Number: 1 Primary Venous Leg Ulcer Etiology: Wound Location: Right Lower Leg - Anterior Secondary Diabetic Wound/Ulcer of the Lower Wounding Event: Trauma Etiology: Extremity Date Acquired: 06/14/2019 Wound Open Weeks Of Treatment: 7 Status: Clustered Wound: No Comorbid Deep Vein Thrombosis, Hypertension, Type History: II Diabetes, Osteoarthritis Photos Wound Measurements Length: (cm) 2.7 % Reduct Width: (cm) 1.7 % Reduct Depth: (cm) 0.1 Epitheli Area: (cm) 3.605 Tunneli Volume: (cm) 0.36 Undermi Wound Description Full Thickness Without Exposed Support Foul Odo Classification: Structures Slough/F Wound Flat and Intact Margin: Exudate Small Amount: Exudate Serosanguineous Type: Exudate red, brown Color: Wound Bed Granulation Amount: Large (67-100%) Granulation Quality: Red Fascia E Necrotic Amount: Small (1-33%) Fat Laye Necrotic Quality: Adherent Slough Tendon E Muscle E Joint Ex Bone Exp r After Cleansing: No ibrino Yes Exposed Structure xposed: No r (Subcutaneous Tissue) Exposed: Yes xposed: No xposed: No posed: No osed: No ion in Area: 55.7% ion in Volume: 77.9% alization: Small (1-33%) ng: No ning: No Electronic Signature(s) Signed: 09/20/2019 3:29:05 PM By: Mikeal Hawthorne EMT/HBOT Signed: 10/26/2019 9:04:28 AM By: [name] RN, BSN Entered By: Mikeal Hawthorne on 09/20/2019  14:08:08 -------------------------------------------------------------------------------- Wound Assessment Details Patient Name: Date of Service: Dennis Wyatt, Dennis Wyatt 09/19/2019 1:30 PM Medical Record [id number] Patient Account Number: [id number] Date of Birth/Sex: Treating RN: 09/23/1960 (59 y.o. [name] Primary Care Azariyah Luhrs: [name] Other Clinician: Referring Dennis Wyatt Dennis Wyatt: Treating Aliana Kreischer/Extender:[name], [name], [name] Weeks in Treatment: 7 Wound Status Wound Number: 2 Primary Venous Leg Ulcer Etiology: Wound Location: Right Lower Leg - Lateral Secondary Diabetic Wound/Ulcer of the Lower Wounding Event: Gradually Appeared Etiology: Extremity Date Acquired: 07/11/2019 Wound Open Weeks Of Treatment: 7 Status: Clustered Wound: No Comorbid Deep Vein Thrombosis, Hypertension, Type History: II Diabetes, Osteoarthritis Photos Wound Measurements Length: (cm) 1.8 % Reduct Width: (cm) 1.2 %  Reduct Depth: (cm) 0.2 Epitheli Area: (cm) 1.696 Tunneli Volume: (cm) 0.339 Undermi Wound Description Full Thickness Without Exposed Support Foul Odo Classification: Structures Slough/F Wound Flat and Intact Margin: Exudate Small Amount: Exudate Serosanguineous Type: Exudate red, brown Color: Wound Bed Granulation Amount: Medium (34-66%) Granulation Quality: Red Fascia Expo Necrotic Amount: Medium (34-66%) Fat Layer ( Necrotic Quality: Adherent Slough Tendon Expo Muscle Expo Joint Expos Bone Expose r After Cleansing: No ibrino Yes Exposed Structure sed: No Subcutaneous Tissue) Exposed: Yes sed: No sed: No ed: No d: No ion in Area: 48.6% ion in Volume: 65.8% alization: None ng: No ning: No Electronic Signature(s) Signed: 09/20/2019 3:29:05 PM By: Mikeal Hawthorne EMT/HBOT Signed: 10/26/2019 9:04:28 AM By: [name] RN, BSN Entered By: Mikeal Hawthorne on 09/20/2019  14:08:49 -------------------------------------------------------------------------------- Pooler Details Patient Name: Date of Service: MAVERYCK, BAHRI 09/19/2019 1:30 PM Medical Record [id number] Patient Account Number: [id number] Date of Birth/Sex: Treating RN: [birth year] (59 y.o. [name] Primary Care Rasha Ibe: [name] Other Clinician: Referring Earley Grobe: Treating Sharronda Schweers/Extender:[name], [name], [name] Weeks in Treatment: 7 Vital Signs Time Taken: 13:59 Temperature (F): 98.1 Height (in): 70 Pulse (bpm): 66 Weight (lbs): 302 Respiratory Rate (breaths/min): 19 Body Mass Index (BMI): 43.3 Blood Pressure (mmHg): 141/70 Reference Range: 80 - 120 mg / dl Electronic Signature(s) Signed: 10/26/2019 9:21:08 AM By: Sandre Kitty Entered By: Sandre Kitty on 09/19/2019 14:00:53

## 2019-10-27 DIAGNOSIS — F17210 Nicotine dependence, cigarettes, uncomplicated: Secondary | ICD-10-CM | POA: Diagnosis not present

## 2019-10-27 DIAGNOSIS — Z79899 Other long term (current) drug therapy: Secondary | ICD-10-CM | POA: Diagnosis not present

## 2019-10-27 DIAGNOSIS — E1140 Type 2 diabetes mellitus with diabetic neuropathy, unspecified: Secondary | ICD-10-CM | POA: Diagnosis not present

## 2019-10-27 DIAGNOSIS — M545 Low back pain: Secondary | ICD-10-CM | POA: Diagnosis not present

## 2019-10-27 DIAGNOSIS — E1165 Type 2 diabetes mellitus with hyperglycemia: Secondary | ICD-10-CM | POA: Diagnosis not present

## 2019-11-14 DIAGNOSIS — M79675 Pain in left toe(s): Secondary | ICD-10-CM | POA: Diagnosis not present

## 2019-11-14 DIAGNOSIS — E1142 Type 2 diabetes mellitus with diabetic polyneuropathy: Secondary | ICD-10-CM | POA: Diagnosis not present

## 2019-11-14 DIAGNOSIS — M79674 Pain in right toe(s): Secondary | ICD-10-CM | POA: Diagnosis not present

## 2019-11-14 DIAGNOSIS — B351 Tinea unguium: Secondary | ICD-10-CM | POA: Diagnosis not present

## 2019-11-24 DIAGNOSIS — F17210 Nicotine dependence, cigarettes, uncomplicated: Secondary | ICD-10-CM | POA: Diagnosis not present

## 2019-11-24 DIAGNOSIS — Z79899 Other long term (current) drug therapy: Secondary | ICD-10-CM | POA: Diagnosis not present

## 2019-11-24 DIAGNOSIS — E1165 Type 2 diabetes mellitus with hyperglycemia: Secondary | ICD-10-CM | POA: Diagnosis not present

## 2019-11-24 DIAGNOSIS — E1140 Type 2 diabetes mellitus with diabetic neuropathy, unspecified: Secondary | ICD-10-CM | POA: Diagnosis not present

## 2019-11-24 DIAGNOSIS — M545 Low back pain: Secondary | ICD-10-CM | POA: Diagnosis not present

## 2019-11-25 DIAGNOSIS — E1142 Type 2 diabetes mellitus with diabetic polyneuropathy: Secondary | ICD-10-CM | POA: Diagnosis not present

## 2019-11-25 DIAGNOSIS — J449 Chronic obstructive pulmonary disease, unspecified: Secondary | ICD-10-CM | POA: Diagnosis not present

## 2019-11-25 DIAGNOSIS — F17210 Nicotine dependence, cigarettes, uncomplicated: Secondary | ICD-10-CM | POA: Diagnosis not present

## 2019-12-15 DIAGNOSIS — E1129 Type 2 diabetes mellitus with other diabetic kidney complication: Secondary | ICD-10-CM | POA: Diagnosis not present

## 2019-12-15 DIAGNOSIS — E559 Vitamin D deficiency, unspecified: Secondary | ICD-10-CM | POA: Diagnosis not present

## 2019-12-15 DIAGNOSIS — R5383 Other fatigue: Secondary | ICD-10-CM | POA: Diagnosis not present

## 2019-12-15 DIAGNOSIS — Z20822 Contact with and (suspected) exposure to covid-19: Secondary | ICD-10-CM | POA: Diagnosis not present

## 2019-12-15 DIAGNOSIS — E7800 Pure hypercholesterolemia, unspecified: Secondary | ICD-10-CM | POA: Diagnosis not present

## 2019-12-15 DIAGNOSIS — R809 Proteinuria, unspecified: Secondary | ICD-10-CM | POA: Diagnosis not present

## 2019-12-15 DIAGNOSIS — M129 Arthropathy, unspecified: Secondary | ICD-10-CM | POA: Diagnosis not present

## 2019-12-15 DIAGNOSIS — F17210 Nicotine dependence, cigarettes, uncomplicated: Secondary | ICD-10-CM | POA: Diagnosis not present

## 2019-12-15 DIAGNOSIS — I10 Essential (primary) hypertension: Secondary | ICD-10-CM | POA: Diagnosis not present

## 2019-12-23 DIAGNOSIS — M25552 Pain in left hip: Secondary | ICD-10-CM | POA: Diagnosis not present

## 2019-12-23 DIAGNOSIS — Z79899 Other long term (current) drug therapy: Secondary | ICD-10-CM | POA: Diagnosis not present

## 2019-12-23 DIAGNOSIS — F17210 Nicotine dependence, cigarettes, uncomplicated: Secondary | ICD-10-CM | POA: Diagnosis not present

## 2019-12-23 DIAGNOSIS — R200 Anesthesia of skin: Secondary | ICD-10-CM | POA: Diagnosis not present

## 2019-12-23 DIAGNOSIS — M25561 Pain in right knee: Secondary | ICD-10-CM | POA: Diagnosis not present

## 2019-12-23 DIAGNOSIS — E1140 Type 2 diabetes mellitus with diabetic neuropathy, unspecified: Secondary | ICD-10-CM | POA: Diagnosis not present

## 2019-12-23 DIAGNOSIS — M545 Low back pain: Secondary | ICD-10-CM | POA: Diagnosis not present

## 2020-01-20 DIAGNOSIS — E1165 Type 2 diabetes mellitus with hyperglycemia: Secondary | ICD-10-CM | POA: Diagnosis not present

## 2020-01-20 DIAGNOSIS — E1140 Type 2 diabetes mellitus with diabetic neuropathy, unspecified: Secondary | ICD-10-CM | POA: Diagnosis not present

## 2020-01-20 DIAGNOSIS — R200 Anesthesia of skin: Secondary | ICD-10-CM | POA: Diagnosis not present

## 2020-01-20 DIAGNOSIS — F17210 Nicotine dependence, cigarettes, uncomplicated: Secondary | ICD-10-CM | POA: Diagnosis not present

## 2020-01-20 DIAGNOSIS — Z79899 Other long term (current) drug therapy: Secondary | ICD-10-CM | POA: Diagnosis not present

## 2020-01-20 DIAGNOSIS — M25561 Pain in right knee: Secondary | ICD-10-CM | POA: Diagnosis not present

## 2020-01-20 DIAGNOSIS — M545 Low back pain: Secondary | ICD-10-CM | POA: Diagnosis not present

## 2020-02-08 DIAGNOSIS — M545 Low back pain: Secondary | ICD-10-CM | POA: Diagnosis not present

## 2020-02-08 DIAGNOSIS — M9904 Segmental and somatic dysfunction of sacral region: Secondary | ICD-10-CM | POA: Diagnosis not present

## 2020-02-08 DIAGNOSIS — M9903 Segmental and somatic dysfunction of lumbar region: Secondary | ICD-10-CM | POA: Diagnosis not present

## 2020-02-08 DIAGNOSIS — M9905 Segmental and somatic dysfunction of pelvic region: Secondary | ICD-10-CM | POA: Diagnosis not present

## 2020-02-20 DIAGNOSIS — M25552 Pain in left hip: Secondary | ICD-10-CM | POA: Diagnosis not present

## 2020-02-20 DIAGNOSIS — M25561 Pain in right knee: Secondary | ICD-10-CM | POA: Diagnosis not present

## 2020-02-20 DIAGNOSIS — Z79899 Other long term (current) drug therapy: Secondary | ICD-10-CM | POA: Diagnosis not present

## 2020-02-20 DIAGNOSIS — F17210 Nicotine dependence, cigarettes, uncomplicated: Secondary | ICD-10-CM | POA: Diagnosis not present

## 2020-02-20 DIAGNOSIS — E1165 Type 2 diabetes mellitus with hyperglycemia: Secondary | ICD-10-CM | POA: Diagnosis not present

## 2020-02-20 DIAGNOSIS — R200 Anesthesia of skin: Secondary | ICD-10-CM | POA: Diagnosis not present

## 2020-02-20 DIAGNOSIS — E1140 Type 2 diabetes mellitus with diabetic neuropathy, unspecified: Secondary | ICD-10-CM | POA: Diagnosis not present

## 2020-02-20 DIAGNOSIS — M545 Low back pain: Secondary | ICD-10-CM | POA: Diagnosis not present

## 2020-02-27 DIAGNOSIS — M545 Low back pain: Secondary | ICD-10-CM | POA: Diagnosis not present

## 2020-02-27 DIAGNOSIS — M9905 Segmental and somatic dysfunction of pelvic region: Secondary | ICD-10-CM | POA: Diagnosis not present

## 2020-02-27 DIAGNOSIS — M9903 Segmental and somatic dysfunction of lumbar region: Secondary | ICD-10-CM | POA: Diagnosis not present

## 2020-02-27 DIAGNOSIS — M9904 Segmental and somatic dysfunction of sacral region: Secondary | ICD-10-CM | POA: Diagnosis not present

## 2020-03-16 DIAGNOSIS — M545 Low back pain: Secondary | ICD-10-CM | POA: Diagnosis not present

## 2020-03-16 DIAGNOSIS — M25561 Pain in right knee: Secondary | ICD-10-CM | POA: Diagnosis not present

## 2020-03-16 DIAGNOSIS — M25552 Pain in left hip: Secondary | ICD-10-CM | POA: Diagnosis not present

## 2020-03-16 DIAGNOSIS — R809 Proteinuria, unspecified: Secondary | ICD-10-CM | POA: Diagnosis not present

## 2020-03-16 DIAGNOSIS — R202 Paresthesia of skin: Secondary | ICD-10-CM | POA: Diagnosis not present

## 2020-03-16 DIAGNOSIS — E1129 Type 2 diabetes mellitus with other diabetic kidney complication: Secondary | ICD-10-CM | POA: Diagnosis not present

## 2020-03-16 DIAGNOSIS — Z79899 Other long term (current) drug therapy: Secondary | ICD-10-CM | POA: Diagnosis not present

## 2020-03-16 DIAGNOSIS — Z9181 History of falling: Secondary | ICD-10-CM | POA: Diagnosis not present

## 2020-03-16 DIAGNOSIS — F17210 Nicotine dependence, cigarettes, uncomplicated: Secondary | ICD-10-CM | POA: Diagnosis not present

## 2020-04-13 DIAGNOSIS — R809 Proteinuria, unspecified: Secondary | ICD-10-CM | POA: Diagnosis not present

## 2020-04-13 DIAGNOSIS — R202 Paresthesia of skin: Secondary | ICD-10-CM | POA: Diagnosis not present

## 2020-04-13 DIAGNOSIS — E1129 Type 2 diabetes mellitus with other diabetic kidney complication: Secondary | ICD-10-CM | POA: Diagnosis not present

## 2020-04-13 DIAGNOSIS — M25561 Pain in right knee: Secondary | ICD-10-CM | POA: Diagnosis not present

## 2020-04-13 DIAGNOSIS — Z9181 History of falling: Secondary | ICD-10-CM | POA: Diagnosis not present

## 2020-04-13 DIAGNOSIS — F17210 Nicotine dependence, cigarettes, uncomplicated: Secondary | ICD-10-CM | POA: Diagnosis not present

## 2020-04-13 DIAGNOSIS — Z79899 Other long term (current) drug therapy: Secondary | ICD-10-CM | POA: Diagnosis not present

## 2020-04-13 DIAGNOSIS — M25552 Pain in left hip: Secondary | ICD-10-CM | POA: Diagnosis not present

## 2020-05-28 DIAGNOSIS — Z87891 Personal history of nicotine dependence: Secondary | ICD-10-CM | POA: Diagnosis not present

## 2020-05-28 DIAGNOSIS — Z6839 Body mass index (BMI) 39.0-39.9, adult: Secondary | ICD-10-CM | POA: Diagnosis not present

## 2020-05-28 DIAGNOSIS — J449 Chronic obstructive pulmonary disease, unspecified: Secondary | ICD-10-CM | POA: Diagnosis not present

## 2020-05-28 DIAGNOSIS — F17210 Nicotine dependence, cigarettes, uncomplicated: Secondary | ICD-10-CM | POA: Diagnosis not present

## 2020-07-31 DIAGNOSIS — Z9181 History of falling: Secondary | ICD-10-CM | POA: Diagnosis not present

## 2020-07-31 DIAGNOSIS — R809 Proteinuria, unspecified: Secondary | ICD-10-CM | POA: Diagnosis not present

## 2020-07-31 DIAGNOSIS — I10 Essential (primary) hypertension: Secondary | ICD-10-CM | POA: Diagnosis not present

## 2020-07-31 DIAGNOSIS — M545 Low back pain, unspecified: Secondary | ICD-10-CM | POA: Diagnosis not present

## 2020-07-31 DIAGNOSIS — R202 Paresthesia of skin: Secondary | ICD-10-CM | POA: Diagnosis not present

## 2020-07-31 DIAGNOSIS — E1129 Type 2 diabetes mellitus with other diabetic kidney complication: Secondary | ICD-10-CM | POA: Diagnosis not present

## 2020-07-31 DIAGNOSIS — F17210 Nicotine dependence, cigarettes, uncomplicated: Secondary | ICD-10-CM | POA: Diagnosis not present

## 2020-07-31 DIAGNOSIS — E782 Mixed hyperlipidemia: Secondary | ICD-10-CM | POA: Diagnosis not present

## 2020-07-31 DIAGNOSIS — M129 Arthropathy, unspecified: Secondary | ICD-10-CM | POA: Diagnosis not present

## 2020-07-31 DIAGNOSIS — Z79899 Other long term (current) drug therapy: Secondary | ICD-10-CM | POA: Diagnosis not present

## 2020-07-31 DIAGNOSIS — Z6841 Body Mass Index (BMI) 40.0 and over, adult: Secondary | ICD-10-CM | POA: Diagnosis not present

## 2020-07-31 DIAGNOSIS — E559 Vitamin D deficiency, unspecified: Secondary | ICD-10-CM | POA: Diagnosis not present

## 2020-07-31 DIAGNOSIS — M25552 Pain in left hip: Secondary | ICD-10-CM | POA: Diagnosis not present

## 2020-07-31 DIAGNOSIS — Z1159 Encounter for screening for other viral diseases: Secondary | ICD-10-CM | POA: Diagnosis not present

## 2020-10-03 DIAGNOSIS — E1129 Type 2 diabetes mellitus with other diabetic kidney complication: Secondary | ICD-10-CM | POA: Diagnosis not present

## 2020-10-03 DIAGNOSIS — M25552 Pain in left hip: Secondary | ICD-10-CM | POA: Diagnosis not present

## 2020-10-03 DIAGNOSIS — R202 Paresthesia of skin: Secondary | ICD-10-CM | POA: Diagnosis not present

## 2020-10-03 DIAGNOSIS — R809 Proteinuria, unspecified: Secondary | ICD-10-CM | POA: Diagnosis not present

## 2020-10-03 DIAGNOSIS — F17210 Nicotine dependence, cigarettes, uncomplicated: Secondary | ICD-10-CM | POA: Diagnosis not present

## 2020-10-03 DIAGNOSIS — M25512 Pain in left shoulder: Secondary | ICD-10-CM | POA: Diagnosis not present

## 2020-10-03 DIAGNOSIS — I10 Essential (primary) hypertension: Secondary | ICD-10-CM | POA: Diagnosis not present

## 2020-10-03 DIAGNOSIS — Z6841 Body Mass Index (BMI) 40.0 and over, adult: Secondary | ICD-10-CM | POA: Diagnosis not present

## 2020-10-03 DIAGNOSIS — Z79899 Other long term (current) drug therapy: Secondary | ICD-10-CM | POA: Diagnosis not present

## 2020-10-03 DIAGNOSIS — Z9181 History of falling: Secondary | ICD-10-CM | POA: Diagnosis not present

## 2020-10-03 DIAGNOSIS — M545 Low back pain, unspecified: Secondary | ICD-10-CM | POA: Diagnosis not present

## 2021-10-16 LAB — COLOGUARD: COLOGUARD: POSITIVE — AB

## 2021-12-23 ENCOUNTER — Encounter (HOSPITAL_COMMUNITY): Payer: Self-pay | Admitting: Emergency Medicine

## 2021-12-23 ENCOUNTER — Emergency Department (HOSPITAL_COMMUNITY): Payer: PPO

## 2021-12-23 ENCOUNTER — Other Ambulatory Visit: Payer: Self-pay

## 2021-12-23 ENCOUNTER — Inpatient Hospital Stay (HOSPITAL_COMMUNITY)
Admission: EM | Admit: 2021-12-23 | Discharge: 2021-12-26 | DRG: 557 | Disposition: A | Discharge status: Home / Self Care | Payer: PPO | Attending: Internal Medicine | Admitting: Internal Medicine

## 2021-12-23 DIAGNOSIS — R001 Bradycardia, unspecified: Secondary | ICD-10-CM | POA: Diagnosis not present

## 2021-12-23 DIAGNOSIS — E870 Hyperosmolality and hypernatremia: Secondary | ICD-10-CM | POA: Diagnosis present

## 2021-12-23 DIAGNOSIS — R7989 Other specified abnormal findings of blood chemistry: Secondary | ICD-10-CM | POA: Diagnosis present

## 2021-12-23 DIAGNOSIS — Z885 Allergy status to narcotic agent status: Secondary | ICD-10-CM

## 2021-12-23 DIAGNOSIS — E861 Hypovolemia: Secondary | ICD-10-CM | POA: Diagnosis present

## 2021-12-23 DIAGNOSIS — E119 Type 2 diabetes mellitus without complications: Secondary | ICD-10-CM

## 2021-12-23 DIAGNOSIS — E669 Obesity, unspecified: Secondary | ICD-10-CM | POA: Diagnosis present

## 2021-12-23 DIAGNOSIS — R778 Other specified abnormalities of plasma proteins: Secondary | ICD-10-CM | POA: Diagnosis not present

## 2021-12-23 DIAGNOSIS — M1990 Unspecified osteoarthritis, unspecified site: Secondary | ICD-10-CM | POA: Diagnosis present

## 2021-12-23 DIAGNOSIS — K219 Gastro-esophageal reflux disease without esophagitis: Secondary | ICD-10-CM | POA: Diagnosis present

## 2021-12-23 DIAGNOSIS — R269 Unspecified abnormalities of gait and mobility: Secondary | ICD-10-CM | POA: Diagnosis present

## 2021-12-23 DIAGNOSIS — G9389 Other specified disorders of brain: Secondary | ICD-10-CM | POA: Diagnosis present

## 2021-12-23 DIAGNOSIS — J9600 Acute respiratory failure, unspecified whether with hypoxia or hypercapnia: Secondary | ICD-10-CM | POA: Diagnosis present

## 2021-12-23 DIAGNOSIS — Z79899 Other long term (current) drug therapy: Secondary | ICD-10-CM

## 2021-12-23 DIAGNOSIS — Z823 Family history of stroke: Secondary | ICD-10-CM

## 2021-12-23 DIAGNOSIS — R296 Repeated falls: Secondary | ICD-10-CM | POA: Diagnosis present

## 2021-12-23 DIAGNOSIS — Z8249 Family history of ischemic heart disease and other diseases of the circulatory system: Secondary | ICD-10-CM

## 2021-12-23 DIAGNOSIS — J449 Chronic obstructive pulmonary disease, unspecified: Secondary | ICD-10-CM | POA: Diagnosis present

## 2021-12-23 DIAGNOSIS — N179 Acute kidney failure, unspecified: Secondary | ICD-10-CM | POA: Diagnosis present

## 2021-12-23 DIAGNOSIS — R0902 Hypoxemia: Secondary | ICD-10-CM | POA: Diagnosis present

## 2021-12-23 DIAGNOSIS — I248 Other forms of acute ischemic heart disease: Secondary | ICD-10-CM | POA: Diagnosis present

## 2021-12-23 DIAGNOSIS — I10 Essential (primary) hypertension: Secondary | ICD-10-CM | POA: Diagnosis present

## 2021-12-23 DIAGNOSIS — F17210 Nicotine dependence, cigarettes, uncomplicated: Secondary | ICD-10-CM | POA: Diagnosis present

## 2021-12-23 DIAGNOSIS — R7303 Prediabetes: Secondary | ICD-10-CM | POA: Diagnosis present

## 2021-12-23 DIAGNOSIS — Z6839 Body mass index (BMI) 39.0-39.9, adult: Secondary | ICD-10-CM

## 2021-12-23 DIAGNOSIS — E860 Dehydration: Secondary | ICD-10-CM | POA: Diagnosis present

## 2021-12-23 DIAGNOSIS — G9341 Metabolic encephalopathy: Secondary | ICD-10-CM | POA: Diagnosis present

## 2021-12-23 DIAGNOSIS — R2689 Other abnormalities of gait and mobility: Secondary | ICD-10-CM | POA: Diagnosis present

## 2021-12-23 DIAGNOSIS — Z720 Tobacco use: Secondary | ICD-10-CM | POA: Diagnosis present

## 2021-12-23 DIAGNOSIS — M6282 Rhabdomyolysis: Secondary | ICD-10-CM | POA: Diagnosis present

## 2021-12-23 DIAGNOSIS — R4182 Altered mental status, unspecified: Principal | ICD-10-CM | POA: Diagnosis present

## 2021-12-23 DIAGNOSIS — J9601 Acute respiratory failure with hypoxia: Secondary | ICD-10-CM

## 2021-12-23 DIAGNOSIS — Z7982 Long term (current) use of aspirin: Secondary | ICD-10-CM

## 2021-12-23 DIAGNOSIS — N390 Urinary tract infection, site not specified: Secondary | ICD-10-CM | POA: Diagnosis present

## 2021-12-23 DIAGNOSIS — Z7984 Long term (current) use of oral hypoglycemic drugs: Secondary | ICD-10-CM

## 2021-12-23 DIAGNOSIS — I69098 Other sequelae following nontraumatic subarachnoid hemorrhage: Secondary | ICD-10-CM

## 2021-12-23 DIAGNOSIS — L89316 Pressure-induced deep tissue damage of right buttock: Secondary | ICD-10-CM | POA: Diagnosis present

## 2021-12-23 DIAGNOSIS — Z86711 Personal history of pulmonary embolism: Secondary | ICD-10-CM

## 2021-12-23 DIAGNOSIS — Z86718 Personal history of other venous thrombosis and embolism: Secondary | ICD-10-CM

## 2021-12-23 DIAGNOSIS — R4020 Unspecified coma: Secondary | ICD-10-CM | POA: Diagnosis not present

## 2021-12-23 DIAGNOSIS — R9431 Abnormal electrocardiogram [ECG] [EKG]: Secondary | ICD-10-CM | POA: Diagnosis not present

## 2021-12-23 LAB — BLOOD GAS, VENOUS
Acid-Base Excess: 1.2 mmol/L (ref 0.0–2.0)
Bicarbonate: 27.6 mmol/L (ref 20.0–28.0)
O2 Saturation: 85.4 %
Patient temperature: 37
pCO2, Ven: 50 mmHg (ref 44–60)
pH, Ven: 7.35 (ref 7.25–7.43)
pO2, Ven: 51 mmHg — ABNORMAL HIGH (ref 32–45)

## 2021-12-23 LAB — CK
Total CK: 8691 U/L — ABNORMAL HIGH (ref 49–397)
Total CK: 9562 U/L — ABNORMAL HIGH (ref 49–397)

## 2021-12-23 LAB — HEMOGLOBIN A1C
Hgb A1c MFr Bld: 6.2 % — ABNORMAL HIGH (ref 4.8–5.6)
Mean Plasma Glucose: 131.24 mg/dL

## 2021-12-23 LAB — URINALYSIS, ROUTINE W REFLEX MICROSCOPIC
Bilirubin Urine: NEGATIVE
Glucose, UA: >=500 mg/dL — AB
Ketones, ur: 5 mg/dL — AB
Nitrite: NEGATIVE
Protein, ur: 100 mg/dL — AB
Specific Gravity, Urine: 1.017 (ref 1.005–1.030)
pH: 5 (ref 5.0–8.0)

## 2021-12-23 LAB — SODIUM, URINE, RANDOM: Sodium, Ur: 57 mmol/L

## 2021-12-23 LAB — CBC WITH DIFFERENTIAL/PLATELET
Abs Immature Granulocytes: 0.06 10*3/uL (ref 0.00–0.07)
Basophils Absolute: 0.1 10*3/uL (ref 0.0–0.1)
Basophils Relative: 0 %
Eosinophils Absolute: 0 10*3/uL (ref 0.0–0.5)
Eosinophils Relative: 0 %
HCT: 61.8 % — ABNORMAL HIGH (ref 39.0–52.0)
Hemoglobin: 20.5 g/dL — ABNORMAL HIGH (ref 13.0–17.0)
Immature Granulocytes: 0 %
Lymphocytes Relative: 5 %
Lymphs Abs: 0.7 10*3/uL (ref 0.7–4.0)
MCH: 33.6 pg (ref 26.0–34.0)
MCHC: 33.2 g/dL (ref 30.0–36.0)
MCV: 101.1 fL — ABNORMAL HIGH (ref 80.0–100.0)
Monocytes Absolute: 1.6 10*3/uL — ABNORMAL HIGH (ref 0.1–1.0)
Monocytes Relative: 12 %
Neutro Abs: 11.1 10*3/uL — ABNORMAL HIGH (ref 1.7–7.7)
Neutrophils Relative %: 83 %
Platelets: 141 10*3/uL — ABNORMAL LOW (ref 150–400)
RBC: 6.11 MIL/uL — ABNORMAL HIGH (ref 4.22–5.81)
RDW: 13.6 % (ref 11.5–15.5)
WBC: 13.5 10*3/uL — ABNORMAL HIGH (ref 4.0–10.5)
nRBC: 0 % (ref 0.0–0.2)

## 2021-12-23 LAB — COMPREHENSIVE METABOLIC PANEL
ALT: 60 U/L — ABNORMAL HIGH (ref 0–44)
AST: 161 U/L — ABNORMAL HIGH (ref 15–41)
Albumin: 4.1 g/dL (ref 3.5–5.0)
Alkaline Phosphatase: 91 U/L (ref 38–126)
Anion gap: 13 (ref 5–15)
BUN: 80 mg/dL — ABNORMAL HIGH (ref 8–23)
CO2: 23 mmol/L (ref 22–32)
Calcium: 9.7 mg/dL (ref 8.9–10.3)
Chloride: 114 mmol/L — ABNORMAL HIGH (ref 98–111)
Creatinine, Ser: 2.27 mg/dL — ABNORMAL HIGH (ref 0.61–1.24)
GFR, Estimated: 32 mL/min — ABNORMAL LOW (ref >60)
Glucose, Bld: 164 mg/dL — ABNORMAL HIGH (ref 70–99)
Potassium: 4.3 mmol/L (ref 3.5–5.1)
Sodium: 150 mmol/L — ABNORMAL HIGH (ref 135–145)
Total Bilirubin: 2.3 mg/dL — ABNORMAL HIGH (ref 0.3–1.2)
Total Protein: 7.4 g/dL (ref 6.5–8.1)

## 2021-12-23 LAB — CREATININE, URINE, RANDOM: Creatinine, Urine: 194.02 mg/dL

## 2021-12-23 LAB — BASIC METABOLIC PANEL
Anion gap: 6 (ref 5–15)
BUN: 70 mg/dL — ABNORMAL HIGH (ref 8–23)
CO2: 25 mmol/L (ref 22–32)
Calcium: 8 mg/dL — ABNORMAL LOW (ref 8.9–10.3)
Chloride: 119 mmol/L — ABNORMAL HIGH (ref 98–111)
Creatinine, Ser: 1.78 mg/dL — ABNORMAL HIGH (ref 0.61–1.24)
GFR, Estimated: 43 mL/min — ABNORMAL LOW (ref >60)
Glucose, Bld: 117 mg/dL — ABNORMAL HIGH (ref 70–99)
Potassium: 4.2 mmol/L (ref 3.5–5.1)
Sodium: 150 mmol/L — ABNORMAL HIGH (ref 135–145)

## 2021-12-23 LAB — RAPID URINE DRUG SCREEN, HOSP PERFORMED
Amphetamines: NOT DETECTED
Barbiturates: NOT DETECTED
Benzodiazepines: NOT DETECTED
Cocaine: NOT DETECTED
Opiates: NOT DETECTED
Tetrahydrocannabinol: NOT DETECTED

## 2021-12-23 LAB — LACTIC ACID, PLASMA: Lactic Acid, Venous: 1.4 mmol/L (ref 0.5–1.9)

## 2021-12-23 LAB — AMMONIA: Ammonia: 37 umol/L — ABNORMAL HIGH (ref 9–35)

## 2021-12-23 LAB — PROTIME-INR
INR: 1.1 (ref 0.8–1.2)
Prothrombin Time: 14.4 seconds (ref 11.4–15.2)

## 2021-12-23 LAB — ETHANOL: Alcohol, Ethyl (B): <10 mg/dL (ref <10)

## 2021-12-23 LAB — OSMOLALITY, URINE: Osmolality, Ur: 623 mOsm/kg (ref 300–900)

## 2021-12-23 LAB — PHOSPHORUS: Phosphorus: 2.7 mg/dL (ref 2.5–4.6)

## 2021-12-23 LAB — MAGNESIUM: Magnesium: 3.3 mg/dL — ABNORMAL HIGH (ref 1.7–2.4)

## 2021-12-23 LAB — TROPONIN I (HIGH SENSITIVITY): Troponin I (High Sensitivity): 39 ng/L — ABNORMAL HIGH (ref <18)

## 2021-12-23 LAB — TSH: TSH: 0.591 u[IU]/mL (ref 0.350–4.500)

## 2021-12-23 LAB — OSMOLALITY: Osmolality: 341 mOsm/kg (ref 275–295)

## 2021-12-23 LAB — CBG MONITORING, ED: Glucose-Capillary: 122 mg/dL — ABNORMAL HIGH (ref 70–99)

## 2021-12-23 MED ORDER — CHLORHEXIDINE GLUCONATE CLOTH 2 % EX PADS
6.0000 | MEDICATED_PAD | Freq: Every day | CUTANEOUS | Status: DC
  Administered 2021-12-24 – 2021-12-25 (×2): 6 via TOPICAL

## 2021-12-23 MED ORDER — STERILE WATER FOR INJECTION IV SOLN
INTRAVENOUS | Status: DC
  Filled 2021-12-23 (×6): qty 150
  Filled 2021-12-23: qty 1000
  Filled 2021-12-23 (×2): qty 150
  Filled 2021-12-23 (×2): qty 1000

## 2021-12-23 MED ORDER — SODIUM CHLORIDE 0.9 % IV BOLUS
1000.0000 mL | Freq: Once | INTRAVENOUS | Status: AC
  Administered 2021-12-23: 1000 mL via INTRAVENOUS

## 2021-12-23 MED ORDER — ORAL CARE MOUTH RINSE
15.0000 mL | Freq: Two times a day (BID) | OROMUCOSAL | Status: DC
  Administered 2021-12-24 – 2021-12-26 (×6): 15 mL via OROMUCOSAL

## 2021-12-23 MED ORDER — ACETAMINOPHEN 325 MG PO TABS
650.0000 mg | ORAL_TABLET | Freq: Four times a day (QID) | ORAL | Status: DC | PRN
  Administered 2021-12-24 – 2021-12-26 (×2): 650 mg via ORAL
  Filled 2021-12-23 (×2): qty 2

## 2021-12-23 MED ORDER — SODIUM CHLORIDE 0.9 % IV SOLN
2.0000 g | INTRAVENOUS | Status: DC
  Administered 2021-12-23 – 2021-12-25 (×3): 2 g via INTRAVENOUS
  Filled 2021-12-23 (×4): qty 20

## 2021-12-23 MED ORDER — LACTATED RINGERS IV BOLUS
1000.0000 mL | Freq: Once | INTRAVENOUS | Status: AC
  Administered 2021-12-23: 1000 mL via INTRAVENOUS

## 2021-12-23 MED ORDER — ACETAMINOPHEN 650 MG RE SUPP: 650.0000 mg | Freq: Four times a day (QID) | RECTAL | Status: DC | PRN

## 2021-12-23 MED ORDER — ALBUTEROL SULFATE (2.5 MG/3ML) 0.083% IN NEBU: 2.5000 mg | INHALATION_SOLUTION | RESPIRATORY_TRACT | Status: DC | PRN

## 2021-12-23 MED ORDER — INSULIN ASPART 100 UNIT/ML IJ SOLN
0.0000 [IU] | INTRAMUSCULAR | Status: DC
  Administered 2021-12-24 – 2021-12-25 (×7): 1 [IU] via SUBCUTANEOUS
  Filled 2021-12-23: qty 0.09

## 2021-12-23 NOTE — ED Triage Notes (Addendum)
Pt BIB EMS from home. Welfare check initiated, found pt on the floor for unknown amount of time. Generalized weakness, unable to bear weight. EMS reported hoarding situation upon arrival. Pt endorsed never throwing away anything d/t lack of money. Alert and oriented x2-3, intermittent confusion. 22 left hand, 300 mL fluids received.   BP 140/73 P 102 spO2 91% on 4 liters CBG 172

## 2021-12-23 NOTE — Assessment & Plan Note (Signed)
-   treat with Rocephin         await results of urine culture and adjust antibiotic coverage as needed  

## 2021-12-23 NOTE — ED Notes (Signed)
Pt placed on 2L nasal cannula due to Spo2 staying around 89%

## 2021-12-23 NOTE — Assessment & Plan Note (Signed)
In the setting of severe dehydration will rehydrate and follow

## 2021-12-23 NOTE — Assessment & Plan Note (Signed)
In the setting of severe dehydration will rehydrate for mental status if does not improve may need further imaging including MRI Evaluate for any source of Patient obtain VBG  ammonia

## 2021-12-23 NOTE — Assessment & Plan Note (Addendum)
Initial chest x-ray unremarkable No chest pain Rehydrate and continue to follow patient is a smoker Possibly undiagnosed COPD Is unclear if patient at baseline may have some low-grade hypoxia he was aware of he does not go to doctors very often History of DVT in the past no longer on anticoagulation Unable to obtain CTA given AKI. If D-dimer is positive may need CTA when renal function improves versus VQ scan

## 2021-12-23 NOTE — ED Provider Notes (Signed)
[facility] DEPT Provider Note   CSN: [id number] Arrival date & time: 12/23/21  1456     History  Chief Complaint  Patient presents with   Altered Mental Status    Dennis Wyatt is a 61 y.o. male.   Altered Mental Status Patient is confused.  Found after welfare check.  Reportedly family had not heard from him since Friday with today being Monday.  Confused somewhat about events.  Found on floor.  Potentially had fallen.  Patient really not complaining of any pain at this time.  Is here with family member but is somewhat confused.  Reportedly at baseline gets around with 2 walking sticks.    Past Medical History:  Diagnosis Date   Arthritis    GERD (gastroesophageal reflux disease)    Headache    Hypertension    Stroke ([place]) 11/2012   Tooth pain    uses BC powders for teeth pain    Home Medications Prior to Admission medications   Medication Sig Start Date End Date Taking? Authorizing Provider  aspirin 325 MG tablet Take 325 mg by mouth daily.   Yes [provider]  Dulaglutide (TRULICITY) [id number] SOPN Inject 0.75 mg into the skin once a week.   Yes [provider]  gabapentin (NEURONTIN) 600 MG tablet Take 600 mg by mouth 2 (two) times daily. 09/12/21  Yes [provider]  lisinopril-hydrochlorothiazide (ZESTORETIC) 10-12.5 MG tablet Take 1 tablet by mouth daily. 09/12/21  Yes [provider]  metoprolol succinate (TOPROL-XL) 50 MG 24 hr tablet Take 50 mg by mouth daily. 09/12/21  Yes [provider]  traZODone (DESYREL) 100 MG tablet Take 200 mg by mouth at bedtime as needed. 11/28/21  Yes [provider]      Allergies    Oxycodone    Review of Systems   Review of Systems  Physical Exam Updated Vital Signs BP (!) 125/56   Pulse 85   Temp (!) 97.3 F (36.3 C) (Oral)   Resp 17   SpO2 95%  Physical Exam Vitals reviewed.  HENT:     Head: Normocephalic.  Eyes:      Extraocular Movements: Extraocular movements intact.     Comments: Some conjunctival injection bilaterally.  Some crusty drainage bilateral ears chronic for patient.  Cardiovascular:     Rate and Rhythm: Tachycardia present.  Pulmonary:     Breath sounds: No wheezing or rhonchi.     Comments: Mildly harsh breath sounds. Abdominal:     Tenderness: There is no abdominal tenderness.  Musculoskeletal:        General: No tenderness.     Cervical back: Neck supple.  Skin:    General: Skin is warm.  Neurological:     Mental Status: He is alert.     Comments: Awake and pleasant but some confusion.  Joking but confused to events and what happened.     ED Results / Procedures / Treatments   Labs (all labs ordered are listed, but only abnormal results are displayed) Labs Reviewed  CK - Abnormal; Notable for the following components:      Result Value   Total CK 8,691 (*)    All other components within normal limits  COMPREHENSIVE METABOLIC PANEL - Abnormal; Notable for the following components:   Sodium 150 (*)    Chloride 114 (*)    Glucose, Bld 164 (*)    BUN 80 (*)    Creatinine, Ser 2.27 (*)  AST 161 (*)    ALT 60 (*)    Total Bilirubin 2.3 (*)    GFR, Estimated 32 (*)    All other components within normal limits  CBC WITH DIFFERENTIAL/PLATELET - Abnormal; Notable for the following components:   WBC 13.5 (*)    RBC 6.11 (*)    Hemoglobin 20.5 (*)    HCT 61.8 (*)    MCV 101.1 (*)    Platelets 141 (*)    Neutro Abs 11.1 (*)    Monocytes Absolute 1.6 (*)    All other components within normal limits  URINALYSIS, ROUTINE W REFLEX MICROSCOPIC - Abnormal; Notable for the following components:   Color, Urine AMBER (*)    APPearance CLOUDY (*)    Glucose, UA >=500 (*)    Hgb urine dipstick LARGE (*)    Ketones, ur 5 (*)    Protein, ur 100 (*)    Leukocytes,Ua TRACE (*)    Bacteria, UA MANY (*)    All other components within normal limits  AMMONIA - Abnormal; Notable for  the following components:   Ammonia 37 (*)    All other components within normal limits  BLOOD GAS, VENOUS - Abnormal; Notable for the following components:   pO2, Ven 51 (*)    All other components within normal limits  MAGNESIUM - Abnormal; Notable for the following components:   Magnesium 3.3 (*)    All other components within normal limits  OSMOLALITY - Abnormal; Notable for the following components:   Osmolality 341 (*)    All other components within normal limits  BASIC METABOLIC PANEL - Abnormal; Notable for the following components:   Sodium 150 (*)    Chloride 119 (*)    Glucose, Bld 117 (*)    BUN 70 (*)    Creatinine, Ser 1.78 (*)    Calcium 8.0 (*)    GFR, Estimated 43 (*)    All other components within normal limits  HEMOGLOBIN A1C - Abnormal; Notable for the following components:   Hgb A1c MFr Bld 6.2 (*)    All other components within normal limits  CK - Abnormal; Notable for the following components:   Total CK 9,562 (*)    All other components within normal limits  CBG MONITORING, ED - Abnormal; Notable for the following components:   Glucose-Capillary 122 (*)    All other components within normal limits  TROPONIN I (HIGH SENSITIVITY) - Abnormal; Notable for the following components:   Troponin I (High Sensitivity) 39 (*)    All other components within normal limits  URINE CULTURE  MRSA NEXT GEN BY PCR, NASAL  ETHANOL  CREATININE, URINE, RANDOM  OSMOLALITY, URINE  PHOSPHORUS  PROTIME-INR  SODIUM, URINE, RANDOM  TSH  RAPID URINE DRUG SCREEN, HOSP PERFORMED  LACTIC ACID, PLASMA  PREALBUMIN  BASIC METABOLIC PANEL  CBC  CK  BASIC METABOLIC PANEL  BASIC METABOLIC PANEL  BASIC METABOLIC PANEL  CK  CK  HIV ANTIBODY (ROUTINE TESTING W REFLEX)  COMPREHENSIVE METABOLIC PANEL  CBC    EKG EKG Interpretation  Date/Time:  Monday December 23 2021 16:11:18 EDT Ventricular Rate:  101 PR Interval:  166 QRS Duration: 120 QT Interval:  336 QTC  Calculation: 436 R Axis:   265 Text Interpretation: Sinus tachycardia Nonspecific IVCD with LAD Inferior infarct, old Anterior infarct, old since last tracing no significant change Confirmed by Daleen Bo [id number]) on 12/23/2021 4:28:55 PM  Radiology CT Head Wo Contrast  Result Date: 12/23/2021 CLINICAL  DATA:  Head trauma, abnormal mental status. Patient found on floor. Generalized weakness. EXAM: CT HEAD WITHOUT CONTRAST TECHNIQUE: Contiguous axial images were obtained from the base of the skull through the vertex without intravenous contrast. RADIATION DOSE REDUCTION: This exam was performed according to the departmental dose-optimization program which includes automated exposure control, adjustment of the mA and/or kV according to patient size and/or use of iterative reconstruction technique. 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 Encephalomalacia of the right greater than left anterior inferior frontal lobes noted. Right-sided changes extend more superiorly. This is consistent with the areas previously noted. Chronic encephalomalacia is present in the more posterior right frontal parietal lobe near the vertex. No acute infarct, hemorrhage, or mass lesion is present. Basal ganglia are intact. Insular ribbon is normal. No acute or focal cortical abnormalities are present. The brainstem and cerebellum are within normal limits. Vascular: Aneurysm coil noted. No hyperdense vessel or other unexpected calcifications. Skull: Calvarium is intact. No focal lytic or blastic lesions are present. No significant extracranial soft tissue lesion is present. Sinuses/Orbits: The paranasal sinuses and mastoid air cells are clear. The globes and orbits are within normal limits. IMPRESSION: 1. No acute intracranial abnormality or significant interval change. 2. Encephalomalacia of the anterior inferior frontal lobes  bilaterally, right greater than left. This is consistent with the areas previously noted. 3. Chronic encephalomalacia in the more posterior right frontal parietal lobe near the vertex. 4. 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 Electronically Signed   By: Marin Robertshristopher  Mattern M.D.   On: 12/23/2021 16:35   DG Chest Portable 1 View  Result Date: 12/23/2021 CLINICAL DATA:  Weakness. Found on floor for unknown amount of time. EXAM: PORTABLE CHEST 1 VIEW COMPARISON:  AP chest 12/13/2012 FINDINGS: Interval removal of right internal jugular central venous catheter compared to prior remote comparison study. Cardiac silhouette and mediastinal contours are within normal limits. Mild bibasilar horizontal linear scarring is similar to prior. Right infrahilar heterogeneous airspace opacification is resolved. No definite focal airspace opacity to indicate pneumonia. No pleural effusion or pneumothorax. No acute skeletal abnormality. IMPRESSION: Mild bibasilar horizontal linear scarring.  No acute lung process. Electronically Signed   By: Neita Garnetonald  Viola M.D.   On: 12/23/2021 16:26    Procedures Procedures    Medications Ordered in ED Medications  sodium bicarbonate 150 mEq in sterile water 1,150 mL infusion ( Intravenous New Bag/Given 12/23/21 1754)  acetaminophen (TYLENOL) tablet 650 mg (has no administration in time range)    Or  acetaminophen (TYLENOL) suppository 650 mg (has no administration in time range)  albuterol (PROVENTIL) (2.5 MG/3ML) 0.083% nebulizer solution 2.5 mg (has no administration in time range)  insulin aspart (novoLOG) injection 0-9 Units ( Subcutaneous Not Given 12/23/21 1957)  cefTRIAXone (ROCEPHIN) 2 g in sodium chloride 0.9 % 100 mL IVPB (0 g Intravenous Stopped 12/23/21 2254)  Chlorhexidine Gluconate Cloth 2 % PADS 6 each (has no administration in time range)  MEDLINE mouth rinse (has no administration in time range)  sodium chloride 0.9 % bolus 1,000 mL (0  mLs Intravenous Stopped 12/23/21 1755)  lactated ringers bolus 1,000 mL (0 mLs Intravenous Stopped 12/23/21 1900)    ED Course/ Medical Decision Making/ A&P                           Medical Decision Making Amount and/or Complexity of Data Reviewed  Labs: ordered. Radiology: ordered.  Risk Decision regarding hospitalization.   Patient found on the floor.  Mental status change.  Unsure what happened.  Mildly hypoxic but does smoke heavily.  Chest x-ray independently interpreted and reassuring.  No pneumonia.  However creatinine is elevated to 2.3.  Had been normal last 1 we have that was around 5 years ago.  However CK of almost 8000.  BUN is 80.  Likely dehydrated with rhabdomyolysis.  Subacute kidney injury secondary to it.  Sodium is elevated 150.  Initial fluid bolus of normal saline being given.  Will require admission to the hospital.  Will discuss with hospitalist and discussed with nephrologist.  Discussed with Dr. Joelyn Oms from nephrology.  Recommended volume resuscitation with lactated Ringer's.  Then start bicarb drip at about 150.  Keep urine output up and keep urine alkalization.  If needed they can be consulted tomorrow but if everything improving does not necessarily need to be involved.  CRITICAL CARE Performed by: Davonna Belling Total critical care time: 30 minutes Critical care time was exclusive of separately billable procedures and treating other patients. Critical care was necessary to treat or prevent imminent or life-threatening deterioration. Critical care was time spent personally by me on the following activities: development of treatment plan with patient and/or surrogate as well as nursing, discussions with consultants, evaluation of patient's response to treatment, examination of patient, obtaining history from patient or surrogate, ordering and performing treatments and interventions, ordering and review of laboratory studies, ordering and review of radiographic  studies, pulse oximetry and re-evaluation of patient's condition.         Final Clinical Impression(s) / ED Diagnoses Final diagnoses:  Altered mental status, unspecified altered mental status type  Non-traumatic rhabdomyolysis  AKI (acute kidney injury) [facility])    Rx / DC Orders ED Discharge Orders     None         Davonna Belling, MD 12/23/21 2353

## 2021-12-23 NOTE — Assessment & Plan Note (Signed)
In the setting of severe dehydration will rehydrate recheck creatinine obtain urine electrolytes if renal function does not improve may need nephrology consult and renal imaging

## 2021-12-23 NOTE — ED Notes (Addendum)
ED TO INPATIENT HANDOFF REPORT  ED Nurse Name and Phone #:  Junior Ramando [phone] Graceann Congress EMT-P [phone]  S Name/Age/Gender Dennis Wyatt 61 y.o. male Room/Bed: WA12/WA12  Code Status   Code Status: Prior  Home/SNF/Other Home Patient oriented to: self, place, time, and situation Is this baseline? Yes   Triage Complete: Triage complete  Chief Complaint Rhabdomyolysis [M62.82]  Triage Note Pt BIB EMS from home. Welfare check initiated, found pt on the floor for unknown amount of time. Generalized weakness, unable to bear weight. EMS reported hoarding situation upon arrival. Pt endorsed never throwing away anything d/t lack of money. Alert and oriented x2-3, intermittent confusion. 22 left hand, 300 mL fluids received.   BP 140/73 P 102 spO2 91% on 4 liters CBG 172    Allergies Allergies  Allergen Reactions   Oxycodone     itch    Level of Care/Admitting Diagnosis ED Disposition     ED Disposition  Admit   Condition  --   Comment  Hospital Area: [place] [[id number]]  Level of Care: Stepdown [14]  Admit to SDU based on following criteria: Hemodynamic compromise or significant risk of instability:  Patient requiring short term acute titration and management of vasoactive drips, and invasive monitoring (i.e., CVP and Arterial line).  May admit patient to [facility] or [facility] if equivalent level of care is available:: No  Covid Evaluation: Asymptomatic - no recent exposure (last 10 days) testing not required  Diagnosis: Rhabdomyolysis [728.88.ICD-9-CM]  Admitting Physician: Toy Baker [[id number]]  Attending Physician: Toy Baker [[id number]]  Estimated length of stay: past midnight tomorrow  Certification:: I certify this patient will need inpatient services for at least 2 midnights          B Medical/Surgery History Past Medical History:  Diagnosis Date   Arthritis    GERD (gastroesophageal reflux disease)     Headache    Hypertension    Stroke ([place]) 11/2012   Tooth pain    uses BC powders for teeth pain   Past Surgical History:  Procedure Laterality Date   RADIOLOGY WITH ANESTHESIA N/A 12/03/2012   Procedure: RADIOLOGY WITH ANESTHESIA;  Surgeon: Rob Hickman, MD;  Location: [place];  Service: Radiology;  Laterality: N/A;   TOTAL HIP ARTHROPLASTY Left 08/06/2016   Procedure: LEFT TOTAL HIP ARTHROPLASTY ANTERIOR APPROACH;  Surgeon: Gaynelle Arabian, MD;  Location: [facility];  Service: Orthopedics;  Laterality: Left;     A IV Location/Drains/Wounds Patient Lines/Drains/Airways Status     Active Line/Drains/Airways     Name Placement date Placement time Site Days   Peripheral IV 12/23/21 22 G Anterior;Left Hand 12/23/21  1500  Hand  less than 1   Peripheral IV 12/23/21 20 G Anterior;Proximal;Right Forearm 12/23/21  1530  Forearm  less than 1   Incision 12/03/12 Groin Right 12/03/12  2008  -- 3307   Incision 12/17/12 Head Right 12/17/12  1700  -- 3293   Incision (Closed) 08/06/16 Hip Left 08/06/16  0810  -- 1965   Wound 12/11/12 Abrasion(s) Arm Right abrasions on is Right forearm 12/11/12  0800  Arm  3299            Intake/Output Last 24 hours  Intake/Output Summary (Last 24 hours) at 12/23/2021 2239 Last data filed at 12/23/2021 1911 Gross per 24 hour  Intake 2000 ml  Output 100 ml  Net 1900 ml    Labs/Imaging Results for orders placed or performed during the hospital encounter of 12/23/21 (  from the past 48 hour(s))  Magnesium     Status: Abnormal   Collection Time: 12/23/21  3:20 PM  Result Value Ref Range   Magnesium 3.3 (H) 1.7 - 2.4 mg/dL    Comment: Performed at [facility], [place] [address], [place], [place] [address]  Osmolality     Status: Abnormal   Collection Time: 12/23/21  3:20 PM  Result Value Ref Range   Osmolality 341 (HH) 275 - 295 mOsm/kg    Comment: REPEATED TO VERIFY CRITICAL RESULT CALLED TO, READ BACK BY AND VERIFIED WITH: Hiroto Saltzman,D EMTP  12/23/2021 AT 2142 SKEEN,P Performed at [facility], [place] [address], [place], [place] [address]   Phosphorus     Status: None   Collection Time: 12/23/21  3:20 PM  Result Value Ref Range   Phosphorus 2.7 2.5 - 4.6 mg/dL    Comment: Performed at [facility], [place] [address], [place], [place] [address]  Protime-INR     Status: None   Collection Time: 12/23/21  3:20 PM  Result Value Ref Range   Prothrombin Time 14.4 11.4 - 15.2 seconds   INR 1.1 0.8 - 1.2    Comment: (NOTE) INR goal varies based on device and disease states. Performed at [facility], [place] [address] [place], [place] [address]   TSH     Status: None   Collection Time: 12/23/21  3:20 PM  Result Value Ref Range   TSH 0.591 0.350 - 4.500 uIU/mL    Comment: Performed by a 3rd Generation assay with a functional sensitivity of <=0.01 uIU/mL. Performed at [facility], [place] [address] [place], [place] [address]   CK     Status: Abnormal   Collection Time: 12/23/21  4:04 PM  Result Value Ref Range   Total CK 8,691 (H) 49 - 397 U/L    Comment: RESULTS CONFIRMED BY MANUAL DILUTION Performed at [facility], [place] [address] [place], [place] [address]   Comprehensive metabolic panel     Status: Abnormal   Collection Time: 12/23/21  4:04 PM  Result Value Ref Range   Sodium 150 (H) 135 - 145 mmol/L   Potassium 4.3 3.5 - 5.1 mmol/L   Chloride 114 (H) 98 - 111 mmol/L   CO2 23 22 - 32 mmol/L   Glucose, Bld 164 (H) 70 - 99 mg/dL    Comment: Glucose reference range applies only to samples taken after fasting for at least 8 hours.   BUN 80 (H) 8 - 23 mg/dL   Creatinine, Ser 2.27 (H) 0.61 - 1.24 mg/dL   Calcium 9.7 8.9 - 10.3 mg/dL   Total Protein 7.4 6.5 - 8.1 g/dL   Albumin 4.1 3.5 - 5.0 g/dL   AST 161 (H) 15 - 41 U/L   ALT 60 (H) 0 - 44 U/L   Alkaline Phosphatase 91 38 - 126 U/L   Total Bilirubin 2.3 (H) 0.3 - 1.2 mg/dL   GFR, Estimated 32  (L) >60 mL/min    Comment: (NOTE) Calculated using the CKD-EPI Creatinine Equation (2021)    Anion gap 13 5 - 15    Comment: Performed at [facility], [place] [address] [place], [place] [address]  CBC with Differential     Status: Abnormal   Collection Time: 12/23/21  4:04 PM  Result Value Ref Range   WBC 13.5 (H) 4.0 - 10.5 K/uL   RBC 6.11 (H) 4.22 - 5.81 MIL/uL  Hemoglobin 20.5 (H) 13.0 - 17.0 g/dL    Comment: REPEATED TO VERIFY   HCT 61.8 (H) 39.0 - 52.0 %   MCV 101.1 (H) 80.0 - 100.0 fL   MCH 33.6 26.0 - 34.0 pg   MCHC 33.2 30.0 - 36.0 g/dL   RDW 13.6 11.5 - 15.5 %   Platelets 141 (L) 150 - 400 K/uL   nRBC 0.0 0.0 - 0.2 %   Neutrophils Relative % 83 %   Neutro Abs 11.1 (H) 1.7 - 7.7 K/uL   Lymphocytes Relative 5 %   Lymphs Abs 0.7 0.7 - 4.0 K/uL   Monocytes Relative 12 %   Monocytes Absolute 1.6 (H) 0.1 - 1.0 K/uL   Eosinophils Relative 0 %   Eosinophils Absolute 0.0 0.0 - 0.5 K/uL   Basophils Relative 0 %   Basophils Absolute 0.1 0.0 - 0.1 K/uL   Immature Granulocytes 0 %   Abs Immature Granulocytes 0.06 0.00 - 0.07 K/uL    Comment: Performed at [facility], [place] [address] [place], [place] [address]  Ethanol     Status: None   Collection Time: 12/23/21  4:04 PM  Result Value Ref Range   Alcohol, Ethyl (B) <10 <10 mg/dL    Comment: (NOTE) Lowest detectable limit for serum alcohol is 10 mg/dL.  For medical purposes only. Performed at [facility], [place] [address] [place], [place] [address]   Troponin I (High Sensitivity)     Status: Abnormal   Collection Time: 12/23/21  4:04 PM  Result Value Ref Range   Troponin I (High Sensitivity) 39 (H) <18 ng/L    Comment: (NOTE) Elevated high sensitivity troponin I (hsTnI) values and significant  changes across serial measurements may suggest ACS but many other  chronic and acute conditions are known to elevate hsTnI results.  Refer to the "Links" section for chest  pain algorithms and additional  guidance. Performed at [facility], [place] [address] [place], [place] [address]   Hemoglobin A1c     Status: Abnormal   Collection Time: 12/23/21  4:04 PM  Result Value Ref Range   Hgb A1c MFr Bld 6.2 (H) 4.8 - 5.6 %    Comment: (NOTE) Pre diabetes:          5.7%-6.4%  Diabetes:              >6.4%  Glycemic control for   <7.0% adults with diabetes    Mean Plasma Glucose 131.24 mg/dL    Comment: Performed at [place] [address] [place], [place] [address]  Blood gas, venous     Status: Abnormal   Collection Time: 12/23/21  6:58 PM  Result Value Ref Range   pH, Ven 7.35 7.25 - 7.43   pCO2, Ven 50 44 - 60 mmHg   pO2, Ven 51 (H) 32 - 45 mmHg   Bicarbonate 27.6 20.0 - 28.0 mmol/L   Acid-Base Excess 1.2 0.0 - 2.0 mmol/L   O2 Saturation 85.4 %   Patient temperature 37.0     Comment: Performed at [facility], [place] [address], [place], [place] [address]  Urinalysis, Routine w reflex microscopic Urine, Clean Catch     Status: Abnormal   Collection Time: 12/23/21  7:00 PM  Result Value Ref Range   Color, Urine AMBER (A) YELLOW    Comment: BIOCHEMICALS MAY BE AFFECTED BY COLOR   APPearance CLOUDY (A) CLEAR   Specific Gravity, Urine 1.017  1.005 - 1.030   pH 5.0 5.0 - 8.0   Glucose, UA >=500 (A) NEGATIVE mg/dL   Hgb urine dipstick LARGE (A) NEGATIVE   Bilirubin Urine NEGATIVE NEGATIVE   Ketones, ur 5 (A) NEGATIVE mg/dL   Protein, ur 100 (A) NEGATIVE mg/dL   Nitrite NEGATIVE NEGATIVE   Leukocytes,Ua TRACE (A) NEGATIVE   RBC / HPF 0-5 0 - 5 RBC/hpf   WBC, UA 21-50 0 - 5 WBC/hpf   Bacteria, UA MANY (A) NONE SEEN   Squamous Epithelial / LPF 0-5 0 - 5   Mucus PRESENT    Hyaline Casts, UA PRESENT     Comment: Performed at [facility], [place] [address], [place], [place] [address]  Ammonia     Status: Abnormal   Collection Time: 12/23/21  7:00 PM  Result Value Ref Range   Ammonia 37  (H) 9 - 35 umol/L    Comment: Performed at [facility], [place] [address], [place], [place] [address]  Creatinine, urine, random     Status: None   Collection Time: 12/23/21  7:00 PM  Result Value Ref Range   Creatinine, Urine 194.02 mg/dL    Comment: Performed at [facility], [place] [address] [place], [place] [address]  Osmolality, urine     Status: None   Collection Time: 12/23/21  7:00 PM  Result Value Ref Range   Osmolality, Ur 623 300 - 900 mOsm/kg    Comment: Performed at [place] [address] [place], [place] [address]  Sodium, urine, random     Status: None   Collection Time: 12/23/21  7:00 PM  Result Value Ref Range   Sodium, Ur 57 mmol/L    Comment: Performed at [facility], [place] [address] [place], [place] [address]  Rapid urine drug screen (hospital performed)     Status: None   Collection Time: 12/23/21  7:00 PM  Result Value Ref Range   Opiates NONE DETECTED NONE DETECTED   Cocaine NONE DETECTED NONE DETECTED   Benzodiazepines NONE DETECTED NONE DETECTED   Amphetamines NONE DETECTED NONE DETECTED   Tetrahydrocannabinol NONE DETECTED NONE DETECTED   Barbiturates NONE DETECTED NONE DETECTED    Comment: (NOTE) DRUG SCREEN FOR MEDICAL PURPOSES ONLY.  IF CONFIRMATION IS NEEDED FOR ANY PURPOSE, NOTIFY LAB WITHIN 5 DAYS.  LOWEST DETECTABLE LIMITS FOR URINE DRUG SCREEN Drug Class                     Cutoff (ng/mL) Amphetamine and metabolites    1000 Barbiturate and metabolites    200 Benzodiazepine                 [id number] Tricyclics and metabolites     300 Opiates and metabolites        300 Cocaine and metabolites        300 THC                            50 Performed at [facility], [place] [address], [place], [place] [address]   Lactic acid, plasma     Status: None   Collection Time: 12/23/21  7:00 PM  Result Value Ref Range   Lactic Acid, Venous 1.4 0.5 - 1.9 mmol/L    Comment:  Performed at [facility], [place] [address] [place], [place] [address]  CBG monitoring, ED  Status: Abnormal   Collection Time: 12/23/21  7:37 PM  Result Value Ref Range   Glucose-Capillary 122 (H) 70 - 99 mg/dL    Comment: Glucose reference range applies only to samples taken after fasting for at least 8 hours.  Basic metabolic panel     Status: Abnormal   Collection Time: 12/23/21  8:02 PM  Result Value Ref Range   Sodium 150 (H) 135 - 145 mmol/L   Potassium 4.2 3.5 - 5.1 mmol/L   Chloride 119 (H) 98 - 111 mmol/L   CO2 25 22 - 32 mmol/L   Glucose, Bld 117 (H) 70 - 99 mg/dL    Comment: Glucose reference range applies only to samples taken after fasting for at least 8 hours.   BUN 70 (H) 8 - 23 mg/dL   Creatinine, Ser 1.78 (H) 0.61 - 1.24 mg/dL   Calcium 8.0 (L) 8.9 - 10.3 mg/dL   GFR, Estimated 43 (L) >60 mL/min    Comment: (NOTE) Calculated using the CKD-EPI Creatinine Equation (2021)    Anion gap 6 5 - 15    Comment: Performed at [facility], [place] [address] [place], [place] [address]   CT Head Wo Contrast  Result Date: 12/23/2021 CLINICAL DATA:  Head trauma, abnormal mental status. Patient found on floor. Generalized weakness. EXAM: CT HEAD WITHOUT CONTRAST TECHNIQUE: Contiguous axial images were obtained from the base of the skull through the vertex without intravenous contrast. RADIATION DOSE REDUCTION: This exam was performed according to the departmental dose-optimization program which includes automated exposure control, adjustment of the mA and/or kV according to patient size and/or use of iterative reconstruction technique. 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 Encephalomalacia of the right greater than left anterior inferior frontal lobes noted. Right-sided changes extend more superiorly. This is consistent with the areas  previously noted. Chronic encephalomalacia is present in the more posterior right frontal parietal lobe near the vertex. No acute infarct, hemorrhage, or mass lesion is present. Basal ganglia are intact. Insular ribbon is normal. No acute or focal cortical abnormalities are present. The brainstem and cerebellum are within normal limits. Vascular: Aneurysm coil noted. No hyperdense vessel or other unexpected calcifications. Skull: Calvarium is intact. No focal lytic or blastic lesions are present. No significant extracranial soft tissue lesion is present. Sinuses/Orbits: The paranasal sinuses and mastoid air cells are clear. The globes and orbits are within normal limits. IMPRESSION: 1. No acute intracranial abnormality or significant interval change. 2. Encephalomalacia of the anterior inferior frontal lobes bilaterally, right greater than left. This is consistent with the areas previously noted. 3. Chronic encephalomalacia in the more posterior right frontal parietal lobe near the vertex. 4. 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 Electronically Signed   By: San Morelle M.D.   On: 12/23/2021 16:35   DG Chest Portable 1 View  Result Date: 12/23/2021 CLINICAL DATA:  Weakness. Found on floor for unknown amount of time. EXAM: PORTABLE CHEST 1 VIEW COMPARISON:  AP chest 12/13/2012 FINDINGS: Interval removal of right internal jugular central venous catheter compared to prior remote comparison study. Cardiac silhouette and mediastinal contours are within normal limits. Mild bibasilar horizontal linear scarring is similar to prior. Right infrahilar heterogeneous airspace opacification is resolved. No definite focal airspace opacity to indicate pneumonia. No pleural effusion or pneumothorax. No acute skeletal abnormality. IMPRESSION: Mild bibasilar horizontal linear scarring.  No acute lung process. Electronically Signed  By: Yvonne Kendall M.D.   On: 12/23/2021 16:26    Pending  Labs Unresulted Labs (From admission, onward)     Start     Ordered   12/24/21 0500  Prealbumin  Tomorrow morning,   R        12/23/21 1844   12/24/21 0000  CBC  Once,   R        12/23/21 1937   12/23/21 0000000  Basic metabolic panel  Now then every 6 hours,   R (with TIMED occurrences)      12/23/21 1846   12/23/21 2200  CK  Now then every 8 hours,   R (with TIMED occurrences)      12/23/21 2141   12/23/21 2139  Urine Culture  (Urine Culture)  Once,   R       Question:  Indication  Answer:  Altered mental status (if no other cause identified)   12/23/21 2139   12/23/21 1848  Lactic acid, plasma  STAT Now then every 3 hours,   R (with STAT occurrences)      12/23/21 1847   Signed and Held  HIV Antibody (routine testing w rflx)  (HIV Antibody (Routine testing w reflex) panel)  Once,   R        Signed and Held   Signed and Held  Comprehensive metabolic panel  Tomorrow morning,   R       Question:  Release to patient  Answer:  Immediate   Signed and Held   Signed and Held  CBC  Tomorrow morning,   R       Question:  Release to patient  Answer:  Immediate   Signed and Held            Vitals/Pain Today's Vitals   12/23/21 1830 12/23/21 1911 12/23/21 2140 12/23/21 2223  BP: (!) 136/59  121/71   Pulse: 88  89   Resp: 15  (!) 27   Temp:    (!) 97.3 F (36.3 C)  TempSrc:    Oral  SpO2: 97%  97%   PainSc:  0-No pain      Isolation Precautions No active isolations  Medications Medications  sodium bicarbonate 150 mEq in sterile water 1,150 mL infusion ( Intravenous New Bag/Given 12/23/21 1754)  insulin aspart (novoLOG) injection 0-9 Units ( Subcutaneous Not Given 12/23/21 1957)  cefTRIAXone (ROCEPHIN) 2 g in sodium chloride 0.9 % 100 mL IVPB (2 g Intravenous New Bag/Given 12/23/21 2222)  sodium chloride 0.9 % bolus 1,000 mL (0 mLs Intravenous Stopped 12/23/21 1755)  lactated ringers bolus 1,000 mL (0 mLs Intravenous Stopped 12/23/21 1900)    Mobility walks with person  assist High fall risk   Focused Assessments Cardiac Assessment Handoff:    Lab Results  Component Value Date   CKTOTAL 8,691 (H) 12/23/2021   No results found for: "DDIMER" Does the Patient currently have chest pain? No    R Recommendations: See Admitting Provider Note  Report given to: Rayfield Citizen RN  Additional Notes:

## 2021-12-23 NOTE — Assessment & Plan Note (Signed)
Rehydrate and follow orthostatics

## 2021-12-23 NOTE — Assessment & Plan Note (Signed)
CK elevated 8900 Eval ER provider discussed case with nephrology who recommends admit for IV fluid resuscitation Started bicarb drip

## 2021-12-23 NOTE — Assessment & Plan Note (Signed)
Noted multiple PVCs on EKG patient is unable to tell if he had any syncope.  He thinks that he may have fallen but he does not know how he ended up on the floor. Troponin mildly elevated denies any current chest pain.  We will continue to monitor order echogram.  Monitor on telemetry. Correct electrolytes

## 2021-12-23 NOTE — Assessment & Plan Note (Signed)
In the setting of rhabdo and dehydration will repeat once patient is rehydrated and continue to follow

## 2021-12-23 NOTE — H&P (Signed)
Dennis Wyatt POL:[id number] DOB: 1961-06-22 DOA: 12/23/2021     PCP: [name], MD   Outpatient Specialists:  NONE   Patient arrived to ER on 12/23/21 at 1456 Referred by Attending Therisa Doyne, MD   Patient coming from:    home Lives alone,     Chief Complaint:   Chief Complaint  Patient presents with   Altered Mental Status    HPI: Shakeer Wyatt is a 61 y.o. male with medical history significant of tobacco abuse, HTN   Hx of subarachnoid bleed, hx of DVT now on on anticoagulation   Presented with   confusion  Patient was found by the family at home that had to break in the window to get him.  Have not seen him since Friday.  Patient has history of subarachnoid bleed with difficult to continue ambulation and frequent falls was found on the floor sitting up. Patient living situation as complicated by holding He was found to be intermittently confused started on IV fluids initial blood pressure 140s over 40-73 pulse 102 satting 91% started on 4 L  Reports still smokes does not drink  Denies any CP no head injusry he does not know how long he was on the floor    Regarding pertinent Chronic problems:       HTN unsure what he takes      DM 2 states he inject something every 2 wks    obesity-   BMI Readings from Last 1 Encounters:  08/06/16 39.86 kg/m       COPD ?- not  followed by pulmonology   not  on baseline oxygen         Hx of CVA -  with/out residual       Hx of DVT/PE on -  was on Xarelto,now off      While in ER:   Hg up to 20  Tele multifocal PVC's    Ordered  CT HEAD 1. No acute intracranial abnormality or significant interval change. 2. Encephalomalacia of the anterior inferior frontal lobes bilaterally, right greater than left. This is consistent with the areas previously noted. 3. Chronic encephalomalacia in the more posterior right frontal parietal lobe near the vertex. 4. 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  CXR - Mild bibasilar horizontal linear scarring.  No acute lung process.    Following Medications were ordered in ER: Medications  sodium bicarbonate 150 mEq in sterile water 1,150 mL infusion ( Intravenous New Bag/Given 12/23/21 1754)  sodium chloride 0.9 % bolus 1,000 mL (0 mLs Intravenous Stopped 12/23/21 1755)  lactated ringers bolus 1,000 mL (1,000 mLs Intravenous New Bag/Given 12/23/21 1754)    _______________________________________________________ ER Provider Called:    nephrology  Dr. Scotty Court They Recommend admit to medicine    Start bicab drip rehydrate   ED Triage Vitals  Enc Vitals Group     BP 12/23/21 1545 126/68     Pulse Rate 12/23/21 1545 99     Resp 12/23/21 1600 18     Temp 12/23/21 1600 97.6 F (36.4 C)     Temp Source 12/23/21 1600 Oral     SpO2 12/23/21 1545 91 %     Weight --      Height --      Head Circumference --      Peak Flow --      Pain Score --      Pain Loc --      Pain  Edu? --      Excl. in [place]? --   TMAX(24)@     _________________________________________ Significant initial  Findings: Abnormal Labs Reviewed  CK - Abnormal; Notable for the following components:      Result Value   Total CK 8,691 (*)    All other components within normal limits  COMPREHENSIVE METABOLIC PANEL - Abnormal; Notable for the following components:   Sodium 150 (*)    Chloride 114 (*)    Glucose, Bld 164 (*)    BUN 80 (*)    Creatinine, Ser 2.27 (*)    AST 161 (*)    ALT 60 (*)    Total Bilirubin 2.3 (*)    GFR, Estimated 32 (*)    All other components within normal limits  CBC WITH DIFFERENTIAL/PLATELET - Abnormal; Notable for the following components:   WBC 13.5 (*)    RBC 6.11 (*)    Hemoglobin 20.5 (*)    HCT 61.8 (*)    MCV 101.1 (*)    Platelets 141 (*)    Neutro Abs 11.1 (*)    Monocytes Absolute 1.6 (*)    All other components within normal limits     _________________________ Troponin  39 ECG:  Ordered Personally reviewed by me showing: HR : 101 Rhythm: Sinus tachycardia Nonspecific IVCD with LAD Inferior infarct, old Anterior infarct, old QTC37   The recent clinical data is shown below. Vitals:   12/23/21 1630 12/23/21 1700 12/23/21 1730 12/23/21 1830  BP: 139/72 124/69 139/71 (!) 136/59  Pulse: 94 93 94 88  Resp: 15 16 17 15   Temp:      TempSrc:      SpO2: 95% 95% 94% 97%    WBC     Component Value Date/Time   WBC 13.5 (H) 12/23/2021 1604   LYMPHSABS 0.7 12/23/2021 1604   MONOABS 1.6 (H) 12/23/2021 1604   EOSABS 0.0 12/23/2021 1604   BASOSABS 0.1 12/23/2021 1604      Lactic Acid, Venous    Component Value Date/Time   LATICACIDVEN 1.4 12/23/2021 1900       UA   evidence of UTI     Urine analysis:    Component Value Date/Time   COLORURINE AMBER (A) 12/23/2021 1900   APPEARANCEUR CLOUDY (A) 12/23/2021 1900   LABSPEC 1.017 12/23/2021 1900   PHURINE 5.0 12/23/2021 1900   GLUCOSEU >=500 (A) 12/23/2021 1900   HGBUR LARGE (A) 12/23/2021 1900   BILIRUBINUR NEGATIVE 12/23/2021 1900   KETONESUR 5 (A) 12/23/2021 1900   PROTEINUR 100 (A) 12/23/2021 1900   UROBILINOGEN 0.2 12/21/2012 1801   NITRITE NEGATIVE 12/23/2021 1900   LEUKOCYTESUR TRACE (A) 12/23/2021 1900     _______________________________________________ Hospitalist was called for admission for   Altered mental status, unspecified altered mental status type   Non-traumatic rhabdomyolysis  AKI (acute kidney injury) ([place])     The following Work up has been ordered so far:  Orders Placed This Encounter  Procedures   CT Head Wo Contrast   DG Chest Portable 1 View   CK   Comprehensive metabolic panel   CBC with Differential   Urinalysis, Routine w reflex microscopic   Ethanol   Ammonia   Blood gas, venous   Creatinine, urine, random   Magnesium   Osmolality   Osmolality, urine   Phosphorus   Prealbumin   Protime-INR   Sodium, urine, random   TSH   Rapid urine drug screen  (hospital performed)   Cardiac monitoring  Consult to nephrology   Consult to hospitalist   ED EKG   EKG 12-Lead   Admit to Inpatient (patient's expected length of stay will be greater than 2 midnights or inpatient only procedure)     OTHER Significant initial  Findings:  labs showing:    Recent Labs  Lab 12/23/21 1520 12/23/21 1604 12/23/21 2002  NA  --  150* 150*  K  --  4.3 4.2  CO2  --  23 25  GLUCOSE  --  164* 117*  BUN  --  80* 70*  CREATININE  --  2.27* 1.78*  CALCIUM  --  9.7 8.0*  MG 3.3*  --   --   PHOS 2.7  --   --     Cr    Up from baseline see below Lab Results  Component Value Date   CREATININE 2.27 (H) 12/23/2021   CREATININE 0.65 08/08/2016   CREATININE 0.62 08/07/2016    Recent Labs  Lab 12/23/21 1604  AST 161*  ALT 60*  ALKPHOS 91  BILITOT 2.3*  PROT 7.4  ALBUMIN 4.1   Lab Results  Component Value Date   CALCIUM 8.0 (L) 12/23/2021   PHOS 2.7 12/23/2021       Plt: Lab Results  Component Value Date   PLT 141 (L) 12/23/2021    Venous  Blood Gas result:  pH   Latest Reference Range & Units Most Recent  pH, Ven 7.25 - 7.43  7.35 12/23/21 18:58  pCO2, Ven 44 - 60 mmHg 50 12/23/21 18:58  pO2, Ven 32 - 45 mmHg 51 (H) 12/23/21 18:58  (H): Data is abnormally high      Recent Labs  Lab 12/23/21 1604  WBC 13.5*  NEUTROABS 11.1*  HGB 20.5*  HCT 61.8*  MCV 101.1*  PLT 141*    HG/HCT  Up from baseline see below    Component Value Date/Time   HGB 20.5 (H) 12/23/2021 1604   HCT 61.8 (H) 12/23/2021 1604   MCV 101.1 (H) 12/23/2021 1604    Recent Labs  Lab 12/23/21 1900  AMMONIA 37*      Cardiac Panel (last 3 results) Recent Labs    12/23/21 1604  CKTOTAL 8,691*     DM  labs:  HbA1C: No results for input(s): "HGBA1C" in the last 8760 hours.     CBG (last 3)  Recent Labs    12/23/21 1937  GLUCAP 122*          Cultures:    Component Value Date/Time   SDES URINE, CLEAN CATCH 12/22/2012 1440   SPECREQUEST  NONE 12/22/2012 1440   CULT NO GROWTH 12/22/2012 1440   REPTSTATUS 12/23/2012 FINAL 12/22/2012 1440     Radiological Exams on Admission: CT Head Wo Contrast  Result Date: 12/23/2021 CLINICAL DATA:  Head trauma, abnormal mental status. Patient found on floor. Generalized weakness. EXAM: CT HEAD WITHOUT CONTRAST TECHNIQUE: Contiguous axial images were obtained from the base of the skull through the vertex without intravenous contrast. RADIATION DOSE REDUCTION: This exam was performed according to the departmental dose-optimization program which includes automated exposure control, adjustment of the mA and/or kV according to patient size and/or use of iterative reconstruction technique. 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 Encephalomalacia of the right greater than left anterior inferior frontal lobes noted. Right-sided changes extend more superiorly. This is consistent with the areas previously noted. Chronic encephalomalacia is present in  the more posterior right frontal parietal lobe near the vertex. No acute infarct, hemorrhage, or mass lesion is present. Basal ganglia are intact. Insular ribbon is normal. No acute or focal cortical abnormalities are present. The brainstem and cerebellum are within normal limits. Vascular: Aneurysm coil noted. No hyperdense vessel or other unexpected calcifications. Skull: Calvarium is intact. No focal lytic or blastic lesions are present. No significant extracranial soft tissue lesion is present. Sinuses/Orbits: The paranasal sinuses and mastoid air cells are clear. The globes and orbits are within normal limits. IMPRESSION: 1. No acute intracranial abnormality or significant interval change. 2. Encephalomalacia of the anterior inferior frontal lobes bilaterally, right greater than left. This is consistent with the areas previously noted. 3. Chronic  encephalomalacia in the more posterior right frontal parietal lobe near the vertex. 4. 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 Electronically Signed   By: San Morelle M.D.   On: 12/23/2021 16:35   DG Chest Portable 1 View  Result Date: 12/23/2021 CLINICAL DATA:  Weakness. Found on floor for unknown amount of time. EXAM: PORTABLE CHEST 1 VIEW COMPARISON:  AP chest 12/13/2012 FINDINGS: Interval removal of right internal jugular central venous catheter compared to prior remote comparison study. Cardiac silhouette and mediastinal contours are within normal limits. Mild bibasilar horizontal linear scarring is similar to prior. Right infrahilar heterogeneous airspace opacification is resolved. No definite focal airspace opacity to indicate pneumonia. No pleural effusion or pneumothorax. No acute skeletal abnormality. IMPRESSION: Mild bibasilar horizontal linear scarring.  No acute lung process. Electronically Signed   By: Yvonne Kendall M.D.   On: 12/23/2021 16:26   _______________________________________________________________________________________________________ Latest  Blood pressure (!) 136/59, pulse 88, temperature 97.6 F (36.4 C), temperature source Oral, resp. rate 15, SpO2 97 %.   Vitals  labs and radiology finding personally reviewed  Review of Systems:    Pertinent positives include:  fatigue, falls  Constitutional:  No weight loss, night sweats, Fevers, chills, weight loss  HEENT:  No headaches, Difficulty swallowing,Tooth/dental problems,Sore throat,  No sneezing, itching, ear ache, nasal congestion, post nasal drip,  Cardio-vascular:  No chest pain, Orthopnea, PND, anasarca, dizziness, palpitations.no Bilateral lower extremity swelling  GI:  No heartburn, indigestion, abdominal pain, nausea, vomiting, diarrhea, change in bowel habits, loss of appetite, melena, blood in stool, hematemesis Resp:  no shortness of breath at rest. No dyspnea on  exertion, No excess mucus, no productive cough, No non-productive cough, No coughing up of blood.No change in color of mucus.No wheezing. Skin:  no rash or lesions. No jaundice GU:  no dysuria, change in color of urine, no urgency or frequency. No straining to urinate.  No flank pain.  Musculoskeletal:  No joint pain or no joint swelling. No decreased range of motion. No back pain.  Psych:  No change in mood or affect. No depression or anxiety. No memory loss.  Neuro: no localizing neurological complaints, no tingling, no weakness, no double vision, no gait abnormality, no slurred speech, no confusion  All systems reviewed and apart from [place] all are negative _______________________________________________________________________________________________ Past Medical History:   Past Medical History:  Diagnosis Date   Arthritis    GERD (gastroesophageal reflux disease)    Headache    Hypertension    Stroke ([facility]) 11/2012   Tooth pain    uses BC powders for teeth pain     Past Surgical History:  Procedure Laterality Date   RADIOLOGY WITH ANESTHESIA N/A 12/03/2012   Procedure: RADIOLOGY WITH ANESTHESIA;  Surgeon: Willaim Rayas  Stephani Police, MD;  Location: [facility];  Service: Radiology;  Laterality: N/A;   TOTAL HIP ARTHROPLASTY Left 08/06/2016   Procedure: LEFT TOTAL HIP ARTHROPLASTY ANTERIOR APPROACH;  Surgeon: Gaynelle Arabian, MD;  Location: [facility];  Service: Orthopedics;  Laterality: Left;    Social History:  Ambulatory     cane,     reports that he has been smoking cigarettes. He has been smoking an average of 1.5 packs per day. He has never used smokeless tobacco. He reports that he does not drink alcohol and does not use drugs.   Family History:   Family History  Problem Relation Age of Onset   Heart disease Mother    Stroke Mother    Cancer Father    Heart disease Father    Hypertension Father     ______________________________________________________________________________________________ Allergies: Allergies  Allergen Reactions   Oxycodone     itch     Prior to Admission medications   Medication Sig Start Date End Date Taking? Authorizing Provider  HYDROmorphone (DILAUDID) 2 MG tablet Take 1-2 tablets (2-4 mg total) by mouth every 4 (four) hours as needed for moderate pain or severe pain. 08/08/16   Perkins, Alexzandrew L, PA-C  methocarbamol (ROBAXIN) 500 MG tablet Take 1 tablet (500 mg total) by mouth every 6 (six) hours as needed for muscle spasms. 08/08/16   Perkins, Alexzandrew L, PA-C  rivaroxaban (XARELTO) 10 MG TABS tablet Take 1 tablet (10 mg total) by mouth daily with breakfast. Take Xarelto for two and a half more weeks following discharge from the hospital, then discontinue Xarelto. Once the patient has completed the Xarelto, they may resume the 81 mg Aspirin. 08/09/16   Perkins, Alexzandrew L, PA-C  traMADol (ULTRAM) 50 MG tablet Take 1-2 tablets (50-100 mg total) by mouth every 6 (six) hours as needed (MILD PAIN). 08/08/16   Perkins, Alexzandrew L, PA-C    ___________________________________________________________________________________________________ Physical Exam:    12/23/2021    6:30 PM 12/23/2021    5:30 PM 12/23/2021    5:00 PM  Vitals with BMI  Systolic [id number]  Diastolic 59 71 69  Pulse 88 94 93     1. General:  in No  Acute distress    Chronically ill   -appearing 2. Psychological: Alert and   Oriented to self 3. Head/ENT:    Dry Mucous Membranes                          Head Non traumatic, neck supple                          Poor Dentition 4. SKIN:   decreased Skin turgor,  Skin clean Dry and intact no rash multiple abrasions 5. Heart: Regular rate and rhythm no  Murmur, no Rub or gallop 6. Lungs:  no wheezes or crackles   7. Abdomen: Soft,  non-tender, Non distended  bowel sounds present 8. Lower extremities: no clubbing, cyanosis, no   edema 9. Neurologically Grossly intact, moving all 4 extremities equally   10. MSK: Normal range of motion    Chart has been reviewed  ______________________________________________________________________________________________  Assessment/Plan 61 y.o. male with medical history significant of tobacco abuse, HTN   Hx of subarachnoid bleed, hx of DVT now on on anticoagulation     Admitted for   Altered mental status, unspecified altered mental status type  Non-traumatic rhabdomyolysis  AKI (acute kidney injury) ([place])  Present on Admission:  Rhabdomyolysis  Acute respiratory failure (H[facility])  Altered mental status  Tobacco abuse  AKI (acute kidney injury) ([place])  Elevated LFTs  Hypernatremia  Dehydration  UTI (urinary tract infection)  Elevated troponin     Acute respiratory failure ([facility]) Initial chest x-ray unremarkable No chest pain Rehydrate and continue to follow patient is a smoker Possibly undiagnosed COPD Is unclear if patient at baseline may have some low-grade hypoxia he was aware of he does not go to doctors very often  Altered mental status In the setting of severe dehydration will rehydrate for mental status if does not improve may need further imaging including MRI Evaluate for any source of Patient obtain VBG  ammonia  Rhabdomyolysis CK elevated 8900 Eval ER provider discussed case with nephrology who recommends admit for IV fluid resuscitation Started bicarb drip  AKI (acute kidney injury) ([place]) In the setting of severe dehydration will rehydrate recheck creatinine obtain urine electrolytes if renal function does not improve may need nephrology consult and renal imaging  Elevated LFTs In the setting of rhabdo and dehydration will repeat once patient is rehydrated and continue to follow  Hypernatremia In the setting of severe dehydration will rehydrate and follow  Dehydration Rehydrate and follow orthostatics  DM2 (diabetes mellitus, type  2) ([place])  - Order Sensitive  SSI     -  check TSH and HgA1C  - Hold by mouth medications     UTI (urinary tract infection)  - treat with Rocephin        await results of urine culture and adjust antibiotic coverage as needed   Elevated troponin Noted multiple PVCs on EKG patient is unable to tell if he had any syncope.  He thinks that he may have fallen but he does not know how he ended up on the floor. Troponin mildly elevated denies any current chest pain.  We will continue to monitor order echogram.  Monitor on telemetry. Correct electrolytes   Other plan as per orders.  DVT prophylaxis:  SCD     Code Status:    Code Status: Prior FULL CODE  as per patient   I had personally discussed CODE STATUS with patient    Family Communication:   Family not at  Bedside    Disposition Plan:     likely will need placement for rehabilitation                         Following barriers for discharge:                            Electrolytes corrected                                                      Would benefit from PT/OT eval prior to DC  Ordered                   Swallow eval - SLP ordered                                       Transition of care consulted  Nutrition    consulted                  Wound care  consulted                                       Consults called:    nephrology aware reconsult as needed If abnormal echogram or persistent worsening troponin elevation will need cardiology consult in a.m.  Admission status:  ED Disposition     ED Disposition  Admit   Condition  --   Comment  Hospital Area: [facility] [[id number]]  Level of Care: Stepdown [14]  Admit to SDU based on following criteria: Hemodynamic compromise or significant risk of instability:  Patient requiring short term acute titration and management of vasoactive drips, and invasive monitoring (i.e., CVP and Arterial line).  May admit patient to [facility] or  [facility] if equivalent level of care is available:: No  Covid Evaluation: Asymptomatic - no recent exposure (last 10 days) testing not required  Diagnosis: Rhabdomyolysis [728.88.ICD-9-CM]  Admitting Physician: Toy Baker [[id number]]  Attending Physician: Toy Baker [[id number]]  Estimated length of stay: past midnight tomorrow  Certification:: I certify this patient will need inpatient services for at least 2 midnights           inpatient     I Expect 2 midnight stay secondary to severity of patient's current illness need for inpatient interventions justified by the following:  hemodynamic instability despite optimal treatment (tachycardia  hypoxia,  )   Severe lab/radiological/exam abnormalities including:   AKI dehydration rhabdomyolysis hypernatremia and extensive comorbidities including: DM2     COPD/asthma   That are currently affecting medical management.   I expect  patient to be hospitalized for 2 midnights requiring inpatient medical care.  Patient is at high risk for adverse outcome (such as loss of life or disability) if not treated.  Indication for inpatient stay as follows:  Severe change from baseline regarding mental status    New or worsening hypoxia   Need for IV antibiotics, IV fluids,    Level of care  stepdown indefinitely please discontinue once patient no longer qualifies COVID-19 Labs    Nadja Lina 12/23/2021, 9:48 PM    [facility]     after 2 AM please page floor coverage PA If 7AM-7PM, please contact the day team taking care of the patient using [email or web address]   Patient was evaluated in the context of the global COVID-19 pandemic, which necessitated consideration that the patient might be at risk for infection with the SARS-CoV-2 virus that causes COVID-19. Institutional protocols and algorithms that pertain to the evaluation of patients at risk for COVID-19 are in a state of rapid change based on information released by  regulatory bodies including the CDC and federal and state organizations. These policies and algorithms were followed during the patient's care.

## 2021-12-23 NOTE — Assessment & Plan Note (Signed)
-   Order Sensitive  SSI     -  check TSH and HgA1C  - Hold by mouth medications*  

## 2021-12-23 NOTE — Subjective & Objective (Signed)
Patient was found by the family at home that had to break in the window to get him.  Have not seen him since Friday.  Patient has history of subarachnoid bleed with difficult to continue ambulation and frequent falls was found on the floor sitting up. Patient living situation as complicated by holding He was found to be intermittently confused started on IV fluids initial blood pressure 140s over 40-73 pulse 102 satting 91% started on 4 L

## 2021-12-23 NOTE — ED Notes (Signed)
Pt voided on self by accident, pt was provided new brief, new linens, bedpad, fitted sheet applied, pt provided perineal care, pt repositioned to comfort.

## 2021-12-24 ENCOUNTER — Inpatient Hospital Stay (HOSPITAL_COMMUNITY): Payer: PPO

## 2021-12-24 DIAGNOSIS — R9431 Abnormal electrocardiogram [ECG] [EKG]: Secondary | ICD-10-CM

## 2021-12-24 DIAGNOSIS — M6282 Rhabdomyolysis: Secondary | ICD-10-CM | POA: Diagnosis not present

## 2021-12-24 LAB — COMPREHENSIVE METABOLIC PANEL
ALT: 69 U/L — ABNORMAL HIGH (ref 0–44)
AST: 193 U/L — ABNORMAL HIGH (ref 15–41)
Albumin: 3.4 g/dL — ABNORMAL LOW (ref 3.5–5.0)
Alkaline Phosphatase: 73 U/L (ref 38–126)
Anion gap: 7 (ref 5–15)
BUN: 65 mg/dL — ABNORMAL HIGH (ref 8–23)
CO2: 29 mmol/L (ref 22–32)
Calcium: 8.9 mg/dL (ref 8.9–10.3)
Chloride: 112 mmol/L — ABNORMAL HIGH (ref 98–111)
Creatinine, Ser: 1.37 mg/dL — ABNORMAL HIGH (ref 0.61–1.24)
GFR, Estimated: 58 mL/min — ABNORMAL LOW (ref >60)
Glucose, Bld: 112 mg/dL — ABNORMAL HIGH (ref 70–99)
Potassium: 4 mmol/L (ref 3.5–5.1)
Sodium: 148 mmol/L — ABNORMAL HIGH (ref 135–145)
Total Bilirubin: 1.6 mg/dL — ABNORMAL HIGH (ref 0.3–1.2)
Total Protein: 6.3 g/dL — ABNORMAL LOW (ref 6.5–8.1)

## 2021-12-24 LAB — BASIC METABOLIC PANEL
Anion gap: 7 (ref 5–15)
Anion gap: 13 (ref 5–15)
Anion gap: 12 (ref 5–15)
Anion gap: 11 (ref 5–15)
BUN: 64 mg/dL — ABNORMAL HIGH (ref 8–23)
BUN: 52 mg/dL — ABNORMAL HIGH (ref 8–23)
BUN: 49 mg/dL — ABNORMAL HIGH (ref 8–23)
BUN: 40 mg/dL — ABNORMAL HIGH (ref 8–23)
CO2: 30 mmol/L (ref 22–32)
CO2: 29 mmol/L (ref 22–32)
CO2: 30 mmol/L (ref 22–32)
CO2: 34 mmol/L — ABNORMAL HIGH (ref 22–32)
Calcium: 8.8 mg/dL — ABNORMAL LOW (ref 8.9–10.3)
Calcium: 8.6 mg/dL — ABNORMAL LOW (ref 8.9–10.3)
Calcium: 8.6 mg/dL — ABNORMAL LOW (ref 8.9–10.3)
Calcium: 8.6 mg/dL — ABNORMAL LOW (ref 8.9–10.3)
Chloride: 111 mmol/L (ref 98–111)
Chloride: 103 mmol/L (ref 98–111)
Chloride: 101 mmol/L (ref 98–111)
Chloride: 97 mmol/L — ABNORMAL LOW (ref 98–111)
Creatinine, Ser: 1.31 mg/dL — ABNORMAL HIGH (ref 0.61–1.24)
Creatinine, Ser: 1.14 mg/dL (ref 0.61–1.24)
Creatinine, Ser: 1.06 mg/dL (ref 0.61–1.24)
Creatinine, Ser: 0.93 mg/dL (ref 0.61–1.24)
GFR, Estimated: >60 mL/min (ref >60)
GFR, Estimated: >60 mL/min (ref >60)
GFR, Estimated: >60 mL/min (ref >60)
GFR, Estimated: >60 mL/min (ref >60)
Glucose, Bld: 111 mg/dL — ABNORMAL HIGH (ref 70–99)
Glucose, Bld: 119 mg/dL — ABNORMAL HIGH (ref 70–99)
Glucose, Bld: 112 mg/dL — ABNORMAL HIGH (ref 70–99)
Glucose, Bld: 117 mg/dL — ABNORMAL HIGH (ref 70–99)
Potassium: 4 mmol/L (ref 3.5–5.1)
Potassium: 3.9 mmol/L (ref 3.5–5.1)
Potassium: 3.7 mmol/L (ref 3.5–5.1)
Potassium: 3.5 mmol/L (ref 3.5–5.1)
Sodium: 148 mmol/L — ABNORMAL HIGH (ref 135–145)
Sodium: 145 mmol/L (ref 135–145)
Sodium: 143 mmol/L (ref 135–145)
Sodium: 142 mmol/L (ref 135–145)

## 2021-12-24 LAB — GLUCOSE, CAPILLARY
Glucose-Capillary: 118 mg/dL — ABNORMAL HIGH (ref 70–99)
Glucose-Capillary: 127 mg/dL — ABNORMAL HIGH (ref 70–99)
Glucose-Capillary: 128 mg/dL — ABNORMAL HIGH (ref 70–99)
Glucose-Capillary: 128 mg/dL — ABNORMAL HIGH (ref 70–99)
Glucose-Capillary: 133 mg/dL — ABNORMAL HIGH (ref 70–99)
Glucose-Capillary: 145 mg/dL — ABNORMAL HIGH (ref 70–99)
Glucose-Capillary: 146 mg/dL — ABNORMAL HIGH (ref 70–99)

## 2021-12-24 LAB — ECHOCARDIOGRAM COMPLETE
AR max vel: 2.86 cm2
AV Area VTI: 3.09 cm2
AV Area mean vel: 2.81 cm2
AV Mean grad: 7 mmHg
AV Peak grad: 12.3 mmHg
Ao pk vel: 1.75 m/s
Area-P 1/2: 3.6 cm2
Height: 72 in
S' Lateral: 3.8 cm
Weight: 4306.91 oz

## 2021-12-24 LAB — CBC
HCT: 57.4 % — ABNORMAL HIGH (ref 39.0–52.0)
Hemoglobin: 19.3 g/dL — ABNORMAL HIGH (ref 13.0–17.0)
MCH: 34 pg (ref 26.0–34.0)
MCHC: 33.6 g/dL (ref 30.0–36.0)
MCV: 101.2 fL — ABNORMAL HIGH (ref 80.0–100.0)
Platelets: 119 10*3/uL — ABNORMAL LOW (ref 150–400)
RBC: 5.67 MIL/uL (ref 4.22–5.81)
RDW: 13.5 % (ref 11.5–15.5)
WBC: 12.5 10*3/uL — ABNORMAL HIGH (ref 4.0–10.5)
nRBC: 0 % (ref 0.0–0.2)

## 2021-12-24 LAB — HIV ANTIBODY (ROUTINE TESTING W REFLEX): HIV Screen 4th Generation wRfx: NONREACTIVE

## 2021-12-24 LAB — URINE CULTURE: Culture: <10000 — AB

## 2021-12-24 LAB — TROPONIN I (HIGH SENSITIVITY)
Troponin I (High Sensitivity): 42 ng/L — ABNORMAL HIGH (ref <18)
Troponin I (High Sensitivity): 34 ng/L — ABNORMAL HIGH (ref <18)

## 2021-12-24 LAB — MRSA NEXT GEN BY PCR, NASAL: MRSA by PCR Next Gen: NOT DETECTED

## 2021-12-24 LAB — CK
Total CK: 8245 U/L — ABNORMAL HIGH (ref 49–397)
Total CK: 5279 U/L — ABNORMAL HIGH (ref 49–397)
Total CK: 3543 U/L — ABNORMAL HIGH (ref 49–397)

## 2021-12-24 LAB — PREALBUMIN: Prealbumin: 15.6 mg/dL — ABNORMAL LOW (ref 18–38)

## 2021-12-24 MED ORDER — LOPERAMIDE HCL 2 MG PO CAPS
2.0000 mg | ORAL_CAPSULE | Freq: Once | ORAL | Status: AC
Start: 2021-12-24 — End: 2021-12-24
  Administered 2021-12-24: 2 mg via ORAL
  Filled 2021-12-24: qty 1

## 2021-12-24 MED ORDER — NICOTINE 14 MG/24HR TD PT24
14.0000 mg | MEDICATED_PATCH | Freq: Every day | TRANSDERMAL | Status: DC
  Administered 2021-12-24 – 2021-12-26 (×3): 14 mg via TRANSDERMAL
  Filled 2021-12-24 (×3): qty 1

## 2021-12-24 MED ORDER — FOLIC ACID 1 MG PO TABS: 1.0000 mg | ORAL_TABLET | Freq: Every day | ORAL | Status: DC

## 2021-12-24 MED ORDER — LOPERAMIDE HCL 2 MG PO CAPS
2.0000 mg | ORAL_CAPSULE | ORAL | Status: DC | PRN
  Administered 2021-12-24: 2 mg via ORAL
  Filled 2021-12-24 (×2): qty 1

## 2021-12-24 MED ORDER — PERFLUTREN LIPID MICROSPHERE
1.0000 mL | INTRAVENOUS | Status: AC | PRN
  Administered 2021-12-24: 4 mL via INTRAVENOUS

## 2021-12-24 MED ORDER — ENOXAPARIN SODIUM 40 MG/0.4ML IJ SOSY
40.0000 mg | PREFILLED_SYRINGE | INTRAMUSCULAR | Status: DC
  Administered 2021-12-24 – 2021-12-26 (×3): 40 mg via SUBCUTANEOUS
  Filled 2021-12-24 (×3): qty 0.4

## 2021-12-24 MED ORDER — ADULT MULTIVITAMIN W/MINERALS CH: 1.0000 | ORAL_TABLET | Freq: Every day | ORAL | Status: DC

## 2021-12-24 MED ORDER — THIAMINE HCL 100 MG/ML IJ SOLN: 100.0000 mg | Freq: Every day | INTRAMUSCULAR | Status: DC

## 2021-12-24 MED ORDER — LORAZEPAM 2 MG/ML IJ SOLN: 1.0000 mg | INTRAMUSCULAR | Status: DC | PRN

## 2021-12-24 MED ORDER — LORAZEPAM 1 MG PO TABS: 1.0000 mg | ORAL_TABLET | ORAL | Status: DC | PRN

## 2021-12-24 MED ORDER — THIAMINE HCL 100 MG PO TABS: 100.0000 mg | ORAL_TABLET | Freq: Every day | ORAL | Status: DC

## 2021-12-24 NOTE — Progress Notes (Signed)
  Echocardiogram 2D Echocardiogram has been performed.  Rodrigo Ran 12/24/2021, 3:07 PM

## 2021-12-24 NOTE — Consult Note (Addendum)
WOC Nurse Consult Note: Reason for Consult: buttock DTPI  Wound type: Right upper buttock; Deep Tissue Pressure Injury (DTPI); found down at home.  Pressure Injury POA: Yes Measurement:8cnm x 2cm x 0cm  Wound JQZ:ESPQ purple; non blanchable  Drainage (amount, consistency, odor) none Periwound: intact  Dressing procedure/placement/frequency: Silicone foam; change every 3 days. Turn and reposition at least every 2 hours.   Pressure redistribution cushion.  Discussed POC with patient and bedside nurse.  Re consult if needed, will not follow at this time. Thanks  Tekeshia Klahr [employer], RN,CWOCN, CNS, CWON-AP [phone])

## 2021-12-24 NOTE — Plan of Care (Signed)
Discussed with patient plan of care for the evening, pain management and medications for his diarrhea with some teach back displayed.  Patient update his family hisself.  Problem: Education: Goal: Knowledge of General Education information will improve Description: Including pain rating scale, medication(s)/side effects and non-pharmacologic comfort measures Outcome: Progressing   Problem: Health Behavior/Discharge Planning: Goal: Ability to manage health-related needs will improve Outcome: Progressing
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
## 2021-12-24 NOTE — Progress Notes (Signed)
BSE completed, Full report to follow.  Pt with intact oropharyngeal swallow from clinical swallow evaluation. He easily passed 3 ounce [place] water screen and was observed to consume thin water, entire sandwich and [place] ice during session. Swallow clinically judged to be timely without evidence of difficulties, retention, etc.  Voice is hoarse but pt and his niece report this to be baseline.  Pt is edentulous - does not have dentures. Recommend continue diet as tolerated.  No SLP follow up.  Reviewed general aspiration precautions with pt.  Rolena Infante, MS [facility] SLP Acute Rehab Services Office [phone] Pager [phone]
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
## 2021-12-24 NOTE — Progress Notes (Addendum)
PROGRESS NOTE  Dennis Wyatt ZOX:[id number] DOB: [birth year] DOA: 12/23/2021 PCP: [name], Wilson Oliver, MD  HPI/Recap of past 24 hours: Dennis HowDennis Elks is a 61 y.o. male with medical history significant of tobacco abuse, HTN,subarachnoid bleed, DVT not on anticoagulation, ambulatory dysfunction with frequent falls, uses a cane, who presented via EMS with altered mental status after being found by family member on the floor for unknown period of time at his home.  Work-up in the ED revealed noncontrast CT nonacute, presumed UTI, AKI and rhabdomyolysis.  Started on bicarb drip and empiric IV antibiotics Rocephin, on admission.  12/24/21: Seen and examined at his bedside.  He has significant difficulty moving from the bed to the side of the bed even with 2 persons assistance.  Assessment/Plan: Principal Problem:   Rhabdomyolysis Active Problems:   Acute respiratory failure (HCC)   Altered mental status   Tobacco abuse   AKI (acute kidney injury) (HCC)   Elevated LFTs   Hypernatremia   Dehydration   DM2 (diabetes mellitus, type 2) (HCC)   UTI (urinary tract infection)   Elevated troponin  Acute metabolic encephalopathy, likely multifactorial secondary to dehydration, possible UTI. Improving Treat underlying conditions Reorient as needed Personally reviewed CT head showing no acute findings, revealing encephalomalacia from previous intracranial bleed.  Presumptive UTI, POA. Urine culture in process, continue to follow Was started on empiric IV Rocephin on admission.  Rhabdomyolysis Was found on the floor for unknown period of time Presenting CPK, peaked at 9562. Continue IV fluid hydration, downtrending  Elevated troponin, suspect demand ischemia in the setting of severe dehydration, AKI, rhabdomyolysis. Denies any anginal symptoms. High-sensitivity troponin peaked at 42 and down trended No evidence of acute ischemia on 12 lead EKG Continue to monitor on telemetry. Follow 2D  echo ordered on admission.  Frequent PVCs with activity Optimize magnesium and potassium levels Repeat 12 lead EKG Follow 2D echo ordered on admission to rule out any structural abnormalities.  Resolved AKI Baseline creatinine appears to be 1.1 with GFR greater than 60 Presented with creatinine of 2.27 with GFR of 32. Continue to avoid nephrotoxic agents, dehydration and hypotension. Continue strict I's and O's to monitor urine output. Repeat renal panel in the morning.  Resolved hypovolemic hypernatremia Presented with serum sodium of 150 Resolved with IV fluid hydration Encourage oral intake as tolerated  Elevated liver chemistries In the setting of rhabdomyolysis and dehydration. Trend LFTs Repeat chemistry panel in the morning.   Prediabetes Last hemoglobin A1c 6.2 on 12/23/2021 Insulin sliding scale  Ambulatory dysfunction At baseline ambulates with a cane PT OT to assess Fall precautions     Critical care time: 65 minutes.      DVT prophylaxis: Subcu Lovenox daily      Code Status: Full code  Family Communication: None at bedside  Disposition Plan: Likely will discharge to home with home health services   Consultants: None  Procedures: None   Antimicrobials: Rocephin   Status is: Inpatient The patient requires at least 2 midnights for further evaluation and treatment of present condition.    Objective: Vitals:   12/24/21 0800 12/24/21 0813 12/24/21 0900 12/24/21 1000  BP: (!) 153/47  110/73 (!) 129/101  Pulse: 69  72 87  Resp: 19  14 16   Temp:  97.9 F (36.6 C)    TempSrc:  Oral    SpO2: 94%  93% (!) 89%  Weight:      Height:        Intake/Output Summary (Last 24 hours)  at 12/24/2021 1238 Last data filed at 12/24/2021 3810 Gross per 24 hour  Intake 4366.58 ml  Output 1275 ml  Net 3091.58 ml   Filed Weights   12/24/21 0005  Weight: 122.1 kg    Exam:  General: 61 y.o. year-old male well developed well nourished in no  acute distress.  Alert and oriented x3. Cardiovascular: Regular rate and rhythm with no rubs or gallops.  No thyromegaly or JVD noted.   Respiratory: Clear to auscultation with no wheezes or rales. Good inspiratory effort. Abdomen: Soft nontender nondistended with normal bowel sounds x4 quadrants. Musculoskeletal: Right knee with mild joint effusion Skin: No ulcerative lesions noted or rashes, Psychiatry: Mood is appropriate for condition and setting   Data Reviewed: CBC: Recent Labs  Lab 12/23/21 1604 12/24/21 0309  WBC 13.5* 12.5*  NEUTROABS 11.1*  --   HGB 20.5* 19.3*  HCT 61.8* 57.4*  MCV 101.1* 101.2*  PLT 141* 119*   Basic Metabolic Panel: Recent Labs  Lab 12/23/21 1520 12/23/21 1604 12/23/21 2002 12/24/21 0309 12/24/21 1127  NA  --  150* 150* 148*  148* 145  K  --  4.3 4.2 4.0  4.0 3.9  CL  --  114* 119* 111  112* 103  CO2  --  23 25 30  29 29   GLUCOSE  --  164* 117* 111*  112* 119*  BUN  --  80* 70* 64*  65* 52*  CREATININE  --  2.27* 1.78* 1.31*  1.37* 1.14  CALCIUM  --  9.7 8.0* 8.8*  8.9 8.6*  MG 3.3*  --   --   --   --   PHOS 2.7  --   --   --   --    GFR: Estimated Creatinine Clearance: 90.7 mL/min (by C-G formula based on SCr of 1.14 mg/dL). Liver Function Tests: Recent Labs  Lab 12/23/21 1604 12/24/21 0309  AST 161* 193*  ALT 60* 69*  ALKPHOS 91 73  BILITOT 2.3* 1.6*  PROT 7.4 6.3*  ALBUMIN 4.1 3.4*   No results for input(s): "LIPASE", "AMYLASE" in the last 168 hours. Recent Labs  Lab 12/23/21 1900  AMMONIA 37*   Coagulation Profile: Recent Labs  Lab 12/23/21 1520  INR 1.1   Cardiac Enzymes: Recent Labs  Lab 12/23/21 1604 12/23/21 2220 12/24/21 0309  CKTOTAL 8,691* 9,562* 8,245*   BNP (last 3 results) No results for input(s): "PROBNP" in the last 8760 hours. HbA1C: Recent Labs    12/23/21 1604  HGBA1C 6.2*   CBG: Recent Labs  Lab 12/23/21 1937 12/24/21 0014 12/24/21 0301 12/24/21 0745 12/24/21 1233   GLUCAP 122* 133* 118* 127* 128*   Lipid Profile: No results for input(s): "CHOL", "HDL", "LDLCALC", "TRIG", "CHOLHDL", "LDLDIRECT" in the last 72 hours. Thyroid Function Tests: Recent Labs    12/23/21 1520  TSH 0.591   Anemia Panel: No results for input(s): "VITAMINB12", "FOLATE", "FERRITIN", "TIBC", "IRON", "RETICCTPCT" in the last 72 hours. Urine analysis:    Component Value Date/Time   COLORURINE AMBER (A) 12/23/2021 1900   APPEARANCEUR CLOUDY (A) 12/23/2021 1900   LABSPEC 1.017 12/23/2021 1900   PHURINE 5.0 12/23/2021 1900   GLUCOSEU >=500 (A) 12/23/2021 1900   HGBUR LARGE (A) 12/23/2021 1900   BILIRUBINUR NEGATIVE 12/23/2021 1900   KETONESUR 5 (A) 12/23/2021 1900   PROTEINUR 100 (A) 12/23/2021 1900   UROBILINOGEN 0.2 12/21/2012 1801   NITRITE NEGATIVE 12/23/2021 1900   LEUKOCYTESUR TRACE (A) 12/23/2021 1900   Sepsis Labs: @LABRCNTIP (procalcitonin:4,lacticidven:4)  )  Recent Results (from the past 240 hour(s))  MRSA Next Gen by PCR, Nasal     Status: None   Collection Time: 12/23/21 11:47 PM   Specimen: Nasal Mucosa; Nasal Swab  Result Value Ref Range Status   MRSA by PCR Next Gen NOT DETECTED NOT DETECTED Final    Comment: (NOTE) The GeneXpert MRSA Assay (FDA approved for NASAL specimens only), is one component of a comprehensive MRSA colonization surveillance program. It is not intended to diagnose MRSA infection nor to guide or monitor treatment for MRSA infections. Test performance is not FDA approved in patients less than 79 years old. Performed at [facility], [address] [place], [place] [address]       Studies: CT Head Wo Contrast  Result Date: 12/23/2021 CLINICAL DATA:  Head trauma, abnormal mental status. Patient found on floor. Generalized weakness. EXAM: CT HEAD WITHOUT CONTRAST TECHNIQUE: Contiguous axial images were obtained from the base of the skull through the vertex without intravenous contrast. RADIATION DOSE  REDUCTION: This exam was performed according to the departmental dose-optimization program which includes automated exposure control, adjustment of the mA and/or kV according to patient size and/or use of iterative reconstruction technique. 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 Encephalomalacia of the right greater than left anterior inferior frontal lobes noted. Right-sided changes extend more superiorly. This is consistent with the areas previously noted. Chronic encephalomalacia is present in the more posterior right frontal parietal lobe near the vertex. No acute infarct, hemorrhage, or mass lesion is present. Basal ganglia are intact. Insular ribbon is normal. No acute or focal cortical abnormalities are present. The brainstem and cerebellum are within normal limits. Vascular: Aneurysm coil noted. No hyperdense vessel or other unexpected calcifications. Skull: Calvarium is intact. No focal lytic or blastic lesions are present. No significant extracranial soft tissue lesion is present. Sinuses/Orbits: The paranasal sinuses and mastoid air cells are clear. The globes and orbits are within normal limits. IMPRESSION: 1. No acute intracranial abnormality or significant interval change. 2. Encephalomalacia of the anterior inferior frontal lobes bilaterally, right greater than left. This is consistent with the areas previously noted. 3. Chronic encephalomalacia in the more posterior right frontal parietal lobe near the vertex. 4. 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 Electronically Signed   By: Marin Roberts M.D.   On: 12/23/2021 16:35   DG Chest Portable 1 View  Result Date: 12/23/2021 CLINICAL DATA:  Weakness. Found on floor for unknown amount of time. EXAM: PORTABLE CHEST 1 VIEW COMPARISON:  AP chest 12/13/2012 FINDINGS: Interval removal of right internal jugular  central venous catheter compared to prior remote comparison study. Cardiac silhouette and mediastinal contours are within normal limits. Mild bibasilar horizontal linear scarring is similar to prior. Right infrahilar heterogeneous airspace opacification is resolved. No definite focal airspace opacity to indicate pneumonia. No pleural effusion or pneumothorax. No acute skeletal abnormality. IMPRESSION: Mild bibasilar horizontal linear scarring.  No acute lung process. Electronically Signed   By: Neita Garnet M.D.   On: 12/23/2021 16:26    Scheduled Meds:  Chlorhexidine Gluconate Cloth  6 each Topical Daily   insulin aspart  0-9 Units Subcutaneous Q4H   mouth rinse  15 mL Mouth Rinse BID    Continuous Infusions:  cefTRIAXone (ROCEPHIN)  IV Stopped (12/23/21 2254)   sodium bicarbonate 150 mEq in sterile water 1,150 mL infusion 150 mL/hr at 12/24/21 1018  LOS: 1 day     Darlin Drop, MD [facility] Pager [phone]  If 7PM-7AM, please contact night-coverage [email or web address] Password [facility] 12/24/2021, 12:38 PM

## 2021-12-25 DIAGNOSIS — R4182 Altered mental status, unspecified: Secondary | ICD-10-CM | POA: Diagnosis not present

## 2021-12-25 DIAGNOSIS — R778 Other specified abnormalities of plasma proteins: Secondary | ICD-10-CM | POA: Diagnosis not present

## 2021-12-25 DIAGNOSIS — E860 Dehydration: Secondary | ICD-10-CM | POA: Diagnosis not present

## 2021-12-25 LAB — COMPREHENSIVE METABOLIC PANEL
ALT: 59 U/L — ABNORMAL HIGH (ref 0–44)
AST: 108 U/L — ABNORMAL HIGH (ref 15–41)
Albumin: 2.9 g/dL — ABNORMAL LOW (ref 3.5–5.0)
Alkaline Phosphatase: 66 U/L (ref 38–126)
Anion gap: 10 (ref 5–15)
BUN: 29 mg/dL — ABNORMAL HIGH (ref 8–23)
CO2: 32 mmol/L (ref 22–32)
Calcium: 8.6 mg/dL — ABNORMAL LOW (ref 8.9–10.3)
Chloride: 96 mmol/L — ABNORMAL LOW (ref 98–111)
Creatinine, Ser: 0.9 mg/dL (ref 0.61–1.24)
GFR, Estimated: >60 mL/min (ref >60)
Glucose, Bld: 98 mg/dL (ref 70–99)
Potassium: 3.2 mmol/L — ABNORMAL LOW (ref 3.5–5.1)
Sodium: 138 mmol/L (ref 135–145)
Total Bilirubin: 1.5 mg/dL — ABNORMAL HIGH (ref 0.3–1.2)
Total Protein: 5.7 g/dL — ABNORMAL LOW (ref 6.5–8.1)

## 2021-12-25 LAB — GLUCOSE, CAPILLARY
Glucose-Capillary: 104 mg/dL — ABNORMAL HIGH (ref 70–99)
Glucose-Capillary: 112 mg/dL — ABNORMAL HIGH (ref 70–99)
Glucose-Capillary: 121 mg/dL — ABNORMAL HIGH (ref 70–99)
Glucose-Capillary: 122 mg/dL — ABNORMAL HIGH (ref 70–99)
Glucose-Capillary: 126 mg/dL — ABNORMAL HIGH (ref 70–99)
Glucose-Capillary: 140 mg/dL — ABNORMAL HIGH (ref 70–99)

## 2021-12-25 LAB — MAGNESIUM: Magnesium: 2 mg/dL (ref 1.7–2.4)

## 2021-12-25 LAB — CK: Total CK: 2202 U/L — ABNORMAL HIGH (ref 49–397)

## 2021-12-25 LAB — PHOSPHORUS: Phosphorus: 2.1 mg/dL — ABNORMAL LOW (ref 2.5–4.6)

## 2021-12-25 MED ORDER — CHLORHEXIDINE GLUCONATE CLOTH 2 % EX PADS
6.0000 | MEDICATED_PAD | Freq: Every day | CUTANEOUS | Status: DC
  Administered 2021-12-25 – 2021-12-26 (×2): 6 via TOPICAL

## 2021-12-25 MED ORDER — CHLORHEXIDINE GLUCONATE CLOTH 2 % EX PADS: 6.0000 | MEDICATED_PAD | Freq: Every day | CUTANEOUS | Status: DC

## 2021-12-25 MED ORDER — POTASSIUM CHLORIDE CRYS ER 20 MEQ PO TBCR
40.0000 meq | EXTENDED_RELEASE_TABLET | Freq: Two times a day (BID) | ORAL | Status: AC
Start: 2021-12-25 — End: 2021-12-25
  Administered 2021-12-25 (×2): 40 meq via ORAL
  Filled 2021-12-25 (×2): qty 2

## 2021-12-25 NOTE — Hospital Course (Signed)
61 y.o. male with medical history significant of tobacco abuse, HTN,subarachnoid bleed, DVT not on anticoagulation, ambulatory dysfunction with frequent falls, uses a cane, who presented via EMS with altered mental status after being found by family member on the floor for unknown period of time at his home.  Work-up in the ED revealed noncontrast CT nonacute, presumed UTI, AKI and rhabdomyolysis.  Started on bicarb drip and empiric IV antibiotics Rocephin, on admission.

## 2021-12-25 NOTE — Progress Notes (Signed)
Pt refused cpap tonight.  Pt stated he doesn't use his machine at home.  Pt prefers to keep nasal cannula on instead tonight.  Pt was advised that RT is available all night should he change his mind.  RN aware.

## 2021-12-25 NOTE — Plan of Care (Signed)

## 2021-12-25 NOTE — Progress Notes (Signed)
  Progress Note   Patient: Dennis Wyatt HTD:[id number] DOB: [birth year] DOA: 12/23/2021     2 DOS: the patient was seen and examined on 12/25/2021   Brief hospital course: 61 y.o. male with medical history significant of tobacco abuse, HTN,subarachnoid bleed, DVT not on anticoagulation, ambulatory dysfunction with frequent falls, uses a cane, who presented via EMS with altered mental status after being found by family member on the floor for unknown period of time at his home.  Work-up in the ED revealed noncontrast CT nonacute, presumed UTI, AKI and rhabdomyolysis.  Started on bicarb drip and empiric IV antibiotics Rocephin, on admission.  Assessment and Plan: No notes have been filed under this hospital service. Service: Hospitalist Acute metabolic encephalopathy, likely multifactorial secondary to dehydration, possible UTI. Seems improving Treat underlying conditions Reorient as needed CT head with no acute findings, revealing encephalomalacia from previous intracranial bleed.   Presumptive UTI, POA. Urine culture without significant growth thus far Continued on empiric IV Rocephin on admission.   Rhabdomyolysis Was found on the floor for unknown period of time Presenting CPK, peaked at 9562 CK down to 2,202   Elevated troponin, suspect demand ischemia in the setting of severe dehydration, AKI, rhabdomyolysis. Denies any anginal symptoms. High-sensitivity troponin peaked at 42 and down trended No evidence of acute ischemia on 12 lead EKG 2d echo reviewed. No WMA. Normal LVEF   Frequent PVCs with activity Optimize magnesium and potassium levels 2d echo per above   Resolved AKI Baseline creatinine appears to be 1.1 with GFR greater than 60 Presented with creatinine of 2.27 with GFR of 32. Continue to avoid nephrotoxic agents, dehydration and hypotension. Continue strict I's and O's to monitor urine output. Repeat renal panel in the morning.   Resolved hypovolemic  hypernatremia Presented with serum sodium of 150 Resolved with IV fluid hydration Encourage oral intake as tolerated   Elevated liver chemistries In the setting of rhabdomyolysis and dehydration. LFT's trending down Repeat chemistry panel in the morning.   Prediabetes Last hemoglobin A1c 6.2 on 12/23/2021 Insulin sliding scale   Ambulatory dysfunction At baseline ambulates with a cane PT OT consulted Fall precautions      Subjective: Without complaints  Physical Exam: Vitals:   12/25/21 1539 12/25/21 1600 12/25/21 1700 12/25/21 1800  BP:  (!) 136/57 (!) 176/85 (!) 159/69  Pulse:  (!) 58 (!) 49 61  Resp:  14 16 19   Temp: 98 F (36.7 C)     TempSrc: Oral     SpO2:  93% 94% 92%  Weight:      Height:       General exam: Awake, laying in bed, in nad Respiratory system: Normal respiratory effort, no wheezing Cardiovascular system: regular rate, s1, s2 Gastrointestinal system: Soft, nondistended, positive BS Central nervous system: CN2-12 grossly intact, strength intact Extremities: Perfused, no clubbing Skin: Normal skin turgor, no notable skin lesions seen Psychiatry: Mood normal // no visual hallucinations   Data Reviewed:  Labs reviewed: K3.2, Cr 0.9  Family Communication: Pt in room, family not at bedside  Disposition: Status is: Inpatient Remains inpatient appropriate because: Severity of illness  Planned Discharge Destination: Home    Author: , MD 12/25/2021 6:57 PM  For on call review [email or web address].

## 2021-12-25 NOTE — Progress Notes (Signed)
Nutrition Brief Note  Consult received from ED MD for assessment of nutrition requirements and status. MST score of 0.  Wt Readings from Last 15 Encounters:  12/24/21 122.1 kg  08/06/16 129.6 kg  08/01/16 129.6 kg  03/15/14 126.9 kg  11/15/13 122.4 kg  09/15/13 117.9 kg  06/29/13 112.7 kg  05/04/13 108 kg  03/09/13 104.8 kg  02/07/13 100.7 kg  12/31/12 97 kg  12/07/12 124.3 kg    Body mass index is 36.51 kg/m. Patient meets criteria for obesity based on current BMI. No weight information available in Care Everywhere. DTI to L buttock.   Patient admitted for rhabdomyolysis.  Current diet order is Carb Modified and patient ate 100% of all meals yesterday (~1760 kcal and 71 grams protein). Labs and medications reviewed.   No nutrition interventions warranted at this time. If nutrition issues arise, please re-consult RD.      Trenton Gammon, MS, RD, LDN Registered Dietitian II Inpatient Clinical Nutrition RD pager # and on-call/weekend pager # available in [facility]

## 2021-12-26 DIAGNOSIS — R7989 Other specified abnormal findings of blood chemistry: Secondary | ICD-10-CM | POA: Diagnosis not present

## 2021-12-26 DIAGNOSIS — R4020 Unspecified coma: Secondary | ICD-10-CM | POA: Diagnosis not present

## 2021-12-26 LAB — COMPREHENSIVE METABOLIC PANEL
ALT: 53 U/L — ABNORMAL HIGH (ref 0–44)
AST: 79 U/L — ABNORMAL HIGH (ref 15–41)
Albumin: 2.8 g/dL — ABNORMAL LOW (ref 3.5–5.0)
Alkaline Phosphatase: 62 U/L (ref 38–126)
Anion gap: 8 (ref 5–15)
BUN: 22 mg/dL (ref 8–23)
CO2: 30 mmol/L (ref 22–32)
Calcium: 8.5 mg/dL — ABNORMAL LOW (ref 8.9–10.3)
Chloride: 99 mmol/L (ref 98–111)
Creatinine, Ser: 0.69 mg/dL (ref 0.61–1.24)
GFR, Estimated: >60 mL/min (ref >60)
Glucose, Bld: 110 mg/dL — ABNORMAL HIGH (ref 70–99)
Potassium: 3.9 mmol/L (ref 3.5–5.1)
Sodium: 137 mmol/L (ref 135–145)
Total Bilirubin: 1.2 mg/dL (ref 0.3–1.2)
Total Protein: 5.7 g/dL — ABNORMAL LOW (ref 6.5–8.1)

## 2021-12-26 LAB — CBC
HCT: 52.9 % — ABNORMAL HIGH (ref 39.0–52.0)
Hemoglobin: 17.6 g/dL — ABNORMAL HIGH (ref 13.0–17.0)
MCH: 33.3 pg (ref 26.0–34.0)
MCHC: 33.3 g/dL (ref 30.0–36.0)
MCV: 100 fL (ref 80.0–100.0)
Platelets: 103 10*3/uL — ABNORMAL LOW (ref 150–400)
RBC: 5.29 MIL/uL (ref 4.22–5.81)
RDW: 12.8 % (ref 11.5–15.5)
WBC: 7.7 10*3/uL (ref 4.0–10.5)
nRBC: 0 % (ref 0.0–0.2)

## 2021-12-26 LAB — GLUCOSE, CAPILLARY
Glucose-Capillary: 115 mg/dL — ABNORMAL HIGH (ref 70–99)
Glucose-Capillary: 95 mg/dL (ref 70–99)
Glucose-Capillary: 111 mg/dL — ABNORMAL HIGH (ref 70–99)
Glucose-Capillary: 125 mg/dL — ABNORMAL HIGH (ref 70–99)

## 2021-12-26 NOTE — Progress Notes (Signed)
SATURATION QUALIFICATIONS: (This note is used to comply with regulatory documentation for home oxygen)  Patient Saturations on Room Air at Rest = 90%  Patient Saturations on Room Air while Ambulating = 94%    Please briefly explain why patient needs home oxygen: Patient SpO2 increased with mobility from 88-90% to 92-95% on RA. He reported SOB of 2/4 with activity. Ambulated ~200' with no distress and sufficient sats on RA. No supplemental O2 needs.  Wynn Maudlin, DPT Acute Rehabilitation Services Office [phone] Pager [phone]  12/26/21 2:21 PM

## 2021-12-26 NOTE — Progress Notes (Signed)
Discharge education provided to patient. Patient states he has two wheelchairs at home. Patient being picked up by niece, Crystal. PIVs removed, belongings returned. Patient transported to car by nursing staff via wheelchair. Assisted into car safely.

## 2021-12-26 NOTE — Progress Notes (Signed)
Physical Therapy Treatment Patient Details Name: Dennis Wyatt MRN: [id number] DOB: 1961/02/14 Today's Date: 12/26/2021   History of Present Illness Patient is a 61 year old male who presented to the hosptial after being found on the floor for unknown ammount of time on the floor with increased confusion. patient was found to have rhabdomyolysis, AKI, AMS, UTI, and acute respiratory failure. PMH: L THR, SAH 2*aneurysm    PT Comments    Pt is progressing well with therapy. He ambulated 200 ft in hallway with Min assist and chair follow for safety. Pt required 3 standing breaks and reported 2/4 for DOE scale. Monitor alerted for nonsustained V-tach that resolved in seconds, pt asymptomatic and HR in 100's-120's. Currently pt requires Min assist for transfers and gait with RW. Pt will benefit from continued skilled therapy. Will progress as able.   Recommendations for follow up therapy are one component of a multi-disciplinary discharge planning process, led by the attending physician.  Recommendations may be updated based on patient status, additional functional criteria and insurance authorization.  Follow Up Recommendations  Home health PT     Assistance Recommended at Discharge Frequent or constant Supervision/Assistance  Patient can return home with the following Two people to help with walking and/or transfers;A little help with bathing/dressing/bathroom;Assistance with cooking/housework;Direct supervision/assist for medications management;Direct supervision/assist for financial management;Assist for transportation;Help with stairs or ramp for entrance   Equipment Recommendations  Rolling walker (2 wheels)    Recommendations for Other Services       Precautions / Restrictions Precautions Precautions: Fall Precaution Comments: RLE weakness Restrictions Weight Bearing Restrictions: No     Mobility  Bed Mobility               General bed mobility comments: oob in recliner     Transfers Overall transfer level: Needs assistance Equipment used: Rolling walker (2 wheels) Transfers: Sit to/from Stand Sit to Stand: Min assist, +2 safety/equipment           General transfer comment: Pt required assist to complete rise in standing, VC's for hand placement.    Ambulation/Gait Ambulation/Gait assistance: Min assist, +2 safety/equipment Gait Distance (Feet): 200 Feet Assistive device: Rolling walker (2 wheels) Gait Pattern/deviations: Step-through pattern, Decreased stance time - left, Trunk flexed Gait velocity: decr     General Gait Details: Pt needed VC's "look straight ahead" when head consistenly looking down while ambulating. Pt required min assist with chair follow for safety. Monitor alarmed, nonsustained V tach, resolved quickly, pt asymptomatic, suspect artifact while pt ambulating,instructed to pause in standing for 30-60 sec for recovery.SPO2 92-95%. HR 100-120.   Stairs             Wheelchair Mobility    Modified Rankin (Stroke Patients Only)       Balance Overall balance assessment: Needs assistance Sitting-balance support: Feet supported, No upper extremity supported Sitting balance-Leahy Scale: Good Sitting balance - Comments: Pt demonstrated balance in sitting while moving bil arms   Standing balance support: Bilateral upper extremity supported, During functional activity, Reliant on assistive device for balance Standing balance-Leahy Scale: Fair Standing balance comment: Pt relied on bil UE support and RW.                            Cognition Arousal/Alertness: Awake/alert Behavior During Therapy: WFL for tasks assessed/performed Overall Cognitive Status: Within Functional Limits for tasks assessed  General Comments: Pt enjoys engaging with conversation        Exercises      General Comments        Pertinent Vitals/Pain      Home Living                           Prior Function            PT Goals (current goals can now be found in the care plan section) Acute Rehab PT Goals Patient Stated Goal: regain PLOF/ind PT Goal Formulation: With patient Time For Goal Achievement: 01/07/22 Potential to Achieve Goals: Good Progress towards PT goals: Progressing toward goals    Frequency    Min 3X/week      PT Plan Current plan remains appropriate    Co-evaluation              AM-PAC PT "6 Clicks" Mobility   Outcome Measure  Help needed turning from your back to your side while in a flat bed without using bedrails?: A Little Help needed moving from lying on your back to sitting on the side of a flat bed without using bedrails?: A Little Help needed moving to and from a bed to a chair (including a wheelchair)?: A Little Help needed standing up from a chair using your arms (e.g., wheelchair or bedside chair)?: A Little Help needed to walk in hospital room?: A Little Help needed climbing 3-5 steps with a railing? : A Little 6 Click Score: 18    End of Session Equipment Utilized During Treatment: Gait belt Activity Tolerance: Patient tolerated treatment well Patient left: in chair;with call bell/phone within reach;with chair alarm set Nurse Communication: Mobility status PT Visit Diagnosis: Unsteadiness on feet (R26.81);Other abnormalities of gait and mobility (R26.89);Repeated falls (R29.6);Muscle weakness (generalized) (M62.81)     Time: 1350-1416 PT Time Calculation (min) (ACUTE ONLY): 26 min  Charges:  $Gait Training: 23-37 mins                     Lenn Cal, SPT Acute Rehab [facility] 12/26/2021, 3:39 PM

## 2021-12-26 NOTE — Discharge Summary (Signed)
Physician Discharge Summary   Patient: Dennis Wyatt MRN: [id number] DOB: 14-Sep-1960  Admit date:     12/23/2021  Discharge date: 12/26/21  Discharge Physician: Rickey Barbara   PCP: [name], MD   Recommendations at discharge:    Follow up with PCP in 1-2 weeks Recommend arranging outpatient sleep study, concerns for underlying OSA  Discharge Diagnoses: Principal Problem:   Rhabdomyolysis Active Problems:   Acute respiratory failure (HCC)   Altered mental status   Tobacco abuse   AKI (acute kidney injury) (HCC)   Elevated LFTs   Hypernatremia   Dehydration   DM2 (diabetes mellitus, type 2) (HCC)   UTI (urinary tract infection)   Elevated troponin  Resolved Problems:   * No resolved hospital problems. *  Hospital Course: 61 y.o. male with medical history significant of tobacco abuse, HTN,subarachnoid bleed, DVT not on anticoagulation, ambulatory dysfunction with frequent falls, uses a cane, who presented via EMS with altered mental status after being found by family member on the floor for unknown period of time at his home.  Work-up in the ED revealed noncontrast CT nonacute, presumed UTI, AKI and rhabdomyolysis.  Started on bicarb drip and empiric IV antibiotics Rocephin, on admission.  Assessment and Plan: No notes have been filed under this hospital service. Service: Hospitalist  Acute metabolic encephalopathy, likely multifactorial secondary to dehydration, possible UTI. Much improved CT head with no acute findings, revealing encephalomalacia from previous intracranial bleed. Recommend setting up outpatient sleep study to r/o OSA   Presumptive UTI, POA. Urine culture without significant growth thus far Completed empiric 3 days of rocephin   Rhabdomyolysis Was found on the floor for unknown period of time Presenting CPK, peaked at 9562 CK trended down   Elevated troponin, suspect demand ischemia in the setting of severe dehydration, AKI,  rhabdomyolysis. Denies any anginal symptoms. High-sensitivity troponin peaked at 42 and down trended No evidence of acute ischemia on 12 lead EKG 2d echo reviewed. No WMA. Normal LVEF   Frequent PVCs with activity Optimize magnesium and potassium levels 2d echo per above   Resolved AKI Baseline creatinine appears to be 1.1 with GFR greater than 60 Presented with creatinine of 2.27 with GFR of 32. Cr normalized with hydration   Resolved hypovolemic hypernatremia Presented with serum sodium of 150 Resolved with IV fluid hydration   Elevated liver chemistries In the setting of rhabdomyolysis and dehydration. LFT's trending down   Prediabetes Last hemoglobin A1c 6.2 on 12/23/2021 Insulin sliding scale while in hospital   Ambulatory dysfunction At baseline ambulates with a cane PT OT consulted, recs for HHPTOT  HTN -BP remained stable this visit -Held home metoprolol on d/c secondary to bouts of bradycardia into the 50-60's       Consultants:  Procedures performed:   Disposition: Home Diet recommendation:  Carb modified diet DISCHARGE MEDICATION: Allergies as of 12/26/2021       Reactions   Oxycodone    itch        Medication List     STOP taking these medications    metoprolol succinate 50 MG 24 hr tablet Commonly known as: TOPROL-XL       TAKE these medications    aspirin 325 MG tablet Take 325 mg by mouth daily.   gabapentin 600 MG tablet Commonly known as: NEURONTIN Take 600 mg by mouth 2 (two) times daily.   lisinopril-hydrochlorothiazide 10-12.5 MG tablet Commonly known as: ZESTORETIC Take 1 tablet by mouth daily.   traZODone 100 MG tablet  Commonly known as: DESYREL Take 200 mg by mouth at bedtime as needed.   Trulicity 0.75 MG/0.5ML Sopn Generic drug: Dulaglutide Inject 0.75 mg into the skin once a week.        Follow-up Information     [name], MD Follow up in 2 week(s).   Specialty: Family Medicine Why:  Hospital follow up Contact information: [address] [place] [address] [phone]                Discharge Exam: Ceasar Mons Weights   12/24/21 0005  Weight: 122.1 kg   General exam: Awake, laying in bed, in nad Respiratory system: Normal respiratory effort, no wheezing Cardiovascular system: regular rate, s1, s2 Gastrointestinal system: Soft, nondistended, positive BS Central nervous system: CN2-12 grossly intact, strength intact Extremities: Perfused, no clubbing Skin: Normal skin turgor, no notable skin lesions seen Psychiatry: Mood normal // no visual hallucinations   Condition at discharge: fair  The results of significant diagnostics from this hospitalization (including imaging, microbiology, ancillary and laboratory) are listed below for reference.   Imaging Studies: ECHOCARDIOGRAM COMPLETE  Result Date: 12/24/2021    ECHOCARDIOGRAM REPORT   Patient Name:   Dennis Wyatt Date of Exam: 12/24/2021 Medical Rec #:  [id number]     Height:       72.0 in Accession #:    [id number]    Weight:       269.2 lb Date of Birth:  Sep 22, 1960     BSA:          2.417 m Patient Age:    60 years      BP:           129/101 mmHg Patient Gender: M             HR:           81 bpm. Exam Location:  Inpatient Procedure: 2D Echo, Cardiac Doppler, Color Doppler and Intracardiac            Opacification Agent Indications:    abn ekg  History:        Patient has no prior history of Echocardiogram examinations.                 Tobacco abuse.  Sonographer:    Rodrigo Ran RCS Referring Phys: [phone] ANASTASSIA DOUTOVA  Sonographer Comments: No parasternal window. Image acquisition challenging due to patient body habitus. IMPRESSIONS  1. Technically difficult study with limited views. Left ventricular ejection fraction, by estimation, is 60 to 65%. The left ventricle has normal function. The left ventricle has no regional wall motion abnormalities. Left ventricular diastolic parameters are  indeterminate.  2. Right ventricular systolic function is normal. The right ventricular size is normal. The estimated right ventricular systolic pressure is 29.6 mmHg.  3. The mitral valve is normal in structure. No evidence of mitral valve regurgitation. No evidence of mitral stenosis.  4. The aortic valve is tricuspid. Aortic valve regurgitation is not visualized. No aortic stenosis is present.  5. The inferior vena cava is dilated in size with <50% respiratory variability, suggesting right atrial pressure of 15 mmHg. FINDINGS  Left Ventricle: Left ventricular ejection fraction, by estimation, is 60 to 65%. The left ventricle has normal function. The left ventricle has no regional wall motion abnormalities. Definity contrast agent was given IV to delineate the left ventricular  endocardial borders. The left ventricular internal cavity size was normal in size. There is no left ventricular hypertrophy. Left ventricular diastolic parameters  are indeterminate. Right Ventricle: The right ventricular size is normal. No increase in right ventricular wall thickness. Right ventricular systolic function is normal. The tricuspid regurgitant velocity is 1.91 m/s, and with an assumed right atrial pressure of 15 mmHg, the estimated right ventricular systolic pressure is 29.6 mmHg. Left Atrium: Left atrial size was normal in size. Right Atrium: Right atrial size was normal in size. Pericardium: Trivial pericardial effusion is present. Mitral Valve: The mitral valve is normal in structure. No evidence of mitral valve regurgitation. No evidence of mitral valve stenosis. Tricuspid Valve: The tricuspid valve is normal in structure. Tricuspid valve regurgitation is not demonstrated. Aortic Valve: The aortic valve is tricuspid. Aortic valve regurgitation is not visualized. No aortic stenosis is present. Aortic valve mean gradient measures 7.0 mmHg. Aortic valve peak gradient measures 12.2 mmHg. Aortic valve area, by VTI measures 3.09   cm. Pulmonic Valve: The pulmonic valve was not well visualized. Pulmonic valve regurgitation is not visualized. Aorta: The aortic root is normal in size and structure. Venous: The inferior vena cava is dilated in size with less than 50% respiratory variability, suggesting right atrial pressure of 15 mmHg. IAS/Shunts: The interatrial septum was not well visualized.  LEFT VENTRICLE PLAX 2D LVIDd:         5.70 cm   Diastology LVIDs:         3.80 cm   LV e' medial:    8.70 cm/s LV PW:         0.80 cm   LV E/e' medial:  9.1 LV IVS:        0.90 cm   LV e' lateral:   10.00 cm/s LVOT diam:     2.50 cm   LV E/e' lateral: 7.9 LV SV:         98 LV SV Index:   41 LVOT Area:     4.91 cm  RIGHT VENTRICLE          IVC RV Basal diam:  3.90 cm  IVC diam: 2.40 cm RV Mid diam:    2.70 cm LEFT ATRIUM             Index        RIGHT ATRIUM           Index LA diam:        4.00 cm 1.65 cm/m   RA Area:     14.20 cm LA Vol (A2C):   40.2 ml 16.63 ml/m  RA Volume:   36.70 ml  15.18 ml/m LA Vol (A4C):   33.3 ml 13.78 ml/m LA Biplane Vol: 37.2 ml 15.39 ml/m  AORTIC VALVE                     PULMONIC VALVE AV Area (Vmax):    2.86 cm      PV Vmax:       1.65 m/s AV Area (Vmean):   2.81 cm      PV Peak grad:  10.9 mmHg AV Area (VTI):     3.09 cm AV Vmax:           175.00 cm/s AV Vmean:          119.000 cm/s AV VTI:            0.318 m AV Peak Grad:      12.2 mmHg AV Mean Grad:      7.0 mmHg LVOT Vmax:         102.00 cm/s LVOT Vmean:  68.100 cm/s LVOT VTI:          0.200 m LVOT/AV VTI ratio: 0.63  AORTA Ao Root diam: 3.50 cm MITRAL VALVE               TRICUSPID VALVE MV Area (PHT): 3.60 cm    TR Peak grad:   14.6 mmHg MV Decel Time: 211 msec    TR Vmax:        191.00 cm/s MV E velocity: 78.80 cm/s MV A velocity: 84.00 cm/s  SHUNTS MV E/A ratio:  0.94        Systemic VTI:  0.20 m                            Systemic Diam: 2.50 cm Oswaldo Milian MD Electronically signed by Oswaldo Milian MD Signature Date/Time:  12/24/2021/4:53:55 PM    Final    CT Head Wo Contrast  Result Date: 12/23/2021 CLINICAL DATA:  Head trauma, abnormal mental status. Patient found on floor. Generalized weakness. EXAM: CT HEAD WITHOUT CONTRAST TECHNIQUE: Contiguous axial images were obtained from the base of the skull through the vertex without intravenous contrast. RADIATION DOSE REDUCTION: This exam was performed according to the departmental dose-optimization program which includes automated exposure control, adjustment of the mA and/or kV according to patient size and/or use of iterative reconstruction technique. 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 Encephalomalacia of the right greater than left anterior inferior frontal lobes noted. Right-sided changes extend more superiorly. This is consistent with the areas previously noted. Chronic encephalomalacia is present in the more posterior right frontal parietal lobe near the vertex. No acute infarct, hemorrhage, or mass lesion is present. Basal ganglia are intact. Insular ribbon is normal. No acute or focal cortical abnormalities are present. The brainstem and cerebellum are within normal limits. Vascular: Aneurysm coil noted. No hyperdense vessel or other unexpected calcifications. Skull: Calvarium is intact. No focal lytic or blastic lesions are present. No significant extracranial soft tissue lesion is present. Sinuses/Orbits: The paranasal sinuses and mastoid air cells are clear. The globes and orbits are within normal limits. IMPRESSION: 1. No acute intracranial abnormality or significant interval change. 2. Encephalomalacia of the anterior inferior frontal lobes bilaterally, right greater than left. This is consistent with the areas previously noted. 3. Chronic encephalomalacia in the more posterior right frontal parietal lobe near the vertex. 4. Aneurysm coil mass of treated  anterior communicating artery aneurysm. Electronically Signed   By: San Morelle M.D.   On: 12/23/2021 16:35   DG Chest Portable 1 View  Result Date: 12/23/2021 CLINICAL DATA:  Weakness. Found on floor for unknown amount of time. EXAM: PORTABLE CHEST 1 VIEW COMPARISON:  AP chest 12/13/2012 FINDINGS: Interval removal of right internal jugular central venous catheter compared to prior remote comparison study. Cardiac silhouette and mediastinal contours are within normal limits. Mild bibasilar horizontal linear scarring is similar to prior. Right infrahilar heterogeneous airspace opacification is resolved. No definite focal airspace opacity to indicate pneumonia. No pleural effusion or pneumothorax. No acute skeletal abnormality. IMPRESSION: Mild bibasilar horizontal linear scarring.  No acute lung process. Electronically Signed   By: Yvonne Kendall M.D.   On: 12/23/2021 16:26    Microbiology: Results for orders placed or performed during the hospital encounter of 12/23/21  Urine Culture     Status: Abnormal   Collection Time: 12/23/21  9:39 PM  Specimen: Urine, Clean Catch  Result Value Ref Range Status   Specimen Description   Final    URINE, CLEAN CATCH Performed at [facility], [place] [address], [place], [place] [address]    Special Requests   Final    NONE Performed at [facility], [place] [address] [place], [place] [address]    Culture (A)  Final    <10,000 COLONIES/mL INSIGNIFICANT GROWTH Performed at [place] [address], [place], [place] [address]    Report Status 12/24/2021 FINAL  Final  MRSA Next Gen by PCR, Nasal     Status: None   Collection Time: 12/23/21 11:47 PM   Specimen: Nasal Mucosa; Nasal Swab  Result Value Ref Range Status   MRSA by PCR Next Gen NOT DETECTED NOT DETECTED Final    Comment: (NOTE) The GeneXpert MRSA Assay (FDA approved for NASAL specimens only), is one component of a comprehensive MRSA  colonization surveillance program. It is not intended to diagnose MRSA infection nor to guide or monitor treatment for MRSA infections. Test performance is not FDA approved in patients less than 34 years old. Performed at [facility], [place] [address] [place],  [address]     Labs: CBC: Recent Labs  Lab 12/23/21 1604 12/24/21 0309 12/26/21 0326  WBC 13.5* 12.5* 7.7  NEUTROABS 11.1*  --   --   HGB 20.5* 19.3* 17.6*  HCT 61.8* 57.4* 52.9*  MCV 101.1* 101.2* 100.0  PLT 141* 119* [id number]*   Basic Metabolic Panel: Recent Labs  Lab 12/23/21 1520 12/23/21 1604 12/24/21 1127 12/24/21 1617 12/24/21 2154 12/25/21 0807 12/26/21 0326  NA  --    < > 145 143 142 138 137  K  --    < > 3.9 3.7 3.5 3.2* 3.9  CL  --    < > 103 101 97* 96* 99  CO2  --    < > 29 30 34* 32 30  GLUCOSE  --    < > 119* 112* 117* 98 110*  BUN  --    < > 52* 49* 40* 29* 22  CREATININE  --    < > 1.14 1.06 0.93 0.90 0.69  CALCIUM  --    < > 8.6* 8.6* 8.6* 8.6* 8.5*  MG 3.3*  --   --   --   --  2.0  --   PHOS 2.7  --   --   --   --  2.1*  --    < > = values in this interval not displayed.   Liver Function Tests: Recent Labs  Lab 12/23/21 1604 12/24/21 0309 12/25/21 0807 12/26/21 0326  AST 161* 193* 108* 79*  ALT 60* 69* 59* 53*  ALKPHOS 91 73 66 62  BILITOT 2.3* 1.6* 1.5* 1.2  PROT 7.4 6.3* 5.7* 5.7*  ALBUMIN 4.1 3.4* 2.9* 2.8*   CBG: Recent Labs  Lab 12/25/21 2346 12/26/21 0322 12/26/21 0731 12/26/21 1136 12/26/21 1540  GLUCAP 122* 115* 95 111* 125*    Discharge time spent: less than 30 minutes.  Signed: Marylu Lund, MD [facility] 12/26/2021

## 2023-05-18 ENCOUNTER — Encounter (HOSPITAL_COMMUNITY): Payer: Self-pay | Admitting: Emergency Medicine

## 2023-06-08 ENCOUNTER — Inpatient Hospital Stay: Payer: PPO

## 2023-06-08 ENCOUNTER — Encounter: Payer: Self-pay | Admitting: Hematology and Oncology

## 2023-06-08 ENCOUNTER — Inpatient Hospital Stay: Payer: PPO | Attending: Hematology and Oncology | Admitting: Hematology and Oncology

## 2023-06-08 VITALS — BP 160/93 | HR 99 | Ht 72.0 in | Wt 296.6 lb

## 2023-06-08 DIAGNOSIS — F17210 Nicotine dependence, cigarettes, uncomplicated: Secondary | ICD-10-CM | POA: Insufficient documentation

## 2023-06-08 DIAGNOSIS — I10 Essential (primary) hypertension: Secondary | ICD-10-CM | POA: Insufficient documentation

## 2023-06-08 DIAGNOSIS — D696 Thrombocytopenia, unspecified: Secondary | ICD-10-CM | POA: Insufficient documentation

## 2023-06-08 DIAGNOSIS — Z8673 Personal history of transient ischemic attack (TIA), and cerebral infarction without residual deficits: Secondary | ICD-10-CM | POA: Diagnosis not present

## 2023-06-08 DIAGNOSIS — D751 Secondary polycythemia: Secondary | ICD-10-CM | POA: Insufficient documentation

## 2023-06-08 DIAGNOSIS — Z7984 Long term (current) use of oral hypoglycemic drugs: Secondary | ICD-10-CM | POA: Diagnosis not present

## 2023-06-08 DIAGNOSIS — Z7982 Long term (current) use of aspirin: Secondary | ICD-10-CM | POA: Insufficient documentation

## 2023-06-08 DIAGNOSIS — Z86718 Personal history of other venous thrombosis and embolism: Secondary | ICD-10-CM | POA: Diagnosis not present

## 2023-06-08 DIAGNOSIS — E119 Type 2 diabetes mellitus without complications: Secondary | ICD-10-CM | POA: Insufficient documentation

## 2023-06-08 DIAGNOSIS — Z7985 Long-term (current) use of injectable non-insulin antidiabetic drugs: Secondary | ICD-10-CM | POA: Insufficient documentation

## 2023-06-08 DIAGNOSIS — Z79899 Other long term (current) drug therapy: Secondary | ICD-10-CM | POA: Diagnosis not present

## 2023-06-08 DIAGNOSIS — Z720 Tobacco use: Secondary | ICD-10-CM

## 2023-06-08 LAB — FERRITIN: Ferritin: 39 ng/mL (ref 24–336)

## 2023-06-08 LAB — CMP (CANCER CENTER ONLY)
ALT: 14 U/L (ref 0–44)
AST: 19 U/L (ref 15–41)
Albumin: 3.8 g/dL (ref 3.5–5.0)
Alkaline Phosphatase: 95 U/L (ref 38–126)
Anion gap: 5 (ref 5–15)
BUN: 15 mg/dL (ref 8–23)
CO2: 31 mmol/L (ref 22–32)
Calcium: 9.2 mg/dL (ref 8.9–10.3)
Chloride: 103 mmol/L (ref 98–111)
Creatinine: 0.92 mg/dL (ref 0.61–1.24)
GFR, Estimated: >60 mL/min (ref >60)
Glucose, Bld: 120 mg/dL — ABNORMAL HIGH (ref 70–99)
Potassium: 4.4 mmol/L (ref 3.5–5.1)
Sodium: 139 mmol/L (ref 135–145)
Total Bilirubin: 0.9 mg/dL (ref <1.2)
Total Protein: 6.7 g/dL (ref 6.5–8.1)

## 2023-06-08 LAB — CBC WITH DIFFERENTIAL/PLATELET
Abs Immature Granulocytes: 0.02 10*3/uL (ref 0.00–0.07)
Basophils Absolute: 0.1 10*3/uL (ref 0.0–0.1)
Basophils Relative: 1 %
Eosinophils Absolute: 0.2 10*3/uL (ref 0.0–0.5)
Eosinophils Relative: 2 %
HCT: 62.7 % — ABNORMAL HIGH (ref 39.0–52.0)
Hemoglobin: 20.9 g/dL — ABNORMAL HIGH (ref 13.0–17.0)
Immature Granulocytes: 0 %
Lymphocytes Relative: 14 %
Lymphs Abs: 1 10*3/uL (ref 0.7–4.0)
MCH: 35.4 pg — ABNORMAL HIGH (ref 26.0–34.0)
MCHC: 33.3 g/dL (ref 30.0–36.0)
MCV: 106.3 fL — ABNORMAL HIGH (ref 80.0–100.0)
Monocytes Absolute: 0.7 10*3/uL (ref 0.1–1.0)
Monocytes Relative: 10 %
Neutro Abs: 5.2 10*3/uL (ref 1.7–7.7)
Neutrophils Relative %: 73 %
Platelets: 133 10*3/uL — ABNORMAL LOW (ref 150–400)
RBC: 5.9 MIL/uL — ABNORMAL HIGH (ref 4.22–5.81)
RDW: 13.8 % (ref 11.5–15.5)
Smear Review: DECREASED
WBC: 7.2 10*3/uL (ref 4.0–10.5)
nRBC: 0 % (ref 0.0–0.2)

## 2023-06-08 LAB — URIC ACID: Uric Acid, Serum: 7.1 mg/dL (ref 3.7–8.6)

## 2023-06-08 NOTE — Assessment & Plan Note (Signed)
The patient likely have secondary erythrocytosis from tobacco abuse With his high hemoglobin and history of blood clots, he could be at high risk of recurrent thrombosis I will order additional workup recommend phlebotomy to keep his hemoglobin under 17 until further evaluation and he is in agreement He will continue aspirin therapy in the meantime

## 2023-06-08 NOTE — Assessment & Plan Note (Signed)
This is chronic, likely due to fatty liver Observe only

## 2023-06-08 NOTE — Assessment & Plan Note (Signed)
We discussed the importance of tobacco cessation The patient will try his best to quit

## 2023-06-08 NOTE — Progress Notes (Signed)
Dennis Wyatt CONSULT NOTE  Patient Care Team: Dennis Costa, NP as PCP - General (Nurse Practitioner)  ASSESSMENT & PLAN Erythrocytosis The patient likely have secondary erythrocytosis from tobacco abuse With his high hemoglobin and history of blood clots, he could be at high risk of recurrent thrombosis I will order additional workup recommend phlebotomy to keep his hemoglobin under 17 until further evaluation and he is in agreement He will continue aspirin therapy in the meantime  Tobacco abuse We discussed the importance of tobacco cessation The patient will try his best to quit  DM2 (diabetes mellitus, type 2) (HCC) The patient has suboptimal control of his diabetes, hypertension and has significant cardiovascular risk factors including tobacco abuse He is noted to have poor wound healing on his lower legs He will continue follow-up with his primary care doctor for management  Thrombocytopenia (HCC) This is chronic, likely due to fatty liver Observe only  Dennis Wyatt 06/08/2023 10:35 AM  The total time spent in the appointment was 80 minutes encounter with patients including review of chart and various tests results, discussions about plan of care and coordination of care plan   All questions were answered. The patient knows to call the clinic with any problems, questions or concerns. No barriers to learning was detected.   CHIEF COMPLAINTS/PURPOSE OF CONSULTATION:  Erythrocytosis  HISTORY OF PRESENTING ILLNESS:  Dennis Wyatt 62 y.o. male is here because of elevated hemoglobin and thrombocytopenia He is not a great historian He was almost an hour late for his appointment I have the opportunity to review his chart and referring notes The most recent lab that was sent to [place] was dated on March 04, 2023 Some of the pertinent labs including elevated RBC count of 6.23, platelet count 128, hemoglobin A1c of 6.7% and triglyceride of 450 The patient was  hospitalized last year.  His last CBC from the hospital showed significant erythrocytosis and mild thrombocytopenia with abnormal liver enzymes.  He has history of aneurysm but denies recent headaches.  He has poor circulation with significant bilateral lower extremity edema.  He thought that he is seeing me because of that.  He has remote history of DVT but currently not anticoagulated.  The patient is a smoker and currently smokes 1/2 pack of cigarettes per day for the last 50 years.  MEDICAL HISTORY:  Past Medical History:  Diagnosis Date   Arthritis    Clotting disorder (HCC)    Diabetes mellitus without complication (HCC)    GERD (gastroesophageal reflux disease)    Headache    Hypertension    Stroke (HCC) 11/2012   Tooth pain    uses BC powders for teeth pain    SURGICAL HISTORY: Past Surgical History:  Procedure Laterality Date   RADIOLOGY WITH ANESTHESIA N/A 12/03/2012   Procedure: RADIOLOGY WITH ANESTHESIA;  Surgeon: Oneal Grout, MD;  Location: [facility] OR;  Service: Radiology;  Laterality: N/A;   TOTAL HIP ARTHROPLASTY Left 08/06/2016   Procedure: LEFT TOTAL HIP ARTHROPLASTY ANTERIOR APPROACH;  Surgeon: Ollen Gross, MD;  Location: [facility] ORS;  Service: Orthopedics;  Laterality: Left;    SOCIAL HISTORY: Social History   Socioeconomic History   Marital status: Divorced    Spouse name: Not on file   Number of children: Not on file   Years of education: Not on file   Highest education level: Not on file  Occupational History   Occupation: disabled  Tobacco Use   Smoking status: Every Day  Current packs/day: 1.50    Types: Cigarettes   Smokeless tobacco: Never  Substance and Sexual Activity   Alcohol use: No   Drug use: No   Sexual activity: Not on file  Other Topics Concern   Not on file  Social History Narrative   Not on file   Social Determinants of Health   Financial Resource Strain: Not on file  Food Insecurity: Not on file  Transportation Needs: Not  on file  Physical Activity: Not on file  Stress: Not on file  Social Connections: Not on file  Intimate Partner Violence: Not on file    FAMILY HISTORY: Family History  Problem Relation Age of Onset   Heart disease Mother    Stroke Mother    Cancer Father    Heart disease Father    Hypertension Father     ALLERGIES:  is allergic to oxycodone.  MEDICATIONS:  Current Outpatient Medications  Medication Sig Dispense Refill   atorvastatin (LIPITOR) 20 MG tablet Take 20 mg by mouth daily.     FARXIGA 10 MG TABS tablet Take 10 mg by mouth daily.     metoprolol succinate (TOPROL-XL) 50 MG 24 hr tablet Take 50 mg by mouth daily.     aspirin 325 MG tablet Take 325 mg by mouth daily.     Dulaglutide (TRULICITY) 0.75 MG/0.5ML SOPN Inject 0.75 mg into the skin once a week.     gabapentin (NEURONTIN) 600 MG tablet Take 600 mg by mouth 2 (two) times daily.     lisinopril-hydrochlorothiazide (ZESTORETIC) 10-12.5 MG tablet Take 1 tablet by mouth daily.     traZODone (DESYREL) 100 MG tablet Take 100 mg by mouth at bedtime as needed for sleep.     No current facility-administered medications for this visit.    REVIEW OF SYSTEMS:   Constitutional: Denies fevers, chills or abnormal night sweats Eyes: Denies blurriness of vision, double vision or watery eyes Ears, nose, mouth, throat, and face: Denies mucositis or sore throat Respiratory: Denies cough, dyspnea or wheezes Cardiovascular: Denies palpitation, chest discomfort Gastrointestinal:  Denies nausea, heartburn or change in bowel habits Skin: Denies abnormal skin rashes Lymphatics: Denies new lymphadenopathy or easy bruising Neurological:Denies numbness, tingling or new weaknesses Behavioral/Psych: Mood is stable, no new changes  All other systems were reviewed with the patient and are negative.  PHYSICAL EXAMINATION: ECOG PERFORMANCE STATUS: 1 - Symptomatic but completely ambulatory  Vitals:   06/08/23 1020  BP: (!) 160/93   Pulse: 99  SpO2: 92%   Filed Weights   06/08/23 1020  Weight: 296 lb 9.6 oz (134.5 kg)    GENERAL:alert, no distress and comfortable.  Limited examination due to body habitus.  Poor personal hygiene SKIN: skin color, texture, turgor are normal, no rashes or significant lesions EYES: normal, conjunctiva are pink and non-injected, sclera clear OROPHARYNX:no exudate, no erythema and lips, buccal mucosa, and tongue normal.  Poor dentition NECK: supple, thyroid normal size, non-tender, without nodularity LYMPH:  no palpable lymphadenopathy in the cervical, axillary or inguinal LUNGS: clear to auscultation and percussion with normal breathing effort HEART: regular rate & rhythm and no murmurs with moderate bilateral lower extremity edema ABDOMEN:abdomen soft, non-tender and normal bowel sounds Musculoskeletal:no cyanosis of digits and no clubbing  PSYCH: alert & oriented x 3 with fluent speech NEURO: no focal motor/sensory deficits  LABORATORY DATA:  I have reviewed the data as listed Lab Results  Component Value Date   WBC 7.2 06/08/2023   HGB 20.9 (H) 06/08/2023  HCT 62.7 (H) 06/08/2023   MCV 106.3 (H) 06/08/2023   PLT PENDING 06/08/2023

## 2023-06-08 NOTE — Assessment & Plan Note (Signed)
The patient has suboptimal control of his diabetes, hypertension and has significant cardiovascular risk factors including tobacco abuse He is noted to have poor wound healing on his lower legs He will continue follow-up with his primary care doctor for management

## 2023-06-09 ENCOUNTER — Emergency Department (HOSPITAL_COMMUNITY): Payer: PPO

## 2023-06-09 ENCOUNTER — Other Ambulatory Visit: Payer: Self-pay

## 2023-06-09 ENCOUNTER — Inpatient Hospital Stay: Payer: PPO

## 2023-06-09 ENCOUNTER — Encounter (HOSPITAL_COMMUNITY): Payer: Self-pay

## 2023-06-09 ENCOUNTER — Observation Stay (HOSPITAL_COMMUNITY)
Admission: EM | Admit: 2023-06-09 | Discharge: 2023-06-10 | Disposition: A | Discharge status: Home / Self Care | Payer: PPO | Attending: Family Medicine | Admitting: Family Medicine

## 2023-06-09 VITALS — BP 110/60 | HR 35 | Resp 20

## 2023-06-09 DIAGNOSIS — I493 Ventricular premature depolarization: Secondary | ICD-10-CM | POA: Insufficient documentation

## 2023-06-09 DIAGNOSIS — D75839 Thrombocytosis, unspecified: Secondary | ICD-10-CM | POA: Diagnosis not present

## 2023-06-09 DIAGNOSIS — F17210 Nicotine dependence, cigarettes, uncomplicated: Secondary | ICD-10-CM | POA: Diagnosis not present

## 2023-06-09 DIAGNOSIS — E1140 Type 2 diabetes mellitus with diabetic neuropathy, unspecified: Secondary | ICD-10-CM | POA: Insufficient documentation

## 2023-06-09 DIAGNOSIS — J9601 Acute respiratory failure with hypoxia: Secondary | ICD-10-CM | POA: Diagnosis not present

## 2023-06-09 DIAGNOSIS — Z79899 Other long term (current) drug therapy: Secondary | ICD-10-CM | POA: Diagnosis not present

## 2023-06-09 DIAGNOSIS — Z96642 Presence of left artificial hip joint: Secondary | ICD-10-CM | POA: Diagnosis not present

## 2023-06-09 DIAGNOSIS — R001 Bradycardia, unspecified: Secondary | ICD-10-CM

## 2023-06-09 DIAGNOSIS — J189 Pneumonia, unspecified organism: Secondary | ICD-10-CM | POA: Diagnosis not present

## 2023-06-09 DIAGNOSIS — J9611 Chronic respiratory failure with hypoxia: Principal | ICD-10-CM | POA: Insufficient documentation

## 2023-06-09 DIAGNOSIS — Z1152 Encounter for screening for COVID-19: Secondary | ICD-10-CM | POA: Diagnosis not present

## 2023-06-09 DIAGNOSIS — Z8673 Personal history of transient ischemic attack (TIA), and cerebral infarction without residual deficits: Secondary | ICD-10-CM | POA: Diagnosis not present

## 2023-06-09 DIAGNOSIS — Z794 Long term (current) use of insulin: Secondary | ICD-10-CM | POA: Insufficient documentation

## 2023-06-09 DIAGNOSIS — Z6841 Body Mass Index (BMI) 40.0 and over, adult: Secondary | ICD-10-CM | POA: Insufficient documentation

## 2023-06-09 DIAGNOSIS — R0902 Hypoxemia: Secondary | ICD-10-CM | POA: Diagnosis present

## 2023-06-09 DIAGNOSIS — I10 Essential (primary) hypertension: Secondary | ICD-10-CM | POA: Diagnosis not present

## 2023-06-09 DIAGNOSIS — D751 Secondary polycythemia: Secondary | ICD-10-CM

## 2023-06-09 DIAGNOSIS — E66813 Obesity, class 3: Secondary | ICD-10-CM | POA: Insufficient documentation

## 2023-06-09 DIAGNOSIS — Z7982 Long term (current) use of aspirin: Secondary | ICD-10-CM | POA: Insufficient documentation

## 2023-06-09 LAB — HEMOGLOBIN A1C
Hgb A1c MFr Bld: 6.7 % — ABNORMAL HIGH (ref 4.8–5.6)
Mean Plasma Glucose: 145.59 mg/dL

## 2023-06-09 LAB — CBC
HCT: 61.5 % — ABNORMAL HIGH (ref 39.0–52.0)
HCT: 65 % — ABNORMAL HIGH (ref 39.0–52.0)
Hemoglobin: 20.5 g/dL — ABNORMAL HIGH (ref 13.0–17.0)
Hemoglobin: 20.9 g/dL — ABNORMAL HIGH (ref 13.0–17.0)
MCH: 35.9 pg — ABNORMAL HIGH (ref 26.0–34.0)
MCH: 35.6 pg — ABNORMAL HIGH (ref 26.0–34.0)
MCHC: 33.3 g/dL (ref 30.0–36.0)
MCHC: 32.2 g/dL (ref 30.0–36.0)
MCV: 107.7 fL — ABNORMAL HIGH (ref 80.0–100.0)
MCV: 110.7 fL — ABNORMAL HIGH (ref 80.0–100.0)
Platelets: 144 10*3/uL — ABNORMAL LOW (ref 150–400)
Platelets: 144 10*3/uL — ABNORMAL LOW (ref 150–400)
RBC: 5.71 MIL/uL (ref 4.22–5.81)
RBC: 5.87 MIL/uL — ABNORMAL HIGH (ref 4.22–5.81)
RDW: 14.1 % (ref 11.5–15.5)
RDW: 14 % (ref 11.5–15.5)
WBC: 7.4 10*3/uL (ref 4.0–10.5)
WBC: 7.3 10*3/uL (ref 4.0–10.5)
nRBC: 0 % (ref 0.0–0.2)
nRBC: 0 % (ref 0.0–0.2)

## 2023-06-09 LAB — CREATININE, SERUM
Creatinine, Ser: 1.15 mg/dL (ref 0.61–1.24)
GFR, Estimated: >60 mL/min (ref >60)

## 2023-06-09 LAB — BASIC METABOLIC PANEL
Anion gap: 11 (ref 5–15)
BUN: 23 mg/dL (ref 8–23)
CO2: 32 mmol/L (ref 22–32)
Calcium: 9.1 mg/dL (ref 8.9–10.3)
Chloride: 100 mmol/L (ref 98–111)
Creatinine, Ser: 1.08 mg/dL (ref 0.61–1.24)
GFR, Estimated: >60 mL/min (ref >60)
Glucose, Bld: 111 mg/dL — ABNORMAL HIGH (ref 70–99)
Potassium: 4.3 mmol/L (ref 3.5–5.1)
Sodium: 143 mmol/L (ref 135–145)

## 2023-06-09 LAB — GLUCOSE, CAPILLARY
Glucose-Capillary: 133 mg/dL — ABNORMAL HIGH (ref 70–99)
Glucose-Capillary: 122 mg/dL — ABNORMAL HIGH (ref 70–99)

## 2023-06-09 LAB — SARS CORONAVIRUS 2 BY RT PCR: SARS Coronavirus 2 by RT PCR: NEGATIVE

## 2023-06-09 LAB — ERYTHROPOIETIN: Erythropoietin: 14 m[IU]/mL (ref 2.6–18.5)

## 2023-06-09 LAB — HIV ANTIBODY (ROUTINE TESTING W REFLEX): HIV Screen 4th Generation wRfx: NONREACTIVE

## 2023-06-09 LAB — TROPONIN I (HIGH SENSITIVITY): Troponin I (High Sensitivity): 7 ng/L (ref <18)

## 2023-06-09 LAB — MAGNESIUM: Magnesium: 2.4 mg/dL (ref 1.7–2.4)

## 2023-06-09 MED ORDER — ACETAMINOPHEN 325 MG PO TABS: 650.0000 mg | ORAL_TABLET | Freq: Four times a day (QID) | ORAL | Status: DC | PRN

## 2023-06-09 MED ORDER — TRAZODONE HCL 50 MG PO TABS
25.0000 mg | ORAL_TABLET | Freq: Every evening | ORAL | Status: DC | PRN
  Administered 2023-06-09: 25 mg via ORAL
  Filled 2023-06-09: qty 1

## 2023-06-09 MED ORDER — ONDANSETRON HCL 4 MG PO TABS: 4.0000 mg | ORAL_TABLET | Freq: Four times a day (QID) | ORAL | Status: DC | PRN

## 2023-06-09 MED ORDER — DOXYCYCLINE HYCLATE 100 MG PO TABS
100.0000 mg | ORAL_TABLET | Freq: Once | ORAL | Status: AC
  Administered 2023-06-09: 100 mg via ORAL
  Filled 2023-06-09: qty 1

## 2023-06-09 MED ORDER — INSULIN ASPART 100 UNIT/ML IJ SOLN
0.0000 [IU] | Freq: Every day | INTRAMUSCULAR | Status: DC
  Filled 2023-06-09: qty 0.05

## 2023-06-09 MED ORDER — SODIUM CHLORIDE 0.9 % IV SOLN: 500.0000 mg | INTRAVENOUS | Status: DC

## 2023-06-09 MED ORDER — ASPIRIN 325 MG PO TABS
325.0000 mg | ORAL_TABLET | Freq: Every day | ORAL | Status: DC
  Administered 2023-06-09 – 2023-06-10 (×2): 325 mg via ORAL
  Filled 2023-06-09 (×2): qty 1

## 2023-06-09 MED ORDER — METOPROLOL SUCCINATE ER 50 MG PO TB24
50.0000 mg | ORAL_TABLET | Freq: Every day | ORAL | Status: DC
  Administered 2023-06-10: 50 mg via ORAL
  Filled 2023-06-09: qty 1

## 2023-06-09 MED ORDER — SODIUM CHLORIDE 0.9 % IV SOLN: 1.0000 g | INTRAVENOUS | Status: DC

## 2023-06-09 MED ORDER — ACETAMINOPHEN 650 MG RE SUPP: 650.0000 mg | Freq: Four times a day (QID) | RECTAL | Status: DC | PRN

## 2023-06-09 MED ORDER — GABAPENTIN 300 MG PO CAPS
600.0000 mg | ORAL_CAPSULE | Freq: Two times a day (BID) | ORAL | Status: DC
  Administered 2023-06-09 – 2023-06-10 (×3): 600 mg via ORAL
  Filled 2023-06-09 (×3): qty 2

## 2023-06-09 MED ORDER — DAPAGLIFLOZIN PROPANEDIOL 10 MG PO TABS
10.0000 mg | ORAL_TABLET | Freq: Every day | ORAL | Status: DC
  Administered 2023-06-09 – 2023-06-10 (×2): 10 mg via ORAL
  Filled 2023-06-09 (×2): qty 1

## 2023-06-09 MED ORDER — ENOXAPARIN SODIUM 80 MG/0.8ML IJ SOSY
70.0000 mg | PREFILLED_SYRINGE | Freq: Every day | INTRAMUSCULAR | Status: DC
  Administered 2023-06-09 – 2023-06-10 (×2): 70 mg via SUBCUTANEOUS
  Filled 2023-06-09: qty 0.8
  Filled 2023-06-09: qty 0.7
  Filled 2023-06-09: qty 0.8

## 2023-06-09 MED ORDER — SODIUM CHLORIDE 0.9 % IV SOLN
1.0000 g | Freq: Once | INTRAVENOUS | Status: AC
  Administered 2023-06-09: 1 g via INTRAVENOUS
  Filled 2023-06-09: qty 10

## 2023-06-09 MED ORDER — INSULIN ASPART 100 UNIT/ML IJ SOLN
0.0000 [IU] | Freq: Three times a day (TID) | INTRAMUSCULAR | Status: DC
  Administered 2023-06-09 – 2023-06-10 (×2): 2 [IU] via SUBCUTANEOUS
  Filled 2023-06-09: qty 0.15

## 2023-06-09 MED ORDER — ATORVASTATIN CALCIUM 10 MG PO TABS
20.0000 mg | ORAL_TABLET | Freq: Every day | ORAL | Status: DC
  Administered 2023-06-09 – 2023-06-10 (×2): 20 mg via ORAL
  Filled 2023-06-09 (×2): qty 2

## 2023-06-09 MED ORDER — ONDANSETRON HCL 4 MG/2ML IJ SOLN: 4.0000 mg | Freq: Four times a day (QID) | INTRAMUSCULAR | Status: DC | PRN

## 2023-06-09 MED ORDER — ALBUTEROL SULFATE (2.5 MG/3ML) 0.083% IN NEBU: 2.5000 mg | INHALATION_SOLUTION | RESPIRATORY_TRACT | Status: DC | PRN

## 2023-06-09 NOTE — Progress Notes (Signed)
Pt transported to ED due to bradycardia and hypoxemia (see MAR). Pt on 2L O2 for transport.

## 2023-06-09 NOTE — ED Triage Notes (Signed)
Patient brought over from Cancer center due to low heart rate and low oxygen levels. Per RN from cancer center, patient went to have a therapeutic phlebotomy blood draw due to hgb being over 20. Patient does not wear oxygen at home, but today had an O2 of 87%. Nurse noted his heart rate to be between 58-36 today, patient does not have any history of this. Patient does not have any complaints.

## 2023-06-09 NOTE — ED Provider Notes (Signed)
[place] EMERGENCY DEPARTMENT AT [facility] Provider Note   CSN: [id number] Arrival date & time: 06/09/23  4098     History  Chief Complaint  Patient presents with   Bradycardia    Dennis Wyatt is a 62 y.o. male from the cancer center with concern for potential bradycardia as well as hypoxia.  Patient reports he is a chronic lifelong smoker and smokes "about a pack of cigarettes a day".  Patient is therapeutic lobotomy at the cancer center performed.  Today there was concern his heart rate was "between 58 and 36".  The patient has no acute complaints.  He denies chest pain, denies any significant shortness of breath.  He feels at his baseline level.  Review of his medical records, the patient was hospitalized in June of last year, with an elevated troponin that was likely demand ischemia in the setting of severe dehydration and AKI and rhabdomyolysis.  He was noted to have frequent PVCs with hypomagnesemia and hypokalemia at that time.    HPI     Home Medications Prior to Admission medications   Medication Sig Start Date End Date Taking? Authorizing Provider  aspirin 325 MG tablet Take 325 mg by mouth daily.   Yes [provider]  atorvastatin (LIPITOR) 20 MG tablet Take 20 mg by mouth daily.   Yes [provider]  Dulaglutide (TRULICITY) 0.75 MG/0.5ML SOPN Inject 0.75 mg into the skin once a week.   Yes [provider]  FARXIGA 10 MG TABS tablet Take 10 mg by mouth daily. 05/16/23  Yes [provider]  gabapentin (NEURONTIN) 600 MG tablet Take 600 mg by mouth 2 (two) times daily. 09/12/21  Yes [provider]  metoprolol succinate (TOPROL-XL) 50 MG 24 hr tablet Take 50 mg by mouth daily. 12/25/22  Yes [provider]  traZODone (DESYREL) 100 MG tablet Take 100 mg by mouth at bedtime as needed for sleep. 11/28/21  Yes [provider]  lisinopril-hydrochlorothiazide (ZESTORETIC) 10-12.5 MG tablet Take 1 tablet  by mouth daily. Patient not taking: Reported on 06/09/2023 09/12/21   [provider]      Allergies    Oxycodone    Review of Systems   Review of Systems  Physical Exam Updated Vital Signs BP 123/87 (BP Location: Left Arm)   Pulse (!) 57   Temp 98.7 F (37.1 C) (Oral)   Resp 17   Ht 6' (1.829 m)   Wt 134.5 kg   SpO2 91%   BMI 40.22 kg/m  Physical Exam Constitutional:      General: He is not in acute distress.    Appearance: He is obese.  HENT:     Head: Normocephalic and atraumatic.  Eyes:     Conjunctiva/sclera: Conjunctivae normal.     Pupils: Pupils are equal, round, and reactive to light.  Cardiovascular:     Rate and Rhythm: Normal rate and regular rhythm.     Comments: Frequent PVC Pulmonary:     Effort: Pulmonary effort is normal. No respiratory distress.     Comments: 95% on 2L [place] Abdominal:     General: There is no distension.     Tenderness: There is no abdominal tenderness.  Skin:    General: Skin is warm and dry.  Neurological:     General: No focal deficit present.     Mental Status: He is alert. Mental status is at baseline.  Psychiatric:        Mood and Affect:  Mood normal.        Behavior: Behavior normal.     ED Results / Procedures / Treatments   Labs (all labs ordered are listed, but only abnormal results are displayed) Labs Reviewed  CBC - Abnormal; Notable for the following components:      Result Value   Hemoglobin 20.5 (*)    HCT 61.5 (*)    MCV 107.7 (*)    MCH 35.9 (*)    Platelets 144 (*)    All other components within normal limits  BASIC METABOLIC PANEL - Abnormal; Notable for the following components:   Glucose, Bld 111 (*)    All other components within normal limits  CBC - Abnormal; Notable for the following components:   RBC 5.87 (*)    Hemoglobin 20.9 (*)    HCT 65.0 (*)    MCV 110.7 (*)    MCH 35.6 (*)    Platelets 144 (*)    All other components within normal limits  HEMOGLOBIN A1C - Abnormal; Notable  for the following components:   Hgb A1c MFr Bld 6.7 (*)    All other components within normal limits  GLUCOSE, CAPILLARY - Abnormal; Notable for the following components:   Glucose-Capillary 133 (*)    All other components within normal limits  SARS CORONAVIRUS 2 BY RT PCR  MAGNESIUM  HIV ANTIBODY (ROUTINE TESTING W REFLEX)  CREATININE, SERUM  BASIC METABOLIC PANEL  CBC  TROPONIN I (HIGH SENSITIVITY)    EKG EKG Interpretation Date/Time:  Tuesday June 09 2023 08:13:29 EST Ventricular Rate:  60 PR Interval:  166 QRS Duration:  115 QT Interval:  399 QTC Calculation: 399 R Axis:   262  Text Interpretation: Sinus rhythm Ventricular premature complex Left anterior fascicular block Confirmed by Alvester Chou [id number]) on 06/09/2023 10:35:27 AM  Radiology DG Chest 2 View  Result Date: 06/09/2023 CLINICAL DATA:  Hypoxia and bradycardia EXAM: CHEST - 2 VIEW COMPARISON:  Chest radiograph dated 12/23/2021 FINDINGS: Normal lung volumes. Bibasilar patchy opacities. No pleural effusion or pneumothorax. The heart size and mediastinal contours are within normal limits. No acute osseous abnormality. IMPRESSION: Bibasilar patchy opacities, which may represent atelectasis, aspiration, or pneumonia. Electronically Signed   By: Agustin Cree M.D.   On: 06/09/2023 09:18    Procedures Procedures    Medications Ordered in ED Medications  aspirin tablet 325 mg (325 mg Oral Given 06/09/23 1558)  atorvastatin (LIPITOR) tablet 20 mg (20 mg Oral Given 06/09/23 1557)  metoprolol succinate (TOPROL-XL) 24 hr tablet 50 mg (has no administration in time range)  gabapentin (NEURONTIN) capsule 600 mg (600 mg Oral Given 06/09/23 1557)  enoxaparin (LOVENOX) injection 70 mg (70 mg Subcutaneous Given 06/09/23 1558)  insulin aspart (novoLOG) injection 0-15 Units (2 Units Subcutaneous Given 06/09/23 1716)  insulin aspart (novoLOG) injection 0-5 Units (has no administration in time range)  acetaminophen (TYLENOL)  tablet 650 mg (has no administration in time range)    Or  acetaminophen (TYLENOL) suppository 650 mg (has no administration in time range)  traZODone (DESYREL) tablet 25 mg (has no administration in time range)  ondansetron (ZOFRAN) tablet 4 mg (has no administration in time range)    Or  ondansetron (ZOFRAN) injection 4 mg (has no administration in time range)  albuterol (PROVENTIL) (2.5 MG/3ML) 0.083% nebulizer solution 2.5 mg (has no administration in time range)  dapagliflozin propanediol (FARXIGA) tablet 10 mg (10 mg Oral Given 06/09/23 1716)  cefTRIAXone (ROCEPHIN) 1 g in sodium chloride 0.9 % 100  mL IVPB (0 g Intravenous Stopped 06/09/23 1111)  doxycycline (VIBRA-TABS) tablet 100 mg (100 mg Oral Given 06/09/23 1041)    ED Course/ Medical Decision Making/ A&P Clinical Course as of 06/09/23 2003  Tue Jun 09, 2023  0859 Telemetry monitor showing a borderline sinus bradycardia with heart rate of 60 to 70 bpm at baseline but occasional ventricular bigeminy or PVC which is erroneously noted to be HR 30 by telemetry - questioning machine error as a cause of reported bradycardia this morning? We'll continue monitoring on tele [MT]  1102 Awaiting BMP redraw  [MT]    Clinical Course User Index [MT] Sherrill Buikema, Kermit Balo, MD                                 Medical Decision Making Amount and/or Complexity of Data Reviewed Labs: ordered. Radiology: ordered. ECG/medicine tests: ordered.  Risk Prescription drug management. Decision regarding hospitalization.   This patient presents to the ED with concern for bradycardia, potential hypoxic. This involves an extensive number of treatment options, and is a complaint that carries with it a high risk of complications and morbidity.  The differential diagnosis includes asymptomatic bradycardia versus sinus arrhythmia versus ventricular bigeminy versus other  Hypoxia appears "borderline" in 88%-90% range.  This may not be atypical given his degree  of smoking and likely related emphysema or COPD.  He is not audibly wheezing or in respiratory distress on exam to suggest a COPD exacerbation.  However x-ray imaging has been ordered to evaluate for fluid on the lungs, pneumonia, or other causes of potential hypoxia.  Low suspicion for acute PE in this clinical setting.  Co-morbidities that complicate the patient evaluation: Smoking history as noted above  External records from outside source obtained and reviewed including oncology center office note evaluation, most recent discharge summary from June of last  I ordered and personally interpreted labs.  The pertinent results include:  no emergent findings  I ordered imaging studies including x-ray of the chest I independently visualized and interpreted imaging which showed potential infiltrate I agree with the radiologist interpretation  The patient was maintained on a cardiac monitor.  I personally viewed and interpreted the cardiac monitored which showed an underlying rhythm of: sinus bradycardia and sinus rhythm, HR 50-60 bpm, occasional brief ventricular bigeminy  Per my interpretation the patient's ECG shows sinus rhythm borderline bradycardia, no acute ischemic findings  I ordered medication including rocephin and doxycycline for CAP  I have reviewed the patients home medicines and have made adjustments as needed  Test Considered: doubt acute PE in this setting   After the interventions noted above, I reevaluated the patient and found that they have: stayed the same  Social Determinants of Health:counselled on importance of smoking cessation   Dispostion:  After consideration of the diagnostic results and the patients response to treatment, I feel that the patent would benefit from medical admission for treatment for PNA and hypoxia.  He may benefit from home O2 if this is more chronic smoking related hypoxia.  Stable on admission - low suspicion for sepsis  Hospitalist  made aware that bradycardia appears to be a monitor misreading of ventricular bigeminy         Final Clinical Impression(s) / ED Diagnoses Final diagnoses:  Pneumonia due to infectious organism, unspecified laterality, unspecified part of lung  Hypoxia  Bradycardia    Rx / DC Orders ED Discharge Orders  None         Terald Sleeper, MD 06/09/23 2005

## 2023-06-09 NOTE — ED Notes (Signed)
ED TO INPATIENT HANDOFF REPORT  Name/Age/Gender Dennis Wyatt 62 y.o. male  Code Status    Code Status Orders  (From admission, onward)           Start     Ordered   06/09/23 1232  Full code  Continuous       Question:  By:  Answer:  Consent: discussion documented in EHR   06/09/23 1232           Code Status History     Date Active Date Inactive Code Status Order ID Comments User Context   12/23/2021 2348 12/26/2021 2329 Full Code [id number]  Therisa Doyne, MD Inpatient   08/06/2016 1146 08/08/2016 1658 Full Code [id number]  Ollen Gross, MD Inpatient   12/17/2012 1547 01/01/2013 1142 Full Code [id number]  Charlton Amor, PA-C Inpatient   12/15/2012 0158 12/17/2012 1547 Full Code [id number]  Storm Frisk, MD Inpatient       Home/SNF/Other Home  Chief Complaint Pneumonia [J18.9]  Level of Care/Admitting Diagnosis ED Disposition     ED Disposition  Admit   Condition  --   Comment  Hospital [facility]: [facility] [[id number]]  Level of Care: Med-Surg [16]  May place patient in observation at [facility] or [facility] if equivalent level of care is available:: Yes  Covid Evaluation: Asymptomatic - no recent exposure (last 10 days) testing not required  Diagnosis: Pneumonia [227785]  Admitting Physician: Maryln Gottron [[id number]]  Attending Physician: Kirby Crigler, MIR [place] [[id number]]          Medical History Past Medical History:  Diagnosis Date   Arthritis    Clotting disorder (HCC)    Diabetes mellitus without complication (HCC)    GERD (gastroesophageal reflux disease)    Headache    Hypertension    Stroke (HCC) 11/2012   Tooth pain    uses BC powders for teeth pain    Allergies Allergies  Allergen Reactions   Oxycodone     itch    IV Location/Drains/Wounds Patient Lines/Drains/Airways Status     Active Line/Drains/Airways     Name Placement date Placement time Site Days   Peripheral IV 06/09/23 20 G Posterior;Right  Hand 06/09/23  0924  Hand  less than 1   Incision 12/03/12 Groin Right 12/03/12  2008  -- 3840   Incision 12/17/12 Head Right 12/17/12  1700  -- 3826   Incision (Closed) 08/06/16 Hip Left 08/06/16  0810  -- 2498   Pressure Injury 12/24/21 Buttocks Left Deep Tissue Pressure Injury - Purple or maroon localized area of discolored intact skin or blood-filled blister due to damage of underlying soft tissue from pressure and/or shear. 4x6 cm maroon nonblanchable area 12/24/21  0005  -- 532   Wound 12/11/12 Abrasion(s) Arm Right abrasions on is Right forearm 12/11/12  0800  Arm  3832            Labs/Imaging Results for orders placed or performed during the hospital encounter of 06/09/23 (from the past 48 hour(s))  CBC     Status: Abnormal   Collection Time: 06/09/23  9:10 AM  Result Value Ref Range   WBC 7.4 4.0 - 10.5 K/uL   RBC 5.71 4.22 - 5.81 MIL/uL   Hemoglobin 20.5 (H) 13.0 - 17.0 g/dL   HCT 27.0 (H) 62.3 - 76.2 %   MCV 107.7 (H) 80.0 - 100.0 fL   MCH 35.9 (H) 26.0 - 34.0 pg   MCHC 33.3 30.0 - 36.0 g/dL  RDW 14.1 11.5 - 15.5 %   Platelets 144 (L) 150 - 400 K/uL   nRBC 0.0 0.0 - 0.2 %    Comment: Performed at [facility], [address], [place], [place] [address]  SARS Coronavirus 2 by RT PCR (hospital order, performed in [facility] hospital lab) *cepheid single result test* Anterior Nasal Swab     Status: None   Collection Time: 06/09/23  9:10 AM   Specimen: Anterior Nasal Swab  Result Value Ref Range   SARS Coronavirus 2 by RT PCR NEGATIVE NEGATIVE    Comment: (NOTE) SARS-CoV-2 target nucleic acids are NOT DETECTED.  The SARS-CoV-2 RNA is generally detectable in upper and lower respiratory specimens during the acute phase of infection. The lowest concentration of SARS-CoV-2 viral copies this assay can detect is 250 copies / mL. A negative result does not preclude SARS-CoV-2 infection and should not be used as the sole basis for treatment or  other patient management decisions.  A negative result may occur with improper specimen collection / handling, submission of specimen other than nasopharyngeal swab, presence of viral mutation(s) within the areas targeted by this assay, and inadequate number of viral copies (<250 copies / mL). A negative result must be combined with clinical observations, patient history, and epidemiological information.  Fact Sheet for Patients:   RoadLapTop.co.za  Fact Sheet for Healthcare Providers: http://kim-miller.com/  This test is not yet approved or  cleared by the [place] FDA and has been authorized for detection and/or diagnosis of SARS-CoV-2 by FDA under an Emergency Use Authorization (EUA).  This EUA will remain in effect (meaning this test can be used) for the duration of the COVID-19 declaration under Section 564(b)(1) of the Act, 21 U.S.C. section 360bbb-3(b)(1), unless the authorization is terminated or revoked sooner.  Performed at [facility], [address] [place], [place] [address]   Basic metabolic panel     Status: Abnormal   Collection Time: 06/09/23 11:00 AM  Result Value Ref Range   Sodium 143 135 - 145 mmol/L   Potassium 4.3 3.5 - 5.1 mmol/L   Chloride 100 98 - 111 mmol/L   CO2 32 22 - 32 mmol/L   Glucose, Bld 111 (H) 70 - 99 mg/dL    Comment: Glucose reference range applies only to samples taken after fasting for at least 8 hours.   BUN 23 8 - 23 mg/dL   Creatinine, Ser 3.55 0.61 - 1.24 mg/dL   Calcium 9.1 8.9 - 73.2 mg/dL   GFR, Estimated >20 >25 mL/min    Comment: (NOTE) Calculated using the CKD-EPI Creatinine Equation (2021)    Anion gap 11 5 - 15    Comment: Performed at [facility], [address], [place], [place] [address]  Magnesium     Status: None   Collection Time: 06/09/23 11:00 AM  Result Value Ref Range   Magnesium 2.4 1.7 - 2.4 mg/dL    Comment:  Performed at [facility], [address], [place], [place] [address]  Troponin I (High Sensitivity)     Status: None   Collection Time: 06/09/23 11:00 AM  Result Value Ref Range   Troponin I (High Sensitivity) 7 <18 ng/L    Comment: (NOTE) Elevated high sensitivity troponin I (hsTnI) values and significant  changes across serial measurements may suggest ACS but many other  chronic and acute conditions are known to elevate hsTnI results.  Refer to the "Links" section for chest pain  algorithms and additional  guidance. Performed at [facility], [address] [place], [place] [address]    DG Chest 2 View  Result Date: 06/09/2023 CLINICAL DATA:  Hypoxia and bradycardia EXAM: CHEST - 2 VIEW COMPARISON:  Chest radiograph dated 12/23/2021 FINDINGS: Normal lung volumes. Bibasilar patchy opacities. No pleural effusion or pneumothorax. The heart size and mediastinal contours are within normal limits. No acute osseous abnormality. IMPRESSION: Bibasilar patchy opacities, which may represent atelectasis, aspiration, or pneumonia. Electronically Signed   By: Agustin Cree M.D.   On: 06/09/2023 09:18    Pending Labs Unresulted Labs (From admission, onward)     Start     Ordered   06/16/23 0500  Creatinine, serum  (enoxaparin (LOVENOX)    CrCl >/= 30 ml/min)  Weekly,   R     Comments: while on enoxaparin therapy    06/09/23 1232   06/10/23 0500  Basic metabolic panel  Tomorrow morning,   R        06/09/23 1232   06/10/23 0500  CBC  Tomorrow morning,   R        06/09/23 1232   06/09/23 1231  HIV Antibody (routine testing w rflx)  (HIV Antibody (Routine testing w reflex) panel)  Once,   R        06/09/23 1232   06/09/23 1231  CBC  (enoxaparin (LOVENOX)    CrCl >/= 30 ml/min)  Once,   R       Comments: Baseline for enoxaparin therapy IF NOT ALREADY DRAWN.  Notify MD if PLT < 100 K.    06/09/23 1232   06/09/23 1231  Creatinine, serum  (enoxaparin (LOVENOX)     CrCl >/= 30 ml/min)  Once,   R       Comments: Baseline for enoxaparin therapy IF NOT ALREADY DRAWN.    06/09/23 1232   06/09/23 1231  Hemoglobin A1c  (Glycemic Control (SSI)  Q 4 Hours / Glycemic Control (SSI)  AC +/- HS)  Once,   R       Comments: To assess prior glycemic control    06/09/23 1232            Vitals/Pain Today's Vitals   06/09/23 0830 06/09/23 0845 06/09/23 1000 06/09/23 1117  BP:  (!) 86/67 105/64 107/60  Pulse: (!) 46 (!) 33 (!) 41 (!) 51  Resp: 12 15 17 19   Temp:    (!) 97.3 F (36.3 C)  TempSrc:    Oral  SpO2: 93% 95% 92% 92%  Weight:      Height:      PainSc:        Isolation Precautions No active isolations  Medications Medications  azithromycin (ZITHROMAX) 500 mg in sodium chloride 0.9 % 250 mL IVPB (has no administration in time range)  cefTRIAXone (ROCEPHIN) 1 g in sodium chloride 0.9 % 100 mL IVPB (has no administration in time range)  aspirin tablet 325 mg (has no administration in time range)  atorvastatin (LIPITOR) tablet 20 mg (has no administration in time range)  metoprolol succinate (TOPROL-XL) 24 hr tablet 50 mg (has no administration in time range)  gabapentin (NEURONTIN) tablet 600 mg (has no administration in time range)  enoxaparin (LOVENOX) injection 40 mg (has no administration in time range)  insulin aspart (novoLOG) injection 0-15 Units (has no administration in time range)  insulin aspart (novoLOG) injection 0-5 Units (has no administration in time range)  acetaminophen (TYLENOL) tablet 650 mg (has no administration  in time range)    Or  acetaminophen (TYLENOL) suppository 650 mg (has no administration in time range)  traZODone (DESYREL) tablet 25 mg (has no administration in time range)  ondansetron (ZOFRAN) tablet 4 mg (has no administration in time range)    Or  ondansetron (ZOFRAN) injection 4 mg (has no administration in time range)  albuterol (PROVENTIL) (2.5 MG/3ML) 0.083% nebulizer solution 2.5 mg (has no  administration in time range)  cefTRIAXone (ROCEPHIN) 1 g in sodium chloride 0.9 % 100 mL IVPB (0 g Intravenous Stopped 06/09/23 1111)  doxycycline (VIBRA-TABS) tablet 100 mg (100 mg Oral Given 06/09/23 1041)    Mobility walks

## 2023-06-09 NOTE — Plan of Care (Signed)
Problem: Education: Goal: Ability to describe self-care measures that may prevent or decrease complications (Diabetes Survival Skills Education) will improve 06/09/2023 1916 by Dorina Hoyer D, LPN Outcome: Progressing 06/09/2023 1916 by Dorina Hoyer D, LPN Outcome: Progressing Goal: Individualized Educational Video(s) 06/09/2023 1916 by Dorina Hoyer D, LPN Outcome: Progressing 06/09/2023 1916 by Dorina Hoyer D, LPN Outcome: Progressing   Problem: Coping: Goal: Ability to adjust to condition or change in health will improve 06/09/2023 1916 by Dorina Hoyer D, LPN Outcome: Progressing 06/09/2023 1916 by Dorina Hoyer D, LPN Outcome: Progressing   Problem: Fluid Volume: Goal: Ability to maintain a balanced intake and output will improve 06/09/2023 1916 by Dorina Hoyer D, LPN Outcome: Progressing 06/09/2023 1916 by Dorina Hoyer D, LPN Outcome: Progressing   Problem: Health Behavior/Discharge Planning: Goal: Ability to identify and utilize available resources and services will improve 06/09/2023 1916 by Dorina Hoyer D, LPN Outcome: Progressing 06/09/2023 1916 by Dorina Hoyer D, LPN Outcome: Progressing Goal: Ability to manage health-related needs will improve 06/09/2023 1916 by Dorina Hoyer D, LPN Outcome: Progressing 06/09/2023 1916 by Dorina Hoyer D, LPN Outcome: Progressing   Problem: Metabolic: Goal: Ability to maintain appropriate glucose levels will improve 06/09/2023 1916 by Dorina Hoyer D, LPN Outcome: Progressing 06/09/2023 1916 by Dorina Hoyer D, LPN Outcome: Progressing   Problem: Nutritional: Goal: Maintenance of adequate nutrition will improve 06/09/2023 1916 by Dorina Hoyer D, LPN Outcome: Progressing 06/09/2023 1916 by Dorina Hoyer D, LPN Outcome: Progressing Goal: Progress toward achieving an optimal weight will improve 06/09/2023 1916 by Dorina Hoyer D, LPN Outcome: Progressing 06/09/2023 1916 by Dorina Hoyer D,  LPN Outcome: Progressing   Problem: Skin Integrity: Goal: Risk for impaired skin integrity will decrease 06/09/2023 1916 by Dorina Hoyer D, LPN Outcome: Progressing 06/09/2023 1916 by Dorina Hoyer D, LPN Outcome: Progressing   Problem: Tissue Perfusion: Goal: Adequacy of tissue perfusion will improve 06/09/2023 1916 by Dorina Hoyer D, LPN Outcome: Progressing 06/09/2023 1916 by Dorina Hoyer D, LPN Outcome: Progressing   Problem: Education: Goal: Knowledge of General Education information will improve Description: Including pain rating scale, medication(s)/side effects and non-pharmacologic comfort measures 06/09/2023 1916 by Dorina Hoyer D, LPN Outcome: Progressing 06/09/2023 1916 by Dorina Hoyer D, LPN Outcome: Progressing   Problem: Health Behavior/Discharge Planning: Goal: Ability to manage health-related needs will improve 06/09/2023 1916 by Dorina Hoyer D, LPN Outcome: Progressing 06/09/2023 1916 by Dorina Hoyer D, LPN Outcome: Progressing   Problem: Clinical Measurements: Goal: Ability to maintain clinical measurements within normal limits will improve 06/09/2023 1916 by Dorina Hoyer D, LPN Outcome: Progressing 06/09/2023 1916 by Dorina Hoyer D, LPN Outcome: Progressing Goal: Will remain free from infection 06/09/2023 1916 by Dorina Hoyer D, LPN Outcome: Progressing 06/09/2023 1916 by Dorina Hoyer D, LPN Outcome: Progressing Goal: Diagnostic test results will improve 06/09/2023 1916 by Dorina Hoyer D, LPN Outcome: Progressing 06/09/2023 1916 by Dorina Hoyer D, LPN Outcome: Progressing Goal: Respiratory complications will improve 06/09/2023 1916 by Dorina Hoyer D, LPN Outcome: Progressing 06/09/2023 1916 by Dorina Hoyer D, LPN Outcome: Progressing Goal: Cardiovascular complication will be avoided 06/09/2023 1916 by Dorina Hoyer D, LPN Outcome: Progressing 06/09/2023 1916 by Dorina Hoyer D, LPN Outcome: Progressing    Problem: Activity: Goal: Risk for activity intolerance will decrease 06/09/2023 1916 by Dorina Hoyer D, LPN Outcome: Progressing 06/09/2023 1916 by Dorina Hoyer D, LPN Outcome: Progressing   Problem: Nutrition: Goal: Adequate nutrition will be maintained 06/09/2023 1916 by Dorina Hoyer D, LPN Outcome: Progressing 06/09/2023 1916 by Dorina Hoyer D, LPN Outcome: Progressing  Problem: Coping: Goal: Level of anxiety will decrease 06/09/2023 1916 by Dorina Hoyer D, LPN Outcome: Progressing 06/09/2023 1916 by Dorina Hoyer D, LPN Outcome: Progressing   Problem: Elimination: Goal: Will not experience complications related to bowel motility 06/09/2023 1916 by Dorina Hoyer D, LPN Outcome: Progressing 06/09/2023 1916 by Dorina Hoyer D, LPN Outcome: Progressing Goal: Will not experience complications related to urinary retention 06/09/2023 1916 by Dorina Hoyer D, LPN Outcome: Progressing 06/09/2023 1916 by Dorina Hoyer D, LPN Outcome: Progressing   Problem: Pain Management: Goal: General experience of comfort will improve 06/09/2023 1916 by Dorina Hoyer D, LPN Outcome: Progressing 06/09/2023 1916 by Dorina Hoyer D, LPN Outcome: Progressing   Problem: Safety: Goal: Ability to remain free from injury will improve 06/09/2023 1916 by Dorina Hoyer D, LPN Outcome: Progressing 06/09/2023 1916 by Dorina Hoyer D, LPN Outcome: Progressing   Problem: Skin Integrity: Goal: Risk for impaired skin integrity will decrease 06/09/2023 1916 by Dorina Hoyer D, LPN Outcome: Progressing 06/09/2023 1916 by Dorina Hoyer D, LPN Outcome: Progressing

## 2023-06-09 NOTE — H&P (Addendum)
History and Physical  Dennis Wyatt JYN:[id number] DOB: 14-Apr-1961 DOA: 06/09/2023  PCP: Bettey Costa, NP   Chief Complaint: Hypoxia  HPI: Dennis Wyatt is a 62 y.o. male with medical history significant for polycythemia, hypertension, obesity, non-insulin-dependent type 2 diabetes, tobacco abuse being admitted to the hospital with acute hypoxic respiratory failure.  He has been in his usual state of health, denies any lower extremity edema, fevers, cough, shortness of breath, nausea, vomiting.  He was seen yesterday in oncology clinic due to polycythemia, which they felt was due to his chronic tobacco abuse.  He also has thrombocytopenia which is stable, and likely due to fatty liver.  He came to oncology clinic today for therapeutic phlebotomy, was found to be bradycardic and hypoxic to 88% on room air and was sent to the ER for evaluation.  Reveals atelectasis versus pneumonia, hypoxia which is improved with 2 L nasal cannula oxygen.  Review of Systems: Please see HPI for pertinent positives and negatives. A complete 10 system review of systems are otherwise negative.  Past Medical History:  Diagnosis Date   Arthritis    Clotting disorder (HCC)    Diabetes mellitus without complication (HCC)    GERD (gastroesophageal reflux disease)    Headache    Hypertension    Stroke (HCC) 11/2012   Tooth pain    uses BC powders for teeth pain   Past Surgical History:  Procedure Laterality Date   RADIOLOGY WITH ANESTHESIA N/A 12/03/2012   Procedure: RADIOLOGY WITH ANESTHESIA;  Surgeon: Oneal Grout, MD;  Location: [facility] OR;  Service: Radiology;  Laterality: N/A;   TOTAL HIP ARTHROPLASTY Left 08/06/2016   Procedure: LEFT TOTAL HIP ARTHROPLASTY ANTERIOR APPROACH;  Surgeon: Ollen Gross, MD;  Location: [facility];  Service: Orthopedics;  Laterality: Left;    Social History:  reports that he has been smoking cigarettes. He has never used smokeless tobacco. He reports that he does not drink  alcohol and does not use drugs.   Allergies  Allergen Reactions   Oxycodone     itch    Family History  Problem Relation Age of Onset   Heart disease Mother    Stroke Mother    Cancer Father    Heart disease Father    Hypertension Father      Prior to Admission medications   Medication Sig Start Date End Date Taking? Authorizing Provider  aspirin 325 MG tablet Take 325 mg by mouth daily.    [provider]  atorvastatin (LIPITOR) 20 MG tablet Take 20 mg by mouth daily.    [provider]  Dulaglutide (TRULICITY) 0.75 MG/0.5ML SOPN Inject 0.75 mg into the skin once a week.    [provider]  FARXIGA 10 MG TABS tablet Take 10 mg by mouth daily. 05/16/23   [provider]  gabapentin (NEURONTIN) 600 MG tablet Take 600 mg by mouth 2 (two) times daily. 09/12/21   [provider]  lisinopril-hydrochlorothiazide (ZESTORETIC) 10-12.5 MG tablet Take 1 tablet by mouth daily. 09/12/21   [provider]  metoprolol succinate (TOPROL-XL) 50 MG 24 hr tablet Take 50 mg by mouth daily. 12/25/22   [provider]  traZODone (DESYREL) 100 MG tablet Take 100 mg by mouth at bedtime as needed for sleep. 11/28/21   [provider]    Physical Exam: BP 107/60   Pulse (!) 51   Temp (!) 97.3 F (36.3 C) (Oral)   Resp 19   Ht 6' (1.829 m)  Wt 134.5 kg   SpO2 92%   BMI 40.22 kg/m   General:  Alert, oriented, calm, in no acute distress, resting comfortably on 2 L, disheveled, morbidly obese. Eyes: EOMI, clear conjuctivae, white sclerea Neck: supple, no masses, trachea mildline  Cardiovascular: RRR, no murmurs or rubs, no peripheral edema  Respiratory: Breath sounds are distant but clear bilaterally, without active wheezing, rhonchi, crackles, tachypnea or respiratory distress. Abdomen: soft, nontender, obese/distended, normal bowel tones heard  Skin: dry, no rashes  Musculoskeletal: no joint effusions, normal range of motion   Psychiatric: appropriate affect, normal speech  Neurologic: extraocular muscles intact, clear speech, moving all extremities with intact sensorium         Labs on Admission:  Basic Metabolic Panel: Recent Labs  Lab 06/08/23 1031 06/09/23 1100  NA 139 143  K 4.4 4.3  CL 103 100  CO2 31 32  GLUCOSE 120* 111*  BUN 15 23  CREATININE 0.92 1.08  CALCIUM 9.2 9.1  MG  --  2.4   Liver Function Tests: Recent Labs  Lab 06/08/23 1031  AST 19  ALT 14  ALKPHOS 95  BILITOT 0.9  PROT 6.7  ALBUMIN 3.8   No results for input(s): "LIPASE", "AMYLASE" in the last 168 hours. No results for input(s): "AMMONIA" in the last 168 hours. CBC: Recent Labs  Lab 06/08/23 1031 06/09/23 0910  WBC 7.2 7.4  NEUTROABS 5.2  --   HGB 20.9* 20.5*  HCT 62.7* 61.5*  MCV 106.3* 107.7*  PLT 133* 144*   Cardiac Enzymes: No results for input(s): "CKTOTAL", "CKMB", "CKMBINDEX", "TROPONINI" in the last 168 hours.  BNP (last 3 results) No results for input(s): "BNP" in the last 8760 hours.  ProBNP (last 3 results) No results for input(s): "PROBNP" in the last 8760 hours.  CBG: No results for input(s): "GLUCAP" in the last 168 hours.  Radiological Exams on Admission: DG Chest 2 View  Result Date: 06/09/2023 CLINICAL DATA:  Hypoxia and bradycardia EXAM: CHEST - 2 VIEW COMPARISON:  Chest radiograph dated 12/23/2021 FINDINGS: Normal lung volumes. Bibasilar patchy opacities. No pleural effusion or pneumothorax. The heart size and mediastinal contours are within normal limits. No acute osseous abnormality. IMPRESSION: Bibasilar patchy opacities, which may represent atelectasis, aspiration, or pneumonia. Electronically Signed   By: Agustin Cree M.D.   On: 06/09/2023 09:18    Assessment/Plan Dennis Wyatt is a 62 y.o. male with medical history significant for polycythemia, hypertension, obesity, non-insulin-dependent type 2 diabetes, tobacco abuse being admitted to the hospital with acute hypoxic  respiratory failure.   Acute hypoxic respiratory failure-this actually may be chronic, given his body habitus, tobacco abuse, etc.  I suspect his hypoxia is a combination of undiagnosed emphysema/COPD, obesity hypoventilation, and likely some atelectasis.  He was given empiric IV antibiotics to cover for community-acquired pneumonia, however given lack of fevers, cough, leukocytosis my suspicion for acute bacterial pneumonia is very low.  No evidence of wheezing, dyspnea, or cough which would potentially signify COPD exacerbation. -Observation admission -Aggressive pulmonary toilet -Continue supplemental oxygen, wean as tolerated for goal O2 saturation greater than 90% -Will qualify for home oxygen in the morning -Recommend tobacco abuse cessation, and outpatient pulmonology consultation  Hypertension-continue Toprol-XL, with holding parameters  Thrombocytosis-scheduled for outpatient therapeutic phlebotomy, continue aspirin  Non-insulin-dependent type 2 diabetes-carb controlled diet, continue Farxiga, check hemoglobin A1c, moderate sliding scale  Peripheral neuropathy-Neurontin  Hyperlipidemia-Lipitor  Obesity-BMI 40.22, complicating all aspects of care  DVT prophylaxis: Lovenox     Code Status:  Full Code  Consults called: None  Admission status: Observation  Time spent: 56 minutes  Glenden Rossell Sharlette Dense MD [facility] Pager [phone]  If 7PM-7AM, please contact night-coverage [email or web address] Password [facility]  06/09/2023, 12:32 PM

## 2023-06-10 DIAGNOSIS — J9611 Chronic respiratory failure with hypoxia: Principal | ICD-10-CM

## 2023-06-10 LAB — BASIC METABOLIC PANEL
Anion gap: 8 (ref 5–15)
BUN: 30 mg/dL — ABNORMAL HIGH (ref 8–23)
CO2: 33 mmol/L — ABNORMAL HIGH (ref 22–32)
Calcium: 8.8 mg/dL — ABNORMAL LOW (ref 8.9–10.3)
Chloride: 100 mmol/L (ref 98–111)
Creatinine, Ser: 1.37 mg/dL — ABNORMAL HIGH (ref 0.61–1.24)
GFR, Estimated: 58 mL/min — ABNORMAL LOW (ref >60)
Glucose, Bld: 128 mg/dL — ABNORMAL HIGH (ref 70–99)
Potassium: 4.8 mmol/L (ref 3.5–5.1)
Sodium: 141 mmol/L (ref 135–145)

## 2023-06-10 LAB — CBC
HCT: 62.5 % — ABNORMAL HIGH (ref 39.0–52.0)
Hemoglobin: 20 g/dL — ABNORMAL HIGH (ref 13.0–17.0)
MCH: 35.8 pg — ABNORMAL HIGH (ref 26.0–34.0)
MCHC: 32 g/dL (ref 30.0–36.0)
MCV: 112 fL — ABNORMAL HIGH (ref 80.0–100.0)
Platelets: 131 10*3/uL — ABNORMAL LOW (ref 150–400)
RBC: 5.58 MIL/uL (ref 4.22–5.81)
RDW: 14 % (ref 11.5–15.5)
WBC: 7 10*3/uL (ref 4.0–10.5)
nRBC: 0 % (ref 0.0–0.2)

## 2023-06-10 LAB — GLUCOSE, CAPILLARY: Glucose-Capillary: 129 mg/dL — ABNORMAL HIGH (ref 70–99)

## 2023-06-10 NOTE — Progress Notes (Signed)
AVS reviewed w/ pt who verbalized an understanding - no other questions at this time - PIV removed as noted - Pt to car w/ O2 tank via w/c to car - pt drove self home

## 2023-06-10 NOTE — Discharge Summary (Signed)
Physician Discharge Summary   Patient: Dennis Wyatt MRN: [id number] DOB: June 22, 1961  Admit date:     06/09/2023  Discharge date: 06/10/23  Discharge Physician: Alberteen Sam   PCP: Bettey Costa, NP     Recommendations at discharge:  Follow up with PCP Randalyn Rhea for hypoxia Randalyn Rhea: Please refer for pulmonary function testing Please refer for sleep study Repeat CXR in 4 weeks, refer for CT chest if basilar opacities persist Wean oxygen if appropriate Follow up smoking cessation Monitor BP off metoprolol     Discharge Diagnoses: Principal Problem:   Chronic respiratory failure Additional hospital problems   Pneumonia ruled out   Bradycardia ruled out   Polycythemia   Hypertension   Class 3 obesity BMI 40.2   DIabetes   Smoking   Acute hypoxic respiratory failure ruled out    Hospital Course: 62 y.o. M with HTN, MO, DM, smoking and polycythemia who presented after being sent from Oncology clinic for bradycardia and hypoxia.  Patient presented for routine phlebotomy, was noted in intake to have bradycardia and SpO2 88% and so was sent next-door to the ER.     Chronic respiratory failure Acute respiratory failure ruled out Pneumonia ruled out In the ER, patient was noted to have SpO2 92 to 95% at rest, 88% with exertion.  Chest x-ray showed possible chronic bibasilar opacities.  He had no leukocytosis, nor symptoms of pneumonia, but was started empirically on antibiotics.  Antibiotics were not continued on admission, and the patient felt at baseline.  Review of chart shows he has chronic SpO2 low 90s at rest, and he has stigmata of chronic bronchitis (facial cyanosis, elevated bicarb).  Suspect he has chronic respiratory failure. - Recommend PFTs and PSG - Low threshold to obtain CT chest and refer to Pulmonology  - Doubt active pneumonia, return precautions given   Bradycardia ruled out Premature ventricular contractions Patient noted to have  bigeminy on monitor, no bradycardia.         The [facility] Controlled Substances Registry was reviewed for this patient prior to discharge.  Consultants: None Procedures performed: None  Disposition: Home Diet recommendation:  Discharge Diet Orders (From admission, onward)     Start     Ordered   06/10/23 0000  Diet - low sodium heart healthy        06/10/23 1037             DISCHARGE MEDICATION: Allergies as of 06/10/2023       Reactions   Oxycodone    itch        Medication List     STOP taking these medications    lisinopril-hydrochlorothiazide 10-12.5 MG tablet Commonly known as: ZESTORETIC   metoprolol succinate 50 MG 24 hr tablet Commonly known as: TOPROL-XL       TAKE these medications    aspirin 325 MG tablet Take 325 mg by mouth daily.   atorvastatin 20 MG tablet Commonly known as: LIPITOR Take 20 mg by mouth daily.   Farxiga 10 MG Tabs tablet Generic drug: dapagliflozin propanediol Take 10 mg by mouth daily.   gabapentin 600 MG tablet Commonly known as: NEURONTIN Take 600 mg by mouth 2 (two) times daily.   traZODone 100 MG tablet Commonly known as: DESYREL Take 100 mg by mouth at bedtime as needed for sleep.   Trulicity 0.75 MG/0.5ML Soaj Generic drug: Dulaglutide Inject 0.75 mg into the skin once a week.  Durable Medical Equipment  (From admission, onward)           Start     Ordered   06/10/23 0900  DME Oxygen  (Discharge Planning)  Once       Question Answer Comment  Length of Need Lifetime   Mode or (Route) Nasal cannula   Liters per Minute 2   Oxygen delivery system Gas      06/10/23 0859            Follow-up Information     Bettey Costa, NP Follow up.   Specialty: Nurse Practitioner Contact information: [address] [place] [address] [phone]                 Discharge Instructions     Diet - low sodium heart healthy   Complete by: As  directed    Discharge instructions   Complete by: As directed    **IMPORTANT DISCHARGE INSTRUCTIONS**   From Dr. Maryfrances Bunnell: You were observed overnight for low oxygen levels and concern for slow heart rate  What we found here was that the low oxygen levels appear to be long-standing for you  Buy yourself a pulse oximeter (this is a device to clip to the finger to measure oxygen levels) You can find it at any pharmacy  I recommend you use the oxygen at night and when walking around. If you are seated and not doing anything, and you can take the oxygen off and keep your oxygen level MORE than 88%, you don't have to wear the oxygen at rest  But if you are walking around, I would recommend you wear it  Go see your primary doctor in 1 week  Ask them for a referral to a lung specialist and for "PULMONARY FUNCTION TESTS"  STOP smoking  Also, stop your metoprolol blood pressure medicine Ask your primary doctor about this  You may resume your other home medicines  If you find you have fever, new cough, or worsening trouble breathing, call your primary doctor or come to the ER   Increase activity slowly   Complete by: As directed        Discharge Exam: Filed Weights   06/09/23 0807  Weight: 134.5 kg    General: Pt is alert, awake, not in acute distress Cardiovascular: RRR, nl S1-S2, no murmurs appreciated.   No LE edema.   Respiratory: Normal respiratory rate and rhythm.  CTAB without rales or wheezes. Abdominal: Abdomen soft and non-tender.  No distension or HSM.   Neuro/Psych: Strength symmetric in upper and lower extremities.  Judgment and insight appear normal.   Condition at discharge: fair  The results of significant diagnostics from this hospitalization (including imaging, microbiology, ancillary and laboratory) are listed below for reference.   Imaging Studies: DG Chest 2 View  Result Date: 06/09/2023 CLINICAL DATA:  Hypoxia and bradycardia EXAM: CHEST - 2 VIEW  COMPARISON:  Chest radiograph dated 12/23/2021 FINDINGS: Normal lung volumes. Bibasilar patchy opacities. No pleural effusion or pneumothorax. The heart size and mediastinal contours are within normal limits. No acute osseous abnormality. IMPRESSION: Bibasilar patchy opacities, which may represent atelectasis, aspiration, or pneumonia. Electronically Signed   By: Agustin Cree M.D.   On: 06/09/2023 09:18    Microbiology: Results for orders placed or performed during the hospital encounter of 06/09/23  SARS Coronavirus 2 by RT PCR (hospital order, performed in [facility] hospital lab) *cepheid single result test* Anterior Nasal Swab     Status:  None   Collection Time: 06/09/23  9:10 AM   Specimen: Anterior Nasal Swab  Result Value Ref Range Status   SARS Coronavirus 2 by RT PCR NEGATIVE NEGATIVE Final    Comment: (NOTE) SARS-CoV-2 target nucleic acids are NOT DETECTED.  The SARS-CoV-2 RNA is generally detectable in upper and lower respiratory specimens during the acute phase of infection. The lowest concentration of SARS-CoV-2 viral copies this assay can detect is 250 copies / mL. A negative result does not preclude SARS-CoV-2 infection and should not be used as the sole basis for treatment or other patient management decisions.  A negative result may occur with improper specimen collection / handling, submission of specimen other than nasopharyngeal swab, presence of viral mutation(s) within the areas targeted by this assay, and inadequate number of viral copies (<250 copies / mL). A negative result must be combined with clinical observations, patient history, and epidemiological information.  Fact Sheet for Patients:   RoadLapTop.co.za  Fact Sheet for Healthcare Providers: http://kim-miller.com/  This test is not yet approved or  cleared by the [place] FDA and has been authorized for detection and/or diagnosis of SARS-CoV-2 by FDA under  an Emergency Use Authorization (EUA).  This EUA will remain in effect (meaning this test can be used) for the duration of the COVID-19 declaration under Section 564(b)(1) of the Act, 21 U.S.C. section 360bbb-3(b)(1), unless the authorization is terminated or revoked sooner.  Performed at [facility], [address], [place], [place] [address]     Labs: CBC: Recent Labs  Lab 06/08/23 1031 06/09/23 0910 06/09/23 1336 06/10/23 0550  WBC 7.2 7.4 7.3 7.0  NEUTROABS 5.2  --   --   --   HGB 20.9* 20.5* 20.9* 20.0*  HCT 62.7* 61.5* 65.0* 62.5*  MCV 106.3* 107.7* 110.7* 112.0*  PLT 133* 144* 144* 131*   Basic Metabolic Panel: Recent Labs  Lab 06/08/23 1031 06/09/23 1100 06/09/23 1336 06/10/23 0550  NA 139 143  --  141  K 4.4 4.3  --  4.8  CL 103 100  --  100  CO2 31 32  --  33*  GLUCOSE 120* 111*  --  128*  BUN 15 23  --  30*  CREATININE 0.92 1.08 1.15 1.37*  CALCIUM 9.2 9.1  --  8.8*  MG  --  2.4  --   --    Liver Function Tests: Recent Labs  Lab 06/08/23 1031  AST 19  ALT 14  ALKPHOS 95  BILITOT 0.9  PROT 6.7  ALBUMIN 3.8   CBG: Recent Labs  Lab 06/09/23 1704 06/09/23 2120 06/10/23 0738  GLUCAP 133* 122* 129*    Discharge time spent: approximately 35 minutes spent on discharge counseling, evaluation of patient on day of discharge, and coordination of discharge planning with nursing, social work, pharmacy and case management  Signed: Alberteen Sam, MD [facility] 06/10/2023

## 2023-06-10 NOTE — Care Management Obs Status (Signed)
MEDICARE OBSERVATION STATUS NOTIFICATION   Patient Details  Name: Dennis Wyatt MRN: [id number] Date of Birth: 23-Dec-1960   Medicare Observation Status Notification Given:  Yes    Otelia Santee, LCSW 06/10/2023, 10:06 AM

## 2023-06-10 NOTE — Progress Notes (Signed)
SATURATION QUALIFICATIONS: (This note is used to comply with regulatory documentation for home oxygen)  Patient Saturations on Room Air at Rest = 94%  Patient Saturations on Room Air while Ambulating = 82%  Patient Saturations on 2 Liters of oxygen while Ambulating = 92%

## 2023-06-10 NOTE — Progress Notes (Signed)
Spiritual Care Note  Met with Mr Whittet per referral for assistance with Advance Directives. Provided AD forms and informational brochure, as well as introduction to content. He states that he wishes to speak with his close relatives and friends regarding Health Care Power of [place] before completing forms. He is aware of how to complete forms at a later time and of ongoing chaplain availability in case he would like further assistance while he is inpatient.    06/10/23 1100  Spiritual Encounters  Type of Visit Initial  Care provided to: Patient  Referral source Physician  Reason for visit Advance directives   Chaplain Hillery Aldo, [facility] pager [phone]

## 2023-06-10 NOTE — TOC Transition Note (Signed)
Transition of Care [facility]) - CM/SW Discharge Note   Patient Details  Name: Dennis Wyatt MRN: [id number] Date of Birth: January 29, 1961  Transition of Care [facility]) CM/SW Contact:  Otelia Santee, LCSW Phone Number: 06/10/2023, 10:25 AM   Clinical Narrative:    Pt to return home at discharge to self care. Pt has new O2 requirement and is agreeable for home O2 to be arranged. Referral sent to [facility] for home O2. [employer] to delivered portable O2 tank to pt's room prior to discharge and will deliver concentrator to pt's home.    Final next level of care: Home/Self Care Barriers to Discharge: No Barriers Identified   Patient Goals and CMS Choice CMS Medicare.gov Compare Post Acute Care list provided to:: Patient Choice offered to / list presented to : Patient  Discharge Placement                         Discharge Plan and Services Additional resources added to the After Visit Summary for                  DME Arranged: Oxygen DME Agency: Patsy Lager Date DME Agency Contacted: 06/10/23 Time DME Agency Contacted: 1025 Representative spoke with at DME Agency: Morrie Sheldon            Social Determinants of Health (SDOH) Interventions SDOH Screenings   Food Insecurity: No Food Insecurity (06/09/2023)  Housing: Low Risk  (06/09/2023)  Transportation Needs: No Transportation Needs (06/09/2023)  Utilities: Not At Risk (06/09/2023)  Tobacco Use: High Risk (06/09/2023)     Readmission Risk Interventions     No data to display

## 2023-06-15 LAB — JAK2 (INCLUDING V617F AND EXON 12), MPL,& CALR-NEXT GEN SEQ

## 2023-06-19 ENCOUNTER — Telehealth: Payer: Self-pay | Admitting: Hematology and Oncology

## 2023-06-19 NOTE — Telephone Encounter (Signed)
Patient has stated they wanted to cancel their appointment times/dates; patient is aware of cancelled appointment

## 2023-06-25 ENCOUNTER — Other Ambulatory Visit: Payer: PPO

## 2023-06-25 ENCOUNTER — Ambulatory Visit: Payer: PPO | Admitting: Hematology and Oncology

## 2023-12-26 ENCOUNTER — Other Ambulatory Visit: Payer: Self-pay

## 2023-12-26 ENCOUNTER — Encounter (HOSPITAL_COMMUNITY): Payer: Self-pay | Admitting: *Deleted

## 2023-12-26 ENCOUNTER — Emergency Department (HOSPITAL_COMMUNITY)
Admission: EM | Admit: 2023-12-26 | Discharge: 2023-12-26 | Disposition: A | Discharge status: Home / Self Care | Attending: Emergency Medicine | Admitting: Emergency Medicine

## 2023-12-26 DIAGNOSIS — Z7982 Long term (current) use of aspirin: Secondary | ICD-10-CM | POA: Insufficient documentation

## 2023-12-26 DIAGNOSIS — D751 Secondary polycythemia: Secondary | ICD-10-CM | POA: Insufficient documentation

## 2023-12-26 DIAGNOSIS — R718 Other abnormality of red blood cells: Secondary | ICD-10-CM | POA: Diagnosis present

## 2023-12-26 DIAGNOSIS — Z720 Tobacco use: Secondary | ICD-10-CM | POA: Insufficient documentation

## 2023-12-26 DIAGNOSIS — I10 Essential (primary) hypertension: Secondary | ICD-10-CM | POA: Diagnosis not present

## 2023-12-26 DIAGNOSIS — E119 Type 2 diabetes mellitus without complications: Secondary | ICD-10-CM | POA: Diagnosis not present

## 2023-12-26 LAB — BASIC METABOLIC PANEL WITH GFR
Anion gap: 9 (ref 5–15)
BUN: 15 mg/dL (ref 8–23)
CO2: 26 mmol/L (ref 22–32)
Calcium: 8.6 mg/dL — ABNORMAL LOW (ref 8.9–10.3)
Chloride: 108 mmol/L (ref 98–111)
Creatinine, Ser: 0.99 mg/dL (ref 0.61–1.24)
GFR, Estimated: >60 mL/min (ref >60)
Glucose, Bld: 94 mg/dL (ref 70–99)
Potassium: 4 mmol/L (ref 3.5–5.1)
Sodium: 143 mmol/L (ref 135–145)

## 2023-12-26 LAB — CBC WITH DIFFERENTIAL/PLATELET
Abs Immature Granulocytes: 0.02 10*3/uL (ref 0.00–0.07)
Basophils Absolute: 0.1 10*3/uL (ref 0.0–0.1)
Basophils Relative: 1 %
Eosinophils Absolute: 0.3 10*3/uL (ref 0.0–0.5)
Eosinophils Relative: 4 %
HCT: 57.8 % — ABNORMAL HIGH (ref 39.0–52.0)
Hemoglobin: 18.9 g/dL — ABNORMAL HIGH (ref 13.0–17.0)
Immature Granulocytes: 0 %
Lymphocytes Relative: 21 %
Lymphs Abs: 1.4 10*3/uL (ref 0.7–4.0)
MCH: 34.1 pg — ABNORMAL HIGH (ref 26.0–34.0)
MCHC: 32.7 g/dL (ref 30.0–36.0)
MCV: 104.3 fL — ABNORMAL HIGH (ref 80.0–100.0)
Monocytes Absolute: 0.8 10*3/uL (ref 0.1–1.0)
Monocytes Relative: 12 %
Neutro Abs: 4.1 10*3/uL (ref 1.7–7.7)
Neutrophils Relative %: 62 %
Platelets: 130 10*3/uL — ABNORMAL LOW (ref 150–400)
RBC: 5.54 MIL/uL (ref 4.22–5.81)
RDW: 12.9 % (ref 11.5–15.5)
WBC: 6.6 10*3/uL (ref 4.0–10.5)
nRBC: 0 % (ref 0.0–0.2)

## 2023-12-26 NOTE — ED Triage Notes (Signed)
 The pt was seen at [facility] medical yesterday  a doctor from there called the pt this am and told him to come to the ed and have blood removed ?????

## 2023-12-26 NOTE — ED Provider Notes (Signed)
 [place] EMERGENCY DEPARTMENT AT  [facility] Provider Note   CSN: [id number] Arrival date & time: 12/26/23  1519     Patient presents with: needs blood removed   Dennis Wyatt is a 63 y.o. male with a past medical history of SAH, HTN, DVT, tobacco use, T2DM, thrombocytopenia, hemochromatosis presents to the emergency department for evaluation of hemoglobin levels and concern for need for phlebotomy. PCP at [facility] medical advised patient should seek ED evaluation d/t elevated hgb. No complaints of headache, visual disturbances, dizziness  Was last seen by Dennis Wyatt who performed phlebotomy on 12/22/2023   HPI     Prior to Admission medications   Medication Sig Start Date End Date Taking? Authorizing Provider  aspirin  325 MG tablet Take 325 mg by mouth daily.    [provider]  atorvastatin  (LIPITOR) 20 MG tablet Take 20 mg by mouth daily.    [provider]  Dulaglutide (TRULICITY) 0.75 MG/0.5ML SOPN Inject 0.75 mg into the skin once a week.    [provider]  FARXIGA  10 MG TABS tablet Take 10 mg by mouth daily. 05/16/23   [provider]  gabapentin  (NEURONTIN ) 600 MG tablet Take 600 mg by mouth 2 (two) times daily. 09/12/21   [provider]  traZODone  (DESYREL ) 100 MG tablet Take 100 mg by mouth at bedtime as needed for sleep. 11/28/21   [provider]    Allergies: Oxycodone     Review of Systems  Constitutional:  Negative for chills, fatigue and fever.  Respiratory:  Negative for cough, chest tightness, shortness of breath and wheezing.   Cardiovascular:  Negative for chest pain and palpitations.  Gastrointestinal:  Negative for abdominal pain, constipation, diarrhea, nausea and vomiting.  Neurological:  Negative for dizziness, seizures, weakness, light-headedness, numbness and headaches.    Updated Vital Signs BP (!) 119/59 (BP Location: Right Arm)   Pulse (!) 56   Temp 99.1 F (37.3 C) (Oral)   Resp 20    Ht 6' (1.829 m)   Wt 134.5 kg   SpO2 95%   BMI 40.22 kg/m   Physical Exam Vitals and nursing note reviewed.  Constitutional:      General: He is not in acute distress.    Appearance: Normal appearance.  HENT:     Head: Normocephalic and atraumatic.   Eyes:     Conjunctiva/sclera: Conjunctivae normal.    Cardiovascular:     Rate and Rhythm: Normal rate.  Pulmonary:     Effort: Pulmonary effort is normal. No respiratory distress.   Skin:    Coloration: Skin is not jaundiced or pale.   Neurological:     Mental Status: He is alert and oriented to person, place, and time. Mental status is at baseline.     (all labs ordered are listed, but only abnormal results are displayed) Labs Reviewed  CBC WITH DIFFERENTIAL/PLATELET - Abnormal; Notable for the following components:      Result Value   Hemoglobin 18.9 (*)    HCT 57.8 (*)    MCV 104.3 (*)    MCH 34.1 (*)    Platelets 130 (*)    All other components within normal limits  BASIC METABOLIC PANEL WITH GFR - Abnormal; Notable for the following components:   Calcium  8.6 (*)    All other components within normal limits    EKG: None  Radiology: No results found.   Procedures   Medications Ordered in the ED - No data to display  Medical Decision Making Amount and/or Complexity of Data Reviewed Labs: ordered.   Patient presents to the ED for concern of elevated hgb, hct, this involves an extensive number of treatment options, and is a complaint that carries with it a high risk of complications and morbidity.  The differential diagnosis includes hemochromatosis, dehydration   Co morbidities that complicate the patient evaluation  See HPI   Additional history obtained:  Additional history obtained from Nursing and Outside Medical Records   External records from outside source obtained and reviewed including triage note, hematology note, most recent phlebotomy note   Lab  Tests:  I Ordered, and personally interpreted labs.  The pertinent results include:   Hgb 18.9 HCT 57.8 PLT 130     Consultations Obtained:  I requested consultation with hematology Dr. Arno Bibles,  and discussed lab and imaging findings as well as pertinent plan - they recommend:  Can draw up several large syringes to equal 350cc and dump it Infusion center closed on Sat and Sun so cannot have formal phlebotomy today F/U with Dennis Wyatt as he has had phlebotomy there before   Problem List / ED Course:  Polycythemia No complaints of dizziness, HA, CP, SHOB Hgb 18.9 today Last phlebotomy on 12/22/23 Patient becomes very agitated regarding going to ED and waiting 2.20hrs. He wishes to leave before I was able to speak with hematology. Fortunately, they did not recommend emergent phlebotomy and patient can follow up with Dennis Wyatt. Nursing removed patient's IV. I was unable to give him his DC paperwork as he did not want to wait for them to be printed either   Reevaluation:  After the interventions noted above, I reevaluated the patient and found that they have :stayed the same     Dispostion:  Patient left before I got in contact with hematogy. Fortunately, no emergent phlebotomy is needed. He is A&Ox3 and stable for DC.  Final diagnoses:  Polycythemia    ED Discharge Orders     None          Royann Cords, PA 12/26/23 1752    Roberts Ching, MD 12/26/23 Quin Brush

## 2023-12-26 NOTE — ED Notes (Signed)
 Pt standing up at bed, upset and wanting to leave. Pt states he's going to pull out his IV and it's time for me to go. PA-C notified and at bedside to speak with patient. Despite asking patient to stay for further workup, pt refused. Pt signed AMA paperwork.

## 2024-08-16 ENCOUNTER — Encounter (HOSPITAL_BASED_OUTPATIENT_CLINIC_OR_DEPARTMENT_OTHER): Payer: Self-pay | Admitting: Pulmonary Disease

## 2024-08-16 DIAGNOSIS — G4730 Sleep apnea, unspecified: Secondary | ICD-10-CM

## 2024-08-16 DIAGNOSIS — R5383 Other fatigue: Secondary | ICD-10-CM

## 2024-08-16 DIAGNOSIS — G4710 Hypersomnia, unspecified: Secondary | ICD-10-CM
